# Patient Record
Sex: Female | Born: 1960 | Race: White | Hispanic: No | Marital: Married | Smoking: Current every day smoker
Health system: Southern US, Community
[De-identification: ages and names within clinical notes are randomized; demographics above are authoritative.]

## PROBLEM LIST (undated history)

## (undated) ENCOUNTER — Emergency Department (HOSPITAL_COMMUNITY)

## (undated) DIAGNOSIS — M1990 Unspecified osteoarthritis, unspecified site: Secondary | ICD-10-CM

## (undated) DIAGNOSIS — I2510 Atherosclerotic heart disease of native coronary artery without angina pectoris: Secondary | ICD-10-CM

## (undated) DIAGNOSIS — J189 Pneumonia, unspecified organism: Secondary | ICD-10-CM

## (undated) DIAGNOSIS — K269 Duodenal ulcer, unspecified as acute or chronic, without hemorrhage or perforation: Secondary | ICD-10-CM

## (undated) DIAGNOSIS — I2699 Other pulmonary embolism without acute cor pulmonale: Secondary | ICD-10-CM

## (undated) DIAGNOSIS — I639 Cerebral infarction, unspecified: Secondary | ICD-10-CM

## (undated) DIAGNOSIS — M359 Systemic involvement of connective tissue, unspecified: Secondary | ICD-10-CM

## (undated) DIAGNOSIS — J449 Chronic obstructive pulmonary disease, unspecified: Secondary | ICD-10-CM

## (undated) DIAGNOSIS — I509 Heart failure, unspecified: Secondary | ICD-10-CM

## (undated) DIAGNOSIS — Z87442 Personal history of urinary calculi: Secondary | ICD-10-CM

## (undated) DIAGNOSIS — K5190 Ulcerative colitis, unspecified, without complications: Secondary | ICD-10-CM

## (undated) DIAGNOSIS — K449 Diaphragmatic hernia without obstruction or gangrene: Secondary | ICD-10-CM

## (undated) DIAGNOSIS — K219 Gastro-esophageal reflux disease without esophagitis: Secondary | ICD-10-CM

## (undated) DIAGNOSIS — R350 Frequency of micturition: Secondary | ICD-10-CM

## (undated) DIAGNOSIS — Z9889 Other specified postprocedural states: Secondary | ICD-10-CM

## (undated) HISTORY — DX: Personal history of urinary calculi: Z87.442

## (undated) HISTORY — DX: Cerebral infarction, unspecified: I63.9

## (undated) HISTORY — PX: APPENDECTOMY: SHX54

## (undated) HISTORY — DX: Diaphragmatic hernia without obstruction or gangrene: K44.9

## (undated) HISTORY — PX: CHOLECYSTECTOMY: SHX55

## (undated) HISTORY — PX: TONSILLECTOMY: SUR1361

## (undated) HISTORY — DX: Other specified postprocedural states: Z98.890

## (undated) HISTORY — DX: Other pulmonary embolism without acute cor pulmonale: I26.99

## (undated) HISTORY — PX: SPINE SURGERY: SHX786

## (undated) HISTORY — DX: Frequency of micturition: R35.0

## (undated) HISTORY — PX: TUBAL LIGATION: SHX77

## (undated) HISTORY — PX: ABDOMINAL HYSTERECTOMY: SHX81

## (undated) HISTORY — PX: OTHER SURGICAL HISTORY: SHX169

## (undated) HISTORY — DX: Atherosclerotic heart disease of native coronary artery without angina pectoris: I25.10

## (undated) HISTORY — DX: Duodenal ulcer, unspecified as acute or chronic, without hemorrhage or perforation: K26.9

---

## 1983-07-02 HISTORY — PX: ABDOMINAL HYSTERECTOMY: SHX81

## 1984-07-01 HISTORY — PX: TONSILLECTOMY: SUR1361

## 1990-07-01 DIAGNOSIS — I639 Cerebral infarction, unspecified: Secondary | ICD-10-CM

## 1990-07-01 HISTORY — DX: Cerebral infarction, unspecified: I63.9

## 1997-07-01 DIAGNOSIS — I2699 Other pulmonary embolism without acute cor pulmonale: Secondary | ICD-10-CM

## 1997-07-01 HISTORY — DX: Other pulmonary embolism without acute cor pulmonale: I26.99

## 1999-07-02 HISTORY — PX: CORONARY ANGIOPLASTY WITH STENT PLACEMENT: SHX49

## 2000-06-19 ENCOUNTER — Inpatient Hospital Stay (HOSPITAL_COMMUNITY): Admission: RE | Admit: 2000-06-19 | Discharge: 2000-06-20 | Payer: Self-pay | Admitting: Cardiology

## 2000-06-20 ENCOUNTER — Encounter: Payer: Self-pay | Admitting: Cardiology

## 2000-06-26 ENCOUNTER — Ambulatory Visit (HOSPITAL_COMMUNITY): Admission: RE | Admit: 2000-06-26 | Discharge: 2000-06-26 | Payer: Self-pay | Admitting: *Deleted

## 2003-07-02 HISTORY — PX: COLONOSCOPY: SHX174

## 2004-05-09 ENCOUNTER — Ambulatory Visit: Payer: Self-pay | Admitting: Urgent Care

## 2004-05-10 ENCOUNTER — Ambulatory Visit (HOSPITAL_COMMUNITY): Admission: RE | Admit: 2004-05-10 | Discharge: 2004-05-10 | Payer: Self-pay | Admitting: Internal Medicine

## 2004-05-16 ENCOUNTER — Ambulatory Visit (HOSPITAL_COMMUNITY): Admission: RE | Admit: 2004-05-16 | Discharge: 2004-05-16 | Payer: Self-pay | Admitting: Internal Medicine

## 2004-05-16 ENCOUNTER — Ambulatory Visit: Payer: Self-pay | Admitting: Internal Medicine

## 2004-08-07 ENCOUNTER — Ambulatory Visit: Payer: Self-pay | Admitting: Internal Medicine

## 2004-08-08 ENCOUNTER — Ambulatory Visit (HOSPITAL_COMMUNITY): Admission: RE | Admit: 2004-08-08 | Discharge: 2004-08-08 | Payer: Self-pay | Admitting: Internal Medicine

## 2004-08-21 ENCOUNTER — Ambulatory Visit (HOSPITAL_COMMUNITY): Admission: RE | Admit: 2004-08-21 | Discharge: 2004-08-21 | Payer: Self-pay | Admitting: Internal Medicine

## 2005-05-05 ENCOUNTER — Ambulatory Visit: Payer: Self-pay | Admitting: Cardiology

## 2005-05-05 ENCOUNTER — Inpatient Hospital Stay (HOSPITAL_COMMUNITY): Admission: AD | Admit: 2005-05-05 | Discharge: 2005-05-06 | Payer: Self-pay | Admitting: Cardiology

## 2006-01-29 HISTORY — PX: COLONOSCOPY: SHX174

## 2006-02-04 ENCOUNTER — Ambulatory Visit: Payer: Self-pay | Admitting: Internal Medicine

## 2006-02-10 ENCOUNTER — Ambulatory Visit (HOSPITAL_COMMUNITY): Admission: RE | Admit: 2006-02-10 | Discharge: 2006-02-10 | Payer: Self-pay | Admitting: Internal Medicine

## 2006-02-10 ENCOUNTER — Encounter (INDEPENDENT_AMBULATORY_CARE_PROVIDER_SITE_OTHER): Payer: Self-pay | Admitting: *Deleted

## 2006-02-10 ENCOUNTER — Ambulatory Visit: Payer: Self-pay | Admitting: Internal Medicine

## 2006-03-01 HISTORY — PX: ESOPHAGOGASTRODUODENOSCOPY: SHX1529

## 2006-03-11 ENCOUNTER — Ambulatory Visit: Payer: Self-pay | Admitting: Internal Medicine

## 2006-03-24 ENCOUNTER — Ambulatory Visit (HOSPITAL_COMMUNITY): Admission: RE | Admit: 2006-03-24 | Discharge: 2006-03-24 | Payer: Self-pay | Admitting: Internal Medicine

## 2006-03-24 ENCOUNTER — Encounter (INDEPENDENT_AMBULATORY_CARE_PROVIDER_SITE_OTHER): Payer: Self-pay | Admitting: *Deleted

## 2006-03-24 ENCOUNTER — Ambulatory Visit: Payer: Self-pay | Admitting: Internal Medicine

## 2006-04-01 ENCOUNTER — Emergency Department (HOSPITAL_COMMUNITY): Admission: EM | Admit: 2006-04-01 | Discharge: 2006-04-01 | Payer: Self-pay | Admitting: Emergency Medicine

## 2006-10-01 ENCOUNTER — Ambulatory Visit (HOSPITAL_COMMUNITY): Admission: RE | Admit: 2006-10-01 | Discharge: 2006-10-01 | Payer: Self-pay | Admitting: *Deleted

## 2007-11-17 ENCOUNTER — Ambulatory Visit (HOSPITAL_COMMUNITY): Admission: RE | Admit: 2007-11-17 | Discharge: 2007-11-17 | Payer: Self-pay | Admitting: Urology

## 2007-12-17 ENCOUNTER — Ambulatory Visit: Payer: Self-pay | Admitting: Internal Medicine

## 2008-01-14 ENCOUNTER — Ambulatory Visit: Payer: Self-pay | Admitting: Internal Medicine

## 2008-01-28 ENCOUNTER — Ambulatory Visit: Payer: Self-pay | Admitting: Internal Medicine

## 2008-08-30 ENCOUNTER — Ambulatory Visit: Payer: Self-pay | Admitting: Internal Medicine

## 2008-08-30 ENCOUNTER — Inpatient Hospital Stay (HOSPITAL_COMMUNITY): Admission: AD | Admit: 2008-08-30 | Discharge: 2008-09-01 | Payer: Self-pay | Admitting: Internal Medicine

## 2008-08-31 ENCOUNTER — Encounter: Payer: Self-pay | Admitting: Internal Medicine

## 2008-09-09 ENCOUNTER — Ambulatory Visit: Payer: Self-pay | Admitting: Cardiology

## 2009-07-01 HISTORY — PX: CORONARY ANGIOPLASTY WITH STENT PLACEMENT: SHX49

## 2009-09-15 DIAGNOSIS — R1110 Vomiting, unspecified: Secondary | ICD-10-CM

## 2009-09-15 DIAGNOSIS — R350 Frequency of micturition: Secondary | ICD-10-CM

## 2009-09-15 DIAGNOSIS — F17200 Nicotine dependence, unspecified, uncomplicated: Secondary | ICD-10-CM

## 2009-09-15 DIAGNOSIS — R634 Abnormal weight loss: Secondary | ICD-10-CM

## 2009-09-15 DIAGNOSIS — I6350 Cerebral infarction due to unspecified occlusion or stenosis of unspecified cerebral artery: Secondary | ICD-10-CM | POA: Insufficient documentation

## 2009-09-15 DIAGNOSIS — Z86718 Personal history of other venous thrombosis and embolism: Secondary | ICD-10-CM | POA: Insufficient documentation

## 2009-09-15 DIAGNOSIS — Z8719 Personal history of other diseases of the digestive system: Secondary | ICD-10-CM

## 2009-09-15 DIAGNOSIS — Z87442 Personal history of urinary calculi: Secondary | ICD-10-CM | POA: Insufficient documentation

## 2009-09-15 DIAGNOSIS — R109 Unspecified abdominal pain: Secondary | ICD-10-CM

## 2009-09-15 DIAGNOSIS — R112 Nausea with vomiting, unspecified: Secondary | ICD-10-CM | POA: Insufficient documentation

## 2009-09-15 DIAGNOSIS — F17210 Nicotine dependence, cigarettes, uncomplicated: Secondary | ICD-10-CM | POA: Insufficient documentation

## 2009-09-15 DIAGNOSIS — I2510 Atherosclerotic heart disease of native coronary artery without angina pectoris: Secondary | ICD-10-CM

## 2009-09-15 DIAGNOSIS — G43909 Migraine, unspecified, not intractable, without status migrainosus: Secondary | ICD-10-CM | POA: Insufficient documentation

## 2009-09-18 ENCOUNTER — Ambulatory Visit: Payer: Self-pay | Admitting: Internal Medicine

## 2009-09-18 ENCOUNTER — Encounter: Payer: Self-pay | Admitting: Gastroenterology

## 2009-09-18 DIAGNOSIS — R1013 Epigastric pain: Secondary | ICD-10-CM

## 2009-09-18 DIAGNOSIS — R1310 Dysphagia, unspecified: Secondary | ICD-10-CM | POA: Insufficient documentation

## 2009-09-18 DIAGNOSIS — K219 Gastro-esophageal reflux disease without esophagitis: Secondary | ICD-10-CM | POA: Insufficient documentation

## 2009-09-19 ENCOUNTER — Ambulatory Visit: Payer: Self-pay | Admitting: Internal Medicine

## 2009-09-19 ENCOUNTER — Ambulatory Visit (HOSPITAL_COMMUNITY): Admission: RE | Admit: 2009-09-19 | Discharge: 2009-09-19 | Payer: Self-pay | Admitting: Internal Medicine

## 2009-09-19 DIAGNOSIS — K269 Duodenal ulcer, unspecified as acute or chronic, without hemorrhage or perforation: Secondary | ICD-10-CM

## 2009-09-19 HISTORY — PX: ESOPHAGOGASTRODUODENOSCOPY: SHX1529

## 2009-09-19 HISTORY — DX: Duodenal ulcer, unspecified as acute or chronic, without hemorrhage or perforation: K26.9

## 2009-09-19 LAB — CONVERTED CEMR LAB
ALT: 11 units/L (ref 0–35)
AST: 14 units/L (ref 0–37)
Albumin: 4 g/dL (ref 3.5–5.2)
Alkaline Phosphatase: 61 units/L (ref 39–117)
Basophils Absolute: 0.1 10*3/uL (ref 0.0–0.1)
Calcium: 9.4 mg/dL (ref 8.4–10.5)
Chloride: 99 meq/L (ref 96–112)
Lymphocytes Relative: 26 % (ref 12–46)
Lymphs Abs: 2.7 10*3/uL (ref 0.7–4.0)
Neutro Abs: 6.7 10*3/uL (ref 1.7–7.7)
Neutrophils Relative %: 65 % (ref 43–77)
Platelets: 326 10*3/uL (ref 150–400)
Potassium: 4.1 meq/L (ref 3.5–5.3)
RDW: 12.8 % (ref 11.5–15.5)
WBC: 10.3 10*3/uL (ref 4.0–10.5)

## 2010-06-27 ENCOUNTER — Emergency Department (HOSPITAL_COMMUNITY)
Admission: EM | Admit: 2010-06-27 | Discharge: 2010-06-27 | Payer: Self-pay | Source: Home / Self Care | Admitting: Emergency Medicine

## 2010-07-21 ENCOUNTER — Encounter: Payer: Self-pay | Admitting: Internal Medicine

## 2010-07-22 ENCOUNTER — Encounter: Payer: Self-pay | Admitting: Urology

## 2010-07-31 NOTE — Assessment & Plan Note (Signed)
Summary: VOMITING,CANT HOLD FOOD DOWN,LOSSING WT/SS   Visit Type:  f/u Primary Care Provider:  Selinda Flavin, MD  Chief Complaint:  vomiting, worse last 2 weeks, and wt loss.  History of Present Illness: [name] [name] is here for further evaluation of refractory vomiting and weight loss. She was last seen in 7/09 for f/u constipation.  She states that she has chronic intermittent vomiting about 3-4 times per week for several years. Over the last 2 weeks however she's been vomiting every hour to 2 hours. She states she wakes up every hour or so and vomits. She is having dry heaves at this point. She's had no solid foods over a week. She complains of epigastric pain since the vomiting began. She also has significant heartburn symptoms for the last few weeks. She complains of some dysphagia, however quite vague in her description. She believe her symptoms were brought on by her virus that she had a couple weeks ago. She had close contact with others around her who have been sick. She denies any significant psychosocial factors. Despite all the vomiting she states she continues to urinate frequently. She denies any dysuria or hematuria. She has chronic constipation but was having one to 2 bowel movements a week until she started vomiting 2 weeks ago. She is not consuming any solid foods and is having very scant amount of bowel movements, the last one was today. She denies any blood in the stool or melena. She denies any bloody emesis. Her weight is down from 125 her report, to 103 pounds today. We saw her last in July 2009 she weighed 122 pounds.     Current Medications (verified): 1)  Miralax  Powd (Polyethylene Glycol 3350) .... Once Daily As Needed 2)  Aspirin 81 Mg Tabs (Aspirin) .... One By Mouth Daily 3)  Corrective Laxative 5 Mg Tbec (Bisacodyl) .... Two By Mouth As Needed, Usually Once Per Month  Allergies (verified): 1)  ! Vicodin  Past History:  Past Medical History: 1. She has history of CAD,  status post cath with stent in 2001. 2. CVA in 1992. 3. Pulmonary embolus in 1999. 4. Migraine headaches. 5. Urinary frequency. 6. History of kidney stones. 7. EGD, September 2007, normal except for small hiatal hernia.  She underwent small bowel biopsy with history of diarrhea at that time.  This was negative.   8. Colonoscopy, August 2007.  She had a pedunculated polyp at 30 cm, which was hamartomatous polyp.  She is due for 5-year followup with family history of colon cancer in a brother at age 82.   73. Colonoscopy, 2005 revealed a linear ulcer with scar versus inflammatory process at the mid right colon, but normal terminal ileum.  A biopsy of this area revealed an ulceration, but nonspecific.  10. CT angiogram in 2007, which was negative with normal mesenteric arteries.   11. Small bowel follow through, which was normal.    Past Surgical History: CESAREAN SECTION APPENDECTOMY UNILATERAL OOPHORECTOMY WITH HYSTERECTOMY Tonsillectomy  Family History: Brother diagnosed with colon cancer at age 71, died 1 year later.  Mother is 9, has MI.  Father died due to an accidental drowning at age 53.  She has a sister with diabetes.     Social History: She is married.  She has 2 children.  She is employed with [employer].  She smokes half a pack of cigarettes a day.  No alcohol use.   Review of Systems General:  Complains of fatigue, weakness, and weight  loss; denies fever, chills, sweats, and anorexia. Eyes:  Denies vision loss. ENT:  Complains of difficulty swallowing; denies nasal congestion, sore throat, and hoarseness. CV:  Denies chest pains, angina, palpitations, dyspnea on exertion, and peripheral edema. Resp:  Complains of cough; denies dyspnea at rest, dyspnea with exercise, sputum, and wheezing. GI:  See HPI. GU:  Denies urinary burning and blood in urine. MS:  Denies joint pain / LOM. Derm:  Denies rash and itching. Neuro:  Denies weakness, paralysis,  frequent headaches, memory loss, and confusion. Psych:  Denies depression and anxiety. Endo:  Complains of unusual weight change. Heme:  Denies bruising and bleeding. Allergy:  Denies hives and rash.  Vital Signs:  Patient profile:   50 year old female Height:      64 inches Weight:      103 pounds BMI:     17.74 Temp:     98.5 degrees F oral Pulse rate:   60 / minute Pulse (ortho):   72 / minute BP sitting:   120 / 78  (left arm) BP standing:   120 / 80 Cuff size:   regular  Vitals Entered By: Hendricks Limes LPN (September 18, 2009 8:21 AM)  Serial Vital Signs/Assessments:  Time      Position  BP       Pulse  Resp  Temp     By 9:08 AM   Lying RA  128/80   60                    Leanna Battles. Torrez Renfroe PA-C 9:08 AM   Sitting   122/76   64                    Leanna Battles. Bryssa Tones PA-C 9:08 AM   Standing  120/80   72                    Leanna Battles. Dixon Boos   Physical Exam  General:  Thin, WF in NAD. Appears older than stated age. Head:  Normocephalic and atraumatic. Eyes:  Conjunctivae pink, no scleral icterus.  Mouth:  Oropharyngeal mucosa moist, pink.  No lesions, erythema or exudate.    Neck:  Supple; no masses or thyromegaly. Lungs:  Clear throughout to auscultation. Heart:  Regular rate and rhythm; no murmurs, rubs,  or bruits. Abdomen:  Flat. Positive bowel sounds. No abd bruit or hernia. No HSM or masses. Mild epigastric tenderness. No rebound or guarding.  Extremities:  No clubbing, cyanosis, edema or deformities noted. Neurologic:  Alert and  oriented x4;  grossly normal neurologically. Skin:  Intact without significant lesions or rashes. Cervical Nodes:  No significant cervical adenopathy. Psych:  Alert and cooperative. Normal mood and affect.  Impression & Recommendations:  Problem # 1:  VOMITING (ICD-787.03) Refractory vomiting, 2 weeks duration. Associated 20 pound weight loss, patient reports in last couple weeks. At least well documented weight loss since she was here in 2009.  She complains of chronic intermittent vomiting several times a week as well. Etiology not well defined. Differential diagnosis includes peptic ulcer disease, refractory GERD, gastroparesis, biliary etiology. We'll check some labs. EGD as soon as possible. If unremarkable, consider abdominal ultrasound was next step. EGD to be performed in near future.  Risks, alternatives, benefits including but not limited to risk of reaction to medications, bleeding, infection, and perforation addressed.  Patient voiced understanding and verbal consent obtained.  Please see pt instructions sheet for further  plan.  Orders: Est. Patient Level III (60454) T-CBC w/Diff (09811-91478) T-Comprehensive Metabolic Panel (29562-13086) T-TSH (57846-96295)  Problem # 2:  DYSPHAGIA UNSPECIFIED (ICD-787.20) Vague symptoms, possibly related to refractory GERD or esophagitis. EGD is planned. Orders: Est. Patient Level III (28413)  Problem # 3:  GERD (ICD-530.81) Typical heartburn symptoms in the setting of refractory vomiting.  Add Nexium 40 mg daily. Samples provided. Orders: Est. Patient Level III (24401) T-CBC w/Diff (02725-36644) T-Comprehensive Metabolic Panel (03474-25956) T-TSH (38756-43329)  Problem # 4:  CONSTIPATION, CHRONIC, HX OF (ICD-V12.79) Doing well prior to last two weeks. Taking miralax as needed and correctol rarely.   Problem # 5:  ADENOCARCINOMA, COLON, FAMILY HX (ICD-V16.0) Due next colonoscopy in 03/2011.   Patient Instructions: 1)  Please have bloodwork done today. 2)  Upper endoscopy as scheduled. 3)  Try to drink no more than one tablespoon of liquid every 15 minutes. Drinking more may cause further vomiting. Generally if you are able to keep down one tbsp of liquid every 15 mins, this is enough to stay hydrated.  4)  Take Nexium, one by mouth every morning. Samples provided. 5)  Please use Zofran and Phenergan as needed for n/v, try to alternate. 6)  The medication list was reviewed and  reconciled.  All changed / newly prescribed medications were explained.  A complete medication list was provided to the patient / caregiver. Prescriptions: PROMETHAZINE HCL 25 MG TABS (PROMETHAZINE HCL) one by mouth every 4-6 hours as needed n/v  #20 x 0   Entered and Authorized by:   Leanna Battles. Dixon Boos   Signed by:   Leanna Battles Keiston Manley PA-C on 09/18/2009   Method used:   Print then Give to Patient   RxID:   [id number] ZOFRAN ODT 4 MG TBDP (ONDANSETRON) one SL every 4-6 hours as needed n/v  #20 x 0   Entered and Authorized by:   Leanna Battles. Dixon Boos   Signed by:   Leanna Battles Hershey Knauer PA-C on 09/18/2009   Method used:   Print then Give to Patient   RxID:   [id number]

## 2010-07-31 NOTE — Letter (Signed)
Summary: EGD ORDER  EGD ORDER   Imported By: Ave Filter 09/18/2009 09:10:57  _____________________________________________________________________  External Attachment:    Type:   Image     Comment:   External Document

## 2010-09-10 LAB — URINE MICROSCOPIC-ADD ON

## 2010-09-10 LAB — CBC
MCV: 84.7 fL (ref 78.0–100.0)
Platelets: 313 10*3/uL (ref 150–400)
RBC: 5.28 MIL/uL — ABNORMAL HIGH (ref 3.87–5.11)
RDW: 13.1 % (ref 11.5–15.5)
WBC: 16.1 10*3/uL — ABNORMAL HIGH (ref 4.0–10.5)

## 2010-09-10 LAB — COMPREHENSIVE METABOLIC PANEL
ALT: 13 U/L (ref 0–35)
Alkaline Phosphatase: 74 U/L (ref 39–117)
BUN: 10 mg/dL (ref 6–23)
CO2: 25 mEq/L (ref 19–32)
GFR calc non Af Amer: >60 mL/min (ref >60)
Glucose, Bld: 106 mg/dL — ABNORMAL HIGH (ref 70–99)
Potassium: 3.3 mEq/L — ABNORMAL LOW (ref 3.5–5.1)
Sodium: 137 mEq/L (ref 135–145)
Total Protein: 7.5 g/dL (ref 6.0–8.3)

## 2010-09-10 LAB — DIFFERENTIAL
Basophils Absolute: 0 10*3/uL (ref 0.0–0.1)
Lymphocytes Relative: 13 % (ref 12–46)
Lymphs Abs: 2.1 10*3/uL (ref 0.7–4.0)
Neutro Abs: 12.1 10*3/uL — ABNORMAL HIGH (ref 1.7–7.7)
Neutrophils Relative %: 76 % (ref 43–77)

## 2010-09-10 LAB — URINALYSIS, ROUTINE W REFLEX MICROSCOPIC
Glucose, UA: NEGATIVE mg/dL
Ketones, ur: 15 mg/dL — AB
Leukocytes, UA: NEGATIVE
Nitrite: NEGATIVE
Specific Gravity, Urine: 1.01 (ref 1.005–1.030)
pH: 6.5 (ref 5.0–8.0)

## 2010-10-11 LAB — DIFFERENTIAL
Basophils Absolute: 0 10*3/uL (ref 0.0–0.1)
Basophils Absolute: 0.1 10*3/uL (ref 0.0–0.1)
Eosinophils Relative: 2 % (ref 0–5)
Eosinophils Relative: 5 % (ref 0–5)
Lymphocytes Relative: 32 % (ref 12–46)
Lymphocytes Relative: 40 % (ref 12–46)
Lymphs Abs: 3.4 10*3/uL (ref 0.7–4.0)
Neutro Abs: 6.2 10*3/uL (ref 1.7–7.7)
Neutrophils Relative %: 45 % (ref 43–77)
Neutrophils Relative %: 59 % (ref 43–77)

## 2010-10-11 LAB — CBC
HCT: 35.8 % — ABNORMAL LOW (ref 36.0–46.0)
Hemoglobin: 14.8 g/dL (ref 12.0–15.0)
MCV: 95.2 fL (ref 78.0–100.0)
Platelets: 208 10*3/uL (ref 150–400)
Platelets: 255 10*3/uL (ref 150–400)
RBC: 4.55 MIL/uL (ref 3.87–5.11)
RDW: 13.8 % (ref 11.5–15.5)
RDW: 14.1 % (ref 11.5–15.5)
WBC: 10.3 10*3/uL (ref 4.0–10.5)
WBC: 10.8 10*3/uL — ABNORMAL HIGH (ref 4.0–10.5)

## 2010-10-11 LAB — COMPREHENSIVE METABOLIC PANEL
ALT: 11 U/L (ref 0–35)
AST: 18 U/L (ref 0–37)
CO2: 29 mEq/L (ref 19–32)
Chloride: 102 mEq/L (ref 96–112)
Creatinine, Ser: 0.59 mg/dL (ref 0.4–1.2)
GFR calc Af Amer: >60 mL/min (ref >60)
GFR calc non Af Amer: >60 mL/min (ref >60)
Glucose, Bld: 107 mg/dL — ABNORMAL HIGH (ref 70–99)
Total Bilirubin: 0.2 mg/dL — ABNORMAL LOW (ref 0.3–1.2)

## 2010-10-11 LAB — BRAIN NATRIURETIC PEPTIDE: Pro B Natriuretic peptide (BNP): <30 pg/mL (ref 0.0–100.0)

## 2010-10-11 LAB — BASIC METABOLIC PANEL
BUN: 9 mg/dL (ref 6–23)
BUN: 9 mg/dL (ref 6–23)
Calcium: 8.7 mg/dL (ref 8.4–10.5)
Calcium: 9.1 mg/dL (ref 8.4–10.5)
Creatinine, Ser: 0.59 mg/dL (ref 0.4–1.2)
Creatinine, Ser: 0.64 mg/dL (ref 0.4–1.2)
GFR calc non Af Amer: >60 mL/min (ref >60)
GFR calc non Af Amer: >60 mL/min (ref >60)
Glucose, Bld: 86 mg/dL (ref 70–99)
Glucose, Bld: 96 mg/dL (ref 70–99)
Potassium: 3.6 mEq/L (ref 3.5–5.1)

## 2010-10-11 LAB — CARDIAC PANEL(CRET KIN+CKTOT+MB+TROPI)
CK, MB: 0.6 ng/mL (ref 0.3–4.0)
CK, MB: 0.7 ng/mL (ref 0.3–4.0)
Relative Index: INVALID (ref 0.0–2.5)
Relative Index: INVALID (ref 0.0–2.5)
Relative Index: INVALID (ref 0.0–2.5)
Total CK: 25 U/L (ref 7–177)
Total CK: 28 U/L (ref 7–177)
Troponin I: <0.01 ng/mL (ref 0.00–0.06)
Troponin I: <0.01 ng/mL (ref 0.00–0.06)

## 2010-10-11 LAB — HEPARIN LEVEL (UNFRACTIONATED): Heparin Unfractionated: 0.19 IU/mL — ABNORMAL LOW (ref 0.30–0.70)

## 2010-10-11 LAB — LIPID PANEL: VLDL: 21 mg/dL (ref 0–40)

## 2010-10-11 LAB — PROTIME-INR: Prothrombin Time: 14 seconds (ref 11.6–15.2)

## 2010-10-11 LAB — TSH: TSH: 0.972 u[IU]/mL (ref 0.350–4.500)

## 2010-10-11 LAB — D-DIMER, QUANTITATIVE: D-Dimer, Quant: 0.33 ug/mL-FEU (ref 0.00–0.48)

## 2010-11-13 NOTE — Discharge Summary (Signed)
NAMEPEYTON, ROSSNER               ACCOUNT NO.:  [id number]   MEDICAL RECORD NO.:  [id number]          PATIENT TYPE:  INP   LOCATION:  2012                         FACILITY:  [facility]   PHYSICIAN:  Noralyn Pick. Eden Emms, MD, FACCDATE OF BIRTH:  Nov 05, 1960   DATE OF ADMISSION:  08/30/2008  DATE OF DISCHARGE:  09/01/2008                               DISCHARGE SUMMARY   PRIMARY CARE Eulalio Reamy:  Dr. Selinda Flavin.   PRIMARY CARDIOLOGIST:  Will be in our [place] office.   DISCHARGE DIAGNOSIS:  Chest pain.   SECONDARY DIAGNOSES:  1. Coronary artery disease status post previous myocardial infarction      with stenting of the left circumflex.  2. Ongoing tobacco abuse.  3. Migraine headaches.  4. Hyperlipidemia.  5. Status post tonsillectomy.  6. Status post Cesarean section.  7. Depression.  8. Questionable history of stroke in 1992.  9. Questionable history of pulmonary embolism in the 1990s.  10.Status post hysterectomy.   ALLERGIES:  NO KNOWN DRUG ALLERGIES.   PROCEDURES:  1. Left heart cardiac catheterization revealing patency of previously      placed left circumflex stent with otherwise nonobstructive disease      and normal LV function with an EF of 55%.  2. A 2D echocardiogram revealing EF of 55% - 65% without effusion.   HISTORY OF PRESENT ILLNESS:  A 50 year old female with a prior history  of CAD status post MI in 2001 treated [facility] in [place]  with bare metal stenting of the left circumflex.  She has ongoing  tobacco abuse and hyperlipidemia as well as GERD and medical  noncompliance.  She has had no regular cardiology follow-up.  She was in  usual state of health until approximately 4 days prior to admission when  she began to experience significant fatigue and intermittent chest pain  and stabbing sensation associated with diaphoresis, nausea and emesis.  She eventually presented to [facility] where ECG showed  nonspecific changes and troponins were  negative.  Decision was made to  transfer her [facility] for further evaluation and catheterization.   HOSPITAL COURSE:  The patient underwent left heart rate catheterization  on March 3 revealing nonobstructive coronary artery disease with patent  left circumflex stent.  LV function was normal.  A follow-up  echocardiogram showed normal LV function without evidence of effusion.  The patient was initiated on PPI therapy and given her history of GERD  we have recommended outpatient GI evaluation.  Plan to discharge her  home today in good condition.  We have asked her to follow up with Dr.  Dimas Aguas with regard to additional GI care.   DISCHARGE LABS:  Hemoglobin 12.5, hematocrit 35.8, WBC 5.9, platelets  208, MCV 95.2, sodium 142, potassium 3.6, chloride 107, CO2 - 27, BUN 9,  creatinine 0.64, glucose 86, total bilirubin 0.2, alkaline phosphatase  72, AST 18, ALT 11, albumin 4.  Cardiac markers negative x3.  Total  cholesterol 204, triglycerides 104, HDL 36, LDL 147, calcium 9.1, TSH  0.927, D-dimer was normal at 0.33.   DISPOSITION:  The patient is being  discharged home today in good  condition.   FOLLOWUP PLANS AND APPOINTMENTS:  The patient will follow up with Dr.  Jens Som or Rozell Searing, PA in our [place] office on March 12 at 2:00 p.m.  We have asked him to follow up with Dr. Dimas Aguas in the next 1 - 2 weeks  for additional evaluation of noncardiac chest pain.   DISCHARGE MEDICATIONS:  1. Aspirin 81mg  daily.  2. Protonix 40 mg daily.  3. Folic acid 1 mg daily.  4. Advair 50/250 one puff b.i.d.  5. Albuterol MDI 1 - 2 puffs q. 4 hours p.r.n.  6. Simvastatin 40 mg nightly.  7. Nitroglycerin 0.4 mg sublingual p.r.n. chest pain.   The patient has been counseled on the importance of smoking cessation.  Outstanding labs and studies:  None.  Duration of discharge encounter 40  minutes including physician time.      Nicolasa Ducking, ANP      Noralyn Pick. Eden Emms, MD, [facility]   Electronically Signed    CB/MEDQ  D:  09/01/2008  T:  09/01/2008  Job:  [id number]   cc:   Selinda Flavin

## 2010-11-13 NOTE — Assessment & Plan Note (Signed)
[name] [name], [name] [name]                CHART#:  [id number]   DATE:  01/14/2008                       DOB:  1960-09-28   Followup left lower quadrant abdominal pain in the setting of  constipation previously in consultation request by Dr. Emelda Fear,  12/17/2007.  She also has a history of kidney stones previously, but  negative scan most recently at [facility].  She tells me today  she has not had a bowel movement in 3 weeks, although she has been  eating, she feels bloated.  She has lost 5 pounds since she was last  seen.  Her MCV was elevated and her CBC (101.8), 12/22/2007.  Creatinine  was normal and TSH normal.  B12 and folate came back at 513 and 3.0  respectively.  She is on folate supplement 1 mg daily.  She has been on  Percocet and more recently Darvocet.   Detrol LA 4 mg was started 1 month ago for urinary urgency.  She has  also been on dicyclomine 10 mg a.c. and at bedtime, and has also been on  hydrocodone.  She tells me she has urge to have a bowel movement, not  infrequently, but she has trouble having a movement.   CURRENT MEDICATION:  See updated list.   ALLERGIES:  Vicodin.   PHYSICAL EXAMINATION:  VITALS SIGNS:  Appears not feeling well, but not  toxic or acutely ill, accompanied by her husband.  Weight is down 5  pounds from 12/17/2007, weight 117, height 5 feet 4, temperature 98.5,  BP 108/64, pulse of 80.  SKIN:  Warm and dry.  CHEST:  Lungs clear to auscultation.  CARDIAC:  Regular rate and rhythm without murmur, gallop, or rub.  ABDOMEN:  Nondistended, positive bowel sounds, soft.  Does have some  left lower quadrant tenderness to palpation.  No appreciable mass or  organomegaly.  RECTAL:  Very hard stool in the proximal rectal vault.  No mass.  Stool  is brown.  Hemoccult negative.   ASSESSMENT:  Acute on chronic constipation, left-sided abdominal pain  likely related to constipation.  Medication effect no doubt playing a  role in this setting and  is likely overall contributing to her weight  loss and not feeling well.   RECOMMENDATIONS:  1. Stop HyoMax, stop dicyclomine.  She is to avoid hydrocodone and      Darvocet.  She is to continue Detrol LA 4 mg daily, HalfLytely      colonic purge this afternoon, subsequently begin MiraLax 17 grams      orally at bedtime beginning tomorrow.  Continue Colace stool      softener.  She is to keep a stools diary.  2. I plan to see this nice lady back in 2 weeks.  If she does not      appreciate a good colonic purge in the next 24 hours, she is to let      [place] know.       Jonathon Bellows, M.D.  Electronically Signed     RMR/MEDQ  D:  01/14/2008  T:  01/14/2008  Job:  [id number]   cc:   Dala Dock, M.D.

## 2010-11-13 NOTE — Assessment & Plan Note (Signed)
[name], [name]                CHART#:  [id number]   DATE:  12/17/2007                       DOB:  [birth year]   REASON FOR CONSULTATION:  Acute on chronic left lower quadrant abdominal  pain.   REQUESTING PHYSICIAN:  Tilda Burrow, M.D.   HISTORY OF PRESENT ILLNESS:  The patient is a 50 year old Caucasian  female who presents today for a 95-month history of worsening left lower  quadrant abdominal pain.  She states, initially, she had severe left  lower quadrant abdominal pain and was noted to have microscopic  hematuria.  She states she passed 5 kidney stones.  She had been  followed by Dr. Jerre Simon.  She has been treated for UTIs as well.  She  states now she has been cleared by Dr. Jerre Simon, but she continues to have  left lower quadrant abdominal pain.  She says she has lost about 18  pounds total.  Over the last few weeks, she has lost about 7 pounds she  says because she does not feel well.  Pain is not necessarily worsened  with meals.  She states she is in constant agonizing pain and cannot  really determine if anything makes her symptoms worse.  Nothing makes  the pain better.  She takes stool softener daily.  She just started  that.  She has chronic constipation, generally has a bowel movement 2-3  times a week.  Denies any melena or bright red blood per rectum.  She  has had some intermittent vomiting.  She states she just vomits without  any warning.  She really denies any nausea.  No hematemesis.  No  heartburn.  She recently saw Dr. Emelda Fear.  She had rectal exam with  stool being heme-negative.  She had a lot of stool in the upper rectum.  She states she was told that her bladder has fallen and she may need  surgery for that.  She says she had a urinalysis checked, which was  negative for infection currently.   CURRENT MEDICATIONS:  1. Colace 2 daily.  2. Detrol LA 4 mg daily, just started.  3. Vicodin 7.5/750 mg daily, but she states she cannot tolerate due to      nausea and vomiting.  4. Aspirin 81 mg daily.   ALLERGIES:  Vicodin and Codeine causes nausea and vomiting.   PAST MEDICAL HISTORY:  1. She has history of CAD, status post cath with stent in 2001.  2. CVA in 1992.  3. Pulmonary embolus in 1999.  4. Migraine headaches.  5. Urinary frequency.  6. History of kidney stones.  7. Cesarean section.  8. Appendectomy.  9. Unilateral oophorectomy with hysterectomy.  10.Tonsillectomy.   Her last EGD was in September 2007, it was normal except for small  hiatal hernia.  She underwent small bowel biopsy with history of  diarrhea at that time.  This was negative.  Her last colonoscopy was in  August 2007.  She had a pedunculated polyp at 30 cm, which was  hamartomatous polyp.  She is due for 5-year followup with family history  of colon cancer in a brother at age 71.  She had a CT angiogram in 2007,  which was negative with normal mesenteric arteries.  She had a prior  small bowel follow through, which was normal.  A  colonoscopy back in  2005 revealed a linear ulcer with scar versus inflammatory process at  the mid right colon, but normal terminal ileum.  A biopsy of this area  revealed an ulceration, but nonspecific.   FAMILY HISTORY:  Brother diagnosed with colon cancer at age 30, died 1  year later.  Mother is 60, has MI.  Father died due to an accidental  drowning at age 73.  She has a sister with diabetes.   SOCIAL HISTORY:  She is married.  She has 2 children.  She is employed  with [employer].  She smokes half a pack of cigarettes  a day.  No alcohol use.   REVIEW OF SYSTEMS:  See HPI for GI.  CONSTITUTIONAL:  Weight loss as  above.  CARDIOPULMONARY:  No chest pain or shortness of breath.  GENITOURINARY:  Urinary frequency.  No dysuria.  No gross hematuria with  a history of microscopic hematuria.   PHYSICAL EXAMINATION:  VITAL SIGNS:  Weight 122, height 5 feet 4,  temperature 98.4, blood pressure 104/68, and  pulse 80.  GENERAL:  A pleasant well-nourished, well-developed Caucasian female in  no acute distress.  SKIN:  Warm and dry.  No jaundice.  HEENT:  Sclerae nonicteric.  Oropharyngeal, mucosa moist and pink.  No  lesions, erythema, or exudate.  NECK:  No lymphadenopathy or thyromegaly.  CHEST:  Lungs are clear to auscultation.  CARDIAC:  Regular rate and rhythm.  Normal S1 and S2.  No murmurs, rubs,  or gallops.  ABDOMEN:  Positive bowel sounds.  Abdomen is soft, nondistended.  She  has tenderness primarily in the left lower quadrant region to deep  palpation.  No rebound or guarding.  No organomegaly or masses.  No  abdominal bruits or hernias.  LOWER EXTREMITIES:  No edema.  RECTAL:  Deferred as this was done by Dr. Emelda Fear recently.   X-RAYS:  A CT of abdomen and pelvis without contrast on Nov 17, 2007,  was unremarkable.  The patient states she has had other studies done at  [facility], which we are trying to obtain.   IMPRESSION:  The patient is a 46-year lady with basically acute on  chronic left lower quadrant abdominal pain with intermittent vomiting  and reported weight loss.  Based on our records, she is 12 pounds  lighter than when we saw her 2 years ago.  She describes having  constant, severe left lower quadrant abdominal pain.  History of passing  kidney stones in the recent past, but last CT did not show any evidence  of further stones.  We are in the process of obtaining data on this  patient who has had multiple studies done at [facility].  We will treat her constipation to see if this improves her left lower  quadrant abdominal pain.  Cannot exclude the possibility of pelvic  adhesions contributing to her pain.  She has significant urinary  frequency, which may be related to her cystocele and I leave this up to  Dr. Rayna Sexton discretion for further management.   PLAN:  1. CBC, TSH, sed rate, and creatinine.  2. Retrieve records  from [facility].  3. Trial of HyoMax sublingual up to 4 times a day as needed for      abdominal pain, #60, 0 refills.  4. She will continue Colace 2 daily, just initiated this therapy, if      she does not get adequate results, she will  let me know and we can      change her bowel regimen.  I would like to thank Dr. Emelda Fear for      allowing [place] to take part in care of this patient.       Tana Coast, P.A.  Electronically Signed    ______________________________  Corbin Ade    LL/MEDQ  D:  12/18/2007  T:  12/19/2007  Job:  [id number]   cc:   Tilda Burrow, M.D.  Selinda Flavin

## 2010-11-13 NOTE — H&P (Signed)
NAMESONNET, RIZOR NO.:  [id number]   MEDICAL RECORD NO.:  [id number]          PATIENT TYPE:  INP   LOCATION:  2902                         FACILITY:  [facility]   PHYSICIAN:  Jonelle Sidle, MD DATE OF BIRTH:  June 19, 1961   DATE OF ADMISSION:  08/30/2008  DATE OF DISCHARGE:                              HISTORY & PHYSICAL   PRIMARY CARE PHYSICIAN:  Dr. Selinda Flavin.   REASON FOR ADMISSION:  Unstable angina.   HISTORY OF PRESENT ILLNESS:  [name] [name] is a 50 year old woman with a  history of coronary artery disease status post myocardial infarction  back in 2001, treated in [place] at [facility], reportedly with  a bare metal stent in the circumflex distribution.  Additional problems  include longstanding tobacco abuse, hyperlipidemia on no specific  medical therapy, gastroesophageal reflux disease, and medical  noncompliance with no regular cardiology follow-up.  She was seen at  [facility] back in December 2001, and underwent a cardiac  catheterization by Dr. Corinda Gubler, which demonstrated stent patency within  the circumflex (only 20% in-stent stenosis) and otherwise mild  atherosclerosis within the left anterior descending and right coronary  artery.  Left ventricular ejection fraction was 59% with inferior and  posterolateral hypokinesis, as well as mild mitral regurgitation.  She  was also seen by our service in 2006, during an episode of chest pain,  and ruled out for myocardial infarction at that time.  Those particular  symptoms were associated with cough, nausea and emesis, and we treated  with anti-inflammatory medications.  Based on the discharge summary, she  was supposed to follow-up with [place] in our [place] office although never did.   [name] [name] is now admitted to the intensive care unit in transfer from  [facility] describing the onset of significant fatigue  on Saturday and Sunday.  On Monday late in the afternoon, she  began to  experience a stabbing chest pain, also associated with profuse  diaphoresis, nausea and emesis.  These symptoms waxed and waned, but did  abate at night, only to return this morning.  She ultimately presented  to [facility] emergency department and was noted to have  a nonspecific electrocardiogram with initially normal troponin-I levels.  She was treated with nitroglycerin and morphine and her symptoms have  improved, although not completely resolved.  Follow-up tracings show  continued nonspecific ST-T wave changes without any obvious injury  current.  She is much more comfortable now on examination.  In reviewing  the medical record and also speaking with [name] [name], she has not had any  follow-up ischemic evaluation since 2001.  She continues to smoke  cigarettes.  I note that her lipids were abnormal back in 2006, with an  LDL of 109.   ALLERGIES:  NO KNOWN DRUG ALLERGIES.   MEDICATIONS AT HOME:  1. Aspirin 81 mg p.o. daily.  2. Folic acid 1 mg p.o. daily.  3. Pepcid 20 mg p.o. p.r.n.  4. She has not had to use any sublingual nitroglycerin.   PAST MEDICAL HISTORY:  As outlined above.  Additional problems include  gastroesophageal reflux disease with gastroenterology evaluation by Dr.  Jena Gauss in [place] back in 2007.  She underwent an endoscopy at that  time which demonstrated no major pathology with a patulous  esophagogastric junction and small hiatal hernia.  She had a colonoscopy  done at that time which revealed a pedunculated polyp at 30 cm, which  reportedly showed no major pathology.  The colonic mucosa was described  as normal.  [name] [name] has a history of migraine headaches, previous  tonsillectomy, previous cesarean section, depression, possibly stroke  back in 1992, based on very limited information, and also a possible  pulmonary embolus back in the 1990s, also based on very limited  information.  She is status post hysterectomy and  unilateral  oophorectomy.  She reports a hospital admission at [facility]  with pneumonia approximately 6 months ago.  She also reports sinus  problems.   SOCIAL HISTORY:  The patient is married.  She has a longstanding tobacco  use history, now at 1-1/2 packs per day.  She denies any alcohol use.  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   FAMILY HISTORY:  Significant for colon cancer in the patient's brother  at age 43.  Her father died due to trauma.  She has two healthy sisters.  Her mother does have a history of cardiovascular disease.   REVIEW OF SYSTEMS:  As outlined above.  She states that prior to  Saturday, she was at baseline without any exertional chest pain or  limiting breathlessness.  She does have episodes of nausea and emesis,  but these have been long-term and evaluated by Dr. Jena Gauss in the past.  She is not bothered by reflux on any regular basis.  She has no  palpitations.  She does have occasional leg pain and cramping.  No  dizziness or syncope.  No melena or hematochezia.  Otherwise negative.   PHYSICAL EXAMINATION:  VITAL SIGNS:  The patient is afebrile, heart rate  is 66 in sinus rhythm.  Oxygen saturation is 100% on 2 liters nasal  cannula.  Respirations 15 and nonlabored.  Blood pressure 106/60.  GENERAL:  This is a thin chronically ill appearing woman, looking older  than stated age in no acute distress.  HEENT:  Conjunctivae normal.  Oropharynx clear with poor dentition.  NECK:  Supple.  No loud carotid bruits, no thyromegaly.  LUNGS:  Clear with diminished breath sounds.  No wheezing noted.  Respiratory effort is nonlabored at rest.  CARDIAC:  Reveals a regular rate and rhythm.  No pericardial rub or S3  gallop.  Soft apical systolic murmur.  No diastolic murmur.  ABDOMEN:  Soft, nontender.  No obvious bruit.  No hepatomegaly.  Bowel  sounds are present.  EXTREMITIES:  Exhibit no significant pitting edema.  Distal  pulses are diminished at 1+.  SKIN:  Warm and dry.  MUSCULOSKELETAL:  No kyphosis noted.  NEUROPSYCHIATRIC;  the patient is alert and oriented x3.  Affect is  somewhat flat.   LABORATORY DATA:  From [facility]:  BUN 11, creatinine  0.7.  Sodium 139, potassium 4.4, hemoglobin 16.1, hematocrit 46.5,  platelets 223.  Total CK 29, troponin-I less than 0.01.  WBCs 8.8.   IMPRESSION:  1. Recent onset fatigue and chest pain syndrome concerning for      unstable angina.  Initial cardiac markers at [facility] were normal and electrocardiogram is nonspecific.  Ms.      [name] has a history of coronary artery disease, status post      myocardial infarction back in 2001, managed with a bare metal stent      to the circumflex at [facility] in [place].  This stent      was noted to be patent at subsequent angiography at [facility] in December 2001, with otherwise mild coronary      atherosclerosis and normal left ventricular systolic function.  She      has had no follow-up ischemic testing and no regular cardiology      follow-up.  2. History of hyperlipidemia, not on statin therapy.  3. Ongoing tobacco abuse.  4. Gastroesophageal reflux disease, treated with p.r.n. Pepcid.  She      does have a history of intermittent nausea and emesis and has been      followed by Dr. Jena Gauss with endoscopy and colonoscopy back in 2007,      as outlined above.  No major pathologies were uncovered at that      time.   PLAN:  [name] [name] is now admitted to the intensive care unit.  We will  plan to continue aspirin, load with Plavix, initiate intravenous  heparin, begin Zocor 80 mg p.o. nightly, continue nitroglycerin  infusion, initiate nicotine replacement patch, and hold off beta-blocker  for the time being given her resting heart rate in the 60s.  Follow-up  full set of labs, including cardiac markers will be obtained, as well as  a fasting lipid profile  in the morning.  Chest x-ray will be obtained.  She will be scheduled for a cardiac catheterization tomorrow assuming  she remains stable without progressive symptoms in the meanwhile.      Jonelle Sidle, MD  Electronically Signed     SGM/MEDQ  D:  08/30/2008  T:  08/30/2008  Job:  [id number]   cc:   Selinda Flavin

## 2010-11-13 NOTE — Cardiovascular Report (Signed)
NAMELORYN, HAACKE NO.:  [id number]   MEDICAL RECORD NO.:  [id number]          PATIENT TYPE:  INP   LOCATION:  2012                         FACILITY:  [facility]   PHYSICIAN:  Verne Carrow, MDDATE OF BIRTH:  07-06-1960   DATE OF PROCEDURE:  08/31/2008  DATE OF DISCHARGE:                            CARDIAC CATHETERIZATION   PROCEDURE PERFORMED:  1. Left heart catheterization.  2. Selective coronary angiography.  3. Left ventricular angiogram.   OPERATOR:  Verne Carrow, MD   INDICATIONS:  Chest pain in a patient with known coronary artery disease  with placement of a bare-metal stent in the mid circumflex coronary  artery in 2001.   PROCEDURE IN DETAIL:  The patient was brought to the inpatient cardiac  catheterization laboratory after signing informed consent for the  procedure.  The right groin was prepped and draped in a sterile fashion.  Lidocaine 1% was used for local anesthesia.  A 6-French sheath was  inserted into the right femoral artery without difficulty.  Standard  diagnostic catheters were used to perform selective coronary  angiography.  A 6-French pigtail catheter was used to cross the aortic  valve into the left ventricle.  Following performance of the left  ventricular angiogram, the catheter was pulled back across the aortic  valve with no significant pressure gradient measured.  The patient  tolerated the procedure well.  An Angio-Seal femoral artery closure  device was placed in the right femoral artery.   ANGIOGRAPHIC FINDINGS:  1. Left main coronary artery bifurcated into the circumflex and LAD      and had no significant disease noted.  2. The left anterior descending is a large vessel that courses to the      apex and gives rise to a first small diagonal branch, a normal      second diagonal branch, and a small third diagonal branch.  There      was a 20% stenosis noted in the midportion of the LAD.  3. The left  circumflex is a relatively large codominant vessel that      gives rise to a large first marginal branch, a large second      marginal branch, and a small posterolateral branch.  There is a      stent in the mid to distal circumflex which extends into the second      marginal branch.  There is no significant in-stent stenosis noted.      There is a 30% stenosis noted prior to the stented segment in the      midportion of the circumflex.  4. The right coronary artery is a small to moderate size codominant      vessel that has serial 30% lesions in the midportion and gives rise      to a small posterior descending artery.  5. Left ventricular angiogram was performed in the RAO projection and      showed normal left ventricular systolic function with no wall      motion abnormalities.  Ejection fraction was estimated at 55%.   HEMODYNAMIC DATA:  Central aortic  pressure 84/50.  Left ventricular  pressure 87/5.  End-diastolic pressure 11.   IMPRESSION:  1. Noncardiac etiology of chest pain.  2. Stable single-vessel coronary artery disease with patent stented      segment in the mid to distal circumflex coronary artery.  3. Mild nonobstructive disease in the right coronary artery and the      left anterior descending coronary artery.  4. Normal left ventricular systolic function.   RECOMMENDATIONS:  I do not think that this patient's chest pain could be  explained by a cardiac etiology.  I would like to check an  echocardiogram to rule out any significant pericardial effusion.  The  patient has been hypotensive during the hospitalization, but this was  while on a nitroglycerin drip.  We should also consider a GI source of  her constant chest pain.  I will discontinue her Pepcid and start her on  a proton pump inhibitor now.  Her Plavix will also be stopped.  We will  give a GI cocktail after she leaves the cath lab.  I will also check a D-  dimer level to rule out the possibility of  pulmonary embolism.  Further  plans will be made by the Cardiology team who is following this patient.      Verne Carrow, MD  Electronically Signed     CM/MEDQ  D:  08/31/2008  T:  08/31/2008  Job:  [id number]

## 2010-11-13 NOTE — Assessment & Plan Note (Signed)
[name], [name]                CHART#:  [id number]   DATE:  01/28/2008                       DOB:  1960/07/08   CHIEF COMPLAINT:  Follow up of constipation.   SUBJECTIVE:  The patient is here for followup.  She was last seen on  01/14/2008.  She had acute on chronic constipation and left-sided  abdominal pain.  It was felt to be due to medication effect.  She was  asked to stop HyoMax, dicyclomine, and avoid hydrocodone and Darvocet.  She was given HalfLytely colonic purge.  She actually required 2 doses  of HalfLytely over a 2-day period of time to have good results.  She  states she feels much better now.  Her left lower quadrant abdominal  pain is much improved.  She has had bowel movement every couple of days  since then.  She is taking MiraLax 17 g daily now.  She is also taking  now stool softeners 2 daily.  She has had to use stimulant laxatives  about 2 times since her last office visit.  She denies any nausea or  vomiting.  She feels well.   CURRENT MEDICATIONS:  See updated list.   ALLERGIES:  Vicodin.   PHYSICAL EXAMINATION:  VITAL SIGNS:  Weight 122 up 5 pounds, temperature  97.8, blood pressure 100/80, and pulse 80.  GENERAL:  A pleasant, well-nourished, well-developed Caucasian female in  no acute distress.  SKIN:  Warm and dry.  No jaundice.  HEENT:  Sclerae nonicteric.  Oropharyngeal mucosa is moist and pink.  ABDOMEN:  Positive bowel sounds.  Abdomen is soft, nontender, and  nondistended.  No organomegaly or masses.  No rebound or guarding.  No  abdominal bruits or hernias.   IMPRESSION:  Acute on chronic constipation with left-sided abdominal  pain, all improved.   PLAN:  1. Continue MiraLax 17 g daily.  2. Continue Colace stool softeners.  3. If she does not have a bowel movement every 2-3 days, she may take      an additional dose of MiraLax.  4. She was advised to avoid narcotics as much as possible as this will      exacerbate her      abdominal  pain and constipation.  5. Office visit on a p.r.n. basis.       Tana Coast, P.A.  Electronically Signed     R. Roetta Sessions, M.D.  Electronically Signed    LL/MEDQ  D:  01/28/2008  T:  01/29/2008  Job:  [id number]   cc:   Dala Dock, M.D.

## 2010-11-13 NOTE — Assessment & Plan Note (Signed)
[facility]                          [facility] OFFICE NOTE   [name], [name]                      MRN:          [id number]  DATE:09/09/2008                            DOB:          Sep 12, 1960    [name] [name] is a pleasant 50 year old female who has a history of coronary  artery disease with a bare-metal stent to the mid circumflex in 2001.  She was recently admitted to [facility] with complaints of  chest pain.  She underwent cardiac catheterization.  She was found to  have nonobstructive coronary artery disease and normal LV function.  She  also had an echocardiogram that showed normal LV function with no  pericardial effusion.  She was placed on Protonix and also had a D-dimer  that was normal at 0.33.  Also of note, she had some fatigue and her TSH  was normal at 0.927.  She was discharged.  Since then, she has done  reasonably well from a cardiac standpoint.  There is no dyspnea, chest  pain, or syncope.  However, she does complain of fatigue and depression.  She is continuing to smoke, but she is working on quitting.   MEDICATIONS:  1. Aspirin 81 mg p.o. daily.  2. Protonix 40 mg p.o. daily.  3. Folate 1 mg p.o. daily.  4. Advair.  5. Zocor 40 mg p.o. nightly.  6. She also takes albuterol and nitroglycerin as needed.   PHYSICAL EXAMINATION:  Today shows a blood pressure of 121/81 and a  pulse of 69.  She weighs 123 pounds.  Her HEENT is normal.  Her neck is  supple.  Her chest is clear.  Cardiovascular reveals regular rate and  rhythm.  Abdominal exam shows no tenderness.  The right groin shows no  hematoma, but there is a bruit.  Extremities showed no edema.   DIAGNOSES:  1. Coronary artery disease - The patient did undergo cardiac      catheterization on August 31, 2008, and was found to have normal left      ventricular function and no obstructive coronary artery disease.      She will continue with medical therapy  including her aspirin and      statin.  2. Hyperlipidemia - She will continue on her Zocor, which was      initiated at the time of hospitalization.  She will need followup      lipids and liver in 6 weeks with adjustment and a goal LDL of less      than 70.  3. Right groin bruit - We will check an ultrasound today to exclude      pseudoaneurysm.  4. Tobacco abuse - We discussed the importance of discontinuing this.  5. Fatigue/depression - I have asked her to follow up with Dr. Dimas [name]      concerning this issue.  6. History of migraine headaches.   I have asked her to follow up with either Dr. Diona [name] or [name] [name]  here in [place] in approximately 3 months.     [name] Frieze Jens Som, MD, [facility]  Electronically  Signed    BSC/MedQ  DD: 09/09/2008  DT: 09/10/2008  Job #: [id number]

## 2010-11-16 NOTE — Op Note (Signed)
NAME:  [name] [name], [name] [name]              ACCOUNT NO.:  [id number]   MEDICAL RECORD NO.:  [id number]          PATIENT TYPE:  AMB   LOCATION:  DAY                           FACILITY:  [facility]   PHYSICIAN:  R. Roetta Sessions, M.D. DATE OF BIRTH:  1961-03-30   DATE OF PROCEDURE:  05/16/2004  DATE OF DISCHARGE:                                 OPERATIVE REPORT   PROCEDURES:  Diagnostic esophagogastroduodenoscopy, followed by colonoscopy  with biopsy.   INDICATION FOR PROCEDURE:  The patient is a 50 year old with recent nausea  and vomiting, nonbloody diarrhea, generalized abdominal pain.  She has a  positive family history in that her brother at age 6 succumbed to  colorectal cancer.  EGD and colonoscopy are now being done to further  evaluate her symptoms.  It is notable that her nausea and diarrhea have  improved over the last one week.  Please see the documentation in the  medical record for more information.   PROCEDURE NOTE:  O2 saturation, blood pressure, pulse, and respirations were  monitored throughout the entirety of both procedures.   CONSCIOUS SEDATION:  Versed 13 mg IV, Demerol 225 mg IV in divided doses,  Cetacaine spray for topical oropharyngeal anesthesia.   INSTRUMENT USED:  Olympus video chip system.   EGD findings:  Examination of the tubular esophagus revealed only a small  hiatal hernia.  The esophageal mucosa otherwise appeared normal.  EG  junction easily traversed.   Stomach:  The gastric cavity was empty and insufflated well with air.  A  thorough examination of the gastric mucosa including a retroflexed view of  the proximal stomach and esophagogastric junction demonstrated only a small  hiatal hernia.  Pylorus patent and easily traversed.  Examination of the  bulb and second portion revealed no abnormalities.   Therapeutic/diagnostic maneuvers performed:  None.   The patient tolerated the procedure well and was prepared for colonoscopy.   Digital rectal exam  revealed no abnormalities.   Endoscopic findings:  Prep was good.   Rectum:  Examination of the rectal mucosa including a retroflexed view of  the anal verge revealed only minimal internal hemorrhoids.   Colon:  The colonic mucosa was surveyed from the rectosigmoid junction  through the left, transverse, and right colon to the area of the appendiceal  orifice, ileocecal valve, and cecum.  These structures were well-seen and  photographed for the record.  From this level the scope was slowly withdrawn  and all previously-mentioned mucosal surfaces were again seen.  The terminal  ileum was intubated to 10 cm.  The patient was noted to have a frank 5 mm  ulcer in the midright colon overlying a long linear scar (versus  inflammatory process and evolution).  Please see photos.  It was biopsied.  The remainder of the colonic mucosa appeared normal.  The terminal ileum  (distal 10 cm) appeared normal.  The patient tolerated both procedures well,  was reacted in endoscopy.  It is notable, a stool sample was obtained during  the colonoscopy.   IMPRESSION:  Esophagogastroduodenoscopy:  Small hiatal hernia, otherwise  normal  stomach, esophagus, D1, D2.   Colonoscopy findings:  1.  Internal hemorrhoids, otherwise normal rectum.  2.  Linear ulcer with scar versus inflammatory process, midright colon,      biopsied, otherwise normal colon, normal terminal ileum.   I suspect the inflammatory process seen in the right colon has as much to do  with this lady's recent symptoms.  Would favor more an infectious process  (which clinically is improving) versus new-onset inflammatory bowel disease.   RECOMMENDATIONS:  1.  No change in her current medical regimen.  2.  Will follow up on biopsies and stool studies and make further      recommendations in the very near future.     Otelia Sergeant   RMR/MEDQ  D:  05/16/2004  T:  05/17/2004  Job:  [id number]   cc:   Selinda Flavin  [address] [place], [address]  [place]   [place] [address]  Fax: [phone]

## 2010-11-16 NOTE — Discharge Summary (Signed)
[place]  Patient:    [name] [name], [name] [name]                     MRN: [id number] Adm. Date:  [id number] Attending:  Mirian Mo Dictator:   Rozell Searing, P.A.                           Discharge Summary  DATE OF BIRTH: 1961/04/03  Add at discharge diagnosis under 1a: Left against medical advice.  Then proceed with 1b - status post acute MI/PTCA of circumflex as noted before, and 1c would then be preserved left ventricular function.  Keep the rest as outlined. DD:  06/20/00 TD:  06/21/00 Job: [id number]

## 2010-11-16 NOTE — Op Note (Signed)
NAME:  [name] [name], [name] [name]              ACCOUNT NO.:  [id number]   MEDICAL RECORD NO.:  [id number]          PATIENT TYPE:  AMB   LOCATION:  DAY                           FACILITY:  [facility]   PHYSICIAN:  R. Roetta Sessions, M.D. DATE OF BIRTH:  10-May-1961   DATE OF PROCEDURE:  02/10/2006  DATE OF DISCHARGE:                                 OPERATIVE REPORT   PROCEDURE:  Colonoscopy with snare polypectomy.   INDICATIONS FOR PROCEDURE:  A 50 year old lady with a history of  intermittent lower abdominal cramps and intermittent blood per rectum.  Colonoscopy is now being done. This approach has been discussed with the  patient at length. The potential risks, benefits, and alternatives have been  reviewed, questions answered. She is agreeable. Please see the documentation  in the medical record.   MONITORING:  O2 saturation, blood pressure, pulse and respirations were  monitored throughout the entire procedure.   CONSCIOUS SEDATION:  IV Versed and Demerol  in incremental doses.   INSTRUMENT:  Olympus videochip system.   FINDINGS:  Digital rectal exam revealed no abnormalities.   ENDOSCOPIC FINDINGS:  The prep was good.   RECTUM:  Examination of the rectal mucosa including retroflexed view of the  anal verge revealed no abnormalities.   COLON:  The colonic mucosa was surveyed from the rectosigmoid junction  through the left transverse and right colon to the area of the appendiceal  orifice, ileocecal valve and cecum. These structures were well seen and  photographed for the record. From this level, the scope was slowly  withdrawn. All previously mentioned mucosal surfaces were again seen. The  patient had an angry 8 mm pedunculated polyp at 30 cm. It was friable and  was oozing blood. The remainder of the colonic mucosa appeared normal. This  polyp was resected with snare cautery and recovered through the scope. The  patient tolerated the procedure well and was reacted in endoscopy.   IMPRESSION:  Normal rectum, pedunculated polyp at 30 cm resected with the  snare cautery. The remainder of the colonic mucosa appeared normal.   The polyp resected today could have conceivably produced some blood per  rectum even though it was small, it was friable.   RECOMMENDATIONS:  1. No aspirin or arthritis medications for the next 10 days.  2. Followup on path.  3. Further recommendations to follow.   ADDENDUM:  From February 05, 2006 through my office, CBC was entirely normal.  H&H 13.4 and 38.5, sed rate was 6.      R. Roetta Sessions, M.D.  Electronically Signed     RMR/MEDQ  D:  02/10/2006  T:  02/11/2006  Job:  [id number]   cc:   [name]  Fax: [phone]

## 2010-11-16 NOTE — Cardiovascular Report (Signed)
[place]  Patient:    [name] [name], [name] [name]                     MRN: [id number] Proc. Date: 06/26/00 Adm. Date:  [id number] Attending:  Alric Quan CC:         Jesse Sans. Daleen Squibb, M.D. [facility]  Arlys John S. Jens Som, M.D. [facility], [place], [place]  Cardiac Catheterization Lab   Cardiac Catheterization  PROCEDURE PERFORMED:  Left heart catheterization with coronary angiography and left ventriculography.  INDICATIONS:  [name] is a 50 year old woman with a history of previous myocardial infarction treated with stent placement in the left circumflex in August of this year.  She has been experiencing recurrent symptoms of chest pain and was referred for cardiac catheterization.  PROCEDURAL NOTE:  A 6-French sheath was placed in the right femoral artery. Standard Judkins 6-French catheters were utilized.  Left ventriculography was performed in multiple RAO and LAO projections.  Contrast was Omnipaque.  There were no complications.  RESULTS:  Hemodynamics:  Left ventricular pressure 126/20, aortic pressure 118/74. There is no aortic valve gradient.  Left ventriculogram:  There is moderate hypokinesis of the inferior wall and moderate hypokinesis of the posterolateral wall.  Ejection fraction calculated at 59%.  There is 2+ mild mitral regurgitation.  Coronary arteriography codominant): 1. The left main is normal. 2. The left anterior descending artery has a 30% stenosis in the mid vessel.    The LAD gives rise to a small first diagonal, normal second diagonal, and a    small third diagonal. 3. The left circumflex is a relatively large codominant vessel.  It gives    rise to a large first marginal, a large second marginal, and a small    posterolateral branch.  There is a stent in the mid to distal circumflex    which extends into the second marginal branch.  There is a diffuse 20%    stenosis within the stent. 4. The right coronary  artery is a relatively small codominant vessel.  There    is a 20% stenosis in the mid vessel.  The distal right coronary artery    gives rise to a normal-sized acute marginal which supplies a portion of    the inferior septum and a small posterior descending artery.  IMPRESSIONS: 1. Left ventricular systolic function in lower range of normal with wall    motion abnormalities as described and mild mitral regurgitation. 2. Mild nonobstructive coronary artery disease with a patent stent in the    left circumflex.  PLAN:  Medical therapy. DD:  06/26/00 TD:  06/26/00 Job: [id number]

## 2010-11-16 NOTE — H&P (Signed)
[name] [name], BODI NO.:  [id number]   MEDICAL RECORD NO.:  [id number]          PATIENT TYPE:  INP   LOCATION:  3310                         FACILITY:  [facility]   PHYSICIAN:  Weeki [place] Bing, M.D. [facility] OF BIRTH:  07-07-1960   DATE OF ADMISSION:  05/05/2005  DATE OF DISCHARGE:                                HISTORY & PHYSICAL   REFERRING:  [facility] emergency department.   PRIMARY CARE PHYSICIAN:  Selinda Flavin, M.D.   PRIMARY CARDIOLOGIST:  Formerly Olga Millers, M.D.   HISTORY OF PRESENT ILLNESS:  A 50 year old woman with known coronary disease  transferred from [facility] after presenting with chest discomfort.  [name]'s history of coronary disease dates to 2001 when she presented  with an acute myocardial infarction. Intervention was undertaken with  stenting of the circumflex artery. She returned in December of that year  with recurrent chest discomfort. Repeat coronary angiography revealed  patency at the stent site and no significant focal disease elsewhere. LV  systolic function was only minimally impaired. She has done well since that  time with little in the way of follow-up care. She has had a supply of  nitroglycerin from her primary care physician, but has not utilized it until  today. She takes aspirin on a daily basis. She has not been treated with  lipid-lowering medications. She continues to smoke cigarettes at the rate of  one half pack per day.   She has had chronic airway congestion in recent weeks and has been treated  with recurrent courses of antibiotics for chronic sinusitis. She really not  has not had recent nasal discharge, but developed a cough within the past  day or two. She had a paroxysm of coughing this morning and subsequently  noted sharp moderately severe localized left lower chest discomfort  associated with mild dyspnea. She has had some diaphoresis, but this has  been common of late and is thought to  represent menopausal symptoms. She did  have some nausea and emesis when she was coughing.   PAST MEDICAL HISTORY:  Otherwise, notable for -  1.  Hysterectomy approximately 20 years ago apparently with unilateral      oophorectomy.  2.  She has a history of CVA in 1992 for which we have no documentation.  3.  She was said to have a pulmonary embolism in the late 1990s.  4.  She also has a history of depression.  5.  Migraines.  6.  She underwent cesarean section in 1979.  7.  Tonsillectomy at age 19.  54.  She was hospitalized approximately a year ago with GI symptoms and found      to have colitis.   MEDICATIONS:  She has not been taking any pharmaceuticals of late.   ACTIVITY:  She has no known drug allergies.   FAMILY HISTORY:  Brother developed colon cancer at age 72.  Mother has  coronary disease.  Father died due to trauma.  Two sisters are healthy.   SOCIAL HISTORY:  The patient is employed part-time, but does not have [employer]. She denies excessive  use of alcohol. She has two adult children.   REVIEW OF SYSTEMS:  No headaches; some chronic weakness and fatigue; class  II dyspnea on exertion. Intermittent depression and anxiety. Other systems  reviewed and are negative.   PHYSICAL EXAMINATION:  GENERAL:  A pleasant woman describing chest  discomfort but not appearing to be in any distress.  VITAL SIGNS:  Her heart rate is 60 and regular, blood pressure 80/60,  respirations 14, afebrile.  HEENT: Anicteric sclerae; normal lids and conjunctivae.  NECK:  Minimal jugular venous distension; normal carotid upstrokes without  bruits.  ENDOCRINE:  No thyromegaly.  HEMATOPOIETIC:  No adenopathy.  SKIN:  No significant lesions.  LUNGS:  Clear; normal AP diameter.  CARDIAC:  Normal first and second heart sounds; normal PMI; grade 2/6  basilar systolic ejection murmur.  ABDOMEN:  Soft and nontender; no bruits; no masses; no organomegaly.  EXTREMITIES:  Distal pulses  intact; no edema.  NEUROMUSCULAR:  Symmetric strength and tone; normal cranial nerves.  MUSCULOSKELETAL:  No joint deformities.  PSYCHIATRIC:  Normal orientation; normal affect.   EKG:  Normal sinus rhythm; within normal limits.   LABORATORY DATA:  The initial laboratory was notable for normal renal  function, normal CBC and normal cardiac markers.   IMPRESSION:  Ms. Clementeen Graham presents with atypical chest discomfort 5 years  following an acute myocardial infarction. The importance of appropriate  medical therapy and discontinuation of tobacco abuse in the long run was  stressed to her. We will rule out myocardial infarction and plan an  outpatient functional study. She is relatively hypotensive at the present  time. This followed initial treatment with intravenous nitroglycerin. Fluids  will be administered.   Her history of sinusitis is unclear. We will continue doxycycline, which she  has been taking. Treatment for coronary artery disease will be optimized  with a smoking cessation program and likely chronic treatment with a statin  drug. We will provide continuing care in our [place] office.      Rockham Bing, M.D. [facility]  Electronically Signed     RR/MEDQ  D:  05/05/2005  T:  05/06/2005  Job:  [id number]

## 2010-11-16 NOTE — Discharge Summary (Signed)
[name] [name], WILL NO.:  [id number]   MEDICAL RECORD NO.:  [id number]          PATIENT TYPE:  INP   LOCATION:  3310                         FACILITY:  [facility]   PHYSICIAN:  Pricilla Riffle, M.D.    DATE OF BIRTH:  Mar 17, 1961   DATE OF ADMISSION:  05/05/2005  DATE OF DISCHARGE:  05/06/2005                                 DISCHARGE SUMMARY   PROCEDURES:  None.   PRIMARY CARDIOLOGIST:  Jonelle Sidle, M.D., [facility] office.   PRIMARY CARE PHYSICIAN:  Dr. Selinda Flavin.   CHIEF COMPLAINT:  Chest pain.   SECONDARY DIAGNOSES:  1.  Acute myocardial infarction in 2001 with a stent to the circumflex, left      against medical advice that admission.  2.  Reportedly recatheterized at [facility] in [place] with no      other focal disease and minimal left ventricular dysfunction.  3.  Ongoing tobacco use.  4.  Hyperlipidemia.  5.  Recent upper respiratory infection on antibiotics and cough medicine.  6.  Reported history of cerebrovascular accident in 71.  7.  Reported history of pulmonary embolism in the 1990s.  8.  History of depression.  9.  History of migraines.  10. History of colitis diagnosed in November 2005 as well as hiatal hernia.  11. Status post cesarean section and tonsillectomy.  12. Noncompliance with medication.  13. Family history of coronary artery disease in her mother.   ALLERGIES:  NO KNOWN DRUG ALLERGIES.   HOSPITAL COURSE:  [name] [name] is a 50 year old female with known coronary  artery disease.  She went to [facility] because she had been having  chest pain for several days.  She had a recent upper respiratory infection  for which she had received cough syrup and antibiotics.  Some of the  coughing spells were severe enough to cause nausea and vomiting.  She  developed chest pain.  She went to [facility] and was transferred to  [facility] for further evaluation and treatment.   Her cardiac enzymes were negative for  MI, and her symptoms improved with  Naprosyn and Tylenol No. 3.  A lipid profile was performed which showed  total cholesterol 174, triglycerides 112, HDL 43, LDL 109.  Because of this,  Dr. Tenny Craw wished to start her on Vytorin 10/20.   On May 06, 2005, [name] [name]'s symptoms were well controlled with pain  medication and nonsteroidal, anti-inflammatory medication.  She was  evaluated by Dr. Tenny Craw and considered stable for discharge with outpatient  followup arranged.   TIME SPENT AT DISCHARGE:  Twenty-four minutes.   DISCHARGE INSTRUCTIONS:  Her activity level is to be increased gradually.  She is to stick to a low fat diet.  She is to follow up with Dr. Diona Browner on  May 20, 2005.  She is to follow up with Dr. Dimas Aguas as needed.   DISCHARGE MEDICATIONS:  1.  Coated aspirin 81 mg every day.  2.  Nitroglycerin sublingual p.r.n.  3.  Tylenol No. 3 p.r.n., ten pills given, no refills.  4.  Naprosyn b.i.d. p.r.n.  5.  Mytussin and albuterol as prior to admission.  6.  Doxycycline 100 mg every day.  7.  Afrin spray is to be avoided.  8.  Vytorin 10/20 every day, samples and prescription given.   She is not to smoke.      Theodore Demark, P.A. [facility]    ______________________________  Pricilla Riffle, M.D.    RB/MEDQ  D:  05/06/2005  T:  05/06/2005  Job:  [id number]   cc:   [facility]  [facility]   Selinda Flavin, Dr.

## 2010-11-16 NOTE — Op Note (Signed)
NAME:  [name] [name], [name] [name]              ACCOUNT NO.:  [id number]   MEDICAL RECORD NO.:  [id number]          PATIENT TYPE:  AMB   LOCATION:  DAY                           FACILITY:  [facility]   PHYSICIAN:  R. Roetta Sessions, M.D. DATE OF BIRTH:  [birth year]   DATE OF PROCEDURE:  03/24/2006  DATE OF DISCHARGE:  03/24/2006                                 OPERATIVE REPORT   PROCEDURE:  Esophagogastroduodenoscopy and small bowel biopsy.   INDICATIONS FOR PROCEDURE:  The patient is a 50 year old lady with a two  month history of nausea, bloating, vomiting, non-bloody diarrhea.  She is  not on acid suppression therapy.  EGD is now being done.  This procedure has  been discussed with the patient at length, potential risks, benefits,  alternatives have been reviewed, questions answered.  She is agreeable.  Please see documentation in medical record.   PROCEDURE NOTE:  O2 saturation, blood pressure, pulse and respirations  monitored throughout the entire procedure.  Conscious sedation with Versed  and Demerol in incremental doses.   FINDINGS:  Examination of the tubular esophagus revealed patulous EG  junction, free reflux.  EG junction easily traversed.  Esophageal mucosa  appeared normal.  Next the stomach and gastric cavity was emptied,  insufflated with air.  A thorough examination of the gastric mucosa in  retroflexion of the proximal stomach, esophagus, gastric junction  demonstrated a small hiatal hernia.  The patient heaved repeatedly during  the procedure.  She had some excoriations of the fundal mucosa consistent  with trauma.  Otherwise gastric mucosa appeared normal.  Pylorus was patent,  easily traversed. Exam of the bulb, second and third portion revealed no  abnormalities.   Therapy/diagnostic maneuvers performed. Biopsies of D2 and D3  taken for  histologic stain. The patient tolerated the procedure well and was reactive  after endoscopy.   IMPRESSION:  Normal esophagus, patulous EG  junction, small hiatal hernia,  otherwise normal stomach, D1, D2, D3.  Status post biopsies of D2 and D3.   RECOMMENDATIONS:  1. The patient may have an element of reflux to account for her symptoms.      A trial of Aciphex 20 mg orally daily.  Prescription given.  Follow-up      on path.  2. Further recommendations to follow.      Jonathon Bellows, M.D.  Electronically Signed     RMR/MEDQ  D:  03/24/2006  T:  03/26/2006  Job:  [id number]   cc:   Selinda Flavin  Fax: [phone]

## 2010-11-16 NOTE — Consult Note (Signed)
NAME:  [name] [name]              ACCOUNT NO.:  [id number]   MEDICAL RECORD NO.:  [id number]           PATIENT TYPE:   LOCATION:                                 FACILITY:   PHYSICIAN:  R. Roetta Sessions, M.D. DATE OF BIRTH:  March 06, 1961   DATE OF CONSULTATION:  DATE OF DISCHARGE:                                   CONSULTATION   REFERRING PHYSICIAN:  Dr. Dimas Aguas.   REASON FOR CONSULTATION:  Significant abdominal pain, nausea, vomiting, and  high output diarrhea.   HISTORY OF PRESENT ILLNESS:  [name] [name] is a 50 year old Caucasian female who  reportedly notes October 18 she developed significant left upper quadrant  abdominal pain that later migrated to her entire abdomen. She was also  having nausea, vomiting, and diarrhea up to 15 stools per day. Two days  later, she was admitted to [facility] and treated for  presumed clostridium difficile colitis. She had been given Cipro as well as  Flagyl. CT scan of the pelvis without contrast medium did not show evidence  of colitis or diverticulitis. The patient is unsure of stool for clostridium  difficile results, and we have requested these from [facility]. She notes that while hospitalized, her pain subsided with the IV  pain medications as well as her nausea, vomiting, and diarrhea. She was  later sent home where diarrhea recurred. She also notes small amounts of  rectal bleeding and one episode of melena as well. She is complaining of  significant weakness, heartburn, indigestion, and continued nausea. She is  taking Levsin 4 times a day and Percocet as needed for pain. She just  completed a course of 500 mg of Cipro and Flagyl.   There is no known family history of inflammatory bowel disease, although she  does have one brother succumb to colon carcinoma diagnosed at age 46 and  succumbed a year later.   PAST MEDICAL HISTORY:  1.  Coronary artery disease status post cardiac catheterization with  stent      placement in 2001.  2.  CVA in 1992.  3.  Depression.  4.  Pulmonary embolus around 1999.  5.  Migraine headaches.  6.  Cesarean section in 1979.  7.  Tonsillectomy at age 21.  8.  History with unilateral oophorectomy although unsure which ovary was      removed in 1985.   CURRENT MEDICATIONS:  1.  Percocet p.r.n.  2.  Hyoscyamine 0.125 mg sublingual q.i.d.  3.  Tylenol p.r.n.  4.  Mylanta p.r.n.   ALLERGIES:  No known drug allergies.   FAMILY HISTORY:  Positive for brother diagnosed with colon cancer at age 73,  succumbed 1 year later. Mother at age 98 with coronary artery disease.  Father deceased at age 69 secondary to accident. She has 2 healthy sisters.   SOCIAL HISTORY:  [name] [name] currently lives with a significant other. She has  2 grown healthy children. She is employed part time with [employer]. She  reports a 30-pack-year tobacco use history. Denies any alcohol or drug use.   REVIEW OF SYSTEMS:  CONSTITUTIONAL:  Weight is down 5 pounds since the onset  of symptoms 3 weeks ago. She is complaining of weakness and fatigue. She did  have low grade fever while hospitalized although currently afebrile. She is  complaining of chills as well. CARDIOVASCULAR:  She is complaining of  palpitations. She did have some atypical chest pain which she was seen in  the emergency room and felt to be due to significant retching with emesis.  PULMONARY:  She complains of occasional shortness of breath on exertion.  Denies any cough or hemoptysis. PSYCHOSOCIAL:  She does feel somewhat  depressed today. She is having some anxiety as well. Denies any suicidal or  homicidal ideation. GASTROINTESTINAL:  See HPI. She denies any dysphagia or  odynophagia.   PHYSICAL EXAMINATION:  VITAL SIGNS:  Weight 131 pounds, height 64 inches,  temperature 98.4, blood pressure 110/68, pulse 84.  GENERAL:  [name] [name] is a 50 year old Caucasian female who is pale appearing,  nontoxic. She is  alert, oriented, pleasant, and cooperative in no acute  distress.  HEENT:  Sclerae are clear and nonicteric. Conjunctivae pink. Oropharynx pink  and moist without any lesions.  NECK:  Supple without any mass or thyromegaly.  CHEST:  Heart regular rate and rhythm with normal S1 and S2 without any  murmurs, clicks, rubs, or gallops.  LUNGS:  Clear to auscultation bilaterally.  ABDOMEN:  Positive bowel sounds x4. No bruits auscultated. Soft. She does  have mild tenderness to entire abdomen on deep palpation. No significant  rebound tenderness or guarding.  RECTAL:  No external lesions visualized. Good sphincter tone. Small amount  of light brown stools obtained from vault which is Hemoccult negative.  EXTREMITIES:  2+ pedal pulses bilaterally.  SKIN:  Pale, warm, and dry without any rash or jaundice.   LABORATORY DATA:  From March 16, 2004 with WBCs of 9.7, hemoglobin 13.1,  hematocrit 37.9, platelets 334. Metabolic panel normal except a high CO2 at  31. Stool culture negative from May 02, 2004.   We have requested but not yet received LFTs, recent blood work, and  clostridium difficile stool.   ASSESSMENT:  [name] [name] is a 50 year old Caucasian female with acute onset of  nausea, vomiting, diarrhea, generalized abdominal pain. She was recently  hospitalized and treated for presumed antibiotic-induced or clostridium  difficile colitis. Diarrhea persists despite antibiotic therapy. She  continues to have intermittent nausea and vomiting as well. Her symptoms are  definitely suspicious for inflammatory bowel disease, and I feel further  evaluation is necessary. We also need to obtain clostridium difficile  studies to determine whether it was confirmed that she did have clostridium  difficile which is another differential as well. Ultimately, given her  family history of colon carcinoma, she will need evaluation with  colonoscopy.   RECOMMENDATIONS: 1.  Labs today to include CBC,  LFTs, MET7, sed rate, and TSH.  2.  Recommended Flora-Q once daily. Prescription was given for #30 with 1      refill.  3.  She can use Imodium AD 2 tablets at the onset of the first stool of the      day followed by 3 additional tablets if needed.  4.  Recommend she have stool for clostridium difficile, ova and parasites,      and fecal WBCs prior to the initiation of Imodium.  5.  Will schedule CT scan with IV and oral contrast of abdomen and pelvis in      the very near future.  6.  Levsin 0.125 mg sublingual q.i.d. She has supply at home.  7.  She is requesting pain medicine. Therefore Vicodin 5/500 mg #20 to be      taken every 6 hours as needed for severe pain was given. She was warned      of sedation. No refills.  8.  If CT scan is negative, would proceed with colonoscopy, and this has      been discussed with [name] as well.  9.  She is to go to the emergency room if she develops severe pain or      uncontrollable nausea, vomiting or diarrhea, or fever. Otherwise we will      follow once we have CT scan results.   We would like to thank Dr. Dimas Aguas for allowing [place] to participate in the care  of [name] [name].     Kand   KC/MEDQ  D:  05/09/2004  T:  05/09/2004  Job:  [id number]   cc:   Selinda Flavin  [address]  [place]  [place] [address]  Fax: [phone]

## 2010-11-16 NOTE — Consult Note (Signed)
NAME:  [name] [name]              ACCOUNT NO.:  [id number]   MEDICAL RECORD NO.:  [id number]          PATIENT TYPE:  AMB   LOCATION:                                FACILITY:  [facility]   PHYSICIAN:  R. Roetta Sessions, M.D. DATE OF BIRTH:  1961-01-26   DATE OF CONSULTATION:  02/04/2006  DATE OF DISCHARGE:                                   CONSULTATION   REASON FOR CONSULTATION:  Abdominal cramps, bloody loose stool.   HISTORY OF PRESENT ILLNESS:  [name] [name] is a very pleasant 50-year-  old Caucasian female, who was scheduled to see [place] on January 24, 2006 for the  above mentioned symptoms but had to reschedule because of her work  constraints.  This very nice lady has had a 4-week history of abdominal  cramps and blood per rectum, sometimes seeing blood in the toilet, in the  stool and at other times only wiping blood.  She has not had any nausea and  vomiting or fever or chills.  She was treated with a course of Cipro and  Flagyl and hyoscyamine recently, with some improvement in her symptoms.  This lady was seen previously in our practice for similar symptoms back in  early 2006 and late 2005.   She had been having some nausea and vomiting and non-bloody diarrhea and  generalize abdominal pain at that time.  EGD and colonoscopy back on  May 15, 2004 demonstrated small hiatal hernia.  She had internal  hemorrhage on colonoscopy.  In addition she had a linear ulcer with area of  scar in the right colon.  Biopsies revealed inflamed colonic mucosa with  purulent material suspicious for ulceration.  Complete set of stool studies  came back.  CT of the abdomen and pelvis revealed no abnormalities.  Her  terminal ilium appeared normal in ileocolonoscopy.  Small bowel follow  through was subsequently obtained.  This was found to be a normal exam.  CT  angiogram also was performed which revealed a patent mesenteric vasculature  and patent renal vasculature.  Only mild atherosclerotic  calcifications at  the aorta-iliac bifurcation.  [name] has been a long term smoker.  There  is no family history of inflammatory bowel disease.   However there is a positive family history of colorectal neoplasia in a  brother who was diagnosed with colon cancer at age 3 and succumbed to the  disease 1 year later.   She denies taking non-steroidals on a regular basis, although she tells me  she might take a couple of Aleve every few weeks for various aches and  pains.  She does take an 81 mg aspirin daily.   PAST MEDICAL HISTORY:  Significant for coronary artery disease.  She is  status post coronary artery stenting previously.  Hyperlipidemia.   PAST SURGICAL HISTORY:  Appendectomy, hysterectomy, tonsillectomy.   CURRENT MEDICATIONS:  1. Vytorin once daily.  2. ASA 81 mg daily.   FAMILY HISTORY:  Mother is alive at age 70 with heart disease and pulmonary  disease.  Father died at age 31 in an accident.  SOCIAL HISTORY:  The patient is divorced, has 2 children.  She works for  [employer].  She smokes 1/2 pack of cigarettes per day.  She does not  use any alcohol.   REVIEW OF SYSTEMS:  No recent chest pain, dyspnea on exertion, no melena, no  odynophagia, dysphagia, early satiety, reflux symptoms, nausea and vomiting.  She denies any change in her weight, no fever or chills.   PHYSICAL EXAMINATION:  A pleasant 50 year old lady resting comfortably.  Weight 140-145, height 5 feet 4 inches, temperature 98.4, blood pressure  100/60, pulse 80.  SKIN:  Warm and dry.  No jaundice, no continued stigmata of chronic liver  disease.  HEENT:  No scleral icterus, conjunctiva are pink, oral cavity no lesions.  CHEST:  Lungs were clear to auscultation.  HEART:  Regular rate and rhythm without murmur, gallop or rub.  BREAST EXAM:  Deferred.  ABDOMEN:  Benign, nondistended, positive bowel sounds, soft, nontender,  without pulsatile mass or organomegaly.  EXTREMITIES:  No  edema.  RECTAL EXAM:  Deferred to time of colonoscopy.   IMPRESSION:  [name] [name] is a pleasant 50 year old lady with a 4-5  week history of lower abdominal cramps and intermittent blood per rectum.   She does have a history of nearly 2 years ago having an inflamed area of  ulceration focally in her ascending colon which was of uncertain  significance but would be suspicious of an NSAID-induced injury back at that  time.  I am impressed that she has done very well for 17 or 18 months, until  recently with her symptoms that are more or less the same.  However she  really has not had any nausea and vomiting and blood per rectum is more of a  symptoms currently.   Even though it has been a little under 2 years since she last had a  colonoscopy, given her family history, current symptoms I think we are  pretty much left with re-examining her colon at this time.   RECOMMENDATIONS:  Recommend colonoscopy for diagnostic purposes.  Potential  risks/benefits and terms have been reviewed.  Questions were answered.  We  will make further recommendations once colonoscopy has Been performed.   I would like to thank Dr. Marcelino [name] for allowing me to see this nice  lady once again.      [name] [name], M.D.  Electronically Signed     RMR/MEDQ  D:  02/04/2006  T:  02/04/2006  Job:  [id number]   cc:   [name] [name]  Fax: [phone]

## 2011-01-03 ENCOUNTER — Encounter: Payer: Self-pay | Admitting: Cardiology

## 2011-01-31 ENCOUNTER — Encounter: Payer: Self-pay | Admitting: Internal Medicine

## 2011-04-29 ENCOUNTER — Emergency Department (HOSPITAL_COMMUNITY): Payer: Self-pay

## 2011-04-29 ENCOUNTER — Encounter (HOSPITAL_COMMUNITY): Payer: Self-pay | Admitting: *Deleted

## 2011-04-29 ENCOUNTER — Emergency Department (HOSPITAL_COMMUNITY)
Admission: EM | Admit: 2011-04-29 | Discharge: 2011-04-29 | Disposition: A | Discharge status: Home / Self Care | Payer: Self-pay | Attending: Emergency Medicine | Admitting: Emergency Medicine

## 2011-04-29 DIAGNOSIS — Z87442 Personal history of urinary calculi: Secondary | ICD-10-CM | POA: Insufficient documentation

## 2011-04-29 DIAGNOSIS — R197 Diarrhea, unspecified: Secondary | ICD-10-CM

## 2011-04-29 DIAGNOSIS — I2510 Atherosclerotic heart disease of native coronary artery without angina pectoris: Secondary | ICD-10-CM | POA: Insufficient documentation

## 2011-04-29 DIAGNOSIS — R109 Unspecified abdominal pain: Secondary | ICD-10-CM | POA: Insufficient documentation

## 2011-04-29 DIAGNOSIS — F17200 Nicotine dependence, unspecified, uncomplicated: Secondary | ICD-10-CM | POA: Insufficient documentation

## 2011-04-29 DIAGNOSIS — R112 Nausea with vomiting, unspecified: Secondary | ICD-10-CM | POA: Insufficient documentation

## 2011-04-29 DIAGNOSIS — D72829 Elevated white blood cell count, unspecified: Secondary | ICD-10-CM | POA: Insufficient documentation

## 2011-04-29 DIAGNOSIS — Z7982 Long term (current) use of aspirin: Secondary | ICD-10-CM | POA: Insufficient documentation

## 2011-04-29 DIAGNOSIS — Z86718 Personal history of other venous thrombosis and embolism: Secondary | ICD-10-CM | POA: Insufficient documentation

## 2011-04-29 DIAGNOSIS — Z8673 Personal history of transient ischemic attack (TIA), and cerebral infarction without residual deficits: Secondary | ICD-10-CM | POA: Insufficient documentation

## 2011-04-29 DIAGNOSIS — K449 Diaphragmatic hernia without obstruction or gangrene: Secondary | ICD-10-CM | POA: Insufficient documentation

## 2011-04-29 DIAGNOSIS — Z9079 Acquired absence of other genital organ(s): Secondary | ICD-10-CM | POA: Insufficient documentation

## 2011-04-29 DIAGNOSIS — E860 Dehydration: Secondary | ICD-10-CM | POA: Insufficient documentation

## 2011-04-29 LAB — PREGNANCY, URINE: Preg Test, Ur: NEGATIVE

## 2011-04-29 LAB — URINE MICROSCOPIC-ADD ON

## 2011-04-29 LAB — URINALYSIS, ROUTINE W REFLEX MICROSCOPIC
Leukocytes, UA: NEGATIVE
Nitrite: NEGATIVE
Protein, ur: 100 mg/dL — AB
pH: 7 (ref 5.0–8.0)

## 2011-04-29 LAB — COMPREHENSIVE METABOLIC PANEL
ALT: 11 U/L (ref 0–35)
AST: 17 U/L (ref 0–37)
Albumin: 4.6 g/dL (ref 3.5–5.2)
Calcium: 10.5 mg/dL (ref 8.4–10.5)
Sodium: 138 mEq/L (ref 135–145)
Total Protein: 7.9 g/dL (ref 6.0–8.3)

## 2011-04-29 LAB — CBC
MCH: 30.2 pg (ref 26.0–34.0)
MCHC: 34.7 g/dL (ref 30.0–36.0)
Platelets: 269 10*3/uL (ref 150–400)
RBC: 5.43 MIL/uL — ABNORMAL HIGH (ref 3.87–5.11)

## 2011-04-29 MED ORDER — METOCLOPRAMIDE HCL 5 MG/ML IJ SOLN
10.0000 mg | Freq: Once | INTRAMUSCULAR | Status: AC
  Administered 2011-04-29: 10 mg via INTRAVENOUS
  Filled 2011-04-29: qty 2

## 2011-04-29 MED ORDER — ONDANSETRON HCL 4 MG/2ML IJ SOLN
4.0000 mg | Freq: Once | INTRAMUSCULAR | Status: AC
  Administered 2011-04-29: 4 mg via INTRAVENOUS
  Filled 2011-04-29: qty 2

## 2011-04-29 MED ORDER — SODIUM CHLORIDE 0.9 % IV BOLUS (SEPSIS)
1000.0000 mL | Freq: Once | INTRAVENOUS | Status: AC
  Administered 2011-04-29: 1000 mL via INTRAVENOUS

## 2011-04-29 MED ORDER — DIPHENHYDRAMINE HCL 50 MG/ML IJ SOLN
25.0000 mg | Freq: Once | INTRAMUSCULAR | Status: AC
  Administered 2011-04-29: 25 mg via INTRAVENOUS
  Filled 2011-04-29: qty 1

## 2011-04-29 MED ORDER — ONDANSETRON HCL 4 MG PO TABS: 4.0000 mg | ORAL_TABLET | Freq: Four times a day (QID) | ORAL | Status: AC

## 2011-04-29 MED ORDER — HYDROMORPHONE HCL 1 MG/ML IJ SOLN
1.0000 mg | Freq: Once | INTRAMUSCULAR | Status: AC
  Administered 2011-04-29: 1 mg via INTRAVENOUS
  Filled 2011-04-29: qty 1

## 2011-04-29 MED ORDER — ONDANSETRON HCL 4 MG/2ML IJ SOLN
INTRAMUSCULAR | Status: AC
  Administered 2011-04-29: 4 mg
  Filled 2011-04-29: qty 2

## 2011-04-29 MED ORDER — IOHEXOL 300 MG/ML  SOLN
100.0000 mL | Freq: Once | INTRAMUSCULAR | Status: AC | PRN
  Administered 2011-04-29: 100 mL via INTRAVENOUS

## 2011-04-29 NOTE — ED Notes (Signed)
Vomiting again--Dr. Notified and states she will place order for more med.

## 2011-04-29 NOTE — ED Notes (Signed)
Pt c/o nausea,vomiting and headache since Saturday. Unable to keep anything down. Denies diarrhea. Temperature 99.6 yesterday.

## 2011-04-29 NOTE — ED Notes (Signed)
C/o Nausea---Order for Zofran rcd and given.

## 2011-04-29 NOTE — ED Notes (Signed)
Voided approx. 60 ml of tea colored urine--taken to lab for Preg. Screen and u/a

## 2011-04-29 NOTE — ED Notes (Signed)
Assisted to bathroom---voided and back to carrier--steady gait--continues to report feeling much better.

## 2011-04-29 NOTE — ED Notes (Signed)
MD at bedside. Dr Karma Ganja at bedside talked to pt and family, discussed plan of care.

## 2011-04-29 NOTE — ED Notes (Signed)
Doing much better--drinking contrast for CT.

## 2011-04-29 NOTE — ED Notes (Signed)
B/P 136/87  Meds given as ordered.

## 2011-04-29 NOTE — ED Notes (Signed)
No further vomiting---does report feeling slightly nauseated

## 2011-04-29 NOTE — ED Provider Notes (Signed)
History   This chart was scribed for [name] Chick, MD by Clarita Crane. The patient was seen in room [facility] and the patient's care was started at 8:15AM.   CSN: [id number] Arrival date & time: 04/29/2011  8:01 AM   First MD Initiated Contact with Patient 04/29/11 0805      Chief Complaint  Patient presents with  . Emesis    (Consider location/radiation/quality/duration/timing/severity/associated sxs/prior treatment) HPI [name] [name] is a 50 y.o. female who presents to the Emergency Department complaining of constant moderate to severe nausea and vomiting onset 2 days ago and persistent since with associated undocumented fever, HA and abdominal pain described as soreness. Patient reports having approximately 20 episodes of vomiting yesterday. Patient describes HA as a stabbing pain which started last night and has been persistent since. Notes having a h/o migraines but notes current HA is worse than and not similar to previous migraines experienced. Also states she has not had a migraine in several years.  Denies diarrhea and states she had a normal BM last night.   PCP- Dimas Aguas in [place], [place]  Past Medical History  Diagnosis Date  . CAD (coronary artery disease)     S/P cath with stent in 2001  . CVA (cerebral vascular accident)     10  . Pulmonary embolus     1999  . Migraine   . Urinary frequency   . History of kidney stones   . Hiatal hernia     EGD september 2007. normal except for small hiatal hernia. She underwent small bowel biopsy with history of diarrhea at that time. This was negative  . History of colonoscopy     August 2007. She had a pedunculated polyp at 30 cm. which was hamartomatous polyp. She  is due for 5-year followup with family history of coln cancer in a brother at age 86  . History of colonoscopy     2005 revealed a linear ulcer with scar versus inflammatory process at the mid right colon, but normal terminal ileum. A biopsy of this area revealed an  ulceration, but nonspecific  . History of coronary angiogram     CT angiogram in 2007. negative with normal mesenteric arteries    Past Surgical History  Procedure Date  . Cesarean section   . Appendectomy   . Unilateral oophorectomy with hysterectomy   . Tonsillectomy   . Abdominal hysterectomy     Family History  Problem Relation Age of Onset  . Colon cancer Brother 39    died 1 year later  . Heart attack Mother 31  . Diabetes Sister     History  Substance Use Topics  . Smoking status: Current Everyday Smoker -- 0.5 packs/day    Types: Cigarettes  . Smokeless tobacco: Not on file  . Alcohol Use: No    OB History    Grav Para Term Preterm Abortions TAB SAB Ect Mult Living                  Review of Systems 10 Systems reviewed and are negative for acute change except as noted in the HPI.  Allergies  Hydrocodone-acetaminophen  Home Medications   Current Outpatient Rx  Name Route Sig Dispense Refill  . ASPIRIN 81 MG PO TABS Oral Take 81 mg by mouth daily.      Marland Kitchen OMEPRAZOLE 20 MG PO CPDR Oral Take 20 mg by mouth 2 (two) times daily.      Marland Kitchen ONDANSETRON 4 MG PO  TBDP Oral Take 4 mg by mouth every 8 (eight) hours as needed. Nausea, vomiting    . BISACODYL 5 MG PO TBEC Oral Take 10 mg by mouth every 30 (thirty) days.      Marland Kitchen POLYETHYLENE GLYCOL 3350 POWD Does not apply by Does not apply route daily as needed.      Marland Kitchen PROMETHAZINE HCL 25 MG PO TABS Oral Take 25 mg by mouth every 6 (six) hours as needed.        BP 135/88  Pulse 74  Temp(Src) 98.5 F (36.9 C) (Oral)  Resp 18  Ht 5\' 4"  (1.626 m)  Wt 110 lb (49.896 kg)  BMI 18.88 kg/m2  SpO2 99%  Physical Exam  Nursing note and vitals reviewed. Constitutional: She is oriented to person, place, and time. She appears well-developed and well-nourished. No distress.       Uncomfortable appearing.   HENT:  Head: Normocephalic and atraumatic.  Mouth/Throat: Oropharynx is clear and moist.       Mucous membranes dry.     Eyes: EOM are normal. Pupils are equal, round, and reactive to light.  Neck: Neck supple. No tracheal deviation present.       No meningeal signs.  Cardiovascular: Normal rate and regular rhythm.   No murmur heard. Pulmonary/Chest: Effort normal and breath sounds normal. No respiratory distress. She has no wheezes.  Abdominal: Soft. Bowel sounds are normal. She exhibits no distension. There is generalized tenderness.  Musculoskeletal: Normal range of motion. She exhibits no edema.  Neurological: She is alert and oriented to person, place, and time. No cranial nerve deficit or sensory deficit.       Grip strength normal and equal bilaterally. Upper extremity strength normal and equal bilaterally.   Skin: Skin is warm and dry.  Psychiatric: She has a normal mood and affect. Her behavior is normal.    ED Course  Procedures (including critical care time)  DIAGNOSTIC STUDIES: Oxygen Saturation is 98% on room air, normal by my interpretation.    COORDINATION OF CARE: 9:30AM- Nurse informs physician that patient still c/o severe HA.    Labs Reviewed  URINALYSIS, ROUTINE W REFLEX MICROSCOPIC - Abnormal; Notable for the following:    Hgb urine dipstick LARGE (*)    Ketones, ur TRACE (*)    Protein, ur 100 (*)    All other components within normal limits  CBC - Abnormal; Notable for the following:    WBC 22.6 (*)    RBC 5.43 (*)    Hemoglobin 16.4 (*)    HCT 47.2 (*)    All other components within normal limits  COMPREHENSIVE METABOLIC PANEL - Abnormal; Notable for the following:    Glucose, Bld 124 (*)    All other components within normal limits  URINE MICROSCOPIC-ADD ON - Abnormal; Notable for the following:    Squamous Epithelial / LPF FEW (*)    All other components within normal limits  PREGNANCY, URINE  LIPASE, BLOOD   Ct Abdomen Pelvis W Contrast  04/29/2011  *RADIOLOGY REPORT*  Clinical Data: Abdominal pain.  Leukocytosis  CT ABDOMEN AND PELVIS WITH CONTRAST   Technique:  Multidetector CT imaging of the abdomen and pelvis was performed following the standard protocol during bolus administration of intravenous contrast.  Contrast: OMNIPAQUE IOHEXOL 300 MG/ML IV SOLN  Comparison: 11/17/2007 unenhancedCT.  04/01/2006 contrast enhanced CT.  Findings: The previously noted hyperintense lesion within the posterior medial left lobe liver is not appreciated on present exam which may  be related to differences in phases of enhancement. Focal fatty infiltration adjacent to the falciform ligament unchanged.  Prominence of the common bile duct (measures 7.7 mm) and pancreatic duct without significant change.  Cause of this is indeterminate as a discrete calcified common bile duct stone or pancreatic head mass is not identified on present exam.  Question noncalcified gallstones?.  By CT the gallbladder does not appear inflamed.  No focal splenic, adrenal or renal lesion.  Advanced atherosclerotic type changes of the aorta and branch vessels.  No abdominal aortic aneurysm.  Narrowing of the common iliac arteries bilaterally.  Post hysterectomy.  Nondistended views of the urinary bladder without gross abnormality.  Under distended stomach without gross abnormality.  No extraluminal bowel inflammatory process.  Appendix unremarkable.  No free air or free fluid.  Scoliosis and degenerative changes lumbar spine.  No adenopathy.  IMPRESSION: Prominence of the common bile duct (measures 7.7 mm) and pancreatic duct without significant change.  Cause of this is indeterminate as a discrete calcified common bile duct stone or pancreatic head mass is not identified on present exam.  Question noncalcified gallstones?.  By CT the gallbladder does not appear inflamed.  If this is of concern, ultrasound may be considered.  Advanced atherosclerotic type changes.  Under distended stomach and portions of bowel without extraluminal inflammatory process noted.  Scoliosis and degenerative changes.   Please see above.  Original Report Authenticated By: Fuller Canada, M.D.   Dg Abd Acute W/chest  04/29/2011  *RADIOLOGY REPORT*  Clinical Data: Vomiting.  Abdominal pain.  Ulcerative colitis. Hiatal hernia.  ACUTE ABDOMEN SERIES (ABDOMEN 2 VIEW & CHEST 1 VIEW)  Comparison: 11/17/2007; 06/27/2010  Findings: Attenuated peripheral pulmonary vasculature is compatible with emphysema/COPD.  Cardiac and mediastinal contours appear unremarkable.  No pleural effusion is evident.  No free intraperitoneal gas is evident below the hemidiaphragms.  No abnormal air-fluid levels.  No dilated bowel noted.  Lumbar spondylosis is present.  Synovial herniation pit is visible in the right femoral neck.  IMPRESSION:  1.  Emphysema. 2.  Unremarkable bowel gas pattern.  Original Report Authenticated By: Dellia Cloud, M.D.     No diagnosis found.    MDM  2:19 PM  Nausea has improved somewhat.  Pt with leukocytosis, vomiting.  Abdominal CT obtained and results show dilated CBD- ultrasound recommended- due to vomiting, diffuse pain, elevated WBC, will obtain ultrasound now.   Ultrasound results obtained and normal.  Pt with no further vomiting after zofran in ED.  Has tolerated oral fluids.  Pt appears to be improved in terms of hydration, nontoxic in appearance.  Pt is in agreeable with plan for discharge and will arrange for follow up with her primary doctor.     I personally performed the services described in this documentation, which was scribed in my presence. The recorded information has been reviewed and considered.     [name] Chick, MD 04/30/11 [phone]

## 2011-04-29 NOTE — ED Notes (Signed)
Continues to do better--no further vomiting.

## 2011-11-14 ENCOUNTER — Encounter: Payer: Self-pay | Admitting: Internal Medicine

## 2011-11-18 ENCOUNTER — Ambulatory Visit (INDEPENDENT_AMBULATORY_CARE_PROVIDER_SITE_OTHER): Payer: Self-pay | Admitting: Urgent Care

## 2011-11-18 ENCOUNTER — Encounter: Payer: Self-pay | Admitting: Urgent Care

## 2011-11-18 VITALS — BP 94/61 | HR 64 | Temp 98.3°F | Ht 64.0 in | Wt 112.6 lb

## 2011-11-18 DIAGNOSIS — Z8719 Personal history of other diseases of the digestive system: Secondary | ICD-10-CM

## 2011-11-18 DIAGNOSIS — R1013 Epigastric pain: Secondary | ICD-10-CM

## 2011-11-18 DIAGNOSIS — K219 Gastro-esophageal reflux disease without esophagitis: Secondary | ICD-10-CM

## 2011-11-18 DIAGNOSIS — Z800 Family history of malignant neoplasm of digestive organs: Secondary | ICD-10-CM

## 2011-11-18 DIAGNOSIS — R1110 Vomiting, unspecified: Secondary | ICD-10-CM

## 2011-11-18 DIAGNOSIS — R300 Dysuria: Secondary | ICD-10-CM

## 2011-11-18 MED ORDER — ONDANSETRON 4 MG PO TBDP: 4.0000 mg | ORAL_TABLET | Freq: Three times a day (TID) | ORAL | Status: DC | PRN

## 2011-11-18 MED ORDER — DEXLANSOPRAZOLE 60 MG PO CPDR: 60.0000 mg | DELAYED_RELEASE_CAPSULE | Freq: Every day | ORAL | Status: DC

## 2011-11-18 NOTE — Patient Instructions (Addendum)
Stop omeprazole. Start dexilant 60 mg daily 1-800-quit-now for help quitting smoking Be sure to get your labs today Continue Zofran as needed for nausea MiraLax 17 g daily as needed for constipation To ER if severe pain or if you're unable to keep down fluids You are due for colonoscopy given your family history of colon cancer. We will arrange this in the near future as you may need EGD (upper endoscopy) as well.

## 2011-11-18 NOTE — Progress Notes (Signed)
Referring Provider: Selinda Flavin, MD Primary Care Physician:  Selinda Flavin, MD, MD Primary Gastroenterologist:  Dr. Jena Gauss  Chief Complaint  Patient presents with  . Nausea  . Emesis   HPI:  [name] [name] is a 51 y.o. female here as a referral from Dr. Dimas Aguas for recurrent chronic vomiting. She was last evaluated back in 2011 for refractory vomiting and weight loss and was found to have nonerosive esophagitis and 2 duodenal bulbar ulcers. She states she is awakening between 1-3 am and vomiting in bed.  She has been to the emergency department or to see Dr. Dimas Aguas on several occasions for this.  C/o burning & pulling in stomach.  C/o weight loss.  States over 10# in 1 year.  We have a documented 9.6# wt gain since 2011, however.  She is taking prilosec 40mg  daily, but was previously on 20mg  daily until recently.  Zofran helps her nausea.  Chronic symptoms every day.  She eats her last meal of day between 5-6pm.  C/o indigestion couple times/wk.  C/o dysphagia there she feels like food gets stuck in her mid-esophagus & wont go down.  She complains of being hungry all the time, but when she goes to eat she has early satiety.  BM twice per week.  Denies rectal bleeding or melena,.  No NSAIDs aspirin products.    Past Medical History  Diagnosis Date  . CAD (coronary artery disease)     S/P cath with stent in 2001  . CVA (cerebral vascular accident) 69  . Pulmonary embolus 1999  . Migraine   . Urinary frequency   . History of kidney stones   . Hiatal hernia     EGD september 2007. normal except for small hiatal hernia. She underwent small bowel biopsy with history of diarrhea at that time. This was negative  . History of colonoscopy     August 2007. She had a pedunculated polyp at 30 cm. which was hamartomatous polyp. She  is due for 5-year followup with family history of coln cancer in a brother at age 68  . History of colonoscopy     2005 revealed a linear ulcer with scar versus  inflammatory process at the mid right colon, but normal terminal ileum. A biopsy of this area revealed an ulceration, but nonspecific  . History of coronary angiogram     CT angiogram in 2007. negative with normal mesenteric arteries   Past Surgical History  Procedure Date  . Cesarean section   . Appendectomy   . Unilateral oophorectomy with hysterectomy   . Tonsillectomy   . Abdominal hysterectomy   . Esophagogastroduodenoscopy 09/19/09    small HH otherwise normal/tiny distal esophageal erosions with mild erosive reflux  . Coronary angioplasty with stent placement 2011    Dr. Corinda Gub[place], [place]  . Esophagogastroduodenoscopy 03/2006    Rourk-Hiatal hernia, negative small bowel biopsy  . Colonoscopy 01/2006    Rourk-pedunculated polyp 30 cm hamartomatous polyp,  . Colonoscopy 2005    Lineal ulcer or mid right colon, normal TI nonspecific biopsy   Current Outpatient Prescriptions  Medication Sig Dispense Refill  . aspirin 81 MG tablet Take 81 mg by mouth daily.        . bisacodyl (CORRECTIVE LAXATIVE) 5 MG EC tablet Take 10 mg by mouth every 30 (thirty) days.        Marland Kitchen omeprazole (PRILOSEC) 20 MG capsule Take 40 mg by mouth daily.       . ondansetron (ZOFRAN ODT) 4 MG  disintegrating tablet Take 1 tablet (4 mg total) by mouth every 8 (eight) hours as needed. Nausea, vomiting  30 tablet  0  . Probiotic Product (PROBIOTIC FORMULA PO) Take by mouth daily.      Marland Kitchen dexlansoprazole (DEXILANT) 60 MG capsule Take 1 capsule (60 mg total) by mouth daily.  31 capsule  2   Allergies as of 11/18/2011 - Review Complete 11/18/2011  Allergen Reaction Noted  . Hydrocodone-acetaminophen Hives and Nausea And Vomiting    Family History  Problem Relation Age of Onset  . Colon cancer Brother 39    died 1 year later  . Heart attack Mother 44  . Diabetes Sister     History   Social History  . Marital Status: Married    Spouse Name: N/A    Number of Children: 2  . Years of Education: N/A   Occupational  History  . [employer]    Social History Main Topics  . Smoking status: Current Everyday Smoker -- 0.5 packs/day for 35 years    Types: Cigarettes  . Smokeless tobacco: Not on file  . Alcohol Use: No  . Drug Use: No  . Sexually Active: Not on file  Review of Systems: Gen: Denies any fever, chills, sweats, anorexia, fatigue, weakness.  + malaise. CV: Denies chest pain, angina, palpitations, syncope, orthopnea, PND, peripheral edema, and claudication. Resp: Denies dyspnea at rest, dyspnea with exercise, cough, sputum, wheezing, coughing up blood, and pleurisy. GI: Denies vomiting blood, jaundice, and fecal incontinence. GU : c/o dysuria and increased urinary frequency. Denies blood in urine, urinary hesitancy, nocturnal urination, and urinary incontinence. MS: Denies joint pain, limitation of movement, and swelling, stiffness, low back pain, extremity pain. Denies muscle weakness, cramps, atrophy.  Derm: Denies rash, itching, dry skin, hives, moles, warts, or unhealing ulcers.  Psych: Denies depression, anxiety, memory loss, suicidal ideation, hallucinations, paranoia, and confusion. Heme: Denies bruising, bleeding, and enlarged lymph nodes. Neuro:  Denies any headaches, dizziness, paresthesias. Endo:  Denies any problems with DM, thyroid, adrenal function.  Physical Exam: BP 94/61  Pulse 64  Temp(Src) 98.3 F (36.8 C) (Temporal)  Ht 5\' 4"  (1.626 m)  Wt 112 lb 9.6 oz (51.075 kg)  BMI 19.33 kg/m2 General:   Alert,  thin, pleasant and cooperative in NAD.  Accompanied by her husband. Head:  Normocephalic and atraumatic. Eyes:  Sclera clear, no icterus.   Conjunctiva pink. Ears:  Normal auditory acuity. Nose:  No deformity, discharge, or lesions. Mouth:  No deformity or lesions,oropharynx pink & moist. Neck:  Supple; no masses or thyromegaly. Lungs:  Clear throughout to auscultation.   No wheezes, crackles, or rhonchi. No acute distress. Heart:  Regular rate and  rhythm; no murmurs, clicks, rubs,  or gallops. Abdomen:  Normal bowel sounds.  No bruits.  Soft and non-distended.  + Mild epigastric tenderness to palpation. No masses, hepatosplenomegaly or hernias noted.  No guarding or rebound tenderness.   Rectal:  Deferred. Msk:  Symmetrical without gross deformities. Pulses:  Normal pulses noted. Extremities:  No  edema. Neurologic:  Alert and oriented x4;  grossly normal neurologically. Skin:  Intact without significant lesions or rashes. Lymph Nodes:  No significant cervical adenopathy. Psych:  Alert and cooperative. Normal mood and affect.

## 2011-11-19 ENCOUNTER — Encounter: Payer: Self-pay | Admitting: Urgent Care

## 2011-11-19 NOTE — Assessment & Plan Note (Signed)
Poorly controlled.  See epigastric pain. Stop omeprazole. Start dexilant 60 mg daily 1-800-quit-now for help quitting smoking

## 2011-11-19 NOTE — Assessment & Plan Note (Addendum)
[name] [name] is a pleasant 51 y.o. female a history of duodenal ulcers 2011 who presents with recurrent epigastric pain, nausea and vomiting.  I suspect recurrent DU or PUD. Cannot rule out pancreatitis or biliary etiology. Denies any NSAIDs or aspirin products. Suspect she will need EGD in the near future.  I have discussed risks & benefits which include, but are not limited to, bleeding, infection, perforation & drug reaction.  The patient agrees with this plan & written consent will be obtained.    CBC, lipase, CMP, urinalysis with reflux culture Stop omeprazole. Start dexilant 60 mg daily Continue Zofran as needed for nausea To ER if severe pain or if you're unable to keep down fluids

## 2011-11-19 NOTE — Assessment & Plan Note (Signed)
MiraLax 17 g daily as needed for constipation

## 2011-11-19 NOTE — Assessment & Plan Note (Signed)
She is overdue for high-risk surveillance colonoscopy with family history of colon cancer in her brother.  Once acute nausea and vomiting has resolved, will proceed with colonoscopy.  I have discussed risks & benefits which include, but are not limited to, bleeding, infection, perforation & drug reaction.  The patient agrees with this plan & written consent will be obtained.

## 2011-11-20 NOTE — Progress Notes (Signed)
Faxed to PCP

## 2011-12-03 ENCOUNTER — Telehealth: Payer: Self-pay | Admitting: Urgent Care

## 2011-12-03 NOTE — Telephone Encounter (Signed)
Mailed reminder letter to pt. 

## 2011-12-03 NOTE — Telephone Encounter (Signed)
Please see if pt had labs done from last OV?

## 2012-02-05 ENCOUNTER — Encounter (HOSPITAL_COMMUNITY): Payer: Self-pay | Admitting: *Deleted

## 2012-02-05 ENCOUNTER — Emergency Department (HOSPITAL_COMMUNITY): Payer: Self-pay

## 2012-02-05 ENCOUNTER — Emergency Department (HOSPITAL_COMMUNITY)
Admission: EM | Admit: 2012-02-05 | Discharge: 2012-02-05 | Disposition: A | Discharge status: Home / Self Care | Payer: Self-pay | Attending: Emergency Medicine | Admitting: Emergency Medicine

## 2012-02-05 DIAGNOSIS — K2970 Gastritis, unspecified, without bleeding: Secondary | ICD-10-CM | POA: Insufficient documentation

## 2012-02-05 DIAGNOSIS — Z8673 Personal history of transient ischemic attack (TIA), and cerebral infarction without residual deficits: Secondary | ICD-10-CM | POA: Insufficient documentation

## 2012-02-05 DIAGNOSIS — I2510 Atherosclerotic heart disease of native coronary artery without angina pectoris: Secondary | ICD-10-CM | POA: Insufficient documentation

## 2012-02-05 DIAGNOSIS — F17200 Nicotine dependence, unspecified, uncomplicated: Secondary | ICD-10-CM | POA: Insufficient documentation

## 2012-02-05 DIAGNOSIS — Z86711 Personal history of pulmonary embolism: Secondary | ICD-10-CM | POA: Insufficient documentation

## 2012-02-05 LAB — URINALYSIS, ROUTINE W REFLEX MICROSCOPIC
Bilirubin Urine: NEGATIVE
Ketones, ur: NEGATIVE mg/dL
Leukocytes, UA: NEGATIVE
Nitrite: NEGATIVE
Urobilinogen, UA: 0.2 mg/dL (ref 0.0–1.0)

## 2012-02-05 LAB — CBC WITH DIFFERENTIAL/PLATELET
Basophils Relative: 0 % (ref 0–1)
HCT: 47.8 % — ABNORMAL HIGH (ref 36.0–46.0)
Hemoglobin: 17.1 g/dL — ABNORMAL HIGH (ref 12.0–15.0)
Lymphs Abs: 3.1 10*3/uL (ref 0.7–4.0)
MCHC: 35.8 g/dL (ref 30.0–36.0)
Monocytes Absolute: 1.7 10*3/uL — ABNORMAL HIGH (ref 0.1–1.0)
Monocytes Relative: 9 % (ref 3–12)
Neutro Abs: 13.5 10*3/uL — ABNORMAL HIGH (ref 1.7–7.7)
RBC: 5.53 MIL/uL — ABNORMAL HIGH (ref 3.87–5.11)

## 2012-02-05 LAB — COMPREHENSIVE METABOLIC PANEL
ALT: 16 U/L (ref 0–35)
Alkaline Phosphatase: 93 U/L (ref 39–117)
BUN: 13 mg/dL (ref 6–23)
CO2: 26 mEq/L (ref 19–32)
Chloride: 95 mEq/L — ABNORMAL LOW (ref 96–112)
GFR calc Af Amer: >90 mL/min (ref >90)
GFR calc non Af Amer: >90 mL/min (ref >90)
Glucose, Bld: 126 mg/dL — ABNORMAL HIGH (ref 70–99)
Potassium: 3.2 mEq/L — ABNORMAL LOW (ref 3.5–5.1)
Sodium: 135 mEq/L (ref 135–145)
Total Bilirubin: 0.5 mg/dL (ref 0.3–1.2)
Total Protein: 7.6 g/dL (ref 6.0–8.3)

## 2012-02-05 LAB — URINE MICROSCOPIC-ADD ON

## 2012-02-05 MED ORDER — LORAZEPAM 2 MG/ML IJ SOLN
1.0000 mg | Freq: Once | INTRAMUSCULAR | Status: AC
  Administered 2012-02-05: 1 mg via INTRAVENOUS
  Filled 2012-02-05: qty 1

## 2012-02-05 MED ORDER — SODIUM CHLORIDE 0.9 % IV SOLN
1000.0000 mL | Freq: Once | INTRAVENOUS | Status: AC
  Administered 2012-02-05: 1000 mL via INTRAVENOUS

## 2012-02-05 MED ORDER — ONDANSETRON HCL 4 MG/2ML IJ SOLN
4.0000 mg | Freq: Once | INTRAMUSCULAR | Status: AC
  Administered 2012-02-05: 4 mg via INTRAVENOUS
  Filled 2012-02-05: qty 2

## 2012-02-05 MED ORDER — PROMETHAZINE HCL 25 MG/ML IJ SOLN
25.0000 mg | Freq: Once | INTRAMUSCULAR | Status: AC
  Administered 2012-02-05: 25 mg via INTRAMUSCULAR
  Filled 2012-02-05: qty 1

## 2012-02-05 MED ORDER — PROMETHAZINE HCL 25 MG/ML IJ SOLN
25.0000 mg | Freq: Once | INTRAMUSCULAR | Status: AC
  Administered 2012-02-05: 25 mg via INTRAVENOUS
  Filled 2012-02-05: qty 1

## 2012-02-05 MED ORDER — HYDROMORPHONE HCL PF 1 MG/ML IJ SOLN
1.0000 mg | Freq: Once | INTRAMUSCULAR | Status: AC
  Administered 2012-02-05: 1 mg via INTRAVENOUS
  Filled 2012-02-05: qty 1

## 2012-02-05 MED ORDER — FAMOTIDINE IN NACL 20-0.9 MG/50ML-% IV SOLN
20.0000 mg | Freq: Once | INTRAVENOUS | Status: AC
  Administered 2012-02-05: 20 mg via INTRAVENOUS
  Filled 2012-02-05: qty 50

## 2012-02-05 MED ORDER — SODIUM CHLORIDE 0.9 % IV SOLN: 1000.0000 mL | INTRAVENOUS | Status: DC

## 2012-02-05 MED ORDER — PROMETHAZINE HCL 25 MG RE SUPP: 25.0000 mg | Freq: Four times a day (QID) | RECTAL | Status: AC | PRN

## 2012-02-05 MED ORDER — POTASSIUM CHLORIDE CRYS ER 20 MEQ PO TBCR
40.0000 meq | EXTENDED_RELEASE_TABLET | Freq: Once | ORAL | Status: AC
  Administered 2012-02-05: 40 meq via ORAL
  Filled 2012-02-05: qty 2

## 2012-02-05 NOTE — ED Notes (Signed)
Offered pt fluids.  Pt took a few sips.  Pt reports still nauseas.

## 2012-02-05 NOTE — ED Notes (Signed)
Pt states vomiting began at 0400 Monday morning. "This happens a lot because of all the acid" Pt denies diarrhea. States "soreness" to abdomen from vomiting. NAD.

## 2012-02-05 NOTE — ED Provider Notes (Signed)
History     CSN: [id number]  Arrival date & time 02/05/12  0912   First MD Initiated Contact with Patient 02/05/12 [phone]      Chief Complaint  Patient presents with  . Emesis    (Consider location/radiation/quality/duration/timing/severity/associated sxs/prior treatment) HPI Comments: Pt states she began vomiting 0400 2 days ago and estimates ~ 40 episodes.  No diarrhea.  Feels light headed with standing.  Decreased urine output.  No fever or chills.  Denies abdominal pain.  States she is sore from retching.  Taking zofran with no improvement.  Saw dr. Kendell Bane ~ 3 weeks ago for same sxs.  Dx with GERD.  He plans to do endoscopic procedure soon.  Dr. Dimas Aguas is PCP  Patient is a 51 y.o. female presenting with vomiting. The history is provided by the patient. No language interpreter was used.  Emesis  This is a recurrent problem. The problem has not changed since onset.The emesis has an appearance of bilious material. Pertinent negatives include no abdominal pain, no chills, no diarrhea and no fever.    Past Medical History  Diagnosis Date  . CAD (coronary artery disease)     S/P cath with stent in 2001  . CVA (cerebral vascular accident) 23  . Pulmonary embolus 1999  . Migraine   . Urinary frequency   . History of kidney stones   . Hiatal hernia     EGD september 2007. normal except for small hiatal hernia. She underwent small bowel biopsy with history of diarrhea at that time. This was negative  . History of colonoscopy     August 2007. She had a pedunculated polyp at 30 cm. which was hamartomatous polyp. She  is due for 5-year followup with family history of coln cancer in a brother at age 40  . History of colonoscopy     2005 revealed a linear ulcer with scar versus inflammatory process at the mid right colon, but normal terminal ileum. A biopsy of this area revealed an ulceration, but nonspecific  . History of coronary angiogram     CT angiogram in 2007. negative with normal  mesenteric arteries  . DU (duodenal ulcer) 09/19/09    Past Surgical History  Procedure Date  . Cesarean section   . Appendectomy   . Unilateral oophorectomy with hysterectomy   . Tonsillectomy   . Abdominal hysterectomy   . Esophagogastroduodenoscopy 09/19/09    Rourk -2 duodenal bulbar ulcers, small HH otherwise normal/tiny distal esophageal erosions with mild erosive reflux  . Coronary angioplasty with stent placement 2011    Dr. Corinda Gubler, [place]  . Esophagogastroduodenoscopy 03/2006    Rourk-Hiatal hernia, negative small bowel biopsy  . Colonoscopy 01/2006    Rourk-pedunculated polyp 30 cm hamartomatous polyp,  . Colonoscopy 2005    Lineal ulcer or mid right colon, normal TI nonspecific biopsy    Family History  Problem Relation Age of Onset  . Colon cancer Brother 39    died 1 year later  . Heart attack Mother 37  . Diabetes Sister     History  Substance Use Topics  . Smoking status: Current Everyday Smoker -- 0.5 packs/day for 35 years    Types: Cigarettes  . Smokeless tobacco: Not on file  . Alcohol Use: No    OB History    Grav Para Term Preterm Abortions TAB SAB Ect Mult Living                  Review of Systems  Constitutional:  Negative for fever and chills.  Gastrointestinal: Positive for nausea and vomiting. Negative for abdominal pain, diarrhea and abdominal distention.  Neurological: Positive for light-headedness.  Psychiatric/Behavioral: Negative for confusion.  All other systems reviewed and are negative.    Allergies  Hydrocodone-acetaminophen  Home Medications   Current Outpatient Rx  Name Route Sig Dispense Refill  . ALBUTEROL SULFATE HFA 108 (90 BASE) MCG/ACT IN AERS Inhalation Inhale 2 puffs into the lungs every 6 (six) hours as needed.    . ASPIRIN 81 MG PO CHEW Oral Chew 81 mg by mouth daily as needed. FOR CHEST PAIN    . METOCLOPRAMIDE HCL 5 MG PO TABS Oral Take 5 mg by mouth 3 (three) times daily before meals.    . OMEPRAZOLE 40 MG PO  CPDR Oral Take 40 mg by mouth 2 (two) times daily.    Marland Kitchen ONDANSETRON 4 MG PO TBDP Oral Take 8 mg by mouth every 8 (eight) hours as needed. Nausea, vomiting    . PROBIOTIC FORMULA PO Oral Take 1 capsule by mouth daily.     . DEXLANSOPRAZOLE 60 MG PO CPDR Oral Take 1 capsule (60 mg total) by mouth daily. 31 capsule 2  . PROMETHAZINE HCL 25 MG RE SUPP Rectal Place 1 suppository (25 mg total) rectally every 6 (six) hours as needed for nausea. 12 each 0    BP 121/81  Pulse 92  Resp 16  Ht 5\' 4"  (1.626 m)  Wt 100 lb (45.36 kg)  BMI 17.17 kg/m2  SpO2 96%  Physical Exam  Nursing note and vitals reviewed. Constitutional: She is oriented to person, place, and time. She appears well-developed and well-nourished. No distress.  HENT:  Head: Normocephalic and atraumatic.  Eyes: EOM are normal.  Neck: Normal range of motion.  Cardiovascular: Normal rate, regular rhythm and normal heart sounds.   Pulmonary/Chest: Effort normal and breath sounds normal.  Abdominal: Soft. Bowel sounds are normal. She exhibits no distension and no mass. There is no hepatosplenomegaly. There is no tenderness. There is no rigidity, no rebound, no guarding, no tenderness at McBurney's point and negative Murphy's sign.  Musculoskeletal: Normal range of motion.  Neurological: She is alert and oriented to person, place, and time.  Skin: Skin is warm and dry.  Psychiatric: She has a normal mood and affect. Judgment normal.    ED Course  Procedures (including critical care time)  Labs Reviewed  CBC WITH DIFFERENTIAL - Abnormal; Notable for the following:    WBC 18.3 (*)     RBC 5.53 (*)     Hemoglobin 17.1 (*)     HCT 47.8 (*)     Neutro Abs 13.5 (*)     Monocytes Absolute 1.7 (*)     All other components within normal limits  COMPREHENSIVE METABOLIC PANEL - Abnormal; Notable for the following:    Potassium 3.2 (*)     Chloride 95 (*)     Glucose, Bld 126 (*)     All other components within normal limits    URINALYSIS, ROUTINE W REFLEX MICROSCOPIC - Abnormal; Notable for the following:    Hgb urine dipstick MODERATE (*)     Protein, ur TRACE (*)     All other components within normal limits  URINE MICROSCOPIC-ADD ON - Abnormal; Notable for the following:    Squamous Epithelial / LPF FEW (*)     Bacteria, UA FEW (*)     All other components within normal limits  LAB REPORT - SCANNED  No results found. 1430- pt states she is feeling much better.  Has received 1000 ml bolus.  She asked for something to drink which the RN provided along with potassium  40 mEq.  She has retched several times but has not vomited.  She says she would like to go home if we can control the nausea.  She also says she can up with her PCP tomorrow.   1700-d/w dr. Clarene Duke.  The RN has given IM phenergan and i will complete her d/c instructions and rx for phenergan suppositories.  i presume at this time that she will be going home .  Dr. Clarene Duke has assumed her care.  1. Gastritis       MDM  F/u with your MD        Evalina Field, PA 02/10/12 1550

## 2012-02-06 NOTE — ED Provider Notes (Signed)
Medical screening examination/treatment/procedure(s) were conducted as a shared visit with non-physician practitioner(s) and myself.  I personally evaluated the patient during the encounter   51yo F, c/o N/V for the past several days.  Describes her symptoms as "my acid."  Has been eval recently by her GI MD for same, dx GERD, to be scheduled for EGD.  Denies CP/SOB, no back pain, no black or blood in emesis, no diarrhea.  VSS, resps easy, RRR, abd soft/NT.  Udip contaminated, potassium repleted PO.  Not orthostatic.  Feels better after meds and has tol PO well without N/V.  Has gotten herself dressed and wants to go home now.  Dx testing d/w pt.  Questions answered.  Verb understanding, agreeable to d/c home with outpt f/u.     Laray Anger, DO 02/06/12 2042

## 2012-02-11 NOTE — ED Provider Notes (Signed)
Medical screening examination/treatment/procedure(s) were performed by non-physician practitioner and as supervising physician I was immediately available for consultation/collaboration.   Robel Wuertz M Mayan Dolney, DO 02/11/12 1536 

## 2012-06-29 ENCOUNTER — Encounter (HOSPITAL_COMMUNITY): Payer: Self-pay

## 2012-06-29 ENCOUNTER — Emergency Department (HOSPITAL_COMMUNITY)
Admission: EM | Admit: 2012-06-29 | Discharge: 2012-06-30 | Disposition: A | Discharge status: Home / Self Care | Payer: Self-pay | Attending: Emergency Medicine | Admitting: Emergency Medicine

## 2012-06-29 DIAGNOSIS — IMO0002 Reserved for concepts with insufficient information to code with codable children: Secondary | ICD-10-CM | POA: Insufficient documentation

## 2012-06-29 DIAGNOSIS — I2510 Atherosclerotic heart disease of native coronary artery without angina pectoris: Secondary | ICD-10-CM | POA: Insufficient documentation

## 2012-06-29 DIAGNOSIS — Z79899 Other long term (current) drug therapy: Secondary | ICD-10-CM | POA: Insufficient documentation

## 2012-06-29 DIAGNOSIS — Z87448 Personal history of other diseases of urinary system: Secondary | ICD-10-CM | POA: Insufficient documentation

## 2012-06-29 DIAGNOSIS — R51 Headache: Secondary | ICD-10-CM | POA: Insufficient documentation

## 2012-06-29 DIAGNOSIS — Z95818 Presence of other cardiac implants and grafts: Secondary | ICD-10-CM | POA: Insufficient documentation

## 2012-06-29 DIAGNOSIS — R112 Nausea with vomiting, unspecified: Secondary | ICD-10-CM | POA: Insufficient documentation

## 2012-06-29 DIAGNOSIS — Z8673 Personal history of transient ischemic attack (TIA), and cerebral infarction without residual deficits: Secondary | ICD-10-CM | POA: Insufficient documentation

## 2012-06-29 DIAGNOSIS — Z87442 Personal history of urinary calculi: Secondary | ICD-10-CM | POA: Insufficient documentation

## 2012-06-29 DIAGNOSIS — Z86711 Personal history of pulmonary embolism: Secondary | ICD-10-CM | POA: Insufficient documentation

## 2012-06-29 DIAGNOSIS — F17200 Nicotine dependence, unspecified, uncomplicated: Secondary | ICD-10-CM | POA: Insufficient documentation

## 2012-06-29 DIAGNOSIS — Z8719 Personal history of other diseases of the digestive system: Secondary | ICD-10-CM | POA: Insufficient documentation

## 2012-06-29 DIAGNOSIS — Z9889 Other specified postprocedural states: Secondary | ICD-10-CM | POA: Insufficient documentation

## 2012-06-29 LAB — CBC
HCT: 49.1 % — ABNORMAL HIGH (ref 36.0–46.0)
MCH: 30.4 pg (ref 26.0–34.0)
MCV: 85.4 fL (ref 78.0–100.0)
RBC: 5.75 MIL/uL — ABNORMAL HIGH (ref 3.87–5.11)
WBC: 16.2 10*3/uL — ABNORMAL HIGH (ref 4.0–10.5)

## 2012-06-29 LAB — COMPREHENSIVE METABOLIC PANEL
BUN: 15 mg/dL (ref 6–23)
CO2: 30 mEq/L (ref 19–32)
Chloride: 96 mEq/L (ref 96–112)
Creatinine, Ser: 0.66 mg/dL (ref 0.50–1.10)
GFR calc Af Amer: >90 mL/min (ref >90)
GFR calc non Af Amer: >90 mL/min (ref >90)
Total Bilirubin: 0.3 mg/dL (ref 0.3–1.2)

## 2012-06-29 LAB — LIPASE, BLOOD: Lipase: 34 U/L (ref 11–59)

## 2012-06-29 MED ORDER — SODIUM CHLORIDE 0.9 % IV SOLN
1000.0000 mL | Freq: Once | INTRAVENOUS | Status: AC
  Administered 2012-06-29: 1000 mL via INTRAVENOUS

## 2012-06-29 MED ORDER — PANTOPRAZOLE SODIUM 40 MG IV SOLR
40.0000 mg | Freq: Once | INTRAVENOUS | Status: AC
  Administered 2012-06-29: 40 mg via INTRAVENOUS
  Filled 2012-06-29: qty 40

## 2012-06-29 MED ORDER — ONDANSETRON HCL 4 MG/2ML IJ SOLN
4.0000 mg | Freq: Once | INTRAMUSCULAR | Status: AC
  Administered 2012-06-29: 4 mg via INTRAVENOUS
  Filled 2012-06-29: qty 2

## 2012-06-29 MED ORDER — PANTOPRAZOLE SODIUM 40 MG PO TBEC: 40.0000 mg | DELAYED_RELEASE_TABLET | Freq: Once | ORAL | Status: DC

## 2012-06-29 MED ORDER — SODIUM CHLORIDE 0.9 % IV SOLN
1000.0000 mL | INTRAVENOUS | Status: DC
  Administered 2012-06-29: 1000 mL via INTRAVENOUS

## 2012-06-29 MED ORDER — HYDROMORPHONE HCL PF 1 MG/ML IJ SOLN
1.0000 mg | Freq: Once | INTRAMUSCULAR | Status: AC
  Administered 2012-06-29: 1 mg via INTRAVENOUS
  Filled 2012-06-29: qty 1

## 2012-06-29 NOTE — ED Notes (Signed)
Pt woke up this morning with nausea and vomiting. Now c/o headache last vomited upon arrival

## 2012-06-29 NOTE — ED Notes (Signed)
Asked patient to collect urine sample for [place]. Patient states she is unable to void at this time.

## 2012-06-30 LAB — URINALYSIS, ROUTINE W REFLEX MICROSCOPIC
Ketones, ur: 40 mg/dL — AB
Leukocytes, UA: NEGATIVE
Nitrite: NEGATIVE
Protein, ur: 100 mg/dL — AB
Urobilinogen, UA: 0.2 mg/dL (ref 0.0–1.0)

## 2012-06-30 LAB — URINE MICROSCOPIC-ADD ON

## 2012-06-30 MED ORDER — ONDANSETRON HCL 4 MG/2ML IJ SOLN
4.0000 mg | Freq: Once | INTRAMUSCULAR | Status: AC
  Administered 2012-06-30: 4 mg via INTRAVENOUS
  Filled 2012-06-30: qty 2

## 2012-06-30 MED ORDER — HYDROMORPHONE HCL PF 1 MG/ML IJ SOLN
1.0000 mg | Freq: Once | INTRAMUSCULAR | Status: AC
  Administered 2012-06-30: 1 mg via INTRAVENOUS
  Filled 2012-06-30: qty 1

## 2012-06-30 MED ORDER — SODIUM CHLORIDE 0.9 % IV BOLUS (SEPSIS)
1000.0000 mL | Freq: Once | INTRAVENOUS | Status: AC
  Administered 2012-06-30: 1000 mL via INTRAVENOUS

## 2012-06-30 MED ORDER — ONDANSETRON 4 MG PO TBDP: 4.0000 mg | ORAL_TABLET | Freq: Three times a day (TID) | ORAL | Status: DC | PRN

## 2012-06-30 NOTE — ED Provider Notes (Addendum)
History     CSN: [id number]  Arrival date & time 06/29/12  2130   First MD Initiated Contact with Patient 06/29/12 2311      Chief Complaint  Patient presents with  . Emesis    (Consider location/radiation/quality/duration/timing/severity/associated sxs/prior treatment) HPI [name] [name] is a 51 y.o. female who presents to the Emergency Department complaining of vomiting that began at 3 AM this morning and has been persistent all day. It is associated with a headache. Denies vision changes, difficulty talking or swallowing, weakness, diarrhea, urinary symptoms.   PCP Dr. Dimas Aguas Past Medical History  Diagnosis Date  . CAD (coronary artery disease)     S/P cath with stent in 2001  . CVA (cerebral vascular accident) 63  . Pulmonary embolus 1999  . Migraine   . Urinary frequency   . History of kidney stones   . Hiatal hernia     EGD september 2007. normal except for small hiatal hernia. She underwent small bowel biopsy with history of diarrhea at that time. This was negative  . History of colonoscopy     August 2007. She had a pedunculated polyp at 30 cm. which was hamartomatous polyp. She  is due for 5-year followup with family history of coln cancer in a brother at age 22  . History of colonoscopy     2005 revealed a linear ulcer with scar versus inflammatory process at the mid right colon, but normal terminal ileum. A biopsy of this area revealed an ulceration, but nonspecific  . History of coronary angiogram     CT angiogram in 2007. negative with normal mesenteric arteries  . DU (duodenal ulcer) 09/19/09    Past Surgical History  Procedure Date  . Cesarean section   . Appendectomy   . Unilateral oophorectomy with hysterectomy   . Tonsillectomy   . Abdominal hysterectomy   . Esophagogastroduodenoscopy 09/19/09    Rourk -2 duodenal bulbar ulcers, small HH otherwise normal/tiny distal esophageal erosions with mild erosive reflux  . Coronary angioplasty with stent  placement 2011    Dr. Corinda Gubler, [place]  . Esophagogastroduodenoscopy 03/2006    Rourk-Hiatal hernia, negative small bowel biopsy  . Colonoscopy 01/2006    Rourk-pedunculated polyp 30 cm hamartomatous polyp,  . Colonoscopy 2005    Lineal ulcer or mid right colon, normal TI nonspecific biopsy    Family History  Problem Relation Age of Onset  . Colon cancer Brother 39    died 1 year later  . Heart attack Mother 42  . Diabetes Sister     History  Substance Use Topics  . Smoking status: Current Every Day Smoker -- 0.5 packs/day for 35 years    Types: Cigarettes  . Smokeless tobacco: Not on file  . Alcohol Use: No    OB History    Grav Para Term Preterm Abortions TAB SAB Ect Mult Living                  Review of Systems  Constitutional: Negative for fever.       10 Systems reviewed and are negative for acute change except as noted in the HPI.  HENT: Negative for congestion.   Eyes: Negative for discharge and redness.  Respiratory: Negative for cough and shortness of breath.   Cardiovascular: Negative for chest pain.  Gastrointestinal: Positive for nausea and vomiting. Negative for abdominal pain.  Musculoskeletal: Negative for back pain.  Skin: Negative for rash.  Neurological: Positive for headaches. Negative for syncope and numbness.  Psychiatric/Behavioral:       No behavior change.    Allergies  Hydrocodone-acetaminophen  Home Medications   Current Outpatient Rx  Name  Route  Sig  Dispense  Refill  . ALBUTEROL SULFATE HFA 108 (90 BASE) MCG/ACT IN AERS   Inhalation   Inhale 2 puffs into the lungs every 6 (six) hours as needed.         . DEXLANSOPRAZOLE 60 MG PO CPDR   Oral   Take 1 capsule (60 mg total) by mouth daily.   31 capsule   2   . OMEPRAZOLE 40 MG PO CPDR   Oral   Take 40 mg by mouth 2 (two) times daily.         Marland Kitchen ONDANSETRON 8 MG PO TBDP   Oral   Take 8 mg by mouth every 8 (eight) hours as needed. For nausea         . PROBIOTIC FORMULA  PO   Oral   Take 1 capsule by mouth daily.            BP 152/99  Pulse 104  Temp 98.8 F (37.1 C) (Oral)  Resp 20  Ht 5\' 4"  (1.626 m)  Wt 110 lb (49.896 kg)  BMI 18.88 kg/m2  SpO2 99%  Physical Exam  Nursing note and vitals reviewed. Constitutional: She is oriented to person, place, and time.       Awake, alert, nontoxic appearance.  HENT:  Head: Atraumatic.  Eyes: Right eye exhibits no discharge. Left eye exhibits no discharge.  Neck: Neck supple.  Cardiovascular: Normal heart sounds.   Pulmonary/Chest: Effort normal and breath sounds normal. She exhibits no tenderness.  Abdominal: Soft. There is no tenderness. There is no rebound.  Musculoskeletal: She exhibits no tenderness.       Baseline ROM, no obvious new focal weakness.  Neurological: She is alert and oriented to person, place, and time. She has normal reflexes. No cranial nerve deficit.       Mental status and motor strength appears baseline for patient and situation.  Skin: No rash noted.  Psychiatric: She has a normal mood and affect.    ED Course  Procedures (including critical care time) Results for orders placed during the hospital encounter of 06/29/12  CBC      Component Value Range   WBC 16.2 (*) 4.0 - 10.5 K/uL   RBC 5.75 (*) 3.87 - 5.11 MIL/uL   Hemoglobin 17.5 (*) 12.0 - 15.0 g/dL   HCT 16.1 (*) 09.6 - 04.5 %   MCV 85.4  78.0 - 100.0 fL   MCH 30.4  26.0 - 34.0 pg   MCHC 35.6  30.0 - 36.0 g/dL   RDW 40.9  81.1 - 91.4 %   Platelets 282  150 - 400 K/uL  COMPREHENSIVE METABOLIC PANEL      Component Value Range   Sodium 140  135 - 145 mEq/L   Potassium 3.8  3.5 - 5.1 mEq/L   Chloride 96  96 - 112 mEq/L   CO2 30  19 - 32 mEq/L   Glucose, Bld 148 (*) 70 - 99 mg/dL   BUN 15  6 - 23 mg/dL   Creatinine, Ser 7.82  0.50 - 1.10 mg/dL   Calcium 95.6  8.4 - 21.3 mg/dL   Total Protein 8.3  6.0 - 8.3 g/dL   Albumin 4.8  3.5 - 5.2 g/dL   AST 16  0 - 37 U/L   ALT 15  0 -  35 U/L   Alkaline Phosphatase  85  39 - 117 U/L   Total Bilirubin 0.3  0.3 - 1.2 mg/dL   GFR calc non Af Amer >90  >90 mL/min   GFR calc Af Amer >90  >90 mL/min  LIPASE, BLOOD      Component Value Range   Lipase 34  11 - 59 U/L    0110 Patient feeling a bit better. Headache still present and mild nausea. Ordered additional analgesic and antiemetic. 0145 Drink PO fluids. Feels she can go home now.   MDM  Patient presents with vomiting illness that began yesterday morning. Given 2 liters IVF, analgesic x 2, antiemetic x 2 with improvement.Labs with elevated WBC c/w gastroenteritis.  Pt stable in ED with no significant deterioration in condition.The patient appears reasonably screened and/or stabilized for discharge and I doubt any other medical condition or other [facility] requiring further screening, evaluation, or treatment in the ED at this time prior to discharge.  MDM Reviewed: nursing note and vitals Interpretation: labs           Nicoletta Dress. Colon Branch, MD 06/30/12 4098  Nicoletta Dress. Colon Branch, MD 07/15/12 [phone]

## 2012-06-30 NOTE — ED Notes (Signed)
Discharge instructions reviewed with pt, questions answered. Pt verbalized understanding.  

## 2012-12-20 ENCOUNTER — Encounter (HOSPITAL_COMMUNITY): Payer: Self-pay | Admitting: *Deleted

## 2012-12-20 ENCOUNTER — Emergency Department (HOSPITAL_COMMUNITY)
Admission: EM | Admit: 2012-12-20 | Discharge: 2012-12-21 | Disposition: A | Discharge status: Home / Self Care | Payer: Self-pay | Attending: Emergency Medicine | Admitting: Emergency Medicine

## 2012-12-20 DIAGNOSIS — I2510 Atherosclerotic heart disease of native coronary artery without angina pectoris: Secondary | ICD-10-CM | POA: Insufficient documentation

## 2012-12-20 DIAGNOSIS — Z8679 Personal history of other diseases of the circulatory system: Secondary | ICD-10-CM | POA: Insufficient documentation

## 2012-12-20 DIAGNOSIS — K5289 Other specified noninfective gastroenteritis and colitis: Secondary | ICD-10-CM | POA: Insufficient documentation

## 2012-12-20 DIAGNOSIS — Z9861 Coronary angioplasty status: Secondary | ICD-10-CM | POA: Insufficient documentation

## 2012-12-20 DIAGNOSIS — R319 Hematuria, unspecified: Secondary | ICD-10-CM | POA: Insufficient documentation

## 2012-12-20 DIAGNOSIS — R51 Headache: Secondary | ICD-10-CM | POA: Insufficient documentation

## 2012-12-20 DIAGNOSIS — F17200 Nicotine dependence, unspecified, uncomplicated: Secondary | ICD-10-CM | POA: Insufficient documentation

## 2012-12-20 DIAGNOSIS — Z8673 Personal history of transient ischemic attack (TIA), and cerebral infarction without residual deficits: Secondary | ICD-10-CM | POA: Insufficient documentation

## 2012-12-20 DIAGNOSIS — Z87442 Personal history of urinary calculi: Secondary | ICD-10-CM | POA: Insufficient documentation

## 2012-12-20 DIAGNOSIS — Z79899 Other long term (current) drug therapy: Secondary | ICD-10-CM | POA: Insufficient documentation

## 2012-12-20 DIAGNOSIS — K529 Noninfective gastroenteritis and colitis, unspecified: Secondary | ICD-10-CM

## 2012-12-20 DIAGNOSIS — Z8719 Personal history of other diseases of the digestive system: Secondary | ICD-10-CM | POA: Insufficient documentation

## 2012-12-20 DIAGNOSIS — Z8711 Personal history of peptic ulcer disease: Secondary | ICD-10-CM | POA: Insufficient documentation

## 2012-12-20 DIAGNOSIS — R5381 Other malaise: Secondary | ICD-10-CM | POA: Insufficient documentation

## 2012-12-20 DIAGNOSIS — Z86711 Personal history of pulmonary embolism: Secondary | ICD-10-CM | POA: Insufficient documentation

## 2012-12-20 DIAGNOSIS — Z9889 Other specified postprocedural states: Secondary | ICD-10-CM | POA: Insufficient documentation

## 2012-12-20 LAB — URINALYSIS, ROUTINE W REFLEX MICROSCOPIC
Bilirubin Urine: NEGATIVE
Ketones, ur: 40 mg/dL — AB
Protein, ur: NEGATIVE mg/dL
Urobilinogen, UA: 0.2 mg/dL (ref 0.0–1.0)

## 2012-12-20 LAB — COMPREHENSIVE METABOLIC PANEL
ALT: 11 U/L (ref 0–35)
Alkaline Phosphatase: 86 U/L (ref 39–117)
CO2: 26 mEq/L (ref 19–32)
GFR calc Af Amer: >90 mL/min (ref >90)
Glucose, Bld: 138 mg/dL — ABNORMAL HIGH (ref 70–99)
Potassium: 3.2 mEq/L — ABNORMAL LOW (ref 3.5–5.1)
Sodium: 139 mEq/L (ref 135–145)
Total Protein: 7.7 g/dL (ref 6.0–8.3)

## 2012-12-20 LAB — CBC WITH DIFFERENTIAL/PLATELET
Basophils Absolute: 0 10*3/uL (ref 0.0–0.1)
Basophils Relative: 1 % (ref 0–1)
Eosinophils Absolute: 0 10*3/uL (ref 0.0–0.7)
Eosinophils Relative: 0 % (ref 0–5)
HCT: 44.1 % (ref 36.0–46.0)
MCHC: 35.1 g/dL (ref 30.0–36.0)
MCV: 87.3 fL (ref 78.0–100.0)
Monocytes Absolute: 0.9 10*3/uL (ref 0.1–1.0)
Neutro Abs: 6.4 10*3/uL (ref 1.7–7.7)
RDW: 13.5 % (ref 11.5–15.5)

## 2012-12-20 MED ORDER — PANTOPRAZOLE SODIUM 40 MG IV SOLR
40.0000 mg | Freq: Once | INTRAVENOUS | Status: AC
  Administered 2012-12-20: 40 mg via INTRAVENOUS
  Filled 2012-12-20: qty 40

## 2012-12-20 MED ORDER — PROMETHAZINE HCL 25 MG/ML IJ SOLN
12.5000 mg | Freq: Once | INTRAMUSCULAR | Status: AC
  Administered 2012-12-20: 12.5 mg via INTRAVENOUS
  Filled 2012-12-20: qty 1

## 2012-12-20 MED ORDER — HYDROMORPHONE HCL PF 1 MG/ML IJ SOLN
1.0000 mg | Freq: Once | INTRAMUSCULAR | Status: AC
  Administered 2012-12-20: 1 mg via INTRAVENOUS
  Filled 2012-12-20: qty 1

## 2012-12-20 MED ORDER — PROMETHAZINE HCL 25 MG PO TABS: 25.0000 mg | ORAL_TABLET | Freq: Four times a day (QID) | ORAL | Status: DC | PRN

## 2012-12-20 MED ORDER — SODIUM CHLORIDE 0.9 % IV BOLUS (SEPSIS)
1000.0000 mL | Freq: Once | INTRAVENOUS | Status: AC
  Administered 2012-12-20: 1000 mL via INTRAVENOUS

## 2012-12-20 MED ORDER — ONDANSETRON HCL 4 MG/2ML IJ SOLN
4.0000 mg | Freq: Once | INTRAMUSCULAR | Status: AC
  Administered 2012-12-20: 4 mg via INTRAVENOUS
  Filled 2012-12-20: qty 2

## 2012-12-20 NOTE — ED Notes (Signed)
Pt has had n/v all day but has gotten worse in the last 3 hours. Pt also c/o headache.

## 2012-12-20 NOTE — ED Provider Notes (Signed)
History  This chart was scribed for [name] Hutching, MD by Manuela Schwartz, ED scribe. This patient was seen in room APA11/APA11 and the patient's care was started at 1906.    CSN: [id number]  Arrival date & time 12/20/12  1906   First MD Initiated Contact with Patient 12/20/12 1916      Chief Complaint  Patient presents with  . Nausea  . Emesis  . Weakness  . Headache   Patient is a 52 y.o. female presenting with vomiting. The history is provided by the patient. No language interpreter was used.  Emesis Severity:  Moderate Duration:  6 hours Timing:  Constant Quality:  Stomach contents Progression:  Worsening Chronicity:  Recurrent Relieved by:  Nothing Worsened by:  Nothing tried Ineffective treatments:  None tried Associated symptoms: no abdominal pain and no chills    HPI Comments: [name] [name] is a 52 y.o. female who presents to the Emergency Department complaining of constant gradually worsening nausea/emesis onset today and worsened over past several hours. Actively vomiting in ED room and in mild distress. She denies abdominal pain and states a hx of stomach ulcers.      Past Medical History  Diagnosis Date  . CAD (coronary artery disease)     S/P cath with stent in 2001  . CVA (cerebral vascular accident) 33  . Pulmonary embolus 1999  . Migraine   . Urinary frequency   . History of kidney stones   . Hiatal hernia     EGD september 2007. normal except for small hiatal hernia. She underwent small bowel biopsy with history of diarrhea at that time. This was negative  . History of colonoscopy     August 2007. She had a pedunculated polyp at 30 cm. which was hamartomatous polyp. She  is due for 5-year followup with family history of coln cancer in a brother at age 31  . History of colonoscopy     2005 revealed a linear ulcer with scar versus inflammatory process at the mid right colon, but normal terminal ileum. A biopsy of this area revealed an ulceration, but  nonspecific  . History of coronary angiogram     CT angiogram in 2007. negative with normal mesenteric arteries  . DU (duodenal ulcer) 09/19/09    Past Surgical History  Procedure Laterality Date  . Cesarean section    . Appendectomy    . Unilateral oophorectomy with hysterectomy    . Tonsillectomy    . Abdominal hysterectomy    . Esophagogastroduodenoscopy  09/19/09    Rourk -2 duodenal bulbar ulcers, small HH otherwise normal/tiny distal esophageal erosions with mild erosive reflux  . Coronary angioplasty with stent placement  2011    Dr. Corinda Gubler, [place]  . Esophagogastroduodenoscopy  03/2006    Rourk-Hiatal hernia, negative small bowel biopsy  . Colonoscopy  01/2006    Rourk-pedunculated polyp 30 cm hamartomatous polyp,  . Colonoscopy  2005    Lineal ulcer or mid right colon, normal TI nonspecific biopsy    Family History  Problem Relation Age of Onset  . Colon cancer Brother 39    died 1 year later  . Heart attack Mother 60  . Diabetes Sister     History  Substance Use Topics  . Smoking status: Current Every Day Smoker -- 0.50 packs/day for 35 years    Types: Cigarettes  . Smokeless tobacco: Not on file  . Alcohol Use: No    OB History   Grav Para Term Preterm Abortions  TAB SAB Ect Mult Living                  Review of Systems  Constitutional: Negative for fever and chills.  Respiratory: Negative for shortness of breath.   Gastrointestinal: Positive for nausea and vomiting. Negative for abdominal pain.  Neurological: Negative for weakness.  All other systems reviewed and are negative.   A complete 10 system review of systems was obtained and all systems are negative except as noted in the HPI and PMH.   Allergies  Hydrocodone-acetaminophen  Home Medications   Current Outpatient Rx  Name  Route  Sig  Dispense  Refill  . albuterol (PROVENTIL HFA;VENTOLIN HFA) 108 (90 BASE) MCG/ACT inhaler   Inhalation   Inhale 2 puffs into the lungs every 6 (six) hours  as needed.         Marland Kitchen EXPIRED: dexlansoprazole (DEXILANT) 60 MG capsule   Oral   Take 1 capsule (60 mg total) by mouth daily.   31 capsule   2   . omeprazole (PRILOSEC) 40 MG capsule   Oral   Take 40 mg by mouth 2 (two) times daily.         . ondansetron (ZOFRAN ODT) 4 MG disintegrating tablet   Oral   Take 1 tablet (4 mg total) by mouth every 8 (eight) hours as needed for nausea.   20 tablet   0   . ondansetron (ZOFRAN-ODT) 8 MG disintegrating tablet   Oral   Take 8 mg by mouth every 8 (eight) hours as needed. For nausea         . Probiotic Product (PROBIOTIC FORMULA PO)   Oral   Take 1 capsule by mouth daily.            Triage Vitals: BP 147/99  Pulse 81  Temp(Src) 98.6 F (37 C)  Resp 20  Ht 5\' 4"  (1.626 m)  Wt 110 lb (49.896 kg)  BMI 18.87 kg/m2  SpO2 100%  Physical Exam  Nursing note and vitals reviewed. Constitutional: She is oriented to person, place, and time. She appears well-developed and well-nourished.  Actively vomiting stomach contents in room  HENT:  Head: Normocephalic and atraumatic.  Eyes: Conjunctivae and EOM are normal. Pupils are equal, round, and reactive to light.  Neck: Normal range of motion. Neck supple.  Cardiovascular: Normal rate, regular rhythm and normal heart sounds.   Pulmonary/Chest: Effort normal and breath sounds normal.  Abdominal: Soft. Bowel sounds are normal.  Musculoskeletal: Normal range of motion.  Neurological: She is alert and oriented to person, place, and time.  Skin: Skin is warm and dry.  Psychiatric: She has a normal mood and affect.    ED Course  Procedures (including critical care time) DIAGNOSTIC STUDIES: Oxygen Saturation is 100% on room air, normal by my interpretation.    COORDINATION OF CARE: At 720 PM Discussed treatment plan with patient which includes IV fluids, zofran, protonix, blood work, UA. Patient agrees.   Labs Reviewed  COMPREHENSIVE METABOLIC PANEL  CBC WITH DIFFERENTIAL   LIPASE, BLOOD  URINALYSIS, ROUTINE W REFLEX MICROSCOPIC   No results found. Results for orders placed during the hospital encounter of 06/29/12  CBC      Result Value Range   WBC 16.2 (*) 4.0 - 10.5 K/uL   RBC 5.75 (*) 3.87 - 5.11 MIL/uL   Hemoglobin 17.5 (*) 12.0 - 15.0 g/dL   HCT 16.1 (*) 09.6 - 04.5 %   MCV 85.4  78.0 - 100.0  fL   MCH 30.4  26.0 - 34.0 pg   MCHC 35.6  30.0 - 36.0 g/dL   RDW 16.1  09.6 - 04.5 %   Platelets 282  150 - 400 K/uL  COMPREHENSIVE METABOLIC PANEL      Result Value Range   Sodium 140  135 - 145 mEq/L   Potassium 3.8  3.5 - 5.1 mEq/L   Chloride 96  96 - 112 mEq/L   CO2 30  19 - 32 mEq/L   Glucose, Bld 148 (*) 70 - 99 mg/dL   BUN 15  6 - 23 mg/dL   Creatinine, Ser 4.09  0.50 - 1.10 mg/dL   Calcium 81.1  8.4 - 91.4 mg/dL   Total Protein 8.3  6.0 - 8.3 g/dL   Albumin 4.8  3.5 - 5.2 g/dL   AST 16  0 - 37 U/L   ALT 15  0 - 35 U/L   Alkaline Phosphatase 85  39 - 117 U/L   Total Bilirubin 0.3  0.3 - 1.2 mg/dL   GFR calc non Af Amer >90  >90 mL/min   GFR calc Af Amer >90  >90 mL/min  LIPASE, BLOOD      Result Value Range   Lipase 34  11 - 59 U/L  URINALYSIS, ROUTINE W REFLEX MICROSCOPIC      Result Value Range   Color, Urine YELLOW  YELLOW   APPearance CLEAR  CLEAR   Specific Gravity, Urine 1.025  1.005 - 1.030   pH 6.5  5.0 - 8.0   Glucose, UA NEGATIVE  NEGATIVE mg/dL   Hgb urine dipstick MODERATE (*) NEGATIVE   Bilirubin Urine NEGATIVE  NEGATIVE   Ketones, ur 40 (*) NEGATIVE mg/dL   Protein, ur 782 (*) NEGATIVE mg/dL   Urobilinogen, UA 0.2  0.0 - 1.0 mg/dL   Nitrite NEGATIVE  NEGATIVE   Leukocytes, UA NEGATIVE  NEGATIVE  URINE MICROSCOPIC-ADD ON      Result Value Range   Squamous Epithelial / LPF FEW (*) RARE   WBC, UA 0-2  <3 WBC/hpf   RBC / HPF 7-10  <3 RBC/hpf   Bacteria, UA FEW (*) RARE   Casts GRANULAR CAST (*) NEGATIVE   Urine-Other MUCOUS PRESENT     No results found.    No diagnosis found.    MDM   Patient feels much  better after IV fluids, IV Zofran. No acute abdomen. Discharge meds Phenergan 25 mg #20     I personally performed the services described in this documentation, which was scribed in my presence. The recorded information has been reviewed and is accurate.          [name] Hutching, MD 12/20/12 2314

## 2012-12-20 NOTE — ED Notes (Signed)
Pt states she has taken several of her PO Zofran 8 mg at home with no relief.

## 2013-01-11 DIAGNOSIS — J438 Other emphysema: Secondary | ICD-10-CM | POA: Insufficient documentation

## 2014-02-14 DIAGNOSIS — F17200 Nicotine dependence, unspecified, uncomplicated: Secondary | ICD-10-CM | POA: Insufficient documentation

## 2014-04-09 ENCOUNTER — Encounter (HOSPITAL_COMMUNITY): Payer: Self-pay | Admitting: Emergency Medicine

## 2014-04-09 ENCOUNTER — Inpatient Hospital Stay (HOSPITAL_COMMUNITY)
Admission: EM | Admit: 2014-04-09 | Discharge: 2014-04-11 | DRG: 194 | Disposition: A | Discharge status: Home / Self Care | Payer: Self-pay | Attending: Internal Medicine | Admitting: Internal Medicine

## 2014-04-09 ENCOUNTER — Emergency Department (HOSPITAL_COMMUNITY): Payer: Self-pay

## 2014-04-09 DIAGNOSIS — J45909 Unspecified asthma, uncomplicated: Secondary | ICD-10-CM | POA: Diagnosis present

## 2014-04-09 DIAGNOSIS — F17210 Nicotine dependence, cigarettes, uncomplicated: Secondary | ICD-10-CM | POA: Diagnosis present

## 2014-04-09 DIAGNOSIS — Z86711 Personal history of pulmonary embolism: Secondary | ICD-10-CM

## 2014-04-09 DIAGNOSIS — Z8249 Family history of ischemic heart disease and other diseases of the circulatory system: Secondary | ICD-10-CM

## 2014-04-09 DIAGNOSIS — Z79899 Other long term (current) drug therapy: Secondary | ICD-10-CM

## 2014-04-09 DIAGNOSIS — F17200 Nicotine dependence, unspecified, uncomplicated: Secondary | ICD-10-CM

## 2014-04-09 DIAGNOSIS — J209 Acute bronchitis, unspecified: Secondary | ICD-10-CM | POA: Diagnosis present

## 2014-04-09 DIAGNOSIS — J441 Chronic obstructive pulmonary disease with (acute) exacerbation: Secondary | ICD-10-CM | POA: Diagnosis present

## 2014-04-09 DIAGNOSIS — R739 Hyperglycemia, unspecified: Secondary | ICD-10-CM | POA: Diagnosis present

## 2014-04-09 DIAGNOSIS — J189 Pneumonia, unspecified organism: Principal | ICD-10-CM | POA: Diagnosis present

## 2014-04-09 DIAGNOSIS — Z800 Family history of malignant neoplasm of digestive organs: Secondary | ICD-10-CM

## 2014-04-09 DIAGNOSIS — Z792 Long term (current) use of antibiotics: Secondary | ICD-10-CM

## 2014-04-09 DIAGNOSIS — Z23 Encounter for immunization: Secondary | ICD-10-CM

## 2014-04-09 DIAGNOSIS — Z7952 Long term (current) use of systemic steroids: Secondary | ICD-10-CM

## 2014-04-09 DIAGNOSIS — J440 Chronic obstructive pulmonary disease with acute lower respiratory infection: Secondary | ICD-10-CM | POA: Diagnosis present

## 2014-04-09 DIAGNOSIS — Z8673 Personal history of transient ischemic attack (TIA), and cerebral infarction without residual deficits: Secondary | ICD-10-CM

## 2014-04-09 DIAGNOSIS — Z833 Family history of diabetes mellitus: Secondary | ICD-10-CM

## 2014-04-09 DIAGNOSIS — I2510 Atherosclerotic heart disease of native coronary artery without angina pectoris: Secondary | ICD-10-CM | POA: Diagnosis present

## 2014-04-09 LAB — CBC WITH DIFFERENTIAL/PLATELET
Basophils Absolute: 0 10*3/uL (ref 0.0–0.1)
Basophils Relative: 0 % (ref 0–1)
Eosinophils Absolute: 0.6 10*3/uL (ref 0.0–0.7)
Eosinophils Relative: 5 % (ref 0–5)
HCT: 38.1 % (ref 36.0–46.0)
Hemoglobin: 12.3 g/dL (ref 12.0–15.0)
LYMPHS ABS: 0.5 10*3/uL — AB (ref 0.7–4.0)
LYMPHS PCT: 4 % — AB (ref 12–46)
MCH: 29.6 pg (ref 26.0–34.0)
MCHC: 32.3 g/dL (ref 30.0–36.0)
MCV: 91.6 fL (ref 78.0–100.0)
Monocytes Absolute: 0.4 10*3/uL (ref 0.1–1.0)
Monocytes Relative: 3 % (ref 3–12)
NEUTROS PCT: 88 % — AB (ref 43–77)
Neutro Abs: 11.1 10*3/uL — ABNORMAL HIGH (ref 1.7–7.7)
PLATELETS: 230 10*3/uL (ref 150–400)
RBC: 4.16 MIL/uL (ref 3.87–5.11)
RDW: 13.6 % (ref 11.5–15.5)
WBC: 12.6 10*3/uL — AB (ref 4.0–10.5)

## 2014-04-09 LAB — COMPREHENSIVE METABOLIC PANEL
ALT: 14 U/L (ref 0–35)
AST: 17 U/L (ref 0–37)
Albumin: 3.3 g/dL — ABNORMAL LOW (ref 3.5–5.2)
Alkaline Phosphatase: 110 U/L (ref 39–117)
Anion gap: 13 (ref 5–15)
BUN: 8 mg/dL (ref 6–23)
CO2: 26 meq/L (ref 19–32)
Calcium: 8.6 mg/dL (ref 8.4–10.5)
Chloride: 101 mEq/L (ref 96–112)
Creatinine, Ser: 0.67 mg/dL (ref 0.50–1.10)
GLUCOSE: 160 mg/dL — AB (ref 70–99)
POTASSIUM: 4 meq/L (ref 3.7–5.3)
SODIUM: 140 meq/L (ref 137–147)
Total Bilirubin: <0.2 mg/dL — ABNORMAL LOW (ref 0.3–1.2)
Total Protein: 6.5 g/dL (ref 6.0–8.3)

## 2014-04-09 LAB — TROPONIN I
Troponin I: <0.3 ng/mL (ref <0.30)
Troponin I: <0.3 ng/mL (ref <0.30)

## 2014-04-09 LAB — D-DIMER, QUANTITATIVE: D-Dimer, Quant: 0.38 ug/mL-FEU (ref 0.00–0.48)

## 2014-04-09 LAB — PRO B NATRIURETIC PEPTIDE: Pro B Natriuretic peptide (BNP): 691.4 pg/mL — ABNORMAL HIGH (ref 0–125)

## 2014-04-09 MED ORDER — MORPHINE SULFATE 4 MG/ML IJ SOLN
4.0000 mg | Freq: Once | INTRAMUSCULAR | Status: AC
  Administered 2014-04-09: 4 mg via INTRAVENOUS
  Filled 2014-04-09: qty 1

## 2014-04-09 MED ORDER — ACETAMINOPHEN 325 MG PO TABS: 650.0000 mg | ORAL_TABLET | Freq: Four times a day (QID) | ORAL | Status: DC | PRN

## 2014-04-09 MED ORDER — LEVOFLOXACIN IN D5W 500 MG/100ML IV SOLN
500.0000 mg | INTRAVENOUS | Status: DC
  Administered 2014-04-09: 500 mg via INTRAVENOUS
  Filled 2014-04-09 (×2): qty 100

## 2014-04-09 MED ORDER — SODIUM CHLORIDE 0.9 % IV SOLN: 250.0000 mL | INTRAVENOUS | Status: DC | PRN

## 2014-04-09 MED ORDER — KETOROLAC TROMETHAMINE 30 MG/ML IJ SOLN
30.0000 mg | Freq: Once | INTRAMUSCULAR | Status: AC
  Administered 2014-04-09: 30 mg via INTRAVENOUS
  Filled 2014-04-09: qty 1

## 2014-04-09 MED ORDER — IPRATROPIUM BROMIDE 0.02 % IN SOLN: 0.5000 mg | Freq: Once | RESPIRATORY_TRACT | Status: DC

## 2014-04-09 MED ORDER — IPRATROPIUM-ALBUTEROL 0.5-2.5 (3) MG/3ML IN SOLN
3.0000 mL | Freq: Once | RESPIRATORY_TRACT | Status: AC
  Administered 2014-04-09: 3 mL via RESPIRATORY_TRACT
  Filled 2014-04-09: qty 3

## 2014-04-09 MED ORDER — SODIUM CHLORIDE 0.9 % IJ SOLN: 3.0000 mL | INTRAMUSCULAR | Status: DC | PRN

## 2014-04-09 MED ORDER — OXYCODONE-ACETAMINOPHEN 5-325 MG PO TABS
1.0000 | ORAL_TABLET | ORAL | Status: DC | PRN
Start: 2014-04-10 — End: 2014-04-11
  Administered 2014-04-10 – 2014-04-11 (×3): 2 via ORAL
  Filled 2014-04-09 (×3): qty 2

## 2014-04-09 MED ORDER — ACETAMINOPHEN 650 MG RE SUPP: 650.0000 mg | Freq: Four times a day (QID) | RECTAL | Status: DC | PRN

## 2014-04-09 MED ORDER — SODIUM CHLORIDE 0.9 % IV BOLUS (SEPSIS)
500.0000 mL | Freq: Once | INTRAVENOUS | Status: AC
  Administered 2014-04-09: 500 mL via INTRAVENOUS

## 2014-04-09 MED ORDER — OXYCODONE-ACETAMINOPHEN 5-325 MG PO TABS
2.0000 | ORAL_TABLET | Freq: Once | ORAL | Status: AC
  Administered 2014-04-09: 2 via ORAL
  Filled 2014-04-09: qty 2

## 2014-04-09 MED ORDER — INFLUENZA VAC SPLIT QUAD 0.5 ML IM SUSY
0.5000 mL | PREFILLED_SYRINGE | INTRAMUSCULAR | Status: AC
  Administered 2014-04-10: 0.5 mL via INTRAMUSCULAR
  Filled 2014-04-09: qty 0.5

## 2014-04-09 MED ORDER — SODIUM CHLORIDE 0.9 % IJ SOLN
3.0000 mL | Freq: Two times a day (BID) | INTRAMUSCULAR | Status: DC
  Administered 2014-04-10 (×2): 3 mL via INTRAVENOUS

## 2014-04-09 MED ORDER — ALBUTEROL SULFATE (2.5 MG/3ML) 0.083% IN NEBU
2.5000 mg | INHALATION_SOLUTION | Freq: Once | RESPIRATORY_TRACT | Status: AC
  Administered 2014-04-09: 2.5 mg via RESPIRATORY_TRACT
  Filled 2014-04-09: qty 3

## 2014-04-09 MED ORDER — ALBUTEROL SULFATE (2.5 MG/3ML) 0.083% IN NEBU: 5.0000 mg | INHALATION_SOLUTION | Freq: Once | RESPIRATORY_TRACT | Status: DC

## 2014-04-09 MED ORDER — METHYLPREDNISOLONE SODIUM SUCC 125 MG IJ SOLR
125.0000 mg | Freq: Once | INTRAMUSCULAR | Status: AC
  Administered 2014-04-09: 125 mg via INTRAVENOUS
  Filled 2014-04-09: qty 2

## 2014-04-09 MED ORDER — PNEUMOCOCCAL VAC POLYVALENT 25 MCG/0.5ML IJ INJ
0.5000 mL | INJECTION | INTRAMUSCULAR | Status: AC
  Administered 2014-04-10: 0.5 mL via INTRAMUSCULAR
  Filled 2014-04-09: qty 0.5

## 2014-04-09 MED ORDER — SODIUM CHLORIDE 0.9 % IJ SOLN
3.0000 mL | Freq: Two times a day (BID) | INTRAMUSCULAR | Status: DC
  Administered 2014-04-10 – 2014-04-11 (×2): 3 mL via INTRAVENOUS

## 2014-04-09 MED ORDER — HYDROMORPHONE HCL 1 MG/ML IJ SOLN
1.0000 mg | INTRAMUSCULAR | Status: AC | PRN
  Administered 2014-04-09 – 2014-04-10 (×2): 1 mg via INTRAVENOUS
  Filled 2014-04-09 (×2): qty 1

## 2014-04-09 MED ORDER — ONDANSETRON HCL 4 MG/2ML IJ SOLN: 4.0000 mg | Freq: Four times a day (QID) | INTRAMUSCULAR | Status: DC | PRN

## 2014-04-09 MED ORDER — VANCOMYCIN HCL IN DEXTROSE 750-5 MG/150ML-% IV SOLN
750.0000 mg | Freq: Two times a day (BID) | INTRAVENOUS | Status: DC
  Administered 2014-04-09 – 2014-04-10 (×2): 750 mg via INTRAVENOUS
  Filled 2014-04-09 (×4): qty 150

## 2014-04-09 MED ORDER — VANCOMYCIN HCL IN DEXTROSE 750-5 MG/150ML-% IV SOLN
INTRAVENOUS | Status: AC
  Filled 2014-04-09: qty 150

## 2014-04-09 MED ORDER — ENOXAPARIN SODIUM 40 MG/0.4ML ~~LOC~~ SOLN
40.0000 mg | SUBCUTANEOUS | Status: DC
  Administered 2014-04-10: 40 mg via SUBCUTANEOUS
  Filled 2014-04-09: qty 0.4

## 2014-04-09 NOTE — ED Notes (Signed)
MD at bedside. 

## 2014-04-09 NOTE — ED Notes (Signed)
Cough, congestion x 5 days.  Currently being treated for PNA with 2nd abx.

## 2014-04-09 NOTE — Progress Notes (Signed)
ANTIBIOTIC CONSULT NOTE  Pharmacy Consult for Vancomycin Indication: pneumonia  Allergies  Allergen Reactions  . Hydrocodone-Acetaminophen Hives, Nausea And Vomiting and Anxiety    Patient Measurements: Height: 5\' 4"  (162.6 cm) Weight: 134 lb (60.782 kg) IBW/kg (Calculated) : 54.7  Vital Signs: Temp: 98.6 F (37 C) (10/10 1835) Temp Source: Oral (10/10 1554) BP: 106/63 mmHg (10/10 1835) Pulse Rate: 91 (10/10 1835) Intake/Output from previous day:   Intake/Output from this shift:    Labs:  Recent Labs  04/09/14 1615  WBC 12.6*  HGB 12.3  PLT 230  CREATININE 0.67   Estimated Creatinine Clearance: 69.4 ml/min (by C-G formula based on Cr of 0.67). No results found for this basename: VANCOTROUGH, VANCOPEAK, VANCORANDOM, GENTTROUGH, GENTPEAK, GENTRANDOM, TOBRATROUGH, TOBRAPEAK, TOBRARND, AMIKACINPEAK, AMIKACINTROU, AMIKACIN,  in the last 72 hours   Microbiology: No results found for this or any previous visit (from the past 720 hour(s)).  Anti-infectives   Start     Dose/Rate Route Frequency Ordered Stop   04/09/14 2000  vancomycin (VANCOCIN) IVPB 750 mg/150 ml premix     750 mg 150 mL/hr over 60 Minutes Intravenous Every 12 hours 04/09/14 1846     04/09/14 1745  levofloxacin (LEVAQUIN) IVPB 500 mg     500 mg 100 mL/hr over 60 Minutes Intravenous Every 24 hours 04/09/14 1738        Assessment: 52 yoF with hx COPD who presented with burning in her chest, productive cough, and shortness of breath that has worsened despite outpatient courses of amoxicillin & Levaquin. She is currently afebrile with mildly elevated WBC.  CXR + LLL PNA & bronchitis.   Renal function is at patient's baseline.   Levaquin 10/7>> Vancomycin 10/10>>  Goal of Therapy:  Vancomycin trough level 15-20 mcg/ml  Plan:  Vancomycin 750mg  IV q12h Continue Levaquin Check Vancomycin trough at steady state Monitor renal function and cx data   Elson ClanLilliston, Shauntia Levengood Michelle 04/09/2014,6:46  PM

## 2014-04-09 NOTE — H&P (Signed)
[name] [name] is an 53 y.o. female.    Pcp: [facility]  [place]  Chief Complaint: cough HPI: 53 yo female with CAD, Copd, apparently c/o cough with brown sputum over the past week,  + fever, subjective, and slight wheezing,cp (sharp, w palpation) sob, n/v.  Pt denies any bloody emesis or abd abdominal pain, diarrhea, constipation, brbpr, black stool.  Pt apparently failed outpatient levaquin, and presented to the Ed for evaluation.  CXR showed LLL bronchopneumonia.  Pt will be admitted for pneumonia, chest pain.   Past Medical History  Diagnosis Date  . CAD (coronary artery disease)     S/P cath with stent in 2001  . CVA (cerebral vascular accident) 56  . Pulmonary embolus 1999  . Migraine   . Urinary frequency   . History of kidney stones   . Hiatal hernia     EGD september 2007. normal except for small hiatal hernia. She underwent small bowel biopsy with history of diarrhea at that time. This was negative  . History of colonoscopy     August 2007. She had a pedunculated polyp at 30 cm. which was hamartomatous polyp. She  is due for 5-year followup with family history of coln cancer in a brother at age 23  . History of colonoscopy     2005 revealed a linear ulcer with scar versus inflammatory process at the mid right colon, but normal terminal ileum. A biopsy of this area revealed an ulceration, but nonspecific  . History of coronary angiogram     CT angiogram in 2007. negative with normal mesenteric arteries  . DU (duodenal ulcer) 09/19/09    Past Surgical History  Procedure Laterality Date  . Cesarean section    . Appendectomy    . Unilateral oophorectomy with hysterectomy    . Tonsillectomy    . Abdominal hysterectomy    . Esophagogastroduodenoscopy  09/19/09    Rourk -2 duodenal bulbar ulcers, small HH otherwise normal/tiny distal esophageal erosions with mild erosive reflux  . Coronary angioplasty with stent placement  2011    Dr. Velora Heckler, [place]  . Esophagogastroduodenoscopy   03/2006    Rourk-Hiatal hernia, negative small bowel biopsy  . Colonoscopy  01/2006    Rourk-pedunculated polyp 30 cm hamartomatous polyp,  . Colonoscopy  2005    Lineal ulcer or mid right colon, normal TI nonspecific biopsy    Family History  Problem Relation Age of Onset  . Colon cancer Brother 68    died 1 year later  . Heart attack Mother 1  . Diabetes Sister    Social History:  reports that she has been smoking Cigarettes.  She has a 17.5 pack-year smoking history. She does not have any smokeless tobacco history on file. She reports that she does not drink alcohol or use illicit drugs.  Allergies:  Allergies  Allergen Reactions  . Hydrocodone-Acetaminophen Hives, Nausea And Vomiting and Anxiety    Medications Prior to Admission  Medication Sig Dispense Refill  . albuterol (PROVENTIL HFA;VENTOLIN HFA) 108 (90 BASE) MCG/ACT inhaler Inhale 2 puffs into the lungs every 6 (six) hours as needed for shortness of breath.       Marland Kitchen albuterol (PROVENTIL) (2.5 MG/3ML) 0.083% nebulizer solution Take 2.5 mg by nebulization every 6 (six) hours as needed for wheezing or shortness of breath.      . guaiFENesin-codeine (ROBITUSSIN AC) 100-10 MG/5ML syrup Take 5 mLs by mouth every 6 (six) hours as needed for cough.      Marland Kitchen levofloxacin (  LEVAQUIN) 750 MG tablet Take 750 mg by mouth daily. 5 day course starting on 04/06/2014      . ondansetron (ZOFRAN-ODT) 4 MG disintegrating tablet Take 4 mg by mouth every 8 (eight) hours as needed for nausea or vomiting.      Marland Kitchen oxyCODONE-acetaminophen (PERCOCET) 10-325 MG per tablet Take 1 tablet by mouth every 4 (four) hours as needed for pain.      . predniSONE (DELTASONE) 20 MG tablet Take 40 mg by mouth daily with breakfast. 5 day course starting on 04/06/2014        Results for orders placed during the hospital encounter of 04/09/14 (from the past 48 hour(s))  CBC WITH DIFFERENTIAL     Status: Abnormal   Collection Time    04/09/14  4:15 PM      Result Value  Ref Range   WBC 12.6 (*) 4.0 - 10.5 K/uL   RBC 4.16  3.87 - 5.11 MIL/uL   Hemoglobin 12.3  12.0 - 15.0 g/dL   HCT 38.1  36.0 - 46.0 %   MCV 91.6  78.0 - 100.0 fL   MCH 29.6  26.0 - 34.0 pg   MCHC 32.3  30.0 - 36.0 g/dL   RDW 13.6  11.5 - 15.5 %   Platelets 230  150 - 400 K/uL   Neutrophils Relative % 88 (*) 43 - 77 %   Neutro Abs 11.1 (*) 1.7 - 7.7 K/uL   Lymphocytes Relative 4 (*) 12 - 46 %   Lymphs Abs 0.5 (*) 0.7 - 4.0 K/uL   Monocytes Relative 3  3 - 12 %   Monocytes Absolute 0.4  0.1 - 1.0 K/uL   Eosinophils Relative 5  0 - 5 %   Eosinophils Absolute 0.6  0.0 - 0.7 K/uL   Basophils Relative 0  0 - 1 %   Basophils Absolute 0.0  0.0 - 0.1 K/uL  COMPREHENSIVE METABOLIC PANEL     Status: Abnormal   Collection Time    04/09/14  4:15 PM      Result Value Ref Range   Sodium 140  137 - 147 mEq/L   Potassium 4.0  3.7 - 5.3 mEq/L   Chloride 101  96 - 112 mEq/L   CO2 26  19 - 32 mEq/L   Glucose, Bld 160 (*) 70 - 99 mg/dL   BUN 8  6 - 23 mg/dL   Creatinine, Ser 0.67  0.50 - 1.10 mg/dL   Calcium 8.6  8.4 - 10.5 mg/dL   Total Protein 6.5  6.0 - 8.3 g/dL   Albumin 3.3 (*) 3.5 - 5.2 g/dL   AST 17  0 - 37 U/L   ALT 14  0 - 35 U/L   Alkaline Phosphatase 110  39 - 117 U/L   Total Bilirubin <0.2 (*) 0.3 - 1.2 mg/dL   GFR calc non Af Amer >90  >90 mL/min   GFR calc Af Amer >90  >90 mL/min   Comment: (NOTE)     The eGFR has been calculated using the CKD EPI equation.     This calculation has not been validated in all clinical situations.     eGFR's persistently <90 mL/min signify possible Chronic Kidney     Disease.   Anion gap 13  5 - 15  TROPONIN I     Status: None   Collection Time    04/09/14  4:15 PM      Result Value Ref Range   Troponin  I <0.30  <0.30 ng/mL   Comment:            Due to the release kinetics of cTnI,     a negative result within the first hours     of the onset of symptoms does not rule out     myocardial infarction with certainty.     If myocardial  infarction is still suspected,     repeat the test at appropriate intervals.  PRO B NATRIURETIC PEPTIDE     Status: Abnormal   Collection Time    04/09/14  4:15 PM      Result Value Ref Range   Pro B Natriuretic peptide (BNP) 691.4 (*) 0 - 125 pg/mL  TROPONIN I     Status: None   Collection Time    04/09/14  5:41 PM      Result Value Ref Range   Troponin I <0.30  <0.30 ng/mL   Comment:            Due to the release kinetics of cTnI,     a negative result within the first hours     of the onset of symptoms does not rule out     myocardial infarction with certainty.     If myocardial infarction is still suspected,     repeat the test at appropriate intervals.  D-DIMER, QUANTITATIVE     Status: None   Collection Time    04/09/14  5:41 PM      Result Value Ref Range   D-Dimer, Quant 0.38  0.00 - 0.48 ug/mL-FEU   Comment:            AT THE INHOUSE ESTABLISHED CUTOFF     VALUE OF 0.48 ug/mL FEU,     THIS ASSAY HAS BEEN DOCUMENTED     IN THE LITERATURE TO HAVE     A SENSITIVITY AND NEGATIVE     PREDICTIVE VALUE OF AT LEAST     98 TO 99%.  THE TEST RESULT     SHOULD BE CORRELATED WITH     AN ASSESSMENT OF THE CLINICAL     PROBABILITY OF DVT / VTE.   Dg Chest 2 View  04/09/2014   CLINICAL DATA:  Productive cough. Mid sternal chest pain and shortness of breath. Symptoms over the past 5-6 days.  EXAM: CHEST  2 VIEW  COMPARISON:  One view chest x-ray 02/05/2012. Two-view chest x-ray 06/27/2010, 01/24/2010.  FINDINGS: Cardiac silhouette upper normal in size, perhaps slightly increased in size since 2013. Thoracic aorta atherosclerotic, unchanged. Hilar and mediastinal contours otherwise unremarkable. Prominent bronchovascular markings diffusely and moderate to marked central peribronchial thickening. Streaky airspace opacities in the left lower lobe. Lungs otherwise clear. No pneumothorax. No pleural effusions. Mild degenerative changes involving the mid thoracic spine.  IMPRESSION: Left  lower lobe bronchopneumonia superimposed upon moderate to severe changes of acute bronchitis and/or asthma.   Electronically Signed   By: Evangeline Dakin M.D.   On: 04/09/2014 17:01    Review of Systems  Constitutional: Negative.   HENT: Negative.   Eyes: Negative.   Respiratory: Positive for cough, sputum production, shortness of breath and wheezing. Negative for hemoptysis.   Cardiovascular: Positive for chest pain.  Gastrointestinal: Negative.   Genitourinary: Negative.   Musculoskeletal: Negative.   Skin: Negative.   Neurological: Negative.   Endo/Heme/Allergies: Negative.   Psychiatric/Behavioral: Negative.     Blood pressure 106/63, pulse 91, temperature 98.6 F (37 C), temperature source Oral, resp. rate  16, height 5' 4" (1.626 m), weight 60.782 kg (134 lb), SpO2 95.00%. Physical Exam  Constitutional: She is oriented to person, place, and time. She appears well-developed and well-nourished.  HENT:  Head: Normocephalic and atraumatic.  Eyes: Conjunctivae and EOM are normal. Pupils are equal, round, and reactive to light.  Neck: Normal range of motion. Neck supple.  Cardiovascular: Normal rate, regular rhythm and normal heart sounds.  Exam reveals no gallop and no friction rub.   No murmur heard. Respiratory: Effort normal. No respiratory distress. She has wheezes. She has rales. She exhibits tenderness.  GI: Soft. Bowel sounds are normal. She exhibits no distension and no mass. There is no tenderness. There is no rebound and no guarding.  Musculoskeletal: Normal range of motion.  Neurological: She is alert and oriented to person, place, and time. She has normal reflexes.  Skin: Skin is warm and dry.  Psychiatric: She has a normal mood and affect. Her behavior is normal. Judgment and thought content normal.     Assessment/Plan Pneumonia:  vanco iv pharmacy to dose, levaquin iv,  Mild copd exacerbation: solumedrol 59m iv q8h, spiriva, 1puff qday, albuterol 0.083% 1 neb po  qid and q6h prn Chest pain: cycle cardiac markers, check d dimer,  If positive check CTA chest if creatinine wnl Hyperglycemia check hga1c,  Tobacco use:  counselled on smoking cessation x 564mutes CAD:  Cont current tx DVT prophylaxis lovenox   Shelby Anderle 04/09/2014, 7:31 PM

## 2014-04-09 NOTE — ED Provider Notes (Signed)
CSN: [id number]     Arrival date & time 04/09/14  1551 History   First MD Initiated Contact with Patient 04/09/14 1557     Chief Complaint  Patient presents with  . Cough     (Consider location/radiation/quality/duration/timing/severity/associated sxs/prior Treatment) HPI Comments: Patient is a 53 year old female with history of COPD. She presents with complaints of chest congestion and cough for the past week. She had been treated with amoxicillin, however was not improving. She went back to her doctor and her prescription was changed to Levaquin. She presents here with continued burning in her chest, productive cough, and shortness of breath.  Patient is a 53 y.o. female presenting with cough. The history is provided by the patient.  Cough Cough characteristics:  Productive Sputum characteristics:  Yellow Severity:  Moderate Onset quality:  Sudden Duration:  1 week Timing:  Constant Progression:  Worsening Chronicity:  Recurrent Smoker: yes   Relieved by:  Nothing Worsened by:  Nothing tried Ineffective treatments:  Home nebulizer Associated symptoms: no chest pain, no chills and no fever     Past Medical History  Diagnosis Date  . CAD (coronary artery disease)     S/P cath with stent in 2001  . CVA (cerebral vascular accident) 181992  . Pulmonary embolus 1999  . Migraine   . Urinary frequency   . History of kidney stones   . Hiatal hernia     EGD september 2007. normal except for small hiatal hernia. She underwent small bowel biopsy with history of diarrhea at that time. This was negative  . History of colonoscopy     August 2007. She had a pedunculated polyp at 30 cm. which was hamartomatous polyp. She  is due for 5-year followup with family history of coln cancer in a brother at age 55  . History of colonoscopy     2005 revealed a linear ulcer with scar versus inflammatory process at the mid right colon, but normal terminal ileum. A biopsy of this area revealed an  ulceration, but nonspecific  . History of coronary angiogram     CT angiogram in 2007. negative with normal mesenteric arteries  . DU (duodenal ulcer) 09/19/09   Past Surgical History  Procedure Laterality Date  . Cesarean section    . Appendectomy    . Unilateral oophorectomy with hysterectomy    . Tonsillectomy    . Abdominal hysterectomy    . Esophagogastroduodenoscopy  09/19/09    Rourk -2 duodenal bulbar ulcers, small HH otherwise normal/tiny distal esophageal erosions with mild erosive reflux  . Coronary angioplasty with stent placement  2011    Dr. Corinda GublerLeBauer, [place]  . Esophagogastroduodenoscopy  03/2006    Rourk-Hiatal hernia, negative small bowel biopsy  . Colonoscopy  01/2006    Rourk-pedunculated polyp 30 cm hamartomatous polyp,  . Colonoscopy  2005    Lineal ulcer or mid right colon, normal TI nonspecific biopsy   Family History  Problem Relation Age of Onset  . Colon cancer Brother 39    died 1 year later  . Heart attack Mother 368  . Diabetes Sister    History  Substance Use Topics  . Smoking status: Current Every Day Smoker -- 0.50 packs/day for 35 years    Types: Cigarettes  . Smokeless tobacco: Not on file  . Alcohol Use: No   OB History   Grav Para Term Preterm Abortions TAB SAB Ect Mult Living  Review of Systems  Constitutional: Negative for fever and chills.  Respiratory: Positive for cough.   Cardiovascular: Negative for chest pain.  All other systems reviewed and are negative.     Allergies  Hydrocodone-acetaminophen  Home Medications   Prior to Admission medications   Medication Sig Start Date End Date Taking? Authorizing Provider  albuterol (PROVENTIL HFA;VENTOLIN HFA) 108 (90 BASE) MCG/ACT inhaler Inhale 2 puffs into the lungs every 6 (six) hours as needed for shortness of breath.     Historical Provider, MD  omeprazole (PRILOSEC) 40 MG capsule Take 40 mg by mouth 2 (two) times daily.    Historical Provider, MD  Probiotic  Product (PROBIOTIC FORMULA PO) Take 1 capsule by mouth daily.     Historical Provider, MD  promethazine (PHENERGAN) 25 MG tablet Take 1 tablet (25 mg total) by mouth every 6 (six) hours as needed for nausea. 12/20/12   Donnetta HutchingBrian Cook, MD   BP 113/66  Pulse 93  Temp(Src) 99.1 F (37.3 C) (Oral)  Resp 16  SpO2 100% Physical Exam  Nursing note and vitals reviewed. Constitutional: She is oriented to person, place, and time. No distress.  Patient is a 53 year old female who appears older than stated age. She appears uncomfortable  HENT:  Head: Normocephalic and atraumatic.  Mouth/Throat: Oropharynx is clear and moist.  Neck: Normal range of motion. Neck supple.  Cardiovascular: Normal rate, regular rhythm and normal heart sounds.   No murmur heard. Pulmonary/Chest: Effort normal. No respiratory distress. She has wheezes. She has no rales.  There are bilateral expiratory rhonchi present.  Abdominal: Soft. Bowel sounds are normal. She exhibits no distension. There is no tenderness.  Musculoskeletal: Normal range of motion. She exhibits no edema.  Neurological: She is alert and oriented to person, place, and time.  Skin: Skin is warm and dry. She is not diaphoretic.    ED Course  Procedures (including critical care time) Labs Review Labs Reviewed  CBC WITH DIFFERENTIAL  COMPREHENSIVE METABOLIC PANEL  TROPONIN I  PRO B NATRIURETIC PEPTIDE    Imaging Review No results found.   EKG Interpretation   Date/Time:  Saturday April 09 2014 16:25:12 EDT Ventricular Rate:  87 PR Interval:  133 QRS Duration: 95 QT Interval:  371 QTC Calculation: 446 R Axis:   89 Text Interpretation:  Sinus rhythm Normal ECG Confirmed by DELOS  MD,  Eland Lamantia ([id number]) on 04/09/2014 5:32:32 PM      MDM   Final diagnoses:  None    Patient is a 53 year old female who presents with pneumonia and COPD exacerbation. She is complaining of significant chest discomfort which not controlled with morphine. Her  troponin is negative and EKG does not show any acute changes. I suspect this is pulmonary in nature. As she has failed outpatient therapy with 2 separate antibiotics, I feel as though admission for IV antibiotics and further treatment is indicated. I've spoken with Dr. Selena BattenKim who agrees to admit.    Geoffery Lyonsouglas Shauntea Lok, MD 04/09/14 [phone]

## 2014-04-09 NOTE — Progress Notes (Signed)
Text paged [name] [name] due to the patients complaint of chest pain rates an 8 constant.  Will pass on in report.

## 2014-04-10 DIAGNOSIS — Z720 Tobacco use: Secondary | ICD-10-CM

## 2014-04-10 LAB — TROPONIN I

## 2014-04-10 LAB — HEMOGLOBIN A1C
Hgb A1c MFr Bld: 6 % — ABNORMAL HIGH (ref <5.7)
MEAN PLASMA GLUCOSE: 126 mg/dL — AB (ref <117)

## 2014-04-10 MED ORDER — GUAIFENESIN ER 600 MG PO TB12
600.0000 mg | ORAL_TABLET | Freq: Two times a day (BID) | ORAL | Status: DC
  Administered 2014-04-10 – 2014-04-11 (×3): 600 mg via ORAL
  Filled 2014-04-10 (×3): qty 1

## 2014-04-10 MED ORDER — OXYCODONE-ACETAMINOPHEN 10-325 MG PO TABS: 1.0000 | ORAL_TABLET | ORAL | Status: DC | PRN

## 2014-04-10 MED ORDER — METHYLPREDNISOLONE SODIUM SUCC 125 MG IJ SOLR
80.0000 mg | Freq: Every day | INTRAMUSCULAR | Status: DC
  Administered 2014-04-11: 80 mg via INTRAVENOUS
  Filled 2014-04-10: qty 2

## 2014-04-10 MED ORDER — IPRATROPIUM-ALBUTEROL 0.5-2.5 (3) MG/3ML IN SOLN
3.0000 mL | RESPIRATORY_TRACT | Status: DC
  Administered 2014-04-10 (×2): 3 mL via RESPIRATORY_TRACT
  Filled 2014-04-10 (×2): qty 3

## 2014-04-10 MED ORDER — ALBUTEROL SULFATE (2.5 MG/3ML) 0.083% IN NEBU: 2.5000 mg | INHALATION_SOLUTION | Freq: Four times a day (QID) | RESPIRATORY_TRACT | Status: DC | PRN

## 2014-04-10 MED ORDER — DEXTROSE 5 % IV SOLN
1.0000 g | INTRAVENOUS | Status: DC
  Administered 2014-04-10: 1 g via INTRAVENOUS
  Filled 2014-04-10 (×3): qty 10

## 2014-04-10 MED ORDER — METHYLPREDNISOLONE SODIUM SUCC 125 MG IJ SOLR
80.0000 mg | Freq: Three times a day (TID) | INTRAMUSCULAR | Status: DC
  Administered 2014-04-10: 80 mg via INTRAVENOUS
  Filled 2014-04-10: qty 2

## 2014-04-10 MED ORDER — IPRATROPIUM-ALBUTEROL 0.5-2.5 (3) MG/3ML IN SOLN
3.0000 mL | RESPIRATORY_TRACT | Status: DC
  Administered 2014-04-10 – 2014-04-11 (×5): 3 mL via RESPIRATORY_TRACT
  Filled 2014-04-10 (×5): qty 3

## 2014-04-10 MED ORDER — TIOTROPIUM BROMIDE MONOHYDRATE 18 MCG IN CAPS
18.0000 ug | ORAL_CAPSULE | Freq: Every day | RESPIRATORY_TRACT | Status: DC
  Administered 2014-04-10: 18 ug via RESPIRATORY_TRACT
  Filled 2014-04-10: qty 5

## 2014-04-10 MED ORDER — ONDANSETRON 4 MG PO TBDP
4.0000 mg | ORAL_TABLET | Freq: Three times a day (TID) | ORAL | Status: DC | PRN
  Administered 2014-04-10: 4 mg via ORAL
  Filled 2014-04-10: qty 1

## 2014-04-10 MED ORDER — KETOROLAC TROMETHAMINE 30 MG/ML IJ SOLN
30.0000 mg | Freq: Four times a day (QID) | INTRAMUSCULAR | Status: DC | PRN
  Administered 2014-04-10 – 2014-04-11 (×2): 30 mg via INTRAVENOUS
  Filled 2014-04-10 (×2): qty 1

## 2014-04-10 MED ORDER — DEXTROSE 5 % IV SOLN
500.0000 mg | INTRAVENOUS | Status: DC
  Administered 2014-04-10: 500 mg via INTRAVENOUS
  Filled 2014-04-10 (×2): qty 500

## 2014-04-10 MED ORDER — ALBUTEROL SULFATE HFA 108 (90 BASE) MCG/ACT IN AERS: 2.0000 | INHALATION_SPRAY | Freq: Four times a day (QID) | RESPIRATORY_TRACT | Status: DC | PRN

## 2014-04-10 MED ORDER — GUAIFENESIN-CODEINE 100-10 MG/5ML PO SOLN
5.0000 mL | Freq: Four times a day (QID) | ORAL | Status: DC | PRN
  Administered 2014-04-10 – 2014-04-11 (×4): 5 mL via ORAL
  Filled 2014-04-10 (×4): qty 5

## 2014-04-10 MED ORDER — OXYCODONE-ACETAMINOPHEN 5-325 MG PO TABS
1.0000 | ORAL_TABLET | ORAL | Status: DC | PRN
  Administered 2014-04-10 – 2014-04-11 (×4): 1 via ORAL
  Filled 2014-04-10 (×4): qty 1

## 2014-04-10 MED ORDER — OXYCODONE HCL 5 MG PO TABS
5.0000 mg | ORAL_TABLET | ORAL | Status: DC | PRN
  Administered 2014-04-10 – 2014-04-11 (×4): 5 mg via ORAL
  Filled 2014-04-10 (×4): qty 1

## 2014-04-10 MED ORDER — ZOLPIDEM TARTRATE 5 MG PO TABS
5.0000 mg | ORAL_TABLET | Freq: Every evening | ORAL | Status: DC | PRN
  Administered 2014-04-10: 5 mg via ORAL
  Filled 2014-04-10: qty 1

## 2014-04-10 NOTE — Progress Notes (Signed)
[facility] PROGRESS NOTE  Nash DimmerCarletta F Kessinger ZOX:[id number] DOB: 1960-08-02 DOA: 04/09/2014 PCP: Selinda FlavinHOWARD, KEVIN, MD  Assessment/Plan: 1. Community acquired pneumonia. Patient was prescribed a course of levaquin as an outpatient, but failed to improve. Will give the patient iv rocephin and azithromycin. Continue pulmonary hygiene 2. Copd exacerbation. Continue steorids, abx and bronchodilators 3. Tobacco abuse. Counseled on the importance of tobacco cessation 4. Pleuritic chest pain related to #1, continue supportive treatment, cardiac enzymes negative, d dimer negative, can discontinue telemetry    Code Status: full code Family Communication: discussed with patient and family at the bedside Disposition Plan: discharge home once improved   Consultants:    Procedures:  Antibiotics:  Rocephin 10/11  azithro 10/11  HPI/Subjective: Patient continues to complain of cough and pleuritic chest pain with cough  Objective: Filed Vitals:   04/10/14 0611  BP: 123/82  Pulse: 85  Temp: 98.1 F (36.7 C)  Resp: 20    Intake/Output Summary (Last 24 hours) at 04/10/14 1236 Last data filed at 04/10/14 0800  Gross per 24 hour  Intake    490 ml  Output      0 ml  Net    490 ml   Filed Weights   04/09/14 1835 04/10/14 0500  Weight: 60.782 kg (134 lb) 61.236 kg (135 lb)    Exam:   General:  NAD  Cardiovascular: s1, s2, rrr  Respiratory: mild bilateral expiratory wheezing and crackles  Abdomen: soft, nt, nd, bs+  Musculoskeletal: no edema b/l   Data Reviewed: Basic Metabolic Panel:  Recent Labs Lab 04/09/14 1615  NA 140  K 4.0  CL 101  CO2 26  GLUCOSE 160*  BUN 8  CREATININE 0.67  CALCIUM 8.6   Liver Function Tests:  Recent Labs Lab 04/09/14 1615  AST 17  ALT 14  ALKPHOS 110  BILITOT <0.2*  PROT 6.5  ALBUMIN 3.3*   No results found for this basename: LIPASE, AMYLASE,  in the last 168 hours No results found for this basename: AMMONIA,  in the  last 168 hours CBC:  Recent Labs Lab 04/09/14 1615  WBC 12.6*  NEUTROABS 11.1*  HGB 12.3  HCT 38.1  MCV 91.6  PLT 230   Cardiac Enzymes:  Recent Labs Lab 04/09/14 1615 04/09/14 1741 04/09/14 2336 04/10/14 0601  TROPONINI <0.30 <0.30 <0.30 <0.30   BNP (last 3 results)  Recent Labs  04/09/14 1615  PROBNP 691.4*   CBG: No results found for this basename: GLUCAP,  in the last 168 hours  No results found for this or any previous visit (from the past 240 hour(s)).   Studies: Dg Chest 2 View  04/09/2014   CLINICAL DATA:  Productive cough. Mid sternal chest pain and shortness of breath. Symptoms over the past 5-6 days.  EXAM: CHEST  2 VIEW  COMPARISON:  One view chest x-ray 02/05/2012. Two-view chest x-ray 06/27/2010, 01/24/2010.  FINDINGS: Cardiac silhouette upper normal in size, perhaps slightly increased in size since 2013. Thoracic aorta atherosclerotic, unchanged. Hilar and mediastinal contours otherwise unremarkable. Prominent bronchovascular markings diffusely and moderate to marked central peribronchial thickening. Streaky airspace opacities in the left lower lobe. Lungs otherwise clear. No pneumothorax. No pleural effusions. Mild degenerative changes involving the mid thoracic spine.  IMPRESSION: Left lower lobe bronchopneumonia superimposed upon moderate to severe changes of acute bronchitis and/or asthma.   Electronically Signed   By: Hulan Saashomas  Lawrence M.D.   On: 04/09/2014 17:01    Scheduled Meds: . azithromycin  500 mg Intravenous  Q24H  . cefTRIAXone (ROCEPHIN)  IV  1 g Intravenous Q24H  . enoxaparin (LOVENOX) injection  40 mg Subcutaneous Q24H  . ipratropium-albuterol  3 mL Nebulization Q4H  . [START ON 04/11/2014] methylPREDNISolone (SOLU-MEDROL) injection  80 mg Intravenous Daily  . sodium chloride  3 mL Intravenous Q12H  . sodium chloride  3 mL Intravenous Q12H   Continuous Infusions:   Principal Problem:   Community acquired pneumonia Active Problems:    Pneumonia    Time spent: 25mins    [facility] Francis HospitalMEMON,Mariavictoria Nottingham  [facility] Pager [phone]. If 7PM-7AM, please contact night-coverage at [email or web address], password [email or web address] 04/10/2014, 12:36 PM  LOS: 1 day

## 2014-04-10 NOTE — Progress Notes (Signed)
Utilization review Completed Journei Thomassen RN BSN   

## 2014-04-11 LAB — BASIC METABOLIC PANEL
Anion gap: 8 (ref 5–15)
BUN: 16 mg/dL (ref 6–23)
CHLORIDE: 102 meq/L (ref 96–112)
CO2: 30 mEq/L (ref 19–32)
Calcium: 8.5 mg/dL (ref 8.4–10.5)
Creatinine, Ser: 0.69 mg/dL (ref 0.50–1.10)
GFR calc non Af Amer: >90 mL/min (ref >90)
Glucose, Bld: 115 mg/dL — ABNORMAL HIGH (ref 70–99)
Potassium: 3.9 mEq/L (ref 3.7–5.3)
Sodium: 140 mEq/L (ref 137–147)

## 2014-04-11 LAB — CBC
HCT: 35.3 % — ABNORMAL LOW (ref 36.0–46.0)
HEMOGLOBIN: 11.7 g/dL — AB (ref 12.0–15.0)
MCH: 29.9 pg (ref 26.0–34.0)
MCHC: 33.1 g/dL (ref 30.0–36.0)
MCV: 90.3 fL (ref 78.0–100.0)
PLATELETS: 241 10*3/uL (ref 150–400)
RBC: 3.91 MIL/uL (ref 3.87–5.11)
RDW: 14.1 % (ref 11.5–15.5)
WBC: 12.8 10*3/uL — AB (ref 4.0–10.5)

## 2014-04-11 MED ORDER — GUAIFENESIN ER 600 MG PO TB12: 600.0000 mg | ORAL_TABLET | Freq: Two times a day (BID) | ORAL | Status: DC

## 2014-04-11 MED ORDER — AMOXICILLIN-POT CLAVULANATE 875-125 MG PO TABS: 1.0000 | ORAL_TABLET | Freq: Two times a day (BID) | ORAL | Status: DC

## 2014-04-11 MED ORDER — PREDNISONE 10 MG PO TABS
ORAL_TABLET | ORAL | Status: DC
Start: 2014-04-11 — End: 2017-10-30

## 2014-04-11 MED ORDER — AZITHROMYCIN 250 MG PO TABS
500.0000 mg | ORAL_TABLET | Freq: Every day | ORAL | Status: DC
  Administered 2014-04-11: 500 mg via ORAL
  Filled 2014-04-11: qty 2

## 2014-04-11 NOTE — Discharge Instructions (Signed)

## 2014-04-11 NOTE — Discharge Summary (Signed)
Physician Discharge Summary  Nash DimmerCarletta F Satterly EAV:[id number] DOB: 27-Nov-1960 DOA: 04/09/2014  PCP: Selinda FlavinHOWARD, KEVIN, MD  Admit date: 04/09/2014 Discharge date: 04/11/2014  Time spent: 40 minutes  Recommendations for Outpatient Follow-up:  1. Followup with primary care physician in one to 2 weeks  Discharge Diagnoses:  Principal Problem:   Community acquired pneumonia Active Problems:   Pneumonia COPD exacerbation Tobacco abuse Pleuritic chest pain  Discharge Condition: improved  Diet recommendation: low salt  Filed Weights   04/09/14 1835 04/10/14 0500 04/11/14 0533  Weight: 60.782 kg (134 lb) 61.236 kg (135 lb) 64.864 kg (143 lb)    History of present illness and hospital course:  This patient was admitted to the hospital with progressive cough. She was recently treated as an outpatient for pneumonia with Levaquin and steroids. Unfortunately her symptoms continued to persist and she came to the emergency room. She was admitted and started on intravenous antibiotics, intravenous steroids and bronchodilators. She was also found to have COPD exacerbation with a pneumonia. The patient continues to smoke. With antibiotic therapy, steroids and bronchodilators her shortness of breath has significantly improved. She is no longer wheezing. She is still coughing. It was noted by staff that she was found many times in the bathroom trying to smoke during her hospital stay. She is ambulating without difficulty. She is ready for discharge home. She'll be placed on a course of oral antibiotics as well as prednisone taper.   Procedures:    Consultations:    Discharge Exam: Filed Vitals:   04/11/14 1317  BP: 106/58  Pulse: 90  Temp: 98.2 F (36.8 C)  Resp: 20    General: NAD Cardiovascular: S1, S2 RRR Respiratory: CTA B  Discharge Instructions You were cared for by a hospitalist during your hospital stay. If you have any questions about your discharge medications or the care you  received while you were in the hospital after you are discharged, you can call the unit and asked to speak with the hospitalist on call if the hospitalist that took care of you is not available. Once you are discharged, your primary care physician will handle any further medical issues. Please note that NO REFILLS for any discharge medications will be authorized once you are discharged, as it is imperative that you return to your primary care physician (or establish a relationship with a primary care physician if you do not have one) for your aftercare needs so that they can reassess your need for medications and monitor your lab values.  Discharge Instructions   Call MD for:  difficulty breathing, headache or visual disturbances    Complete by:  As directed      Call MD for:  temperature >100.4    Complete by:  As directed      Diet - low sodium heart healthy    Complete by:  As directed      Increase activity slowly    Complete by:  As directed           Discharge Medication List as of 04/11/2014  4:21 PM    START taking these medications   Details  amoxicillin-clavulanate (AUGMENTIN) 875-125 MG per tablet Take 1 tablet by mouth 2 (two) times daily., Starting 04/11/2014, Until Discontinued, Print    guaiFENesin (MUCINEX) 600 MG 12 hr tablet Take 1 tablet (600 mg total) by mouth 2 (two) times daily., Starting 04/11/2014, Until Discontinued, Print      CONTINUE these medications which have CHANGED   Details  predniSONE (  DELTASONE) 10 MG tablet Take 40mg  po daily for 2 days then 30mg  po daily for 2 days then 20mg  po daily for 2 days then 10mg  po daily for 2 days then stop, Print      CONTINUE these medications which have NOT CHANGED   Details  albuterol (PROVENTIL HFA;VENTOLIN HFA) 108 (90 BASE) MCG/ACT inhaler Inhale 2 puffs into the lungs every 6 (six) hours as needed for shortness of breath. , Until Discontinued, Historical Med    albuterol (PROVENTIL) (2.5 MG/3ML) 0.083% nebulizer  solution Take 2.5 mg by nebulization every 6 (six) hours as needed for wheezing or shortness of breath., Until Discontinued, Historical Med    guaiFENesin-codeine (ROBITUSSIN AC) 100-10 MG/5ML syrup Take 5 mLs by mouth every 6 (six) hours as needed for cough., Until Discontinued, Historical Med    ondansetron (ZOFRAN-ODT) 4 MG disintegrating tablet Take 4 mg by mouth every 8 (eight) hours as needed for nausea or vomiting., Until Discontinued, Historical Med    oxyCODONE-acetaminophen (PERCOCET) 10-325 MG per tablet Take 1 tablet by mouth every 4 (four) hours as needed for pain., Until Discontinued, Historical Med      STOP taking these medications     levofloxacin (LEVAQUIN) 750 MG tablet        Allergies  Allergen Reactions  . Hydrocodone-Acetaminophen Hives, Nausea And Vomiting and Anxiety   Follow-up Information   Follow up with Selinda Flavin, MD. Schedule an appointment as soon as possible for a visit in 2 weeks.   Specialty:  Family Medicine   Contact information:   [address] [place] [address] [phone]        The results of significant diagnostics from this hospitalization (including imaging, microbiology, ancillary and laboratory) are listed below for reference.    Significant Diagnostic Studies: Dg Chest 2 View  04/09/2014   CLINICAL DATA:  Productive cough. Mid sternal chest pain and shortness of breath. Symptoms over the past 5-6 days.  EXAM: CHEST  2 VIEW  COMPARISON:  One view chest x-ray 02/05/2012. Two-view chest x-ray 06/27/2010, 01/24/2010.  FINDINGS: Cardiac silhouette upper normal in size, perhaps slightly increased in size since 2013. Thoracic aorta atherosclerotic, unchanged. Hilar and mediastinal contours otherwise unremarkable. Prominent bronchovascular markings diffusely and moderate to marked central peribronchial thickening. Streaky airspace opacities in the left lower lobe. Lungs otherwise clear. No pneumothorax. No pleural effusions. Mild  degenerative changes involving the mid thoracic spine.  IMPRESSION: Left lower lobe bronchopneumonia superimposed upon moderate to severe changes of acute bronchitis and/or asthma.   Electronically Signed   By: Hulan Saas M.D.   On: 04/09/2014 17:01    Microbiology: No results found for this or any previous visit (from the past 240 hour(s)).   Labs: Basic Metabolic Panel:  Recent Labs Lab 04/09/14 1615 04/11/14 0632  NA 140 140  K 4.0 3.9  CL 101 102  CO2 26 30  GLUCOSE 160* 115*  BUN 8 16  CREATININE 0.67 0.69  CALCIUM 8.6 8.5   Liver Function Tests:  Recent Labs Lab 04/09/14 1615  AST 17  ALT 14  ALKPHOS 110  BILITOT <0.2*  PROT 6.5  ALBUMIN 3.3*   No results found for this basename: LIPASE, AMYLASE,  in the last 168 hours No results found for this basename: AMMONIA,  in the last 168 hours CBC:  Recent Labs Lab 04/09/14 1615 04/11/14 0632  WBC 12.6* 12.8*  NEUTROABS 11.1*  --   HGB 12.3 11.7*  HCT 38.1 35.3*  MCV 91.6  90.3  PLT 230 241   Cardiac Enzymes:  Recent Labs Lab 04/09/14 1615 04/09/14 1741 04/09/14 2336 04/10/14 0601  TROPONINI <0.30 <0.30 <0.30 <0.30   BNP: BNP (last 3 results)  Recent Labs  04/09/14 1615  PROBNP 691.4*   CBG: No results found for this basename: GLUCAP,  in the last 168 hours     Signed:  Gregary Blackard  [facility] 04/11/2014, 8:29 PM

## 2014-04-11 NOTE — Progress Notes (Signed)
PHARMACIST - PHYSICIAN COMMUNICATION DR:   Kerry HoughMemon CONCERNING: Antibiotic IV to Oral Route Change Policy  RECOMMENDATION: This patient is receiving Zithromax by the intravenous route.  Based on criteria approved by the Pharmacy and Therapeutics Committee, the antibiotic(s) is/are being converted to the equivalent oral dose form(s).   DESCRIPTION: These criteria include:  Patient being treated for a respiratory tract infection, urinary tract infection, cellulitis or clostridium difficile associated diarrhea if on metronidazole  The patient is not neutropenic and does not exhibit a GI malabsorption state  The patient is eating (either orally or via tube) and/or has been taking other orally administered medications for a least 24 hours  The patient is improving clinically and has a Tmax < 100.5  If you have questions about this conversion, please contact the Pharmacy Department  [x]   [phone]( [phone] )  [facility] []   [phone]( [phone] )  [facility]  []   [phone]( [phone] )  [facility] []   [phone]( [phone] )  [facility]   Junita PushMichelle Ajane Novella, PharmD, BCPS 04/11/2014@9 :45 AM

## 2014-04-11 NOTE — Care Management Note (Unsigned)
    Page 1 of 1   04/11/2014     3:07:12 PM CARE MANAGEMENT NOTE 04/11/2014  Patient:  Nash DimmerBOYD,Laycie F   Account Number:  [id number]  Date Initiated:  04/11/2014  Documentation initiated by:  Anibal HendersonBOLDEN,Shenise Wolgamott  Subjective/Objective Assessment:   Admitted with PNA. Pt is from home, lives with family,  is independent and will be returning home at D/C.     Action/Plan:   May need assistance with meds at D/C- will follow   Anticipated DC Date:  04/11/2014   Anticipated DC Plan:  HOME/SELF CARE      DC Planning Services  CM consult      Choice offered to / List presented to:             Status of service:  In process, will continue to follow Medicare Important Message given?   (If response is "NO", the following Medicare IM given date fields will be blank) Date Medicare IM given:   Medicare IM given by:   Date Additional Medicare IM given:   Additional Medicare IM given by:    Discharge Disposition:    Per UR Regulation:  Reviewed for med. necessity/level of care/duration of stay  If discussed at Long Length of Stay Meetings, dates discussed:    Comments:  04/11/14 1500 Anibal HendersonGeneva Michella Detjen RN/CM

## 2014-04-11 NOTE — Plan of Care (Signed)
Problem: Phase II Progression Outcomes Goal: Encourage coughing & deep breathing Outcome: Completed/Met Date Met:  04/11/14 Patient using flutter valve

## 2016-06-04 DIAGNOSIS — J440 Chronic obstructive pulmonary disease with acute lower respiratory infection: Secondary | ICD-10-CM | POA: Insufficient documentation

## 2016-08-25 DIAGNOSIS — M5430 Sciatica, unspecified side: Secondary | ICD-10-CM | POA: Insufficient documentation

## 2017-10-29 ENCOUNTER — Encounter: Payer: Self-pay | Admitting: *Deleted

## 2017-10-30 ENCOUNTER — Other Ambulatory Visit: Payer: Self-pay

## 2017-10-30 ENCOUNTER — Encounter: Payer: Self-pay | Admitting: Cardiology

## 2017-10-30 ENCOUNTER — Ambulatory Visit (INDEPENDENT_AMBULATORY_CARE_PROVIDER_SITE_OTHER): Payer: Self-pay | Admitting: Cardiology

## 2017-10-30 VITALS — BP 116/78 | HR 86 | Ht 64.0 in | Wt 122.0 lb

## 2017-10-30 DIAGNOSIS — Z8673 Personal history of transient ischemic attack (TIA), and cerebral infarction without residual deficits: Secondary | ICD-10-CM

## 2017-10-30 DIAGNOSIS — I25118 Atherosclerotic heart disease of native coronary artery with other forms of angina pectoris: Secondary | ICD-10-CM

## 2017-10-30 MED ORDER — ATORVASTATIN CALCIUM 40 MG PO TABS: 40.0000 mg | ORAL_TABLET | Freq: Every day | ORAL | 1 refills | Status: DC

## 2017-10-30 NOTE — Patient Instructions (Signed)
Your physician recommends that you schedule a follow-up appointment in: 6 WEEKS WITH DR [facility]  Your physician has recommended you make the following change in your medication:   START ASPIRIN 81 MG DAILY  START ATORVASTATIN 40 MG DAILY  Thank you for choosing [facility]!!

## 2017-10-30 NOTE — Progress Notes (Signed)
Clinical Summary [name] [name] is a 57 y.o.female seen as new consutt, referred by Dr Dimas Aguas for CAD.   1. CAD - from notes history of prior stent in 2001. Last cah 2010 with patient coronaries and stent.  - recent chest pain. Pulling like feeling/burning mid to right chest, 7-8/10. Can occur at rest or with exertion. Can get very diaphoretic. Not positional. Pain lasts a few minutes, 4 episodes over the last month - highest level of activity is housework, notes low energy keeps her from her regular activities - not on aspirin. STopped several meds due to lack of insurance.   2. History of CVA - no recent symptoms.   3. COPD - followed by pcp      Past Medical History:  Diagnosis Date  . CAD (coronary artery disease)    S/P cath with stent in 2001  . CVA (cerebral vascular accident) 50  . DU (duodenal ulcer) 09/19/09  . Hiatal hernia    EGD september 2007. normal except for small hiatal hernia. She underwent small bowel biopsy with history of diarrhea at that time. This was negative  . History of colonoscopy    August 2007. She had a pedunculated polyp at 30 cm. which was hamartomatous polyp. She  is due for 5-year followup with family history of coln cancer in a brother at age 82  . History of colonoscopy    2005 revealed a linear ulcer with scar versus inflammatory process at the mid right colon, but normal terminal ileum. A biopsy of this area revealed an ulceration, but nonspecific  . History of coronary angiogram    CT angiogram in 2007. negative with normal mesenteric arteries  . History of kidney stones   . Migraine   . Pulmonary embolus 1999  . Urinary frequency      Allergies  Allergen Reactions  . Hydrocodone-Acetaminophen Hives, Nausea And Vomiting and Anxiety     Current Outpatient Medications  Medication Sig Dispense Refill  . albuterol (PROVENTIL HFA;VENTOLIN HFA) 108 (90 BASE) MCG/ACT inhaler Inhale 2 puffs into the lungs every 6 (six) hours as  needed for shortness of breath.     Marland Kitchen albuterol (PROVENTIL) (2.5 MG/3ML) 0.083% nebulizer solution Take 2.5 mg by nebulization every 6 (six) hours as needed for wheezing or shortness of breath.    Marland Kitchen amoxicillin-clavulanate (AUGMENTIN) 875-125 MG per tablet Take 1 tablet by mouth 2 (two) times daily. 8 tablet 0  . guaiFENesin (MUCINEX) 600 MG 12 hr tablet Take 1 tablet (600 mg total) by mouth 2 (two) times daily. 12 tablet 0  . guaiFENesin-codeine (ROBITUSSIN AC) 100-10 MG/5ML syrup Take 5 mLs by mouth every 6 (six) hours as needed for cough.    . ondansetron (ZOFRAN-ODT) 4 MG disintegrating tablet Take 4 mg by mouth every 8 (eight) hours as needed for nausea or vomiting.    Marland Kitchen oxyCODONE-acetaminophen (PERCOCET) 10-325 MG per tablet Take 1 tablet by mouth every 4 (four) hours as needed for pain.    . predniSONE (DELTASONE) 10 MG tablet Take  po daily for 2 days then  po daily for 2 days then  po daily for 2 days then  po daily for 2 days then stop 20 tablet 0   No current facility-administered medications for this visit.      Past Surgical History:  Procedure Laterality Date  . ABDOMINAL HYSTERECTOMY    . APPENDECTOMY    . CESAREAN SECTION    . COLONOSCOPY  01/2006  Rourk-pedunculated polyp 30 cm hamartomatous polyp,  . COLONOSCOPY  2005   Lineal ulcer or mid right colon, normal TI nonspecific biopsy  . CORONARY ANGIOPLASTY WITH STENT PLACEMENT  2011   Dr. Corinda Gubler, [place]  . ESOPHAGOGASTRODUODENOSCOPY  09/19/09   Rourk -2 duodenal bulbar ulcers, small HH otherwise normal/tiny distal esophageal erosions with mild erosive reflux  . ESOPHAGOGASTRODUODENOSCOPY  03/2006   Rourk-Hiatal hernia, negative small bowel biopsy  . TONSILLECTOMY    . Unilateral oophorectomy with hysterectomy       Allergies  Allergen Reactions  . Hydrocodone-Acetaminophen Hives, Nausea And Vomiting and Anxiety      Family History  Problem Relation Age of Onset  . Colon cancer Brother 39        died 1 year later  . Heart attack Mother 67  . Diabetes Sister      Social History [name] reports that she has been smoking cigarettes.  She has a 17.50 pack-year smoking history. She does not have any smokeless tobacco history on file. [name] reports that she does not drink alcohol.   Review of Systems CONSTITUTIONAL: No weight loss, fever, chills, weakness or fatigue.  HEENT: Eyes: No visual loss, blurred vision, double vision or yellow sclerae.No hearing loss, sneezing, congestion, runny nose or sore throat.  SKIN: No rash or itching.  CARDIOVASCULAR: per hpi RESPIRATORY: per hpi GASTROINTESTINAL: No anorexia, nausea, vomiting or diarrhea. No abdominal pain or blood.  GENITOURINARY: No burning on urination, no polyuria NEUROLOGICAL: No headache, dizziness, syncope, paralysis, ataxia, numbness or tingling in the extremities. No change in bowel or bladder control.  MUSCULOSKELETAL: No muscle, back pain, joint pain or stiffness.  LYMPHATICS: No enlarged nodes. No history of splenectomy.  PSYCHIATRIC: No history of depression or anxiety.  ENDOCRINOLOGIC: No reports of sweating, cold or heat intolerance. No polyuria or polydipsia.  Marland Kitchen   Physical Examination Vitals:   10/30/17 0844 10/30/17 0847  BP: 125/84 116/78  Pulse: 89 86  SpO2: 99% 99%   Vitals:   10/30/17 0844  Weight: 122 lb (55.3 kg)  Height:  (1.626 m)    Gen: resting comfortably, no acute distress HEENT: no scleral icterus, pupils equal round and reactive, no palptable cervical adenopathy,  CV: RRR, no m/r/g, no jvd Resp: Clear to auscultation bilaterally GI: abdomen is soft, non-tender, non-distended, normal bowel sounds, no hepatosplenomegaly MSK: extremities are warm, no edema.  Skin: warm, no rash Neuro:  no focal deficits Psych: appropriate affect   Diagnostic Studies  05/2000 cath RESULTS:  Hemodynamics:  Left ventricular pressure 126/20, aortic pressure 118/74. There is no aortic valve  gradient.  Left ventriculogram:  There is moderate hypokinesis of the inferior wall and moderate hypokinesis of the posterolateral wall.  Ejection fraction calculated at 59%.  There is 2+ mild mitral regurgitation.  Coronary arteriography codominant): 1. The left main is normal. 2. The left anterior descending artery has a 30% stenosis in the mid vessel.    The LAD gives rise to a small first diagonal, normal second diagonal, and a    small third diagonal. 3. The left circumflex is a relatively large codominant vessel.  It gives    rise to a large first marginal, a large second marginal, and a small    posterolateral [name] [name].  There is a stent in the mid to distal circumflex    which extends into the second marginal [name] [name].  There is a diffuse 20%    stenosis within the stent. 4. The right coronary  artery is a relatively small codominant vessel.  There    is a 20% stenosis in the mid vessel.  The distal right coronary artery    gives rise to a normal-sized acute marginal which supplies a portion of    the inferior septum and a small posterior descending artery.  IMPRESSIONS: 1. Left ventricular systolic function in lower range of normal with wall    motion abnormalities as described and mild mitral regurgitation. 2. Mild nonobstructive coronary artery disease with a patent stent in the    left circumflex.  08/2008 cath ANGIOGRAPHIC FINDINGS:  1. Left main coronary artery bifurcated into the circumflex and LAD      and had no significant disease noted.  2. The left anterior descending is a large vessel that courses to the      apex and gives rise to a first small diagonal [name] [name], a normal      second diagonal [name] [name], and a small third diagonal [name] [name].  There      was a 20% stenosis noted in the midportion of the LAD.  3. The left circumflex is a relatively large codominant vessel that      gives rise to a large first marginal [name] [name], a large second      marginal [name] [name], and a  small posterolateral [name] [name].  There is a      stent in the mid to distal circumflex which extends into the second      marginal [name] [name].  There is no significant in-stent stenosis noted.      There is a 30% stenosis noted prior to the stented segment in the      midportion of the circumflex.  4. The right coronary artery is a small to moderate size codominant      vessel that has serial 30% lesions in the midportion and gives rise      to a small posterior descending artery.  5. Left ventricular angiogram was performed in the RAO projection and      showed normal left ventricular systolic function with no wall      motion abnormalities.  Ejection fraction was estimated at 55%.   HEMODYNAMIC DATA:  Central aortic pressure 84/50.  Left ventricular  pressure 87/5.  End-diastolic pressure 11.   IMPRESSION:  1. Noncardiac etiology of chest pain.  2. Stable single-vessel coronary artery disease with patent stented      segment in the mid to distal circumflex coronary artery.  3. Mild nonobstructive disease in the right coronary artery and the      left anterior descending coronary artery.  4. Normal left ventricular systolic function.  Assessment and Plan  1. CAD - recent chest pain symptoms - we will obtain lexiscan, I have asked patient to first contact [facility] assistance to see if can qualify for assistance and let us know.  - start asa, atorvastatin  daily in setting of known CAD.  - given ifnor on [facility] assistance, currently self pay.  - EKG in clniic today SR, no ischemic changes.    2. History of CVA - start aspirin and statin for secondary prevention, consider ACE-I low dose at f/u    Antoine Poche, M.D

## 2017-11-06 ENCOUNTER — Encounter: Payer: Self-pay | Admitting: Cardiology

## 2017-11-18 ENCOUNTER — Encounter: Payer: Self-pay | Admitting: *Deleted

## 2017-11-18 ENCOUNTER — Telehealth: Payer: Self-pay | Admitting: *Deleted

## 2017-11-18 NOTE — Telephone Encounter (Signed)
-----   Message from Antoine Poche, MD sent at 11/06/2017  9:58 AM EDT ----- Can we check if patient was able to contact [facility] and what the status is. I wanted to order a lexiscan for her pending this   Dominga Ferry MD

## 2017-11-18 NOTE — Telephone Encounter (Signed)
Have left multiple messages for pt to return call will mail letter.

## 2017-12-12 ENCOUNTER — Ambulatory Visit: Payer: Self-pay | Admitting: Cardiology

## 2017-12-17 ENCOUNTER — Encounter: Payer: Self-pay | Admitting: Cardiology

## 2018-10-29 ENCOUNTER — Other Ambulatory Visit: Payer: Self-pay

## 2018-10-29 ENCOUNTER — Emergency Department (HOSPITAL_COMMUNITY)
Admission: EM | Admit: 2018-10-29 | Discharge: 2018-10-29 | Disposition: A | Discharge status: Home / Self Care | Payer: Self-pay | Attending: Emergency Medicine | Admitting: Emergency Medicine

## 2018-10-29 ENCOUNTER — Emergency Department (HOSPITAL_COMMUNITY): Payer: Self-pay

## 2018-10-29 ENCOUNTER — Encounter (HOSPITAL_COMMUNITY): Payer: Self-pay | Admitting: *Deleted

## 2018-10-29 DIAGNOSIS — J449 Chronic obstructive pulmonary disease, unspecified: Secondary | ICD-10-CM | POA: Insufficient documentation

## 2018-10-29 DIAGNOSIS — Z79899 Other long term (current) drug therapy: Secondary | ICD-10-CM | POA: Insufficient documentation

## 2018-10-29 DIAGNOSIS — H9201 Otalgia, right ear: Secondary | ICD-10-CM | POA: Insufficient documentation

## 2018-10-29 DIAGNOSIS — Z7982 Long term (current) use of aspirin: Secondary | ICD-10-CM | POA: Insufficient documentation

## 2018-10-29 DIAGNOSIS — I2510 Atherosclerotic heart disease of native coronary artery without angina pectoris: Secondary | ICD-10-CM | POA: Insufficient documentation

## 2018-10-29 DIAGNOSIS — F17210 Nicotine dependence, cigarettes, uncomplicated: Secondary | ICD-10-CM | POA: Insufficient documentation

## 2018-10-29 HISTORY — DX: Ulcerative colitis, unspecified, without complications: K51.90

## 2018-10-29 HISTORY — DX: Gastro-esophageal reflux disease without esophagitis: K21.9

## 2018-10-29 HISTORY — DX: Chronic obstructive pulmonary disease, unspecified: J44.9

## 2018-10-29 LAB — CBC WITH DIFFERENTIAL/PLATELET
Abs Immature Granulocytes: 0.01 10*3/uL (ref 0.00–0.07)
Basophils Absolute: 0.1 10*3/uL (ref 0.0–0.1)
Basophils Relative: 1 %
Eosinophils Absolute: 1.4 10*3/uL — ABNORMAL HIGH (ref 0.0–0.5)
Eosinophils Relative: 17 %
HCT: 39.1 % (ref 36.0–46.0)
Hemoglobin: 13.1 g/dL (ref 12.0–15.0)
Immature Granulocytes: 0 %
Lymphocytes Relative: 24 %
Lymphs Abs: 1.9 10*3/uL (ref 0.7–4.0)
MCH: 30.2 pg (ref 26.0–34.0)
MCHC: 33.5 g/dL (ref 30.0–36.0)
MCV: 90.1 fL (ref 80.0–100.0)
Monocytes Absolute: 0.8 10*3/uL (ref 0.1–1.0)
Monocytes Relative: 9 %
Neutro Abs: 3.8 10*3/uL (ref 1.7–7.7)
Neutrophils Relative %: 49 %
Platelets: 300 10*3/uL (ref 150–400)
RBC: 4.34 MIL/uL (ref 3.87–5.11)
RDW: 13.1 % (ref 11.5–15.5)
WBC: 8 10*3/uL (ref 4.0–10.5)
nRBC: 0 % (ref 0.0–0.2)

## 2018-10-29 LAB — BASIC METABOLIC PANEL
Anion gap: 9 (ref 5–15)
BUN: 12 mg/dL (ref 6–20)
CO2: 27 mmol/L (ref 22–32)
Calcium: 9 mg/dL (ref 8.9–10.3)
Chloride: 99 mmol/L (ref 98–111)
Creatinine, Ser: 0.7 mg/dL (ref 0.44–1.00)
GFR calc Af Amer: >60 mL/min (ref >60)
GFR calc non Af Amer: >60 mL/min (ref >60)
Glucose, Bld: 105 mg/dL — ABNORMAL HIGH (ref 70–99)
Potassium: 3.5 mmol/L (ref 3.5–5.1)
Sodium: 135 mmol/L (ref 135–145)

## 2018-10-29 MED ORDER — OXYCODONE-ACETAMINOPHEN 5-325 MG PO TABS: 1.0000 | ORAL_TABLET | Freq: Three times a day (TID) | ORAL | 0 refills | Status: DC | PRN

## 2018-10-29 MED ORDER — IOHEXOL 300 MG/ML  SOLN
75.0000 mL | Freq: Once | INTRAMUSCULAR | Status: AC | PRN
  Administered 2018-10-29: 75 mL via INTRAVENOUS

## 2018-10-29 MED ORDER — FENTANYL CITRATE (PF) 100 MCG/2ML IJ SOLN
50.0000 ug | Freq: Once | INTRAMUSCULAR | Status: AC
  Administered 2018-10-29: 50 ug via INTRAVENOUS
  Filled 2018-10-29: qty 2

## 2018-10-29 NOTE — ED Triage Notes (Signed)
Pt c/o pain and swelling to right ear that worsened over the last 2 days. Pt reports her PCP saw her 1 week ago today and she was diagnosed with an abscess in her right ear and placed on 2 antibiotics. Pt has decreased hearing in right ear.

## 2018-10-29 NOTE — Discharge Instructions (Signed)
Continue with the antibiotics as directed. Please follow-up with ENT as discussed.

## 2018-10-29 NOTE — ED Provider Notes (Signed)
[facility] EMERGENCY DEPARTMENT Provider Note   CSN: [id number] Arrival date & time: 10/29/18  1730    History   Chief Complaint Chief Complaint  Patient presents with  . Otalgia    HPI [name] [name] is a 58 y.o. female.     Patient with complaint of right ear pain onset over a week ago. She was seen by her PCP last week, diagnosed with an "abscess" and started on two antibiotics. She reports worsening pain, along with hearing loss. Chills, no documented fevers. Occasional cough.  The history is provided by the patient. No language interpreter was used.  Otalgia  Location:  Right Quality:  Throbbing Onset quality:  Gradual Duration:  1 week Timing:  Constant Progression:  Worsening Chronicity:  New Associated symptoms: hearing loss     Past Medical History:  Diagnosis Date  . Acid reflux   . CAD (coronary artery disease)    S/P cath with stent in 2001  . COPD (chronic obstructive pulmonary disease) (HCC)   . CVA (cerebral vascular accident) (HCC) 1992  . DU (duodenal ulcer) 09/19/09  . Hiatal hernia    EGD september 2007. normal except for small hiatal hernia. She underwent small bowel biopsy with history of diarrhea at that time. This was negative  . History of colonoscopy    August 2007. She had a pedunculated polyp at 30 cm. which was hamartomatous polyp. She  is due for 5-year followup with family history of coln cancer in a brother at age 60  . History of colonoscopy    2005 revealed a linear ulcer with scar versus inflammatory process at the mid right colon, but normal terminal ileum. A biopsy of this area revealed an ulceration, but nonspecific  . History of coronary angiogram    CT angiogram in 2007. negative with normal mesenteric arteries  . History of kidney stones   . Migraine   . Pulmonary embolus (HCC) 1999  . Ulcerative colitis (HCC)   . Urinary frequency     Patient Active Problem List   Diagnosis Date Noted  . Community acquired pneumonia  04/09/2014  . Pneumonia 04/09/2014  . Dysuria 11/18/2011  . Family history of colon cancer 11/18/2011  . GERD 09/18/2009  . DYSPHAGIA UNSPECIFIED 09/18/2009  . EPIGASTRIC PAIN 09/18/2009  . SMOKER 09/15/2009  . MIGRAINE HEADACHE 09/15/2009  . Coronary atherosclerosis 09/15/2009  . CVA 09/15/2009  . WEIGHT LOSS 09/15/2009  . VOMITING 09/15/2009  . URINARY FREQUENCY 09/15/2009  . ABDOMINAL PAIN OTHER SPECIFIED SITE 09/15/2009  . PULMONARY EMBOLISM, HX OF 09/15/2009  . CONSTIPATION, CHRONIC, HX OF 09/15/2009  . RENAL CALCULUS, HX OF 09/15/2009    Past Surgical History:  Procedure Laterality Date  . ABDOMINAL HYSTERECTOMY    . APPENDECTOMY    . CESAREAN SECTION    . CHOLECYSTECTOMY    . COLONOSCOPY  01/2006   Rourk-pedunculated polyp 30 cm hamartomatous polyp,  . COLONOSCOPY  2005   Lineal ulcer or mid right colon, normal TI nonspecific biopsy  . CORONARY ANGIOPLASTY WITH STENT PLACEMENT  2011   Dr. Corinda [facility], [place]  . ESOPHAGOGASTRODUODENOSCOPY  09/19/09   Rourk -2 duodenal bulbar ulcers, small HH otherwise normal/tiny distal esophageal erosions with mild erosive reflux  . ESOPHAGOGASTRODUODENOSCOPY  03/2006   Rourk-Hiatal hernia, negative small bowel biopsy  . TONSILLECTOMY    . Unilateral oophorectomy with hysterectomy       OB History   No obstetric history on file.      Home Medications  Prior to Admission medications   Medication Sig Start Date End Date Taking? Authorizing Provider  albuterol (PROVENTIL HFA;VENTOLIN HFA) 108 (90 BASE) MCG/ACT inhaler Inhale 2 puffs into the lungs every 6 (six) hours as needed for shortness of breath.     [provider]  albuterol (PROVENTIL) (2.5 MG/3ML) 0.083% nebulizer solution Take 2.5 mg by nebulization every 6 (six) hours as needed for wheezing or shortness of breath.    [provider]  aspirin EC 81 MG tablet Take 81 mg by mouth daily.    [provider]  atorvastatin (LIPITOR) 40 MG tablet Take 1  tablet (40 mg total) by mouth daily. 10/30/17 01/28/18  Antoine Poche, MD  guaiFENesin (MUCINEX) 600 MG 12 hr tablet Take 1 tablet (600 mg total) by mouth 2 (two) times daily. 04/11/14   Erick Blinks, MD  ondansetron (ZOFRAN-ODT) 4 MG disintegrating tablet Take 4 mg by mouth every 8 (eight) hours as needed for nausea or vomiting.    [provider]    Family History Family History  Problem Relation Age of Onset  . Colon cancer Brother 39       died 1 year later  . Heart attack Mother 74  . Diabetes Sister     Social History Social History   Tobacco Use  . Smoking status: Current Every Day Smoker    Packs/day: 0.50    Years: 35.00    Pack years: 17.50    Types: Cigarettes  . Smokeless tobacco: Never Used  Substance Use Topics  . Alcohol use: No  . Drug use: No     Allergies   Hydrocodone-acetaminophen   Review of Systems Review of Systems  HENT: Positive for ear pain and hearing loss.   All other systems reviewed and are negative.    Physical Exam Updated Vital Signs BP (!) 155/93 (BP Location: Right Arm)   Pulse 99   Temp 98 F (36.7 C) (Oral)   Resp 17   SpO2 92%   Physical Exam Vitals signs reviewed.  Constitutional:      Appearance: Normal appearance.  HENT:     Right Ear: Decreased hearing noted. Swelling and tenderness present. There is mastoid tenderness. Tympanic membrane is not bulging.     Ears:     Comments: Tenderness to the mastoid area and superior to the tragus. Erythema of the tympanic membrane.    Mouth/Throat:     Mouth: Mucous membranes are moist.  Eyes:     Conjunctiva/sclera: Conjunctivae normal.  Cardiovascular:     Rate and Rhythm: Normal rate and regular rhythm.  Pulmonary:     Effort: Pulmonary effort is normal.     Breath sounds: Normal breath sounds.  Lymphadenopathy:     Cervical: No cervical adenopathy.  Skin:    General: Skin is warm and dry.  Neurological:     Mental Status: She is alert and oriented  to person, place, and time.  Psychiatric:        Mood and Affect: Mood normal.      ED Treatments / Results  Labs (all labs ordered are listed, but only abnormal results are displayed) Labs Reviewed  CBC WITH DIFFERENTIAL/PLATELET - Abnormal; Notable for the following components:      Result Value   Eosinophils Absolute 1.4 (*)    All other components within normal limits  BASIC METABOLIC PANEL - Abnormal; Notable for the following components:   Glucose, Bld 105 (*)    All other components within  normal limits    EKG None  Radiology Ct Temporal Bones W Contrast  Result Date: 10/29/2018 CLINICAL DATA:  Pain and swelling of the right ear, worsening over the last 2 days. Antibiotic treatment over the last week. Right mastoid tenderness and hearing loss. EXAM: CT TEMPORAL BONES WITH CONTRAST TECHNIQUE: Axial and coronal plane CT imaging of the petrous temporal bones was performed with thin-collimation image reconstruction after intravenous contrast administration. Multiplanar CT image reconstructions were also generated. CONTRAST:  75mL OMNIPAQUE IOHEXOL 300 MG/ML  SOLN COMPARISON:  CT 10/05/2017 FINDINGS: Both temporal bones appear normal. Mastoid air cells are normally aerated. No mastoid fluid. No cholesteatoma. External auditory canals are normal. Tip ending membranes are normal. Ossicular chains are normal. No fluid in either middle ear or attic region. Inner ear structures appear normal. Temporomandibular joints appear normal. Intracranial contents appear normal in the region studied. No evidence of inflammatory sinus disease. IMPRESSION: Negative CT scan. No evidence of mastoiditis on the right. No abnormality seen to explain the clinical presentation. Electronically Signed   By: Paulina Fusi M.D.   On: 10/29/2018 20:02    Procedures Procedures (including critical care time)  Medications Ordered in ED Medications - No data to display   Initial Impression / Assessment and Plan /  ED Course  I have reviewed the triage vital signs and the nursing notes.  Pertinent labs & imaging results that were available during my care of the patient were reviewed by me and considered in my medical decision making (see chart for details).        Pt presenting with right otalgia. Pt afebrile in NAD. No mastoiditis or other concerning inner ear findings.  Possible celluliits--patient is current on antibiotics .  Advised follow up with ENT in 2-3 days if no improvement.  Return precautions discussed. Pt appears safe for discharge.  Final Clinical Impressions(s) / ED Diagnoses   Final diagnoses:  Right ear pain    ED Discharge Orders         Ordered    oxyCODONE-acetaminophen (PERCOCET/ROXICET) 5-325 MG tablet  Every 8 hours PRN     10/29/18 2127           Felicie Morn, NP 10/30/18 1914    Mancel Bale, MD 10/30/18 1046

## 2018-11-16 ENCOUNTER — Other Ambulatory Visit: Payer: Self-pay

## 2018-11-16 ENCOUNTER — Ambulatory Visit (INDEPENDENT_AMBULATORY_CARE_PROVIDER_SITE_OTHER): Payer: Self-pay | Admitting: Otolaryngology

## 2018-11-16 DIAGNOSIS — H9209 Otalgia, unspecified ear: Secondary | ICD-10-CM

## 2019-03-25 ENCOUNTER — Other Ambulatory Visit: Payer: Self-pay

## 2019-03-25 ENCOUNTER — Telehealth (INDEPENDENT_AMBULATORY_CARE_PROVIDER_SITE_OTHER): Payer: Self-pay | Admitting: Cardiology

## 2019-03-25 ENCOUNTER — Ambulatory Visit (INDEPENDENT_AMBULATORY_CARE_PROVIDER_SITE_OTHER): Payer: Self-pay | Admitting: *Deleted

## 2019-03-25 ENCOUNTER — Encounter: Payer: Self-pay | Admitting: Cardiology

## 2019-03-25 VITALS — BP 97/66 | HR 93 | Ht 64.0 in | Wt 105.8 lb

## 2019-03-25 DIAGNOSIS — Z8673 Personal history of transient ischemic attack (TIA), and cerebral infarction without residual deficits: Secondary | ICD-10-CM

## 2019-03-25 DIAGNOSIS — R296 Repeated falls: Secondary | ICD-10-CM

## 2019-03-25 DIAGNOSIS — W19XXXA Unspecified fall, initial encounter: Secondary | ICD-10-CM

## 2019-03-25 NOTE — Progress Notes (Signed)
Patient in office this evening for EKG & orthostatics.  Stated that she was just not feeling well - having a lot of fatigue, just c/o feeling washed out.  Patient visibly looked very tired.

## 2019-03-25 NOTE — Progress Notes (Signed)
Virtual Visit via Telephone Note   This visit type was conducted due to national recommendations for restrictions regarding the COVID-19 Pandemic (e.g. social distancing) in an effort to limit this patient's exposure and mitigate transmission in our community.  Due to her co-morbid illnesses, this patient is at least at moderate risk for complications without adequate follow up.  This format is felt to be most appropriate for this patient at this time.  The patient did not have access to video technology/had technical difficulties with video requiring transitioning to audio format only (telephone).  All issues noted in this document were discussed and addressed.  No physical exam could be performed with this format.  Please refer to the patient's chart for her  consent to telehealth for [facility].   Date:  03/25/2019   ID:  [name] [name], DOB 01/25/1961, MRN [id number]  Patient Location: Home Provider Location: Office  PCP:  Selinda FlavinHoward, Kevin, MD  Cardiologist:  Dina RichBranch, Whitman Meinhardt, MD  Electrophysiologist:  None   Evaluation Performed:  Follow-Up Visit  Chief Complaint:  Follow up  History of Present Illness:    [name] [name] is a 58 y.o. female seen for a focused visit on recent issues with falls.     1. Falls - 3 falls recently over the last few days - first episode while standing in kitchen. Fell backwards. No loss of consciounsness.  - the next episode occurred while walking across the street. No loss of conscionuess - next episode while outside walking outside fell again. No LOC. No prodrome.  - can have orthostatic symptoms. Mild vomiting/diarrhea x 1 week, +nausea.  - water bottle x 2, 2.5 cups of coffee daily, drinks iced tea or Dr pepper  - has had some palpitations x 1 month - weight today 105 lbs, she was 122 lbs a few months ago.Unintentional weight loss. Overall decreased appetite.   The patient does not have symptoms concerning for COVID-19 infection (fever,  chills, cough, or new shortness of breath).    Past Medical History:  Diagnosis Date  . Acid reflux   . CAD (coronary artery disease)    S/P cath with stent in 2001  . COPD (chronic obstructive pulmonary disease) (HCC)   . CVA (cerebral vascular accident) (HCC) 1992  . DU (duodenal ulcer) 09/19/09  . Hiatal hernia    EGD september 2007. normal except for small hiatal hernia. She underwent small bowel biopsy with history of diarrhea at that time. This was negative  . History of colonoscopy    August 2007. She had a pedunculated polyp at 30 cm. which was hamartomatous polyp. She  is due for 5-year followup with family history of coln cancer in a brother at age 60  . History of colonoscopy    2005 revealed a linear ulcer with scar versus inflammatory process at the mid right colon, but normal terminal ileum. A biopsy of this area revealed an ulceration, but nonspecific  . History of coronary angiogram    CT angiogram in 2007. negative with normal mesenteric arteries  . History of kidney stones   . Migraine   . Pulmonary embolus (HCC) 1999  . Ulcerative colitis (HCC)   . Urinary frequency    Past Surgical History:  Procedure Laterality Date  . ABDOMINAL HYSTERECTOMY    . APPENDECTOMY    . CESAREAN SECTION    . CHOLECYSTECTOMY    . COLONOSCOPY  01/2006   Rourk-pedunculated polyp 30 cm hamartomatous polyp,  . COLONOSCOPY  2005  Lineal ulcer or mid right colon, normal TI nonspecific biopsy  . CORONARY ANGIOPLASTY WITH STENT PLACEMENT  2011   Dr. Corinda Gubler, [place]  . ESOPHAGOGASTRODUODENOSCOPY  09/19/09   Rourk -2 duodenal bulbar ulcers, small HH otherwise normal/tiny distal esophageal erosions with mild erosive reflux  . ESOPHAGOGASTRODUODENOSCOPY  03/2006   Rourk-Hiatal hernia, negative small bowel biopsy  . TONSILLECTOMY    . Unilateral oophorectomy with hysterectomy       Current Meds  Medication Sig  . albuterol (PROVENTIL HFA;VENTOLIN HFA) 108 (90 BASE) MCG/ACT inhaler Inhale 2  puffs into the lungs every 6 (six) hours as needed for shortness of breath.   Marland Kitchen albuterol (PROVENTIL) (2.5 MG/3ML) 0.083% nebulizer solution Take 2.5 mg by nebulization every 6 (six) hours as needed for wheezing or shortness of breath.  Marland Kitchen aspirin EC 81 MG tablet Take 81 mg by mouth daily.  . buprenorphine (SUBUTEX) 8 MG SUBL SL tablet Place 8 mg under the tongue 2 (two) times daily.  Marland Kitchen guaiFENesin (MUCINEX) 600 MG 12 hr tablet Take 1 tablet (600 mg total) by mouth 2 (two) times daily.  Marland Kitchen omeprazole (PRILOSEC) 40 MG capsule Take 40 mg by mouth daily.  . ondansetron (ZOFRAN-ODT) 4 MG disintegrating tablet Take 4 mg by mouth every 8 (eight) hours as needed for nausea or vomiting.     Allergies:   Hydrocodone-acetaminophen   Social History   Tobacco Use  . Smoking status: Current Every Day Smoker    Packs/day: 0.50    Years: 35.00    Pack years: 17.50    Types: Cigarettes  . Smokeless tobacco: Never Used  Substance Use Topics  . Alcohol use: No  . Drug use: No     Family Hx: The patient's family history includes Colon cancer (age of onset: 64) in her brother; Diabetes in her sister; Heart attack (age of onset: 55) in her mother.  ROS:   Please see the history of present illness.     All other systems reviewed and are negative.   Prior CV studies:   The following studies were reviewed today:  Labs/Other Tests and Data Reviewed:    EKG:  No ECG reviewed.  Recent Labs: 10/29/2018: BUN 12; Creatinine, Ser 0.70; Hemoglobin 13.1; Platelets 300; Potassium 3.5; Sodium 135   Recent Lipid Panel Lab Results  Component Value Date/Time   CHOL (H) 08/31/2008 02:15 AM    204        ATP III CLASSIFICATION:  <200     mg/dL   Desirable  409-811  mg/dL   Borderline High  >=914    mg/dL   High          TRIG 782 08/31/2008 02:15 AM   HDL 36 (L) 08/31/2008 02:15 AM   CHOLHDL 5.7 08/31/2008 02:15 AM   LDLCALC (H) 08/31/2008 02:15 AM    147        Total Cholesterol/HDL:CHD Risk  Coronary Heart Disease Risk Table                     Men   Women  1/2 Average Risk   3.4   3.3  Average Risk       5.0   4.4  2 X Average Risk   9.6   7.1  3 X Average Risk  23.4   11.0        Use the calculated Patient Ratio above and the CHD Risk Table to determine the patient's CHD Risk.  ATP III CLASSIFICATION (LDL):  <100     mg/dL   Optimal  100-129  mg/dL   Near or Above                    Optimal  130-159  mg/dL   Borderline  160-189  mg/dL   High  >190     mg/dL   Very High    Wt Readings from Last 3 Encounters:  03/25/19 105 lb 12.8 oz (48 kg)  10/30/17 122 lb (55.3 kg)  04/11/14 143 lb (64.9 kg)     Objective:    Vital Signs:  BP 97/66   Pulse 93   Ht 5\' 4"  (1.626 m)   Wt 105 lb 12.8 oz (48 kg)   BMI 18.16 kg/m    Normal affect. Normal speech pattern and tone. Comfortable no apparent distress, no audible signs of SOB or wheezing.   ASSESSMENT & PLAN:    1. Falls - no syncope, no prodrome - poor oral hydration, recent 17 lbs weight loss over the last few months, some issues with N/VD. She reports some chronic orthostatic symptoms, chronic leg weakness and gait instability - she will come into clinic for an EKG and orthostatics - really needs an internal medicine evaluation. History not really reflective of a specific cardiac cause and needs a more broad assessment particularly with weight loss, N/VD, low bp, chronic gait instability   She will come to clinic for EKG and orthostatics  COVID-19 Education: The signs and symptoms of COVID-19 were discussed with the patient and how to seek care for testing (follow up with PCP or arrange E-visit).  The importance of social distancing was discussed today.  Time:   Today, I have spent  20  minutes with the patient with telehealth technology discussing the above problems.     Medication Adjustments/Labs and Tests Ordered: Current medicines are reviewed at length with the patient today.  Concerns  regarding medicines are outlined above.   Tests Ordered: No orders of the defined types were placed in this encounter.   Medication Changes: No orders of the defined types were placed in this encounter.   Follow Up:  Pending EKG and orthostatic findings  Signed, Carlyle Dolly, MD  03/25/2019 8:43 AM

## 2019-03-25 NOTE — Patient Instructions (Signed)
Medication Instructions:  Your physician recommends that you continue on your current medications as directed. Please refer to the Current Medication list given to you today.   Labwork: NONE  Testing/Procedures: NONE  Follow-Up: Your physician recommends that you schedule a follow-up appointment in: TODAY @ 3:30 AT THE [facility]ICE    Any Other Special Instructions Will Be Listed Below (If Applicable).  PLEASE GO TO THE [facility]ICE TODAY FOR ORTHOSTATICS & AN EKG    If you need a refill on your cardiac medications before your next appointment, please call your pharmacy.

## 2019-03-29 NOTE — Progress Notes (Signed)
Orthostatics are located under the extended vitals section -   Lying - 99/65  80  Sitting - 118/67  85  Standing - 110/73  86  Standing after 3 min - 109/73  86

## 2019-03-29 NOTE — Progress Notes (Signed)
Orthostatics look ok other than low normal blood pressure. Continue to recommend she be evaluated by her pcp, her general symptoms are not cardiac specific   J Valeree Leidy MD

## 2019-03-29 NOTE — Progress Notes (Signed)
Patient notified and verbalized understanding. 

## 2019-04-06 ENCOUNTER — Other Ambulatory Visit: Payer: Self-pay

## 2019-04-06 DIAGNOSIS — Z20822 Contact with and (suspected) exposure to covid-19: Secondary | ICD-10-CM

## 2019-04-08 LAB — NOVEL CORONAVIRUS, NAA: SARS-CoV-2, NAA: NOT DETECTED

## 2019-07-02 HISTORY — PX: CHOLECYSTECTOMY: SHX55

## 2019-07-28 ENCOUNTER — Ambulatory Visit (INDEPENDENT_AMBULATORY_CARE_PROVIDER_SITE_OTHER): Payer: Medicaid Other | Admitting: Pulmonary Disease

## 2019-07-28 ENCOUNTER — Encounter: Payer: Self-pay | Admitting: Pulmonary Disease

## 2019-07-28 ENCOUNTER — Ambulatory Visit (INDEPENDENT_AMBULATORY_CARE_PROVIDER_SITE_OTHER): Payer: Medicaid Other

## 2019-07-28 ENCOUNTER — Other Ambulatory Visit: Payer: Self-pay

## 2019-07-28 VITALS — BP 90/58 | HR 80 | Temp 97.9°F | Ht 64.0 in | Wt 118.6 lb

## 2019-07-28 DIAGNOSIS — J449 Chronic obstructive pulmonary disease, unspecified: Secondary | ICD-10-CM

## 2019-07-28 LAB — COMPREHENSIVE METABOLIC PANEL
ALT: 59 U/L — ABNORMAL HIGH (ref 0–35)
AST: 21 U/L (ref 0–37)
Albumin: 4.2 g/dL (ref 3.5–5.2)
Alkaline Phosphatase: 92 U/L (ref 39–117)
BUN: 16 mg/dL (ref 6–23)
CO2: 30 mEq/L (ref 19–32)
Calcium: 9.3 mg/dL (ref 8.4–10.5)
Chloride: 105 mEq/L (ref 96–112)
Creatinine, Ser: 0.66 mg/dL (ref 0.40–1.20)
GFR: 91.27 mL/min (ref >60.00)
Glucose, Bld: 92 mg/dL (ref 70–99)
Potassium: 4 mEq/L (ref 3.5–5.1)
Sodium: 140 mEq/L (ref 135–145)
Total Bilirubin: 0.3 mg/dL (ref 0.2–1.2)
Total Protein: 6.4 g/dL (ref 6.0–8.3)

## 2019-07-28 LAB — CBC WITH DIFFERENTIAL/PLATELET
Basophils Absolute: 0.1 10*3/uL (ref 0.0–0.1)
Basophils Relative: 1.1 % (ref 0.0–3.0)
Eosinophils Absolute: 2.4 10*3/uL — ABNORMAL HIGH (ref 0.0–0.7)
Eosinophils Relative: 27.7 % — ABNORMAL HIGH (ref 0.0–5.0)
HCT: 37.4 % (ref 36.0–46.0)
Hemoglobin: 12.4 g/dL (ref 12.0–15.0)
Lymphocytes Relative: 24.6 % (ref 12.0–46.0)
Lymphs Abs: 2.2 10*3/uL (ref 0.7–4.0)
MCHC: 33.3 g/dL (ref 30.0–36.0)
MCV: 89 fl (ref 78.0–100.0)
Monocytes Absolute: 0.8 10*3/uL (ref 0.1–1.0)
Monocytes Relative: 8.6 % (ref 3.0–12.0)
Neutro Abs: 3.3 10*3/uL (ref 1.4–7.7)
Neutrophils Relative %: 38 % — ABNORMAL LOW (ref 43.0–77.0)
Platelets: 282 10*3/uL (ref 150.0–400.0)
RBC: 4.2 Mil/uL (ref 3.87–5.11)
RDW: 13.4 % (ref 11.5–15.5)
WBC: 8.8 10*3/uL (ref 4.0–10.5)

## 2019-07-28 MED ORDER — PREDNISONE 20 MG PO TABS: 40.0000 mg | ORAL_TABLET | Freq: Every day | ORAL | 0 refills | Status: DC

## 2019-07-28 MED ORDER — IPRATROPIUM-ALBUTEROL 0.5-2.5 (3) MG/3ML IN SOLN: 3.0000 mL | RESPIRATORY_TRACT | 11 refills | Status: DC | PRN

## 2019-07-28 NOTE — Patient Instructions (Addendum)
We will get a chest x-ray today Check CBC differential, CMP, alpha-1 antitrypsin levels and phenotype, IgE We will give you prednisone 40 mg a day for 5 days We will give you samples of breztri inhaler Refer to pharmacy to determine medication coverage and inhaler training Schedule pulmonary function test  Follow-up in 1 to 2 months with

## 2019-07-28 NOTE — Progress Notes (Signed)
[name] [name]    [id number]    09-Jun-1961  Primary Care Physician:Howard, Caryn Bee, MD  Referring Physician: Selinda Flavin, MD [address] [place],  [place] [address]  Chief complaint: Consult for COPD  HPI: 59 year old with history of coronary artery disease, COPD, allergies, active smoker, CVA Referred for evaluation of dyspnea, cough, wheezing for the past 3 months She was diagnosed with COVID-19 in November 2019.  Treated at home with prednisone, antibiotics.  She states that her breathing has been poor ever since then Just using albuterol inhaler.  She has been prescribed Trelegy but is not covered by her insurance.  History notable for DVT, PE in 1990s for which she was treated with Coumadin for several months.  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  No feather pillows or comforter Smoking history: 40-pack-year smoker.  Continues to smoke half pack per day Travel history: No significant travel Relevant family history: No significant family history of lung disease   Outpatient Encounter Medications as of 07/28/2019  Medication Sig  . albuterol (PROVENTIL HFA;VENTOLIN HFA) 108 (90 BASE) MCG/ACT inhaler Inhale 2 puffs into the lungs every 6 (six) hours as needed for shortness of breath.   Marland Kitchen albuterol (PROVENTIL) (2.5 MG/3ML) 0.083% nebulizer solution Take 2.5 mg by nebulization every 6 (six) hours as needed for wheezing or shortness of breath.  Marland Kitchen aspirin EC 81 MG tablet Take 81 mg by mouth daily.  . buprenorphine (SUBUTEX) 8 MG SUBL SL tablet Place 8 mg under the tongue 2 (two) times daily.  Marland Kitchen guaiFENesin (MUCINEX) 600 MG 12 hr tablet Take 1 tablet (600 mg total) by mouth 2 (two) times daily.  Marland Kitchen omeprazole (PRILOSEC) 40 MG capsule Take 40 mg by mouth daily.  . ondansetron (ZOFRAN-ODT) 4 MG disintegrating tablet Take 4 mg by mouth every 8 (eight) hours as needed for nausea or  vomiting.  . [DISCONTINUED] atorvastatin (LIPITOR) 40 MG tablet Take 1 tablet (40 mg total) by mouth daily. (Patient not taking: Reported on 03/25/2019)   No facility-administered encounter medications on file as of 07/28/2019.    Allergies as of 07/28/2019 - Review Complete 07/28/2019  Allergen Reaction Noted  . Codeine  11/08/2010  . Hydrocodone  07/31/2012  . Sulfa antibiotics  07/08/2013  . Hydrocodone-acetaminophen Hives, Nausea And Vomiting, and Anxiety     Past Medical History:  Diagnosis Date  . Acid reflux   . CAD (coronary artery disease)    S/P cath with stent in 2001  . COPD (chronic obstructive pulmonary disease) (HCC)   . CVA (cerebral vascular accident) (HCC) 1992  . DU (duodenal ulcer) 09/19/09  . Hiatal hernia    EGD september 2007. normal except for small hiatal hernia. She underwent small bowel biopsy with history of diarrhea at that time. This was negative  . History of colonoscopy    August 2007. She had a pedunculated polyp at 30 cm. which was hamartomatous polyp. She  is due for 5-year followup with family history of coln cancer in a brother at age 56  . History of colonoscopy    2005 revealed a linear ulcer with scar versus inflammatory process at the mid right colon, but normal terminal ileum. A biopsy of this area revealed an ulceration, but nonspecific  . History of coronary angiogram    CT angiogram in 2007. negative with normal mesenteric arteries  . History  of kidney stones   . Migraine   . Pulmonary embolus ([place]) 1999  . Ulcerative colitis ([place])   . Urinary frequency     Past Surgical History:  Procedure Laterality Date  . ABDOMINAL HYSTERECTOMY    . APPENDECTOMY    . CESAREAN SECTION    . CHOLECYSTECTOMY    . COLONOSCOPY  01/2006   Rourk-pedunculated polyp 30 cm hamartomatous polyp,  . COLONOSCOPY  2005   Lineal ulcer or mid right colon, normal TI nonspecific biopsy  . CORONARY ANGIOPLASTY WITH STENT PLACEMENT  2011   Dr. Velora Heckler, [place]  .  ESOPHAGOGASTRODUODENOSCOPY  09/19/09   Rourk -2 duodenal bulbar ulcers, small HH otherwise normal/tiny distal esophageal erosions with mild erosive reflux  . ESOPHAGOGASTRODUODENOSCOPY  03/2006   Rourk-Hiatal hernia, negative small bowel biopsy  . TONSILLECTOMY    . Unilateral oophorectomy with hysterectomy      Family History  Problem Relation Age of Onset  . Colon cancer Brother 21       died 1 year later  . Heart attack Mother 42  . Diabetes Sister     Social History   Socioeconomic History  . Marital status: Married    Spouse name: Not on file  . Number of children: 2  . Years of education: Not on file  . Highest education level: Not on file  Occupational History  . Occupation: [employer]  Tobacco Use  . Smoking status: Current Every Day Smoker    Packs/day: 0.25    Years: 35.00    Pack years: 8.75    Types: Cigarettes  . Smokeless tobacco: Never Used  . Tobacco comment: 1/3 of a pack 07/28/19  Substance and Sexual Activity  . Alcohol use: No  . Drug use: No  . Sexual activity: Not on file  Other Topics Concern  . Not on file  Social History Narrative  . Not on file   Social Determinants of Health   Financial Resource Strain:   . Difficulty of Paying Living Expenses: Not on file  Food Insecurity:   . Worried About Charity fundraiser in the Last Year: Not on file  . Ran Out of Food in the Last Year: Not on file  Transportation Needs:   . Lack of Transportation (Medical): Not on file  . Lack of Transportation (Non-Medical): Not on file  Physical Activity:   . Days of Exercise per Week: Not on file  . Minutes of Exercise per Session: Not on file  Stress:   . Feeling of Stress : Not on file  Social Connections:   . Frequency of Communication with Friends and Family: Not on file  . Frequency of Social Gatherings with Friends and Family: Not on file  . Attends Religious Services: Not on file  . Active Member of Clubs or Organizations: Not on  file  . Attends Archivist Meetings: Not on file  . Marital Status: Not on file  Intimate Partner Violence:   . Fear of Current or Ex-Partner: Not on file  . Emotionally Abused: Not on file  . Physically Abused: Not on file  . Sexually Abused: Not on file    Review of systems: Review of Systems  Constitutional: Negative for fever and chills.  HENT: Negative.   Eyes: Negative for blurred vision.  Respiratory: as per HPI  Cardiovascular: Negative for chest pain and palpitations.  Gastrointestinal: Negative for vomiting, diarrhea, blood per rectum. Genitourinary: Negative for dysuria, urgency, frequency and hematuria.  Musculoskeletal: Negative for myalgias, back pain and joint pain.  Skin: Negative for itching and rash.  Neurological: Negative for dizziness, tremors, focal weakness, seizures and loss of consciousness.  Endo/Heme/Allergies: Negative for environmental allergies.  Psychiatric/Behavioral: Negative for depression, suicidal ideas and hallucinations.  All other systems reviewed and are negative.  Physical Exam: Blood pressure (!) 90/58, pulse 80, temperature 97.9 F (36.6 C), temperature source Temporal, height 5\' 4"  (1.626 m), weight 118 lb 9.6 oz (53.8 kg), SpO2 94 %. Gen:      No acute distress HEENT:  EOMI, sclera anicteric Neck:     No masses; no thyromegaly Lungs:    Bilateral wheeze, crackle CV:         Regular rate and rhythm; no murmurs Abd:      + bowel sounds; soft, non-tender; no palpable masses, no distension Ext:    No edema; adequate peripheral perfusion Skin:      Warm and dry; no rash Neuro: alert and oriented x 3 Psych: normal mood and affect  Data Reviewed:  Assessment:  COPD with exacerbation She has marked wheeze with crackles on examination WE will give depot injectionb. Prednisone for 5 days Breztri samples and prescription Prescribed duo nebs for home Referral to pharmacy for inhaler training and management  Chest x-ray, labs  including CBC, IgE today Schedule pulmonary function tests when stable  Active smoker We will need referral to low-dose screening CT on return visit Smoking cessation discussed in detail with patient.  She wants to quit on her own.  Time spent counseling-5 mins Reassess at return visit  Plan/Recommendations: - Chest x-ray, CBC, CMP, IgE, alpha-1 antitrypsin - Depo injection and Prednisone x 5 days - Breztri, duo nebs - PFTs  This appointment required 60 minutes of patient care (this includes precharting, chart review, review of results, face-to-face care, etc.).  MD [facility] 07/28/2019, 10:18 AM  CC: 07/30/2019, MD

## 2019-07-28 NOTE — Addendum Note (Signed)
Addended by: Edwina Barth I on: 07/28/2019 12:43 PM   Modules accepted: Orders

## 2019-07-28 NOTE — Addendum Note (Signed)
Addended by: Demetrio Lapping E on: 07/28/2019 10:52 AM   Modules accepted: Orders

## 2019-08-01 LAB — ALPHA-1 ANTITRYPSIN PHENOTYPE: A-1 Antitrypsin, Ser: 156 mg/dL (ref 83–199)

## 2019-09-25 ENCOUNTER — Other Ambulatory Visit (HOSPITAL_COMMUNITY): Admission: RE | Admit: 2019-09-25 | Payer: MEDICAID | Source: Ambulatory Visit

## 2019-09-28 ENCOUNTER — Ambulatory Visit: Payer: Medicaid Other | Admitting: Primary Care

## 2019-10-02 ENCOUNTER — Other Ambulatory Visit (HOSPITAL_COMMUNITY)
Admission: RE | Admit: 2019-10-02 | Discharge: 2019-10-02 | Disposition: A | Discharge status: Home / Self Care | Payer: MEDICAID | Source: Ambulatory Visit | Attending: Pulmonary Disease | Admitting: Pulmonary Disease

## 2019-10-02 DIAGNOSIS — Z20822 Contact with and (suspected) exposure to covid-19: Secondary | ICD-10-CM | POA: Insufficient documentation

## 2019-10-02 DIAGNOSIS — Z01812 Encounter for preprocedural laboratory examination: Secondary | ICD-10-CM | POA: Insufficient documentation

## 2019-10-02 LAB — SARS CORONAVIRUS 2 (TAT 6-24 HRS): SARS Coronavirus 2: NEGATIVE

## 2019-10-05 ENCOUNTER — Encounter: Payer: Self-pay | Admitting: Primary Care

## 2019-10-05 ENCOUNTER — Other Ambulatory Visit: Payer: Self-pay

## 2019-10-05 ENCOUNTER — Ambulatory Visit (INDEPENDENT_AMBULATORY_CARE_PROVIDER_SITE_OTHER): Payer: Medicaid Other | Admitting: Primary Care

## 2019-10-05 ENCOUNTER — Ambulatory Visit (INDEPENDENT_AMBULATORY_CARE_PROVIDER_SITE_OTHER): Payer: Medicaid Other | Admitting: Pulmonary Disease

## 2019-10-05 DIAGNOSIS — J449 Chronic obstructive pulmonary disease, unspecified: Secondary | ICD-10-CM | POA: Diagnosis not present

## 2019-10-05 LAB — PULMONARY FUNCTION TEST
DL/VA % pred: 80 %
DL/VA: 3.42 ml/min/mmHg/L
DLCO cor % pred: 76 %
DLCO cor: 14.86 ml/min/mmHg
DLCO unc % pred: 76 %
DLCO unc: 14.86 ml/min/mmHg
FEF 25-75 Post: 1.42 L/sec
FEF 25-75 Pre: 1.27 L/sec
FEF2575-%Change-Post: 11 %
FEF2575-%Pred-Post: 61 %
FEF2575-%Pred-Pre: 55 %
FEV1-%Change-Post: 2 %
FEV1-%Pred-Post: 85 %
FEV1-%Pred-Pre: 82 %
FEV1-Post: 2.1 L
FEV1-Pre: 2.04 L
FEV1FVC-%Change-Post: 8 %
FEV1FVC-%Pred-Pre: 91 %
FEV6-%Change-Post: -4 %
FEV6-%Pred-Post: 88 %
FEV6-%Pred-Pre: 92 %
FEV6-Post: 2.71 L
FEV6-Pre: 2.84 L
FEV6FVC-%Change-Post: 0 %
FEV6FVC-%Pred-Post: 103 %
FEV6FVC-%Pred-Pre: 102 %
FVC-%Change-Post: -4 %
FVC-%Pred-Post: 85 %
FVC-%Pred-Pre: 90 %
FVC-Post: 2.73 L
FVC-Pre: 2.87 L
Post FEV1/FVC ratio: 77 %
Post FEV6/FVC ratio: 99 %
Pre FEV1/FVC ratio: 71 %
Pre FEV6/FVC Ratio: 99 %
RV % pred: 117 %
RV: 2.28 L
TLC % pred: 105 %
TLC: 5.17 L

## 2019-10-05 MED ORDER — MONTELUKAST SODIUM 10 MG PO TABS: 10.0000 mg | ORAL_TABLET | Freq: Every day | ORAL | 6 refills | Status: DC

## 2019-10-05 MED ORDER — BREZTRI AEROSPHERE 160-9-4.8 MCG/ACT IN AERO: 2.0000 | INHALATION_SPRAY | Freq: Two times a day (BID) | RESPIRATORY_TRACT | 3 refills | Status: DC

## 2019-10-05 NOTE — Progress Notes (Signed)
PFT done today. 

## 2019-10-05 NOTE — Progress Notes (Signed)
Virtual Visit via Telephone Note  I connected with [name] [name] on 10/05/19 at  4:00 PM EDT by telephone and verified that I am speaking with the correct person using two identifiers.  Location Patient: Home Provider: Office   I discussed the limitations, risks, security and privacy concerns of performing an evaluation and management service by telephone and the availability of in person appointments. I also discussed with the patient that there may be a patient responsible charge related to this service. The patient expressed understanding and agreed to proceed.   History of Present Illness: 59 year old female, current every day smoker. PMH significant for COPD, CAD, DVT/PE. Patient of Dr. Silvestre Gunner, seen for initial consult on 07/28/19 for dyspnea, cough and wheezing. Diagnosed with Covid-19 in November 2019. Treated with prednisone and antibiotics. Prescribed Trelegy by not covered by insurance. Started on [place] and given depo injection/prednisone x5 days. Ordered for CXR, CBC, CMP, alpha-1 and PFTs.  10/05/2019  Patient presents today for 3 months follow up with PFTs. She has been on Breztri since last visit which she feels has been helpful. Reports some associated wheezing, chest tightness and PND. Albuterol rescue inhaler Denies cough.   Labs: Eos absolute- 2400 Alpha 1- collected  Observations/Objective:  - Able to speak in full sentence; no significant shortness of breath or shortness of breath  Assessment and Plan:  COPD - Current smoker  - PFTs showed mild obstructive lung disease. No restrictive lung disease or significant BD response. - Reports improvement with Breztri inhaler; continue two puffs twice daily - Continue Singulair 10mg  at bedtime - Eosinophils elevated, Alpha-1 not collected  Follow Up Instructions:  - 3 months with Dr. or sooner if needed   I discussed the assessment and treatment plan with the patient. The patient was provided an opportunity to  ask questions and all were answered. The patient agreed with the plan and demonstrated an understanding of the instructions.   The patient was advised to call back or seek an in-person evaluation if the symptoms worsen or if the condition fails to improve as anticipated.  I provided 18 minutes of non-face-to-face time during this encounter.   Silvestre Gunner, NP

## 2019-10-07 ENCOUNTER — Encounter: Payer: Self-pay | Admitting: Primary Care

## 2019-10-07 ENCOUNTER — Telehealth: Payer: Self-pay | Admitting: Primary Care

## 2019-10-07 NOTE — Patient Instructions (Signed)
Continue Breztri two puffs twice daily Fu 3 months follow up or sooner if needed

## 2019-10-08 NOTE — Telephone Encounter (Signed)
Patient has Medicaid and [place] required a PA through [employer]. I called and did a PA and it was approved for 10/08/19-10/02/20. PA approval number is [id number].

## 2019-10-15 NOTE — Telephone Encounter (Signed)
Received multiple PA requests from pharmacy since the pharmacy was not made aware that PA had been approved.  Called pharmacy to inform them of PA approval.  RX ran through with no issues.   Nothing further needed at this time- will close encounter.

## 2020-05-03 DIAGNOSIS — G894 Chronic pain syndrome: Secondary | ICD-10-CM | POA: Diagnosis not present

## 2020-05-03 DIAGNOSIS — M47812 Spondylosis without myelopathy or radiculopathy, cervical region: Secondary | ICD-10-CM | POA: Diagnosis not present

## 2020-05-03 DIAGNOSIS — M5030 Other cervical disc degeneration, unspecified cervical region: Secondary | ICD-10-CM | POA: Diagnosis not present

## 2020-05-03 DIAGNOSIS — Z79891 Long term (current) use of opiate analgesic: Secondary | ICD-10-CM | POA: Diagnosis not present

## 2020-05-03 DIAGNOSIS — Z79899 Other long term (current) drug therapy: Secondary | ICD-10-CM | POA: Diagnosis not present

## 2020-05-03 DIAGNOSIS — M4802 Spinal stenosis, cervical region: Secondary | ICD-10-CM | POA: Diagnosis not present

## 2020-05-08 DIAGNOSIS — M25531 Pain in right wrist: Secondary | ICD-10-CM | POA: Diagnosis not present

## 2020-05-08 DIAGNOSIS — M4722 Other spondylosis with radiculopathy, cervical region: Secondary | ICD-10-CM | POA: Diagnosis not present

## 2020-05-08 DIAGNOSIS — R29898 Other symptoms and signs involving the musculoskeletal system: Secondary | ICD-10-CM | POA: Diagnosis not present

## 2020-05-08 DIAGNOSIS — M25532 Pain in left wrist: Secondary | ICD-10-CM | POA: Diagnosis not present

## 2020-05-17 ENCOUNTER — Ambulatory Visit: Payer: Medicaid Other | Admitting: Gastroenterology

## 2020-05-17 ENCOUNTER — Encounter: Payer: Self-pay | Admitting: Internal Medicine

## 2020-05-23 DIAGNOSIS — M4712 Other spondylosis with myelopathy, cervical region: Secondary | ICD-10-CM | POA: Diagnosis not present

## 2020-05-23 DIAGNOSIS — M542 Cervicalgia: Secondary | ICD-10-CM | POA: Diagnosis not present

## 2020-05-23 DIAGNOSIS — M4312 Spondylolisthesis, cervical region: Secondary | ICD-10-CM | POA: Diagnosis not present

## 2020-05-30 DIAGNOSIS — M542 Cervicalgia: Secondary | ICD-10-CM | POA: Diagnosis not present

## 2020-05-31 DIAGNOSIS — Z79891 Long term (current) use of opiate analgesic: Secondary | ICD-10-CM | POA: Diagnosis not present

## 2020-05-31 DIAGNOSIS — M792 Neuralgia and neuritis, unspecified: Secondary | ICD-10-CM | POA: Diagnosis not present

## 2020-05-31 DIAGNOSIS — G894 Chronic pain syndrome: Secondary | ICD-10-CM | POA: Diagnosis not present

## 2020-05-31 DIAGNOSIS — Z79899 Other long term (current) drug therapy: Secondary | ICD-10-CM | POA: Diagnosis not present

## 2020-06-01 DIAGNOSIS — G894 Chronic pain syndrome: Secondary | ICD-10-CM | POA: Diagnosis not present

## 2020-06-01 DIAGNOSIS — Z79899 Other long term (current) drug therapy: Secondary | ICD-10-CM | POA: Diagnosis not present

## 2020-06-01 DIAGNOSIS — Z79891 Long term (current) use of opiate analgesic: Secondary | ICD-10-CM | POA: Diagnosis not present

## 2020-06-08 DIAGNOSIS — Z79899 Other long term (current) drug therapy: Secondary | ICD-10-CM | POA: Diagnosis not present

## 2020-06-08 DIAGNOSIS — G894 Chronic pain syndrome: Secondary | ICD-10-CM | POA: Diagnosis not present

## 2020-06-08 DIAGNOSIS — Z79891 Long term (current) use of opiate analgesic: Secondary | ICD-10-CM | POA: Diagnosis not present

## 2020-07-14 DIAGNOSIS — J441 Chronic obstructive pulmonary disease with (acute) exacerbation: Secondary | ICD-10-CM | POA: Diagnosis not present

## 2020-07-24 DIAGNOSIS — R059 Cough, unspecified: Secondary | ICD-10-CM | POA: Diagnosis not present

## 2020-11-01 ENCOUNTER — Other Ambulatory Visit: Payer: Self-pay | Admitting: Neurosurgery

## 2020-11-10 NOTE — Progress Notes (Signed)
Surgical Instructions    Your procedure is scheduled on Wednesday, May 18 th, 2022  Report to [facility] Main Entrance "A" at 09:30 A.M., then check in with the Admitting office.  Call this number if you have problems the morning of surgery:  [phone]   If you have any questions prior to your surgery date call [phone]: Open Monday-Friday 8am-4pm    Remember:  Do not eat or drink after midnight the night before your surgery   Take these medicines the morning of surgery with A SIP OF WATER   gabapentin (NEURONTIN) guaiFENesin (MUCINEX) hydrOXYzine (ATARAX/VISTARIL) sertraline (ZOLOFT) Fluticasone-Salmeterol (ADVAIR)   If needed:  albuterol (PROVENTIL HFA;VENTOLIN HFA) - please, bring the inhaler with you albuterol (PROVENTIL) (2.5 MG/3ML) Budeson-Glycopyrrol-Formoterol (BREZTRI AEROSPHERE) - please, bring the inhaler with you ipratropium-albuterol (DUONEB) Omeprazole  tiZANidine (ZANAFLEX)  As of today, STOP taking any Aspirin (unless otherwise instructed by your surgeon) Aleve, Naproxen, Ibuprofen, Motrin, Advil, Goody's, BC's, all herbal medications, fish oil, and all vitamins.                     Do not wear jewelry, make up, or nail polish            Do not wear lotions, powders, perfumes, or deodorant.            Do not shave 48 hours prior to surgery.              Do not bring valuables to the hospital.            [facility] is not responsible for any belongings or valuables.  Do NOT Smoke (Tobacco/Vaping) or drink Alcohol 24 hours prior to your procedure If you use a CPAP at night, you may bring all equipment for your overnight stay.   Contacts, glasses, dentures or bridgework may not be worn into surgery, please bring cases for these belongings   For patients admitted to the hospital, discharge time will be determined by your treatment team.   Patients discharged the day of surgery will not be allowed to drive home, and someone needs to stay with them for  24 hours.    Special instructions:   [facility]- Preparing For Surgery  Before surgery, you can play an important role. Because skin is not sterile, your skin needs to be as free of germs as possible. You can reduce the number of germs on your skin by washing with CHG (chlorahexidine gluconate) Soap before surgery.  CHG is an antiseptic cleaner which kills germs and bonds with the skin to continue killing germs even after washing.    Oral Hygiene is also important to reduce your risk of infection.  Remember - BRUSH YOUR TEETH THE MORNING OF SURGERY WITH YOUR REGULAR TOOTHPASTE  Please do not use if you have an allergy to CHG or antibacterial soaps. If your skin becomes reddened/irritated stop using the CHG.  Do not shave (including legs and underarms) for at least 48 hours prior to first CHG shower. It is OK to shave your face.  Please follow these instructions carefully.   1. Shower the NIGHT BEFORE SURGERY and the MORNING OF SURGERY  2. If you chose to wash your hair, wash your hair first as usual with your normal shampoo.  3. After you shampoo, rinse your hair and body thoroughly to remove the shampoo.  4. Use CHG Soap as you would any other liquid soap. You can apply CHG directly to the skin and  wash gently with a scrungie or a clean washcloth.   5. Apply the CHG Soap to your body ONLY FROM THE NECK DOWN.  Do not use on open wounds or open sores. Avoid contact with your eyes, ears, mouth and genitals (private parts). Wash Face and genitals (private parts)  with your normal soap.   6. Wash thoroughly, paying special attention to the area where your surgery will be performed.  7. Thoroughly rinse your body with warm water from the neck down.  8. DO NOT shower/wash with your normal soap after using and rinsing off the CHG Soap.  9. Pat yourself dry with a CLEAN TOWEL.  10. Wear CLEAN PAJAMAS to bed the night before surgery  11. Place CLEAN SHEETS on your bed the night before  your surgery  12. DO NOT SLEEP WITH PETS.   Day of Surgery: Take a shower with CHG soap. Wear Clean/Comfortable clothing the morning of surgery Do not apply any deodorants/lotions.   Remember to brush your teeth WITH YOUR REGULAR TOOTHPASTE.   Please read over the following fact sheets that you were given.

## 2020-11-13 ENCOUNTER — Inpatient Hospital Stay (HOSPITAL_COMMUNITY)
Admission: RE | Admit: 2020-11-13 | Discharge: 2020-11-13 | Disposition: A | Discharge status: Home / Self Care | Payer: Medicare Other | Source: Ambulatory Visit

## 2020-11-14 ENCOUNTER — Encounter (HOSPITAL_COMMUNITY): Payer: Self-pay | Admitting: Vascular Surgery

## 2020-11-14 NOTE — Progress Notes (Signed)
Anesthesia Chart Review: SAME DAY WORK-UP (no show for 11/13/20 PAT visit)   Case: [id number] Date/Time: 11/15/20 1112   Procedure: Cervical 3-4 Cervical 4-5 Cervical 5-6 Anterior cervical decompression/discectomy/fusion/interbody prosthesis/plate/screws (N/A )   Anesthesia type: General   Pre-op diagnosis: Cervical spondylosis with myelopathy and radiculopathy   Location: [facility] ROOM 19 / [facility] OR   Surgeons: Tressie Stalker, MD      DISCUSSION: Patient is a 60 year old female is scheduled for the above procedure.  History includes smoking, COPD, CAD (MI, s/p LCX stent 01/2000), CVA (1992), PE (1999), hiatal hernia, GERD, ulcerative colitis, COVID-19 (05/2018).  - Admission [facility] 08/22/20-08/24/20 for hypoxia with COPD exacerbation. Presented with 4 day history of cough and SOB that had progressively worsened. Found to be "hypoxic with elevated D-dimer 0.94, PO2 54, PCO2 40. Respiratory viral panel negative". + COVID vaccine by notes. CXR showed left basilar atelectasis/infiltrate. Follow-up CXR recommended for resolution to exclude underlying mass. CTA negative for PE. She was treated with O2, oral prednisone, doxycycline, and Duonebs. She was discharged on 2L/[place].  Notes indicate patient had quit smoking two months prior to admission.   Patient no showed for 11/13/20 PAT visit and staff unable to get answer when called 11/14/20 AM. By review of available records, she was last evaluated by cardiology on 03/25/19 and pulmonology on 10/05/19. It is possible she now has new providers, but if not, she is overdue for follow-up. In addition, in review of imaging in [facility]/PACS. It looks like she had a CXR on 11/14/20 that showed left pleural effusion with LLL airspace opacity, likely atelectasis and patchy pneumonia. I have notified Nikki at Dr. Lovell Sheehan office. She followed up with patient and learned patient just diagnosed with pneumonia, so surgery will be cancelled for 11/14/20. Dicussed with Lowella Bandy that that  prior to rescheduling an elective surgery, then would advise preoperative pulmonology and cardiology input.   VS: For day of surgery.   PROVIDERS: Selinda Flavin, MD is PCP  Chilton Greathouse, MD is pulmonologist. Last virtual visit 10/05/19 with Ames Dura, NP. PFTs showed mild obstructive lung disease. No significant BD response. Improvement on Breztri inhaler. Contiue Breztri and Singulair. 3 month follow-up planned. Dina Rich, MD is cardiologist. Last visit 03/25/19.   LABS: As of 08/23/20 Cr 0.72, glucose 145, AST 8, ALT 15, WBC 10.9, H/H 11.0/34.2, 313.   IMAGES: CXR 11/14/20 (report in [facility]/PACS): FINDINGS: There is a left pleural effusion with ill-defined airspace opacity in the left base. Lungs elsewhere clear. Heart size and pulmonary vascularity are normal. No adenopathy. There is aortic atherosclerosis. No bone lesions. IMPRESSION: Left pleural effusion with left lower lobe airspace opacity, likely representing a combination of atelectasis and patchy pneumonia. Lungs elsewhere clear. Heart size normal. No adenopathy. Aortic Atherosclerosis (ICD10-I70.0).  CTA Chest 08/22/20 [facility]): Impression: 1. No definite evidence of pulmonary embolus.  2. Coronary artery calcifications are noted suggesting coronary  artery disease.  3. Minimal bibasilar subsegmental atelectasis is noted.  4. Emphysema and aortic atherosclerosis.    CXR 08/22/20 [facility]; in setting of COPD/PNA admission): Impression: Left base atelectasis/infiltrate. Close follow-up chest x-rays  suggested to demonstrate resolution and to exclude underlying mass  lesion.   MRI C-spine 09/23/19 (report in [facility]/PACS): IMPRESSION: - Cervical spondylosis as outlined. - Moderate/advanced disc degeneration throughout much of the cervical spine. Multilevel degenerative endplate edema, greatest at C5-C6 and C7-T1. - At C3-C4, a prominent posterior disc osteophyte complex contributes to multifactorial  moderate to moderately severe spinal canal stenosis.  Contact upon the dorsal and ventral spinal cord. Severe bilateral neural foraminal narrowing. - No more than mild spinal canal stenosis at the remaining levels. - Multifactorial severe bilateral neural foraminal narrowing present at C4-C5 and C5-C6. Additional sites of foraminal stenosis as detailed.   EKG: EKG 08/22/20 [facility]): By result narrative: Normal sinus rhythm  Prolonged QT [QT 416 mg, QTc 467 ms] Abnormal ECG  No previous ECGs available  Confirmed by Romie Minus ([id number]) on 08/22/2020 5:26:57 PM    CV: Echo 08/31/08: SUMMARY  - Overall left ventricular systolic function was vigorous. Left     ventricular ejection fraction was estimated to be 65 %. There     was no diagnostic evidence of left ventricular regional wall     motion abnormalities.     Cardiac cath 08/31/08:  ANGIOGRAPHIC FINDINGS:  1. Left main coronary artery bifurcated into the circumflex and LAD      and had no significant disease noted.  2. The left anterior descending is a large vessel that courses to the      apex and gives rise to a first small diagonal branch, a normal      second diagonal branch, and a small third diagonal branch.  There      was a 20% stenosis noted in the midportion of the LAD.  3. The left circumflex is a relatively large codominant vessel that      gives rise to a large first marginal branch, a large second      marginal branch, and a small posterolateral branch.  There is a      stent in the mid to distal circumflex which extends into the second      marginal branch.  There is no significant in-stent stenosis noted.      There is a 30% stenosis noted prior to the stented segment in the      midportion of the circumflex.  4. The right coronary artery is a small to moderate size codominant      vessel that has serial 30% lesions in the midportion and gives rise      to a small posterior descending  artery.  5. Left ventricular angiogram was performed in the RAO projection and      showed normal left ventricular systolic function with no wall      motion abnormalities.  Ejection fraction was estimated at 55%.   HEMODYNAMIC DATA:  Central aortic pressure 84/50.  Left ventricular  pressure 87/5.  End-diastolic pressure 11.   IMPRESSION:  1. Noncardiac etiology of chest pain.  2. Stable single-vessel coronary artery disease with patent stented      segment in the mid to distal circumflex coronary artery.  3. Mild nonobstructive disease in the right coronary artery and the      left anterior descending coronary artery.  4. Normal left ventricular systolic function.   RECOMMENDATIONS:  I do not think that this patient's chest pain could be  explained by a cardiac etiology.  I would like to check an  echocardiogram to rule out any significant pericardial effusion...    Past Medical History:  Diagnosis Date  . Acid reflux   . CAD (coronary artery disease)    S/P cath with stent in 2001  . COPD (chronic obstructive pulmonary disease) (HCC)   . CVA (cerebral vascular accident) (HCC) 1992  . DU (duodenal ulcer) 09/19/09  . Hiatal hernia    EGD september 2007. normal except for small hiatal  hernia. She underwent small bowel biopsy with history of diarrhea at that time. This was negative  . History of colonoscopy    August 2007. She had a pedunculated polyp at 30 cm. which was hamartomatous polyp. She  is due for 5-year followup with family history of coln cancer in a brother at age 75  . History of colonoscopy    2005 revealed a linear ulcer with scar versus inflammatory process at the mid right colon, but normal terminal ileum. A biopsy of this area revealed an ulceration, but nonspecific  . History of coronary angiogram    CT angiogram in 2007. negative with normal mesenteric arteries  . History of kidney stones   . Migraine   . Pulmonary embolus (HCC) 1999  . Ulcerative colitis  (HCC)   . Urinary frequency     Past Surgical History:  Procedure Laterality Date  . ABDOMINAL HYSTERECTOMY    . APPENDECTOMY    . CESAREAN SECTION    . CHOLECYSTECTOMY    . COLONOSCOPY  01/2006   Rourk-pedunculated polyp 30 cm hamartomatous polyp,  . COLONOSCOPY  2005   Lineal ulcer or mid right colon, normal TI nonspecific biopsy  . CORONARY ANGIOPLASTY WITH STENT PLACEMENT  2011   Dr. Corinda Gubler, [place]  . ESOPHAGOGASTRODUODENOSCOPY  09/19/09   Rourk -2 duodenal bulbar ulcers, small HH otherwise normal/tiny distal esophageal erosions with mild erosive reflux  . ESOPHAGOGASTRODUODENOSCOPY  03/2006   Rourk-Hiatal hernia, negative small bowel biopsy  . TONSILLECTOMY    . Unilateral oophorectomy with hysterectomy      MEDICATIONS: No current facility-administered medications for this encounter.   Marland Kitchen albuterol (PROVENTIL HFA;VENTOLIN HFA) 108 (90 BASE) MCG/ACT inhaler  . albuterol (PROVENTIL) (2.5 MG/3ML) 0.083% nebulizer solution  . buprenorphine (SUBUTEX) 8 MG SUBL SL tablet  . Fluticasone-Salmeterol (ADVAIR) 250-50 MCG/DOSE AEPB  . gabapentin (NEURONTIN) 300 MG capsule  . hydrOXYzine (ATARAX/VISTARIL) 50 MG tablet  . mirtazapine (REMERON) 30 MG tablet  . montelukast (SINGULAIR) 10 MG tablet  . Omeprazole 20 MG TBEC  . QUEtiapine (SEROQUEL) 100 MG tablet  . sertraline (ZOLOFT) 50 MG tablet  . tiZANidine (ZANAFLEX) 4 MG tablet  . traZODone (DESYREL) 100 MG tablet  . Budeson-Glycopyrrol-Formoterol (BREZTRI AEROSPHERE) 160-9-4.8 MCG/ACT AERO  . guaiFENesin (MUCINEX) 600 MG 12 hr tablet  . ipratropium-albuterol (DUONEB) 0.5-2.5 (3) MG/3ML SOLN    Shonna Chock, PA-C Surgical Short Stay/Anesthesiology [facility] Phone [phone] [facility] Phone [phone] 11/14/2020 2:34 PM

## 2020-11-15 ENCOUNTER — Encounter (HOSPITAL_COMMUNITY): Admission: RE | Payer: Self-pay | Source: Ambulatory Visit

## 2020-11-15 ENCOUNTER — Ambulatory Visit (HOSPITAL_COMMUNITY): Admission: RE | Admit: 2020-11-15 | Payer: Medicare Other | Source: Ambulatory Visit | Admitting: Neurosurgery

## 2020-11-15 SURGERY — ANTERIOR CERVICAL DECOMPRESSION/DISCECTOMY FUSION 3 LEVELS
Anesthesia: General

## 2021-04-10 ENCOUNTER — Encounter: Payer: Self-pay | Admitting: Family Medicine

## 2021-04-10 ENCOUNTER — Ambulatory Visit (INDEPENDENT_AMBULATORY_CARE_PROVIDER_SITE_OTHER): Payer: Medicare Other | Admitting: Family Medicine

## 2021-04-10 ENCOUNTER — Telehealth: Payer: Self-pay | Admitting: Pulmonary Disease

## 2021-04-10 ENCOUNTER — Encounter: Payer: Self-pay | Admitting: *Deleted

## 2021-04-10 VITALS — BP 98/60 | HR 83 | Ht 64.0 in | Wt 130.0 lb

## 2021-04-10 DIAGNOSIS — R739 Hyperglycemia, unspecified: Secondary | ICD-10-CM | POA: Diagnosis not present

## 2021-04-10 DIAGNOSIS — I2510 Atherosclerotic heart disease of native coronary artery without angina pectoris: Secondary | ICD-10-CM

## 2021-04-10 DIAGNOSIS — Z720 Tobacco use: Secondary | ICD-10-CM

## 2021-04-10 DIAGNOSIS — R079 Chest pain, unspecified: Secondary | ICD-10-CM

## 2021-04-10 DIAGNOSIS — J449 Chronic obstructive pulmonary disease, unspecified: Secondary | ICD-10-CM | POA: Diagnosis not present

## 2021-04-10 DIAGNOSIS — R0602 Shortness of breath: Secondary | ICD-10-CM

## 2021-04-10 MED ORDER — BREZTRI AEROSPHERE 160-9-4.8 MCG/ACT IN AERO
2.0000 | INHALATION_SPRAY | Freq: Two times a day (BID) | RESPIRATORY_TRACT | 3 refills | Status: DC
Start: 2021-04-10 — End: 2021-04-19

## 2021-04-10 MED ORDER — IPRATROPIUM-ALBUTEROL 0.5-2.5 (3) MG/3ML IN SOLN: 3.0000 mL | RESPIRATORY_TRACT | 11 refills | Status: DC | PRN

## 2021-04-10 NOTE — Progress Notes (Signed)
Cardiology Office Note  Date: 04/10/2021   ID: [name] [name], DOB 22-Sep-1960, MRN [id number]  PCP:  Lianne Moris, PA-C  Cardiologist:  Dina Rich, MD Electrophysiologist:  None   Chief Complaint: ED follow-up [facility]  History of Present Illness: [name] [name] is a 60 y.o. female with a history of CAD, COPD, CVA, hiatal hernia, PE.  On 04/01/2021 she presented to [facility] ED with altered mental status of uncertain etiology.  She was reported to have an upper respiratory infection over the prior few days was getting worse.  History of COPD.  She did not have any pain other than chronic back pain.  She took 2 Percocet given to her by her friend the prior day as her back pain had been worse than usual.  Her altered mental status resolved likely secondary to hypoxia from COPD exacerbation with respiratory failure.  Chest x-ray was negative, chest CT was negative.  She had significant leukocytosis with left shift.  She had lactic acidosis and slightly elevated anion.  She was given naloxone in the ER with no appreciable effect.  Given time and oxygen, patient's mental status did improve.  Case was discussed with hospitalist who requested ABG and LP for admission.  Upon discussion with patient she declined all further evaluation and decided to check out AMA.  Her respiratory panel was negative, WBC count was 18.2, lactate was 4.9, anion gap 12, glucose 305, urine drug screen was positive for benzodiazepines and oxycodone, point-of-care glucose was 310, CT of the chest showed aortic atherosclerosis and emphysema with coronary artery calcifications.  She is here for follow-up today.  She recently saw her PCP and had lab work done.  She states her cholesterol was elevated and she was started on atorvastatin 40 mg.  She states she was also told she was prediabetic.  She has an upcoming follow-up with her PCP.  She states that she has been having chest pain with exertion and  relieved at rest.  She has a long history of smoking and continues to smoke.  She is on home O2.  She has nebulizer treatment and metered-dose inhalers at home.  States she has significant issues with shortness of breath.  Worse with exertion.  States she has degenerative disc disease and her body aches all over.  She states she has seen pulmonary in the past but is been quite sometime.   Past Medical History:  Diagnosis Date   Acid reflux    CAD (coronary artery disease)    S/P cath with stent in 2001   COPD (chronic obstructive pulmonary disease) (HCC)    CVA (cerebral vascular accident) (HCC) 1992   DU (duodenal ulcer) 09/19/09   Hiatal hernia    EGD september 2007. normal except for small hiatal hernia. She underwent small bowel biopsy with history of diarrhea at that time. This was negative   History of colonoscopy    August 2007. She had a pedunculated polyp at 30 cm. which was hamartomatous polyp. She  is due for 5-year followup with family history of coln cancer in a brother at age 46   History of colonoscopy    2005 revealed a linear ulcer with scar versus inflammatory process at the mid right colon, but normal terminal ileum. A biopsy of this area revealed an ulceration, but nonspecific   History of coronary angiogram    CT angiogram in 2007. negative with normal mesenteric arteries   History of kidney stones  Migraine    Pulmonary embolus (HCC) 1999   Ulcerative colitis (HCC)    Urinary frequency     Past Surgical History:  Procedure Laterality Date   ABDOMINAL HYSTERECTOMY     APPENDECTOMY     CESAREAN SECTION     CHOLECYSTECTOMY     COLONOSCOPY  01/2006   Rourk-pedunculated polyp 30 cm hamartomatous polyp,   COLONOSCOPY  2005   Lineal ulcer or mid right colon, normal TI nonspecific biopsy   CORONARY ANGIOPLASTY WITH STENT PLACEMENT  2011   Dr. Corinda Gubler, [place]   ESOPHAGOGASTRODUODENOSCOPY  09/19/09   Rourk -2 duodenal bulbar ulcers, small HH otherwise normal/tiny  distal esophageal erosions with mild erosive reflux   ESOPHAGOGASTRODUODENOSCOPY  03/2006   Rourk-Hiatal hernia, negative small bowel biopsy   TONSILLECTOMY     Unilateral oophorectomy with hysterectomy      Current Outpatient Medications  Medication Sig Dispense Refill   albuterol (PROVENTIL HFA;VENTOLIN HFA) 108 (90 BASE) MCG/ACT inhaler Inhale 2 puffs into the lungs every 6 (six) hours as needed for shortness of breath.      albuterol (PROVENTIL) (2.5 MG/3ML) 0.083% nebulizer solution Take 2.5 mg by nebulization every 6 (six) hours as needed for wheezing or shortness of breath.     buprenorphine (SUBUTEX) 8 MG SUBL SL tablet Place 8 mg under the tongue in the morning, at noon, and at bedtime.     fluticasone (FLONASE) 50 MCG/ACT nasal spray Place 1 spray into both nostrils as needed for allergies or rhinitis.     Fluticasone-Salmeterol (ADVAIR) 250-50 MCG/DOSE AEPB Inhale 1 puff into the lungs 2 (two) times daily.     gabapentin (NEURONTIN) 300 MG capsule Take 600-1,200 mg by mouth See admin instructions. Take 600 mg by mouth four times daily and take 1200 mg at bedtime     guaiFENesin (MUCINEX) 600 MG 12 hr tablet Take 1 tablet (600 mg total) by mouth 2 (two) times daily. 12 tablet 0   hydrOXYzine (ATARAX/VISTARIL) 50 MG tablet Take 50 mg by mouth in the morning, at noon, and at bedtime.     mirtazapine (REMERON) 30 MG tablet Take 30 mg by mouth at bedtime.     montelukast (SINGULAIR) 10 MG tablet Take 1 tablet (10 mg total) by mouth at bedtime. 30 tablet 6   Omeprazole 20 MG TBEC Take 20 mg by mouth daily as needed (acid reflux).     QUEtiapine (SEROQUEL) 100 MG tablet Take 100 mg by mouth at bedtime.     sertraline (ZOLOFT) 50 MG tablet Take 50 mg by mouth daily.     tiZANidine (ZANAFLEX) 4 MG tablet Take 4 mg by mouth every 8 (eight) hours as needed for muscle spasms.     traZODone (DESYREL) 100 MG tablet Take 300 mg by mouth at bedtime.     Budeson-Glycopyrrol-Formoterol (BREZTRI  AEROSPHERE) 160-9-4.8 MCG/ACT AERO Inhale 2 puffs into the lungs 2 (two) times daily. (Patient not taking: No sig reported) 10.7 g 3   ipratropium-albuterol (DUONEB) 0.5-2.5 (3) MG/3ML SOLN Take 3 mLs by nebulization every 4 (four) hours as needed. (Patient not taking: No sig reported) 360 mL 11   No current facility-administered medications for this visit.   Allergies:  Codeine, Hydrocodone, Sulfa antibiotics, and Hydrocodone-acetaminophen   Social History: The patient  reports that she has been smoking cigarettes. She has a 35.00 pack-year smoking history. She has never used smokeless tobacco. She reports that she does not drink alcohol and does not use drugs.   Family History:  The patient's family history includes Colon cancer (age of onset: 81) in her brother; Diabetes in her sister; Heart attack (age of onset: 20) in her mother.   ROS:  Please see the history of present illness. Otherwise, complete review of systems is positive for none.  All other systems are reviewed and negative.   Physical Exam: VS:  BP 98/60   Pulse 83   Ht 5\' 4"  (1.626 m)   Wt 130 lb (59 kg)   SpO2 97%   BMI 22.31 kg/m , BMI Body mass index is 22.31 kg/m.  Wt Readings from Last 3 Encounters:  04/10/21 130 lb (59 kg)  07/28/19 118 lb 9.6 oz (53.8 kg)  03/25/19 105 lb 12.8 oz (48 kg)    General: Patient appears comfortable at rest. Neck: Supple, no elevated JVP or carotid bruits, no thyromegaly. Lungs: Clear to auscultation, nonlabored breathing at rest. Cardiac: Regular rate and rhythm, no S3 or significant systolic murmur, no pericardial rub. Extremities: No pitting edema, distal pulses 2+. Skin: Warm and dry. Musculoskeletal: No kyphosis. Neuropsychiatric: Alert and oriented x3, affect grossly appropriate.  ECG:    Recent Labwork: No results found for requested labs within last 8760 hours.     Component Value Date/Time   CHOL (H) 08/31/2008 0215    204        ATP III CLASSIFICATION:  <200      mg/dL   Desirable  10/31/2008  mg/dL   Borderline High  174-081    mg/dL   High          TRIG >=448 08/31/2008 0215   HDL 36 (L) 08/31/2008 0215   CHOLHDL 5.7 08/31/2008 0215   VLDL 21 08/31/2008 0215   LDLCALC (H) 08/31/2008 0215    147        Total Cholesterol/HDL:CHD Risk Coronary Heart Disease Risk Table                     Men   Women  1/2 Average Risk   3.4   3.3  Average Risk       5.0   4.4  2 X Average Risk   9.6   7.1  3 X Average Risk  23.4   11.0        Use the calculated Patient Ratio above and the CHD Risk Table to determine the patient's CHD Risk.        ATP III CLASSIFICATION (LDL):  <100     mg/dL   Optimal  10/31/2008  mg/dL   Near or Above                    Optimal  130-159  mg/dL   Borderline  631-497  mg/dL   High  026-378     mg/dL   Very High    Other Studies Reviewed Today:  CT CTA chest with contrast 04/01/2021 CT Chest W Contrast  Anatomical Region Laterality Modality  Chest -- Computed Tomography   Impression  1. No evidence for acute abnormality of the chest.  2. Aortic Atherosclerosis (ICD10-I70.0) and Emphysema (ICD10-J43.9).  3. Coronary artery calcifications.    Assessment and Plan:  1. CAD in native artery   2. Tobacco abuse   3. Chronic obstructive pulmonary disease, unspecified COPD type (HCC)   4. Elevated blood sugar    1. CAD in native artery History of CAD with previous stents.  Complaining of chest pain worse with exertional activity and relieved with  rest.  Please get a Lexiscan stress test.  Suggested starting aspirin.  Patient states she cannot take aspirin due to stomach upset.  She was recently started on atorvastatin 40 mg by PCP due to elevated lipids.  Lipids will be followed by PCP.  2. Tobacco abuse Long history of tobacco abuse and continues to smoke.  Increasing shortness of breath.  Recent CTA shows emphysema.  History of COPD on home oxygen, nebulizer treatments and metered-dose inhalers  3. Chronic obstructive  pulmonary disease, unspecified COPD type (HCC)  History of COPD on home oxygen, nebulizer treatments and metered-dose inhalers.  She states she has seen pulmonary in the past but it has been quite sometime.  Please refer to Dr. Vassie Loll in [place] for reevaluation of COPD/emphysema/history of smoking.  4. Elevated blood sugar Recently had elevated blood sugar at [facility] ED visit of 305.  She states she recently had blood work at PCP office and was told she was prediabetic.  She has an upcoming follow-up with PCP  5. SOB (shortness of breath) Complaining of increasing shortness of breath on minimal activity.  Please get an echocardiogram to assess LV function, diastolic function and valvular function.    6. Chest pain, unspecified type Complaining of chest pain with activity and relieved at rest.  Please get a Lexiscan stress test to rule out ischemic etiology.  Recent evidence of coronary artery calcifications on CT angiogram at [facility].  Medication Adjustments/Labs and Tests Ordered: Current medicines are reviewed at length with the patient today.  Concerns regarding medicines are outlined above.   Disposition: Follow-up with Dr. Wyline Mood or APP 2 to 3 months  Signed, Rennis Harding, NP 04/10/2021 8:45 AM    [facility] [address] [place], [place], [place] [address] Phone: [phone]; Fax: [phone]

## 2021-04-10 NOTE — Patient Instructions (Signed)
Medication Instructions:  Continue all current medications.  Labwork: none  Testing/Procedures: Your physician has requested that you have an echocardiogram. Echocardiography is a painless test that uses sound waves to create images of your heart. It provides your doctor with information about the size and shape of your heart and how well your heart's chambers and valves are working. This procedure takes approximately one hour. There are no restrictions for this procedure. Your physician has requested that you have a lexiscan myoview. For further information please visit [email or web address] Please follow instruction sheet, as given. Office will contact with results via phone or letter.     Follow-Up: 2-3 months   Any Other Special Instructions Will Be Listed Below (If Applicable). You have been referred to:  Pulmonology   If you need a refill on your cardiac medications before your next appointment, please call your pharmacy.

## 2021-04-10 NOTE — Telephone Encounter (Signed)
This is fine to schedule with any [place] provider  When changing due to location, no need to get permission from provider Thanks

## 2021-04-16 ENCOUNTER — Other Ambulatory Visit: Payer: Self-pay

## 2021-04-16 ENCOUNTER — Ambulatory Visit (HOSPITAL_COMMUNITY)
Admission: RE | Admit: 2021-04-16 | Discharge: 2021-04-16 | Disposition: A | Discharge status: Home / Self Care | Payer: Medicare Other | Source: Ambulatory Visit | Attending: Family Medicine | Admitting: Family Medicine

## 2021-04-16 ENCOUNTER — Encounter (HOSPITAL_COMMUNITY): Payer: Self-pay

## 2021-04-16 ENCOUNTER — Observation Stay (HOSPITAL_COMMUNITY)
Admission: EM | Admit: 2021-04-16 | Discharge: 2021-04-19 | Disposition: A | Discharge status: Home / Self Care | Payer: Medicare Other | Attending: Internal Medicine | Admitting: Internal Medicine

## 2021-04-16 ENCOUNTER — Emergency Department (HOSPITAL_COMMUNITY): Payer: Medicare Other

## 2021-04-16 ENCOUNTER — Encounter (HOSPITAL_COMMUNITY)
Admission: RE | Admit: 2021-04-16 | Discharge: 2021-04-16 | Disposition: A | Discharge status: Home / Self Care | Payer: Medicare Other | Source: Ambulatory Visit | Attending: Family Medicine | Admitting: Family Medicine

## 2021-04-16 ENCOUNTER — Encounter (HOSPITAL_COMMUNITY): Payer: Self-pay | Admitting: Emergency Medicine

## 2021-04-16 DIAGNOSIS — Z955 Presence of coronary angioplasty implant and graft: Secondary | ICD-10-CM | POA: Diagnosis not present

## 2021-04-16 DIAGNOSIS — I2510 Atherosclerotic heart disease of native coronary artery without angina pectoris: Secondary | ICD-10-CM

## 2021-04-16 DIAGNOSIS — I2583 Coronary atherosclerosis due to lipid rich plaque: Secondary | ICD-10-CM | POA: Diagnosis not present

## 2021-04-16 DIAGNOSIS — I25110 Atherosclerotic heart disease of native coronary artery with unstable angina pectoris: Secondary | ICD-10-CM | POA: Diagnosis not present

## 2021-04-16 DIAGNOSIS — R079 Chest pain, unspecified: Secondary | ICD-10-CM | POA: Diagnosis not present

## 2021-04-16 DIAGNOSIS — J441 Chronic obstructive pulmonary disease with (acute) exacerbation: Secondary | ICD-10-CM | POA: Diagnosis not present

## 2021-04-16 DIAGNOSIS — I10 Essential (primary) hypertension: Secondary | ICD-10-CM | POA: Diagnosis not present

## 2021-04-16 DIAGNOSIS — R072 Precordial pain: Secondary | ICD-10-CM | POA: Diagnosis not present

## 2021-04-16 DIAGNOSIS — J449 Chronic obstructive pulmonary disease, unspecified: Secondary | ICD-10-CM

## 2021-04-16 DIAGNOSIS — Z20822 Contact with and (suspected) exposure to covid-19: Secondary | ICD-10-CM | POA: Insufficient documentation

## 2021-04-16 DIAGNOSIS — F17210 Nicotine dependence, cigarettes, uncomplicated: Secondary | ICD-10-CM | POA: Diagnosis not present

## 2021-04-16 DIAGNOSIS — Z79899 Other long term (current) drug therapy: Secondary | ICD-10-CM | POA: Insufficient documentation

## 2021-04-16 DIAGNOSIS — I200 Unstable angina: Secondary | ICD-10-CM

## 2021-04-16 HISTORY — DX: Systemic involvement of connective tissue, unspecified: M35.9

## 2021-04-16 HISTORY — DX: Heart failure, unspecified: I50.9

## 2021-04-16 LAB — CBC WITH DIFFERENTIAL/PLATELET
Abs Immature Granulocytes: 0.11 10*3/uL — ABNORMAL HIGH (ref 0.00–0.07)
Basophils Absolute: 0.1 10*3/uL (ref 0.0–0.1)
Basophils Relative: 0 %
Eosinophils Absolute: 0.3 10*3/uL (ref 0.0–0.5)
Eosinophils Relative: 2 %
HCT: 41.4 % (ref 36.0–46.0)
Hemoglobin: 13.8 g/dL (ref 12.0–15.0)
Immature Granulocytes: 1 %
Lymphocytes Relative: 16 %
Lymphs Abs: 2.7 10*3/uL (ref 0.7–4.0)
MCH: 29.1 pg (ref 26.0–34.0)
MCHC: 33.3 g/dL (ref 30.0–36.0)
MCV: 87.2 fL (ref 80.0–100.0)
Monocytes Absolute: 0.9 10*3/uL (ref 0.1–1.0)
Monocytes Relative: 5 %
Neutro Abs: 12.3 10*3/uL — ABNORMAL HIGH (ref 1.7–7.7)
Neutrophils Relative %: 76 %
Platelets: 345 10*3/uL (ref 150–400)
RBC: 4.75 MIL/uL (ref 3.87–5.11)
RDW: 13.8 % (ref 11.5–15.5)
WBC: 16.4 10*3/uL — ABNORMAL HIGH (ref 4.0–10.5)
nRBC: 0 % (ref 0.0–0.2)

## 2021-04-16 LAB — BASIC METABOLIC PANEL
Anion gap: 7 (ref 5–15)
BUN: 10 mg/dL (ref 8–23)
CO2: 27 mmol/L (ref 22–32)
Calcium: 9 mg/dL (ref 8.9–10.3)
Chloride: 103 mmol/L (ref 98–111)
Creatinine, Ser: 0.64 mg/dL (ref 0.44–1.00)
GFR, Estimated: >60 mL/min (ref >60)
Glucose, Bld: 100 mg/dL — ABNORMAL HIGH (ref 70–99)
Potassium: 3.6 mmol/L (ref 3.5–5.1)
Sodium: 137 mmol/L (ref 135–145)

## 2021-04-16 LAB — URINALYSIS, ROUTINE W REFLEX MICROSCOPIC
Bacteria, UA: NONE SEEN
Bilirubin Urine: NEGATIVE
Glucose, UA: NEGATIVE mg/dL
Ketones, ur: NEGATIVE mg/dL
Leukocytes,Ua: NEGATIVE
Nitrite: NEGATIVE
Protein, ur: NEGATIVE mg/dL
Specific Gravity, Urine: 1.009 (ref 1.005–1.030)
pH: 8 (ref 5.0–8.0)

## 2021-04-16 LAB — TROPONIN I (HIGH SENSITIVITY)
Troponin I (High Sensitivity): 3 ng/L (ref <18)
Troponin I (High Sensitivity): 2 ng/L (ref <18)

## 2021-04-16 LAB — RESP PANEL BY RT-PCR (FLU A&B, COVID) ARPGX2
Influenza A by PCR: NEGATIVE
Influenza B by PCR: NEGATIVE
SARS Coronavirus 2 by RT PCR: NEGATIVE

## 2021-04-16 LAB — D-DIMER, QUANTITATIVE: D-Dimer, Quant: 0.92 ug/mL-FEU — ABNORMAL HIGH (ref 0.00–0.50)

## 2021-04-16 MED ORDER — BUPRENORPHINE HCL 2 MG SL SUBL
8.0000 mg | SUBLINGUAL_TABLET | Freq: Three times a day (TID) | SUBLINGUAL | Status: DC
  Administered 2021-04-16 – 2021-04-19 (×9): 8 mg via SUBLINGUAL
  Filled 2021-04-16 (×9): qty 4

## 2021-04-16 MED ORDER — TIZANIDINE HCL 4 MG PO TABS
4.0000 mg | ORAL_TABLET | Freq: Three times a day (TID) | ORAL | Status: DC | PRN
  Administered 2021-04-16 – 2021-04-17 (×2): 4 mg via ORAL
  Filled 2021-04-16 (×3): qty 1

## 2021-04-16 MED ORDER — NITROGLYCERIN 0.4 MG SL SUBL
0.4000 mg | SUBLINGUAL_TABLET | Freq: Once | SUBLINGUAL | Status: AC
  Administered 2021-04-16: 0.4 mg via SUBLINGUAL
  Filled 2021-04-16: qty 1

## 2021-04-16 MED ORDER — REGADENOSON 0.4 MG/5ML IV SOLN
0.4000 mg | Freq: Once | INTRAVENOUS | Status: DC
  Filled 2021-04-16 (×2): qty 5

## 2021-04-16 MED ORDER — FENTANYL CITRATE PF 50 MCG/ML IJ SOSY
50.0000 ug | PREFILLED_SYRINGE | Freq: Once | INTRAMUSCULAR | Status: AC
  Administered 2021-04-16: 50 ug via INTRAVENOUS
  Filled 2021-04-16: qty 1

## 2021-04-16 MED ORDER — AZITHROMYCIN 250 MG PO TABS
500.0000 mg | ORAL_TABLET | Freq: Every day | ORAL | Status: DC
  Administered 2021-04-17 – 2021-04-19 (×3): 500 mg via ORAL
  Filled 2021-04-16 (×3): qty 2

## 2021-04-16 MED ORDER — GABAPENTIN 400 MG PO CAPS
1200.0000 mg | ORAL_CAPSULE | Freq: Every day | ORAL | Status: DC
  Administered 2021-04-16 – 2021-04-18 (×3): 1200 mg via ORAL
  Filled 2021-04-16 (×4): qty 3

## 2021-04-16 MED ORDER — GABAPENTIN 300 MG PO CAPS: 600.0000 mg | ORAL_CAPSULE | ORAL | Status: DC

## 2021-04-16 MED ORDER — NICOTINE 21 MG/24HR TD PT24
21.0000 mg | MEDICATED_PATCH | Freq: Once | TRANSDERMAL | Status: AC
  Administered 2021-04-16: 21 mg via TRANSDERMAL
  Filled 2021-04-16: qty 1

## 2021-04-16 MED ORDER — LORAZEPAM 2 MG/ML IJ SOLN
0.5000 mg | Freq: Once | INTRAMUSCULAR | Status: AC
  Administered 2021-04-16: 0.5 mg via INTRAVENOUS
  Filled 2021-04-16: qty 1

## 2021-04-16 MED ORDER — ONDANSETRON HCL 4 MG/2ML IJ SOLN: 4.0000 mg | Freq: Four times a day (QID) | INTRAMUSCULAR | Status: DC | PRN

## 2021-04-16 MED ORDER — IPRATROPIUM-ALBUTEROL 0.5-2.5 (3) MG/3ML IN SOLN
3.0000 mL | Freq: Two times a day (BID) | RESPIRATORY_TRACT | Status: DC
  Administered 2021-04-17 – 2021-04-19 (×5): 3 mL via RESPIRATORY_TRACT
  Filled 2021-04-16 (×5): qty 3

## 2021-04-16 MED ORDER — QUETIAPINE FUMARATE 50 MG PO TABS
100.0000 mg | ORAL_TABLET | Freq: Every day | ORAL | Status: DC
  Administered 2021-04-16 – 2021-04-18 (×3): 100 mg via ORAL
  Filled 2021-04-16: qty 1
  Filled 2021-04-16: qty 2
  Filled 2021-04-16: qty 1

## 2021-04-16 MED ORDER — ALBUTEROL SULFATE (2.5 MG/3ML) 0.083% IN NEBU: 2.5000 mg | INHALATION_SOLUTION | Freq: Four times a day (QID) | RESPIRATORY_TRACT | Status: DC | PRN

## 2021-04-16 MED ORDER — ALBUTEROL SULFATE HFA 108 (90 BASE) MCG/ACT IN AERS: 2.0000 | INHALATION_SPRAY | Freq: Four times a day (QID) | RESPIRATORY_TRACT | Status: DC | PRN

## 2021-04-16 MED ORDER — ENOXAPARIN SODIUM 40 MG/0.4ML IJ SOSY
40.0000 mg | PREFILLED_SYRINGE | INTRAMUSCULAR | Status: DC
  Administered 2021-04-16 – 2021-04-18 (×3): 40 mg via SUBCUTANEOUS
  Filled 2021-04-16 (×3): qty 0.4

## 2021-04-16 MED ORDER — IPRATROPIUM-ALBUTEROL 0.5-2.5 (3) MG/3ML IN SOLN
3.0000 mL | Freq: Four times a day (QID) | RESPIRATORY_TRACT | Status: DC
  Administered 2021-04-16 (×2): 3 mL via RESPIRATORY_TRACT
  Filled 2021-04-16 (×2): qty 3

## 2021-04-16 MED ORDER — MOMETASONE FURO-FORMOTEROL FUM 200-5 MCG/ACT IN AERO
2.0000 | INHALATION_SPRAY | Freq: Two times a day (BID) | RESPIRATORY_TRACT | Status: DC
  Administered 2021-04-16 – 2021-04-18 (×4): 2 via RESPIRATORY_TRACT
  Filled 2021-04-16 (×2): qty 8.8

## 2021-04-16 MED ORDER — ATORVASTATIN CALCIUM 40 MG PO TABS
40.0000 mg | ORAL_TABLET | Freq: Every day | ORAL | Status: DC
  Administered 2021-04-17 – 2021-04-19 (×3): 40 mg via ORAL
  Filled 2021-04-16 (×3): qty 1

## 2021-04-16 MED ORDER — TRAZODONE HCL 100 MG PO TABS
300.0000 mg | ORAL_TABLET | Freq: Every day | ORAL | Status: DC
  Administered 2021-04-16 – 2021-04-18 (×3): 300 mg via ORAL
  Filled 2021-04-16: qty 6
  Filled 2021-04-16: qty 3
  Filled 2021-04-16: qty 6

## 2021-04-16 MED ORDER — PANTOPRAZOLE SODIUM 40 MG PO TBEC
40.0000 mg | DELAYED_RELEASE_TABLET | Freq: Every day | ORAL | Status: DC
  Administered 2021-04-16 – 2021-04-19 (×4): 40 mg via ORAL
  Filled 2021-04-16 (×4): qty 1

## 2021-04-16 MED ORDER — MORPHINE SULFATE (PF) 4 MG/ML IV SOLN
4.0000 mg | Freq: Once | INTRAVENOUS | Status: AC
  Administered 2021-04-16: 4 mg via INTRAVENOUS
  Filled 2021-04-16: qty 1

## 2021-04-16 MED ORDER — METHYLPREDNISOLONE SODIUM SUCC 40 MG IJ SOLR
40.0000 mg | Freq: Two times a day (BID) | INTRAMUSCULAR | Status: DC
  Administered 2021-04-17 – 2021-04-18 (×3): 40 mg via INTRAVENOUS
  Filled 2021-04-16 (×3): qty 1

## 2021-04-16 MED ORDER — AZITHROMYCIN 250 MG PO TABS: 500.0000 mg | ORAL_TABLET | Freq: Every day | ORAL | Status: DC

## 2021-04-16 MED ORDER — GABAPENTIN 300 MG PO CAPS
600.0000 mg | ORAL_CAPSULE | Freq: Three times a day (TID) | ORAL | Status: DC
  Administered 2021-04-16 – 2021-04-19 (×7): 600 mg via ORAL
  Filled 2021-04-16 (×4): qty 2
  Filled 2021-04-16: qty 6
  Filled 2021-04-16 (×2): qty 2

## 2021-04-16 MED ORDER — NICOTINE 21 MG/24HR TD PT24
21.0000 mg | MEDICATED_PATCH | Freq: Every day | TRANSDERMAL | Status: DC
  Administered 2021-04-17 – 2021-04-19 (×3): 21 mg via TRANSDERMAL
  Filled 2021-04-16 (×3): qty 1

## 2021-04-16 MED ORDER — DICLOFENAC SODIUM 1 % EX GEL
2.0000 g | Freq: Four times a day (QID) | CUTANEOUS | Status: DC
  Administered 2021-04-16 – 2021-04-17 (×5): 2 g via TOPICAL
  Filled 2021-04-16 (×2): qty 100

## 2021-04-16 MED ORDER — HYDROXYZINE HCL 25 MG PO TABS
50.0000 mg | ORAL_TABLET | Freq: Three times a day (TID) | ORAL | Status: DC | PRN
  Administered 2021-04-17 – 2021-04-19 (×3): 50 mg via ORAL
  Filled 2021-04-16 (×3): qty 2

## 2021-04-16 NOTE — Consult Note (Signed)
Cardiology Consultation:   Patient ID: [name] [name] MRN: [id number]; DOB: 10/09/1960  Admit date: 04/16/2021 Date of Consult: 04/16/2021  PCP:  Lianne Moris, PA-C   [facility] Providers Cardiologist:  Dina Rich, MD        Patient Profile:   [name] [name] is a 60 y.o. female with a hx of CAD who is being seen 04/16/2021 for the evaluation of chest pain at the request of Dr. Sharolyn Douglas.  History of Present Illness:   Ms. Hanaway is a 60 yo female patient with history of CAD stent Cfx 2001, patent on cath 2010-see below for details, COPD on home O2 still smoking.  Patient who was sent to ED from [facility] with chest pain, diaphoresis. She was scheduled for a lexiscan myoview today. She complains of constant chest pain and shortness of breath. Started using her O2 all day instead of just at night. Called EMS yest but refused to come in. Troponins negative, EKG unchanged.   Past Medical History:  Diagnosis Date   Acid reflux    CAD (coronary artery disease)    S/P cath with stent in 2001   COPD (chronic obstructive pulmonary disease) (HCC)    CVA (cerebral vascular accident) (HCC) 1992   DU (duodenal ulcer) 09/19/09   Hiatal hernia    EGD september 2007. normal except for small hiatal hernia. She underwent small bowel biopsy with history of diarrhea at that time. This was negative   History of colonoscopy    August 2007. She had a pedunculated polyp at 30 cm. which was hamartomatous polyp. She  is due for 5-year followup with family history of coln cancer in a brother at age 66   History of colonoscopy    2005 revealed a linear ulcer with scar versus inflammatory process at the mid right colon, but normal terminal ileum. A biopsy of this area revealed an ulceration, but nonspecific   History of coronary angiogram    CT angiogram in 2007. negative with normal mesenteric arteries   History of kidney stones    Migraine    Pulmonary embolus (HCC) 1999   Ulcerative  colitis (HCC)    Urinary frequency     Past Surgical History:  Procedure Laterality Date   ABDOMINAL HYSTERECTOMY     APPENDECTOMY     CESAREAN SECTION     CHOLECYSTECTOMY     COLONOSCOPY  01/2006   Rourk-pedunculated polyp 30 cm hamartomatous polyp,   COLONOSCOPY  2005   Lineal ulcer or mid right colon, normal TI nonspecific biopsy   CORONARY ANGIOPLASTY WITH STENT PLACEMENT  2011   Dr. Corinda Gubler, [place]   ESOPHAGOGASTRODUODENOSCOPY  09/19/09   Rourk -2 duodenal bulbar ulcers, small HH otherwise normal/tiny distal esophageal erosions with mild erosive reflux   ESOPHAGOGASTRODUODENOSCOPY  03/2006   Rourk-Hiatal hernia, negative small bowel biopsy   TONSILLECTOMY     Unilateral oophorectomy with hysterectomy       Home Medications:  Prior to Admission medications   Medication Sig Start Date End Date Taking? Authorizing Provider  albuterol (PROVENTIL HFA;VENTOLIN HFA) 108 (90 BASE) MCG/ACT inhaler Inhale 2 puffs into the lungs every 6 (six) hours as needed for shortness of breath.    Yes [provider]  atorvastatin (LIPITOR) 40 MG tablet Take 40 mg by mouth daily. 04/03/21  Yes [provider]  buprenorphine (SUBUTEX) 8 MG SUBL SL tablet Place 8 mg under the tongue in the morning, at noon, and at bedtime.   Yes [provider]  Fluticasone-Salmeterol (ADVAIR) 250-50 MCG/DOSE AEPB Inhale 1 puff into the lungs 2 (two) times daily. 09/28/20  Yes [provider]  gabapentin (NEURONTIN) 300 MG capsule Take 600-1,200 mg by mouth See admin instructions. Take 600 mg by mouth four times daily and take 1200 mg at bedtime 10/12/20  Yes [provider]  guaiFENesin (MUCINEX) 600 MG 12 hr tablet Take 1 tablet (600 mg total) by mouth 2 (two) times daily. 04/11/14  Yes Erick Blinks, MD  hydrOXYzine (ATARAX/VISTARIL) 50 MG tablet Take 50 mg by mouth in the morning, at noon, and at bedtime. 11/06/20  Yes [provider]  Omeprazole 20 MG TBEC Take 20 mg  by mouth daily as needed (acid reflux).   Yes [provider]  QUEtiapine (SEROQUEL) 100 MG tablet Take 100 mg by mouth at bedtime. 11/06/20  Yes [provider]  tiZANidine (ZANAFLEX) 4 MG tablet Take 4 mg by mouth every 8 (eight) hours as needed for muscle spasms. 10/03/20  Yes [provider]  traZODone (DESYREL) 100 MG tablet Take 300 mg by mouth at bedtime. 10/12/20  Yes [provider]  albuterol (PROVENTIL) (2.5 MG/3ML) 0.083% nebulizer solution Take 2.5 mg by nebulization every 6 (six) hours as needed for wheezing or shortness of breath. Patient not taking: Reported on 04/16/2021    [provider]  azithromycin (ZITHROMAX) 250 MG tablet Take by mouth. Patient not taking: Reported on 04/16/2021 04/10/21   [provider]  Budeson-Glycopyrrol-Formoterol (BREZTRI AEROSPHERE) 160-9-4.8 MCG/ACT AERO Inhale 2 puffs into the lungs 2 (two) times daily. (04/10/2021 - NOT TAKING) Patient not taking: No sig reported 04/10/21   Netta Neat., NP  ipratropium-albuterol (DUONEB) 0.5-2.5 (3) MG/3ML SOLN Take 3 mLs by nebulization every 4 (four) hours as needed. (04/10/2021 - NOT TAKING) Patient not taking: No sig reported 04/10/21   Netta Neat., NP  montelukast (SINGULAIR) 10 MG tablet Take 1 tablet (10 mg total) by mouth at bedtime. Patient not taking: Reported on 04/16/2021 10/05/19   Glenford Bayley, NP  predniSONE (DELTASONE) 20 MG tablet Take 40 mg by mouth daily. Patient not taking: Reported on 04/16/2021 04/10/21   [provider]  sertraline (ZOLOFT) 50 MG tablet Take 50 mg by mouth daily. Patient not taking: Reported on 04/16/2021 10/17/20   [provider]    Inpatient Medications: Scheduled Meds:  [START ON 04/17/2021] atorvastatin  40 mg Oral Daily   buprenorphine  8 mg Sublingual TID   gabapentin  1,200 mg Oral QHS   gabapentin  600 mg Oral TID with meals   mometasone-formoterol  2 puff Inhalation BID    nicotine  21 mg Transdermal Once   pantoprazole  40 mg Oral Daily   QUEtiapine  100 mg Oral QHS   regadenoson  0.4 mg Intravenous Once   traZODone  300 mg Oral QHS   Continuous Infusions:  PRN Meds: albuterol, hydrOXYzine, tiZANidine  Allergies:    Allergies  Allergen Reactions   Codeine    Hydrocodone    Sulfa Antibiotics    Hydrocodone-Acetaminophen Hives, Nausea And Vomiting and Anxiety    Social History:   Social History   Socioeconomic History   Marital status: Married    Spouse name: Not on file   Number of children: 2   Years of education: Not on file   Highest education level: Not on file  Occupational History   Occupation: [employer]  Tobacco Use   Smoking status: Every  Day    Packs/day: 1.00    Years: 35.00    Pack years: 35.00    Types: Cigarettes   Smokeless tobacco: Never   Tobacco comments:    1/3 of a pack 07/28/19  Vaping Use   Vaping Use: Never used  Substance and Sexual Activity   Alcohol use: No   Drug use: No   Sexual activity: Not on file  Other Topics Concern   Not on file  Social History Narrative   Not on file   Social Determinants of Health   Financial Resource Strain: Not on file  Food Insecurity: Not on file  Transportation Needs: Not on file  Physical Activity: Not on file  Stress: Not on file  Social Connections: Not on file  Intimate Partner Violence: Not on file    Family History:     Family History  Problem Relation Age of Onset   Colon cancer Brother 39       died 1 year later   Heart attack Mother 31   Diabetes Sister      ROS:  Please see the history of present illness.  Review of Systems  Constitutional: Negative.  HENT: Negative.    Eyes: Negative.   Cardiovascular:  Positive for chest pain and dyspnea on exertion.  Respiratory:  Positive for cough and shortness of breath.   Hematologic/Lymphatic: Negative.   Musculoskeletal: Negative.  Negative for joint pain.  Gastrointestinal:  Negative.   Genitourinary: Negative.   Neurological: Negative.    All other ROS reviewed and negative.     Physical Exam/Data:   Vitals:   04/16/21 1200 04/16/21 1220 04/16/21 1230 04/16/21 1245  BP: 111/82  129/84   Pulse: 70  75 64  Resp: 13  (!) 24 19  Temp:  98.5 F (36.9 C)    TempSrc:  Oral    SpO2: 93%  100% 100%   No intake or output data in the 24 hours ending 04/16/21 1542 Last 3 Weights 04/10/2021 07/28/2019 03/25/2019  Weight (lbs) 130 lb 118 lb 9.6 oz 105 lb 12.8 oz  Weight (kg) 58.968 kg 53.797 kg 47.991 kg     There is no height or weight on file to calculate BMI.  General:  Well nourished, well developed, in no acute distress  HEENT: normal Neck: no JVD Vascular: No carotid bruits; Distal pulses 2+ bilaterally Cardiac:  normal S1, S2; RRR; no murmur   Lungs:  decreased breath sounds throughout  Abd: soft, nontender, no hepatomegaly  Ext: no edema Musculoskeletal:  No deformities, BUE and BLE strength normal and equal Skin: warm and dry  Neuro:  CNs 2-12 intact, no focal abnormalities noted Psych:  tearful  EKG:  The EKG was personally reviewed and demonstrates:  NSR small inf Q's no acute change Telemetry:  Telemetry was personally reviewed and demonstrates:  NSR  Relevant CV Studies: Cath 2010 1. Left main coronary artery bifurcated into the circumflex and LAD      and had no significant disease noted.  2. The left anterior descending is a large vessel that courses to the      apex and gives rise to a first small diagonal branch, a normal      second diagonal branch, and a small third diagonal branch.  There      was a 20% stenosis noted in the midportion of the LAD.  3. The left circumflex is a relatively large codominant vessel that      gives rise to  a large first marginal branch, a large second      marginal branch, and a small posterolateral branch.  There is a      stent in the mid to distal circumflex which extends into the second       marginal branch.  There is no significant in-stent stenosis noted.      There is a 30% stenosis noted prior to the stented segment in the      midportion of the circumflex.  4. The right coronary artery is a small to moderate size codominant      vessel that has serial 30% lesions in the midportion and gives rise      to a small posterior descending artery.  5. Left ventricular angiogram was performed in the RAO projection and      showed normal left ventricular systolic function with no wall      motion abnormalities.  Ejection fraction was estimated at 55%.    HEMODYNAMIC DATA:  Central aortic pressure 84/50.  Left ventricular  pressure 87/5.  End-diastolic pressure 11.    IMPRESSION:  1. Noncardiac etiology of chest pain.  2. Stable single-vessel coronary artery disease with patent stented      segment in the mid to distal circumflex coronary artery.  3. Mild nonobstructive disease in the right coronary artery and the      left anterior descending coronary artery.  4. Normal left ventricular systolic function.    RECOMMENDATIONS:  I do not think that this patient's chest pain could be  explained by a cardiac etiology.  I would like to check an  echocardiogram to rule out any significant pericardial effusion.  The  patient has been hypotensive during the hospitalization, but this was  while on a nitroglycerin drip.  We should also consider a GI source of  her constant chest pain.  I will discontinue her Pepcid and start her on  a proton pump inhibitor now.  Her Plavix will also be stopped.  We will  give a GI cocktail after she leaves the cath lab.  I will also check a D-  dimer level to rule out the possibility of pulmonary embolism.  Further  plans will be made by the Cardiology team who is following this patient.      Laboratory Data:  High Sensitivity Troponin:   Recent Labs  Lab 04/16/21 0913 04/16/21 1056  TROPONINIHS 3 2     Chemistry Recent Labs  Lab  04/16/21 0913  NA 137  K 3.6  CL 103  CO2 27  GLUCOSE 100*  BUN 10  CREATININE 0.64  CALCIUM 9.0  GFRNONAA >60  ANIONGAP 7    No results for input(s): PROT, ALBUMIN, AST, ALT, ALKPHOS, BILITOT in the last 168 hours. Lipids No results for input(s): CHOL, TRIG, HDL, LABVLDL, LDLCALC, CHOLHDL in the last 168 hours.  Hematology Recent Labs  Lab 04/16/21 0913  WBC 16.4*  RBC 4.75  HGB 13.8  HCT 41.4  MCV 87.2  MCH 29.1  MCHC 33.3  RDW 13.8  PLT 345   Thyroid No results for input(s): TSH, FREET4 in the last 168 hours.  BNPNo results for input(s): BNP, PROBNP in the last 168 hours.  DDimer No results for input(s): DDIMER in the last 168 hours.   Radiology/Studies:  DG Chest 2 View  Result Date: 04/16/2021 CLINICAL DATA:  Mild chest pain. EXAM: CHEST - 2 VIEW COMPARISON:  None. FINDINGS: Normal mediastinum and cardiac silhouette. Chronic central bronchitic  markings. Normal pulmonary vasculature. No effusion, infiltrate, or pneumothorax. IMPRESSION: Chronic bronchitic markings.  No acute findings. Electronically Signed   By: Genevive Bi M.D.   On: 04/16/2021 10:08     Assessment and Plan:   Chest pain-lexiscan canceled today when having chest pain and diaphoresis, troponins negative will have lexiscan tomorrow  CAD with prior stent Cfx 2001 last cath 2010 patent stent and coronaries  COPD on home O2 mostly at night but using regularly now  Tobacco abuse-ongoing  History of CVA  HH/GERD   Risk Assessment/Risk Scores:     HEAR Score (for undifferentiated chest pain):  HEAR Score: 5          For questions or updates, please contact [facility] Please consult [email or web address] for contact info under    Signed, Jacolyn Reedy, PA-C  04/16/2021 3:42 PM

## 2021-04-16 NOTE — ED Triage Notes (Signed)
Pt coming from a stress test. Per patient she is having some mid sternal chest pain with exertion. Per Lurena Joiner, RN patient was diaphoretic and pale PTA in the ER.

## 2021-04-16 NOTE — H&P (Signed)
History and Physical  [name] [name] BBC:[id number] DOB: 01-Sep-1960 DOA: 04/16/2021   PCP: Lianne Moris, PA-C  Patient coming from: Home & is able to ambulate  Chief Complaint: Right sided chest pain  HPI: [name] [name] is a 60 y.o. female with medical history significant for CAD s/p stents in 2001, CVA, hyperlipidemia, COPD, ongoing tobacco abuse, GERD, history of PE, presented to the ED complaining of right-sided reproducible intermittent chest pain that has been going on for weeks, with associated shortness of breath, nonproductive cough and diaphoresis that has been ongoing for days as well.  Patient initially presented for outpatient stress test here at [facility], noted to be diaphoretic, and feeling poorly and was sent to the ED emergency room.  In the ED, vital signs stable, lab showed negative troponin, EKG with no acute ST changes.  Noted leukocytosis without any fever, other labs otherwise unremarkable.  Chest x-ray showed chronic bronchitic markings, no other acute findings.  Of note, patient still an active smoker, does not drink any alcohol or use any illicit drugs at the present.  Cardiology consulted.  Patient admitted for further management    ED Course: As mentioned above   Review of Systems: Review of systems are otherwise negative   Past Medical History:  Diagnosis Date   Acid reflux    CAD (coronary artery disease)    S/P cath with stent in 2001   COPD (chronic obstructive pulmonary disease) (HCC)    CVA (cerebral vascular accident) (HCC) 1992   DU (duodenal ulcer) 09/19/09   Hiatal hernia    EGD september 2007. normal except for small hiatal hernia. She underwent small bowel biopsy with history of diarrhea at that time. This was negative   History of colonoscopy    August 2007. She had a pedunculated polyp at 30 cm. which was hamartomatous polyp. She  is due for 5-year followup with family history of coln cancer in a brother at age 59   History of  colonoscopy    2005 revealed a linear ulcer with scar versus inflammatory process at the mid right colon, but normal terminal ileum. A biopsy of this area revealed an ulceration, but nonspecific   History of coronary angiogram    CT angiogram in 2007. negative with normal mesenteric arteries   History of kidney stones    Migraine    Pulmonary embolus (HCC) 1999   Ulcerative colitis (HCC)    Urinary frequency    Past Surgical History:  Procedure Laterality Date   ABDOMINAL HYSTERECTOMY     APPENDECTOMY     CESAREAN SECTION     CHOLECYSTECTOMY     COLONOSCOPY  01/2006   Rourk-pedunculated polyp 30 cm hamartomatous polyp,   COLONOSCOPY  2005   Lineal ulcer or mid right colon, normal TI nonspecific biopsy   CORONARY ANGIOPLASTY WITH STENT PLACEMENT  2011   Dr. Corinda Gubler, [place]   ESOPHAGOGASTRODUODENOSCOPY  09/19/09   Rourk -2 duodenal bulbar ulcers, small HH otherwise normal/tiny distal esophageal erosions with mild erosive reflux   ESOPHAGOGASTRODUODENOSCOPY  03/2006   Rourk-Hiatal hernia, negative small bowel biopsy   TONSILLECTOMY     Unilateral oophorectomy with hysterectomy      Social History:  reports that she has been smoking cigarettes. She has a 35.00 pack-year smoking history. She has never used smokeless tobacco. She reports that she does not drink alcohol and does not use drugs.   Allergies  Allergen Reactions   Codeine    Hydrocodone  Sulfa Antibiotics    Hydrocodone-Acetaminophen Hives, Nausea And Vomiting and Anxiety    Family History  Problem Relation Age of Onset   Colon cancer Brother 53       died 1 year later   Heart attack Mother 58   Diabetes Sister       Prior to Admission medications   Medication Sig Start Date End Date Taking? Authorizing Provider  albuterol (PROVENTIL HFA;VENTOLIN HFA) 108 (90 BASE) MCG/ACT inhaler Inhale 2 puffs into the lungs every 6 (six) hours as needed for shortness of breath.    Yes [provider]  atorvastatin  (LIPITOR) 40 MG tablet Take 40 mg by mouth daily. 04/03/21  Yes [provider]  buprenorphine (SUBUTEX) 8 MG SUBL SL tablet Place 8 mg under the tongue in the morning, at noon, and at bedtime.   Yes [provider]  Fluticasone-Salmeterol (ADVAIR) 250-50 MCG/DOSE AEPB Inhale 1 puff into the lungs 2 (two) times daily. 09/28/20  Yes [provider]  gabapentin (NEURONTIN) 300 MG capsule Take 600-1,200 mg by mouth See admin instructions. Take 600 mg by mouth four times daily and take 1200 mg at bedtime 10/12/20  Yes [provider]  guaiFENesin (MUCINEX) 600 MG 12 hr tablet Take 1 tablet (600 mg total) by mouth 2 (two) times daily. 04/11/14  Yes Erick Blinks, MD  hydrOXYzine (ATARAX/VISTARIL) 50 MG tablet Take 50 mg by mouth in the morning, at noon, and at bedtime. 11/06/20  Yes [provider]  Omeprazole 20 MG TBEC Take 20 mg by mouth daily as needed (acid reflux).   Yes [provider]  QUEtiapine (SEROQUEL) 100 MG tablet Take 100 mg by mouth at bedtime. 11/06/20  Yes [provider]  tiZANidine (ZANAFLEX) 4 MG tablet Take 4 mg by mouth every 8 (eight) hours as needed for muscle spasms. 10/03/20  Yes [provider]  traZODone (DESYREL) 100 MG tablet Take 300 mg by mouth at bedtime. 10/12/20  Yes [provider]  albuterol (PROVENTIL) (2.5 MG/3ML) 0.083% nebulizer solution Take 2.5 mg by nebulization every 6 (six) hours as needed for wheezing or shortness of breath. Patient not taking: Reported on 04/16/2021    [provider]  azithromycin (ZITHROMAX) 250 MG tablet Take by mouth. Patient not taking: Reported on 04/16/2021 04/10/21   [provider]  Budeson-Glycopyrrol-Formoterol (BREZTRI AEROSPHERE) 160-9-4.8 MCG/ACT AERO Inhale 2 puffs into the lungs 2 (two) times daily. (04/10/2021 - NOT TAKING) Patient not taking: No sig reported 04/10/21   Netta Neat., NP  ipratropium-albuterol (DUONEB) 0.5-2.5  (3) MG/3ML SOLN Take 3 mLs by nebulization every 4 (four) hours as needed. (04/10/2021 - NOT TAKING) Patient not taking: No sig reported 04/10/21   Netta Neat., NP  montelukast (SINGULAIR) 10 MG tablet Take 1 tablet (10 mg total) by mouth at bedtime. Patient not taking: Reported on 04/16/2021 10/05/19   Glenford Bayley, NP  predniSONE (DELTASONE) 20 MG tablet Take 40 mg by mouth daily. Patient not taking: Reported on 04/16/2021 04/10/21   [provider]  sertraline (ZOLOFT) 50 MG tablet Take 50 mg by mouth daily. Patient not taking: Reported on 04/16/2021 10/17/20   [provider]    Physical Exam: BP 129/84   Pulse 64   Temp 98.5 F (36.9 C) (Oral)   Resp 19   SpO2 100%   General: NAD Eyes: Normal ENT: Normal Neck: Supple Cardiovascular: S1, S2 present Respiratory: Diminished breath sounds bilaterally, noted wheezing Abdomen: Soft, nontender,  nondistended, bowel sounds present Skin: Normal Musculoskeletal: No pedal edema noted bilaterally Psychiatric: Normal mood Neurologic: Strength equal in all extremities, no obvious focal neurologic deficits noted          Labs on Admission:  Basic Metabolic Panel: Recent Labs  Lab 04/16/21 0913  NA 137  K 3.6  CL 103  CO2 27  GLUCOSE 100*  BUN 10  CREATININE 0.64  CALCIUM 9.0   Liver Function Tests: No results for input(s): AST, ALT, ALKPHOS, BILITOT, PROT, ALBUMIN in the last 168 hours. No results for input(s): LIPASE, AMYLASE in the last 168 hours. No results for input(s): AMMONIA in the last 168 hours. CBC: Recent Labs  Lab 04/16/21 0913  WBC 16.4*  NEUTROABS 12.3*  HGB 13.8  HCT 41.4  MCV 87.2  PLT 345   Cardiac Enzymes: No results for input(s): CKTOTAL, CKMB, CKMBINDEX, TROPONINI in the last 168 hours.  BNP (last 3 results) No results for input(s): BNP in the last 8760 hours.  ProBNP (last 3 results) No results for input(s): PROBNP in the last 8760 hours.  CBG: No results  for input(s): GLUCAP in the last 168 hours.  Radiological Exams on Admission: DG Chest 2 View  Result Date: 04/16/2021 CLINICAL DATA:  Mild chest pain. EXAM: CHEST - 2 VIEW COMPARISON:  None. FINDINGS: Normal mediastinum and cardiac silhouette. Chronic central bronchitic markings. Normal pulmonary vasculature. No effusion, infiltrate, or pneumothorax. IMPRESSION: Chronic bronchitic markings.  No acute findings. Electronically Signed   By: Genevive Bi M.D.   On: 04/16/2021 10:08    EKG: Independently reviewed.  No acute ST changes noted  Assessment/Plan Present on Admission:  Chest pain  Principal Problem:   Chest pain Active Problems:   COPD (chronic obstructive pulmonary disease) (HCC)   Chest pain, atypical History of CAD s/p stenting 2001 Right-sided reproducible intermittent chest pain, ?? Musculoskeletal Troponin negative, EKG with no acute ST changes Cardiology consulted, plan for nuclear stress test on 04/17/2021 Continue Lipitor Telemetry  Possible acute COPD exacerbation Patient with subjective shortness of breath, cough, wheezing noted bilaterally Active smoker Chest x-ray showed chronic bronchitis, no acute findings Start IV Solu-Medrol, duo nebs, continue home inhaler Start azithromycin for COPD exacerbation Supplemental O2  Leukocytosis Unknown etiology, possibly reactive/hemoconcentration Afebrile Daily CBC  GERD PPI  Hyperlipidemia Lipid panel pending Continue Lipitor  Chronic pain syndrome/anxiety Continue Subutex, trazodone, Zanaflex as needed, Seroquel, Atarax as needed, gabapentin  Tobacco abuse Advised to quit Nicotine patch    DVT prophylaxis: Lovenox  Code Status: Full  Family Communication: None at bedside  Disposition Plan: Plan to DC home  Consults called: Cardiology  Admission status: Observation    Briant Cedar MD [facility]   If 7PM-7AM, please contact  night-coverage [email or web address]  04/16/2021, 4:22 PM

## 2021-04-16 NOTE — ED Provider Notes (Signed)
[facility] EMERGENCY DEPARTMENT Provider Note   CSN: [id number] Arrival date & time: 04/16/21  6378     History Chief Complaint  Patient presents with   Chest Pain    [name] [name] is a 60 y.o. female.  HPI  Patient with history of COPD, CVA, CAD status post cath with stent in 2001 presents with chest pain and shortness of breath.  States that she has been experiencing symptoms for a while, but it worsened in the last 2 days.  Patient was supposed to have a cardiac stress test done today outpatient, she became diaphoretic and was sent to the ED.  Patient reports her chest pain is primarily on the right side, does radiate to the right arm.  There is no associated nausea or vomiting, the pain is worsened by exertion.  Patient with history of COPD is on 2 L of oxygen at baseline.  She is satting 100% on room air currently.  Past Medical History:  Diagnosis Date   Acid reflux    CAD (coronary artery disease)    S/P cath with stent in 2001   COPD (chronic obstructive pulmonary disease) (HCC)    CVA (cerebral vascular accident) (HCC) 1992   DU (duodenal ulcer) 09/19/09   Hiatal hernia    EGD september 2007. normal except for small hiatal hernia. She underwent small bowel biopsy with history of diarrhea at that time. This was negative   History of colonoscopy    August 2007. She had a pedunculated polyp at 30 cm. which was hamartomatous polyp. She  is due for 5-year followup with family history of coln cancer in a brother at age 75   History of colonoscopy    2005 revealed a linear ulcer with scar versus inflammatory process at the mid right colon, but normal terminal ileum. A biopsy of this area revealed an ulceration, but nonspecific   History of coronary angiogram    CT angiogram in 2007. negative with normal mesenteric arteries   History of kidney stones    Migraine    Pulmonary embolus (HCC) 1999   Ulcerative colitis (HCC)    Urinary frequency     Patient Active Problem  List   Diagnosis Date Noted   Community acquired pneumonia 04/09/2014   Pneumonia 04/09/2014   Dysuria 11/18/2011   Family history of colon cancer 11/18/2011   GERD 09/18/2009   DYSPHAGIA UNSPECIFIED 09/18/2009   EPIGASTRIC PAIN 09/18/2009   SMOKER 09/15/2009   MIGRAINE HEADACHE 09/15/2009   Coronary atherosclerosis 09/15/2009   CVA 09/15/2009   WEIGHT LOSS 09/15/2009   VOMITING 09/15/2009   URINARY FREQUENCY 09/15/2009   ABDOMINAL PAIN OTHER SPECIFIED SITE 09/15/2009   PULMONARY EMBOLISM, HX OF 09/15/2009   CONSTIPATION, CHRONIC, HX OF 09/15/2009   RENAL CALCULUS, HX OF 09/15/2009    Past Surgical History:  Procedure Laterality Date   ABDOMINAL HYSTERECTOMY     APPENDECTOMY     CESAREAN SECTION     CHOLECYSTECTOMY     COLONOSCOPY  01/2006   Rourk-pedunculated polyp 30 cm hamartomatous polyp,   COLONOSCOPY  2005   Lineal ulcer or mid right colon, normal TI nonspecific biopsy   CORONARY ANGIOPLASTY WITH STENT PLACEMENT  2011   Dr. Corinda Gubler, [place]   ESOPHAGOGASTRODUODENOSCOPY  09/19/09   Rourk -2 duodenal bulbar ulcers, small HH otherwise normal/tiny distal esophageal erosions with mild erosive reflux   ESOPHAGOGASTRODUODENOSCOPY  03/2006   Rourk-Hiatal hernia, negative small bowel biopsy   TONSILLECTOMY     Unilateral oophorectomy  with hysterectomy       OB History   No obstetric history on file.     Family History  Problem Relation Age of Onset   Colon cancer Brother 35       died 1 year later   Heart attack Mother 29   Diabetes Sister     Social History   Tobacco Use   Smoking status: Every Day    Packs/day: 1.00    Years: 35.00    Pack years: 35.00    Types: Cigarettes   Smokeless tobacco: Never   Tobacco comments:    1/3 of a pack 07/28/19  Vaping Use   Vaping Use: Never used  Substance Use Topics   Alcohol use: No   Drug use: No    Home Medications Prior to Admission medications   Medication Sig Start Date End Date Taking? Authorizing Provider   albuterol (PROVENTIL HFA;VENTOLIN HFA) 108 (90 BASE) MCG/ACT inhaler Inhale 2 puffs into the lungs every 6 (six) hours as needed for shortness of breath.     [provider]  albuterol (PROVENTIL) (2.5 MG/3ML) 0.083% nebulizer solution Take 2.5 mg by nebulization every 6 (six) hours as needed for wheezing or shortness of breath.    [provider]  atorvastatin (LIPITOR) 40 MG tablet Take 40 mg by mouth daily. 04/03/21   [provider]  Budeson-Glycopyrrol-Formoterol (BREZTRI AEROSPHERE) 160-9-4.8 MCG/ACT AERO Inhale 2 puffs into the lungs 2 (two) times daily. (04/10/2021 - NOT TAKING) 04/10/21   Netta Neat., NP  buprenorphine (SUBUTEX) 8 MG SUBL SL tablet Place 8 mg under the tongue in the morning, at noon, and at bedtime.    [provider]  fluticasone (FLONASE) 50 MCG/ACT nasal spray Place 1 spray into both nostrils as needed for allergies or rhinitis.    [provider]  Fluticasone-Salmeterol (ADVAIR) 250-50 MCG/DOSE AEPB Inhale 1 puff into the lungs 2 (two) times daily. 09/28/20   [provider]  gabapentin (NEURONTIN) 300 MG capsule Take 600-1,200 mg by mouth See admin instructions. Take 600 mg by mouth four times daily and take 1200 mg at bedtime 10/12/20   [provider]  guaiFENesin (MUCINEX) 600 MG 12 hr tablet Take 1 tablet (600 mg total) by mouth 2 (two) times daily. 04/11/14   Erick Blinks, MD  hydrOXYzine (ATARAX/VISTARIL) 50 MG tablet Take 50 mg by mouth in the morning, at noon, and at bedtime. 11/06/20   [provider]  ipratropium-albuterol (DUONEB) 0.5-2.5 (3) MG/3ML SOLN Take 3 mLs by nebulization every 4 (four) hours as needed. (04/10/2021 - NOT TAKING) 04/10/21   Netta Neat., NP  mirtazapine (REMERON) 30 MG tablet Take 30 mg by mouth at bedtime. 10/12/20   [provider]  montelukast (SINGULAIR) 10 MG tablet Take 1 tablet (10 mg total) by mouth at bedtime. 10/05/19   Glenford Bayley, NP  Omeprazole 20 MG TBEC Take 20 mg by mouth daily as needed (acid reflux).    [provider]  QUEtiapine (SEROQUEL) 100 MG tablet Take 100 mg by mouth at bedtime. 11/06/20   [provider]  sertraline (ZOLOFT) 50 MG tablet Take 50 mg by mouth daily. 10/17/20   [provider]  tiZANidine (ZANAFLEX) 4 MG tablet Take 4 mg by mouth every 8 (eight) hours as needed for muscle spasms. 10/03/20   [provider]  traZODone (DESYREL) 100 MG tablet Take 300 mg by mouth at bedtime. 10/12/20   [provider]  Allergies    Codeine, Hydrocodone, Sulfa antibiotics, and Hydrocodone-acetaminophen  Review of Systems   Review of Systems  Constitutional:  Positive for chills and diaphoresis. Negative for fever.  HENT:  Negative for ear pain and sore throat.   Eyes:  Negative for pain and visual disturbance.  Respiratory:  Positive for shortness of breath. Negative for cough.   Cardiovascular:  Positive for chest pain. Negative for palpitations and leg swelling.  Gastrointestinal:  Negative for abdominal pain, nausea and vomiting.  Genitourinary:  Negative for dysuria and hematuria.  Musculoskeletal:  Negative for arthralgias, back pain and gait problem.  Skin:  Negative for color change and rash.  Neurological:  Negative for seizures, weakness and numbness.  All other systems reviewed and are negative.  Physical Exam Updated Vital Signs BP 122/84 (BP Location: Left Arm)   Pulse 73   Resp 14   SpO2 99%   Physical Exam Vitals and nursing note reviewed.  Constitutional:      General: She is not in acute distress.    Appearance: She is well-developed. She is diaphoretic.  HENT:     Head: Normocephalic and atraumatic.  Eyes:     Conjunctiva/sclera: Conjunctivae normal.  Cardiovascular:     Rate and Rhythm: Normal rate and regular rhythm.     Heart sounds: No murmur heard.    Comments: DP, PT, radial pulses 2+ bilaterally. Pulmonary:     Effort:  Pulmonary effort is normal. No respiratory distress.     Breath sounds: Normal breath sounds.  Abdominal:     Palpations: Abdomen is soft.     Tenderness: There is no abdominal tenderness.  Musculoskeletal:     Cervical back: Neck supple.     Comments: No edema, legs are roughly symmetric bilaterally.  Skin:    General: Skin is warm.  Neurological:     Mental Status: She is alert.    ED Results / Procedures / Treatments   Labs (all labs ordered are listed, but only abnormal results are displayed) Labs Reviewed  BASIC METABOLIC PANEL  CBC WITH DIFFERENTIAL/PLATELET  TROPONIN I (HIGH SENSITIVITY)    EKG None  Radiology No results found.  Procedures Procedures   Medications Ordered in ED Medications - No data to display  ED Course  I have reviewed the triage vital signs and the nursing notes.  Pertinent labs & imaging results that were available during my care of the patient were reviewed by me and considered in my medical decision making (see chart for details).    MDM Rules/Calculators/A&P                           Patient vitals are stable, she is pale appearing.  Significant cardiac history, heart score is 5.  Concern for unstable angina/worsening cardiac disease.  Will get troponin and labs.  Patient has a leukocytosis, not anemic and no gross electrolyte derangement.  Chest x-ray is without any acute disease findings, no signs of pneumonia.  EKG without ST elevations or depressions or LVH, does not appear grossly changed from prior.  However, patient still feels terrible.  She reports she still having chest pain.  We will give sublingual nitroglycerin.  UA and second troponin still pending.  No tachycardia or hypoxia, patient is not having any pleuritic chest pain.  Doubt this is a PE.  Negative delta troponin, chest pain improved with nitroglycerin.  Concerning for unstable angina, will consult cardiology and plan to admit  the patient.  UA negative for signs  of urinary tract infection, delta Trope negative so do not think patient will need emergent cardiac cath.  However, will consult with cardiology.  Call and cardiology agreeable to see the patient.  We will do stress test tomorrow morning.  Patient admitted to the hospitalist service.    Final Clinical Impression(s) / ED Diagnoses Final diagnoses:  None    Rx / DC Orders ED Discharge Orders     None        Theron Arista, PA-C 04/16/21 1252    Tanda Rockers A, DO 04/17/21 [phone]

## 2021-04-17 ENCOUNTER — Encounter (HOSPITAL_COMMUNITY): Payer: Self-pay | Admitting: Internal Medicine

## 2021-04-17 ENCOUNTER — Observation Stay (HOSPITAL_COMMUNITY): Payer: Medicare Other

## 2021-04-17 ENCOUNTER — Other Ambulatory Visit: Payer: Self-pay

## 2021-04-17 ENCOUNTER — Observation Stay (HOSPITAL_BASED_OUTPATIENT_CLINIC_OR_DEPARTMENT_OTHER): Payer: Medicare Other

## 2021-04-17 DIAGNOSIS — I25110 Atherosclerotic heart disease of native coronary artery with unstable angina pectoris: Secondary | ICD-10-CM | POA: Diagnosis not present

## 2021-04-17 DIAGNOSIS — G894 Chronic pain syndrome: Secondary | ICD-10-CM | POA: Diagnosis not present

## 2021-04-17 DIAGNOSIS — I200 Unstable angina: Secondary | ICD-10-CM

## 2021-04-17 DIAGNOSIS — R079 Chest pain, unspecified: Secondary | ICD-10-CM | POA: Diagnosis not present

## 2021-04-17 DIAGNOSIS — J441 Chronic obstructive pulmonary disease with (acute) exacerbation: Secondary | ICD-10-CM | POA: Diagnosis not present

## 2021-04-17 LAB — ECHOCARDIOGRAM COMPLETE
Area-P 1/2: 5.66 cm2
Height: 64 in
S' Lateral: 2.8 cm
Single Plane A4C EF: 81.6 %
Weight: 2079.73 oz

## 2021-04-17 LAB — LIPID PANEL
Cholesterol: 105 mg/dL (ref 0–200)
Cholesterol: 106 mg/dL (ref 0–200)
HDL: 45 mg/dL (ref >40)
HDL: 46 mg/dL (ref >40)
LDL Cholesterol: 40 mg/dL (ref 0–99)
LDL Cholesterol: 41 mg/dL (ref 0–99)
Total CHOL/HDL Ratio: 2.3 RATIO
Total CHOL/HDL Ratio: 2.3 RATIO
Triglycerides: 97 mg/dL (ref <150)
Triglycerides: 99 mg/dL (ref <150)
VLDL: 19 mg/dL (ref 0–40)
VLDL: 20 mg/dL (ref 0–40)

## 2021-04-17 LAB — CBC WITH DIFFERENTIAL/PLATELET
Abs Immature Granulocytes: 0.1 10*3/uL — ABNORMAL HIGH (ref 0.00–0.07)
Basophils Absolute: 0.1 10*3/uL (ref 0.0–0.1)
Basophils Relative: 0 %
Eosinophils Absolute: 0.6 10*3/uL — ABNORMAL HIGH (ref 0.0–0.5)
Eosinophils Relative: 5 %
HCT: 35.2 % — ABNORMAL LOW (ref 36.0–46.0)
Hemoglobin: 11.5 g/dL — ABNORMAL LOW (ref 12.0–15.0)
Immature Granulocytes: 1 %
Lymphocytes Relative: 7 %
Lymphs Abs: 0.8 10*3/uL (ref 0.7–4.0)
MCH: 29 pg (ref 26.0–34.0)
MCHC: 32.7 g/dL (ref 30.0–36.0)
MCV: 88.7 fL (ref 80.0–100.0)
Monocytes Absolute: 0.3 10*3/uL (ref 0.1–1.0)
Monocytes Relative: 2 %
Neutro Abs: 10 10*3/uL — ABNORMAL HIGH (ref 1.7–7.7)
Neutrophils Relative %: 85 %
Platelets: 267 10*3/uL (ref 150–400)
RBC: 3.97 MIL/uL (ref 3.87–5.11)
RDW: 14.3 % (ref 11.5–15.5)
WBC: 11.8 10*3/uL — ABNORMAL HIGH (ref 4.0–10.5)
nRBC: 0 % (ref 0.0–0.2)

## 2021-04-17 LAB — BASIC METABOLIC PANEL
Anion gap: 6 (ref 5–15)
BUN: 11 mg/dL (ref 8–23)
CO2: 26 mmol/L (ref 22–32)
Calcium: 8.2 mg/dL — ABNORMAL LOW (ref 8.9–10.3)
Chloride: 106 mmol/L (ref 98–111)
Creatinine, Ser: 0.63 mg/dL (ref 0.44–1.00)
GFR, Estimated: >60 mL/min (ref >60)
Glucose, Bld: 105 mg/dL — ABNORMAL HIGH (ref 70–99)
Potassium: 3.7 mmol/L (ref 3.5–5.1)
Sodium: 138 mmol/L (ref 135–145)

## 2021-04-17 LAB — TSH: TSH: 0.715 u[IU]/mL (ref 0.350–4.500)

## 2021-04-17 LAB — T4, FREE: Free T4: 1.32 ng/dL — ABNORMAL HIGH (ref 0.61–1.12)

## 2021-04-17 LAB — HEMOGLOBIN A1C
Hgb A1c MFr Bld: 5.9 % — ABNORMAL HIGH (ref 4.8–5.6)
Mean Plasma Glucose: 122.63 mg/dL

## 2021-04-17 LAB — HIV ANTIBODY (ROUTINE TESTING W REFLEX): HIV Screen 4th Generation wRfx: NONREACTIVE

## 2021-04-17 MED ORDER — MORPHINE SULFATE (PF) 2 MG/ML IV SOLN
2.0000 mg | Freq: Once | INTRAVENOUS | Status: AC
  Administered 2021-04-17: 2 mg via INTRAVENOUS
  Filled 2021-04-17: qty 1

## 2021-04-17 MED ORDER — TECHNETIUM TC 99M TETROFOSMIN IV KIT: 30.0000 | PACK | Freq: Once | INTRAVENOUS | Status: DC | PRN

## 2021-04-17 MED ORDER — REGADENOSON 0.4 MG/5ML IV SOLN
INTRAVENOUS | Status: AC
  Filled 2021-04-17: qty 5

## 2021-04-17 MED ORDER — SODIUM CHLORIDE FLUSH 0.9 % IV SOLN
INTRAVENOUS | Status: AC
  Administered 2021-04-17: 10 mL via INTRAVENOUS
  Filled 2021-04-17: qty 10

## 2021-04-17 MED ORDER — TECHNETIUM TC 99M TETROFOSMIN IV KIT
10.0000 | PACK | Freq: Once | INTRAVENOUS | Status: AC | PRN
  Administered 2021-04-17: 10.9 via INTRAVENOUS

## 2021-04-17 MED ORDER — SODIUM CHLORIDE 0.9 % IV BOLUS
500.0000 mL | Freq: Once | INTRAVENOUS | Status: AC
  Administered 2021-04-17: 500 mL via INTRAVENOUS

## 2021-04-17 MED ORDER — ASPIRIN EC 81 MG PO TBEC
81.0000 mg | DELAYED_RELEASE_TABLET | Freq: Every day | ORAL | Status: DC
  Administered 2021-04-17 – 2021-04-19 (×3): 81 mg via ORAL
  Filled 2021-04-17 (×3): qty 1

## 2021-04-17 MED ORDER — SODIUM CHLORIDE 0.9 % IV SOLN: INTRAVENOUS | Status: DC

## 2021-04-17 MED ORDER — IOHEXOL 350 MG/ML SOLN
100.0000 mL | Freq: Once | INTRAVENOUS | Status: AC | PRN
  Administered 2021-04-17: 100 mL via INTRAVENOUS

## 2021-04-17 NOTE — Progress Notes (Signed)
NT alerted this nurse of low bp of 81/50, this nurse check bp manually and received a reading of 78/52. Dinamap rechecked by this nurse confirmed low bp reading, MD on call alerted of findings. Orders reviewed and initiated.

## 2021-04-17 NOTE — Plan of Care (Signed)
  Problem: Education: Goal: Knowledge of General Education information will improve Description Including pain rating scale, medication(s)/side effects and non-pharmacologic comfort measures Outcome: Progressing   Problem: Health Behavior/Discharge Planning: Goal: Ability to manage health-related needs will improve Outcome: Progressing   

## 2021-04-17 NOTE — Care Management Obs Status (Signed)
MEDICARE OBSERVATION STATUS NOTIFICATION   Patient Details  Name: [name] [name] MRN: [id number] Date of Birth: 06/20/1961   Medicare Observation Status Notification Given:  Yes    Corey Harold 04/17/2021, 3:57 PM

## 2021-04-17 NOTE — Progress Notes (Signed)
PROGRESS NOTE  [name] [name]:[id number] DOB: [birth year] DOA: 04/16/2021 PCP: Lianne Moris, PA-C  HPI/Recap of past 24 hours: [name] [name] is a 60 y.o. female with medical history significant for CAD s/p stents in 2001, CVA, hyperlipidemia, COPD, ongoing tobacco abuse, GERD, history of PE, presented to the ED complaining of right-sided reproducible intermittent chest pain that has been going on for weeks, with associated shortness of breath, nonproductive cough and diaphoresis that has been ongoing for days as well.  Patient initially presented for outpatient stress test here at [facility], noted to be diaphoretic, and feeling poorly and was sent to the ED emergency room.  In the ED, vital signs stable, lab showed negative troponin, EKG with no acute ST changes.  Noted leukocytosis without any fever, other labs otherwise unremarkable.  Chest x-ray showed chronic bronchitic markings, no other acute findings.  Of note, patient still an active smoker, does not drink any alcohol or use any illicit drugs at the present.  Cardiology consulted.  Patient admitted for further management    Overnight and early this, patient continued to c/o R sided chest pain and received about IV morphine 6 mg. This morning, patient hypotensive with SBP in the 70-80s, unable to perform stress test due to that. Chest pain improving, denies any SOB, abdominal pain, N/V, fever/chills, reports non-productive cough    Assessment/Plan: Principal Problem:   Chest pain Active Problems:   COPD (chronic obstructive pulmonary disease) (HCC)    Chest pain, atypical History of CAD s/p stenting 2001 Right-sided reproducible intermittent chest pain, ?? Musculoskeletal Vs r/o ACS Troponin negative, EKG with no acute ST changes LDL 41 ECHO with EF of 65-70%, no RWMA D-dimer elevated, CTA chest negative for PE Cardiology consulted, unable to do nuclear stress test on 04/17/2021, due to hypotension, planning for  possible cardiac cath on 04/18/21 Continue Lipitor Telemetry   Possible acute COPD exacerbation Chronic hypoxic respiratory failure- on 3L home O2 Patient with subjective shortness of breath, cough, wheezing noted bilaterally Active smoker Chest x-ray showed chronic bronchitis, no acute findings Continue IV Solu-Medrol, plan to taper down to PO on 04/18/21, duo nebs, continue home inhaler Continue azithromycin for COPD exacerbation Supplemental O2  Hypotension Improving Likely 2/2 narcotic use Received about 6 mg of IV morphine within 8hrs due to chest pain S/P IVF Monitor closely   Leukocytosis Unknown etiology, possibly reactive/hemoconcentration Afebrile Daily CBC  Pre-diabetes A1c--> 5.9 Diet control, outpatient follow up   GERD PPI   Hyperlipidemia LDL 41 Continue Lipitor   Chronic pain syndrome/anxiety Continue Subutex, trazodone, Zanaflex as needed, Seroquel, Atarax as needed, gabapentin   Tobacco abuse Advised to quit Nicotine patch      Estimated body mass index is 22.31 kg/m as calculated from the following:   Height as of this encounter: 5\' 4"  (1.626 m).   Weight as of this encounter: 59 kg.     Code Status: Full  Family Communication: None at bedside  Disposition Plan: Status is: Observation  The patient will require care spanning > 2 midnights and should be moved to inpatient because: Level of care     Consultants: Cardiology  Procedures: None  Antimicrobials: None  DVT prophylaxis: Lovenox   Objective: Vitals:   04/17/21 0940 04/17/21 1052 04/17/21 1200 04/17/21 1441  BP: (!) 86/55 92/60 92/60  100/61  Pulse: 75 90 90 88  Resp: 18  18 18   Temp:   98 F (36.7 C) 98.6 F (37 C)  TempSrc:   Oral  Oral  SpO2:  98%  98%  Weight:   59 kg   Height:   5\' 4"  (1.626 m)     Intake/Output Summary (Last 24 hours) at 04/17/2021 1455 Last data filed at 04/17/2021 1300 Gross per 24 hour  Intake 240 ml  Output --  Net 240 ml    Filed Weights   04/17/21 1200  Weight: 59 kg    Exam: General: NAD  Cardiovascular: S1, S2 present Respiratory: Diminished BS b/l Abdomen: Soft, nontender, nondistended, bowel sounds present Musculoskeletal: No bilateral pedal edema noted Skin: Normal Psychiatry: Normal mood     Data Reviewed: CBC: Recent Labs  Lab 04/16/21 0913 04/17/21 0702  WBC 16.4* 11.8*  NEUTROABS 12.3* 10.0*  HGB 13.8 11.5*  HCT 41.4 35.2*  MCV 87.2 88.7  PLT 345 267   Basic Metabolic Panel: Recent Labs  Lab 04/16/21 0913 04/17/21 0702  NA 137 138  K 3.6 3.7  CL 103 106  CO2 27 26  GLUCOSE 100* 105*  BUN 10 11  CREATININE 0.64 0.63  CALCIUM 9.0 8.2*   GFR: Estimated Creatinine Clearance: 63.8 mL/min (by C-G formula based on SCr of 0.63 mg/dL). Liver Function Tests: No results for input(s): AST, ALT, ALKPHOS, BILITOT, PROT, ALBUMIN in the last 168 hours. No results for input(s): LIPASE, AMYLASE in the last 168 hours. No results for input(s): AMMONIA in the last 168 hours. Coagulation Profile: No results for input(s): INR, PROTIME in the last 168 hours. Cardiac Enzymes: No results for input(s): CKTOTAL, CKMB, CKMBINDEX, TROPONINI in the last 168 hours. BNP (last 3 results) No results for input(s): PROBNP in the last 8760 hours. HbA1C: Recent Labs    04/17/21 0702  HGBA1C 5.9*   CBG: No results for input(s): GLUCAP in the last 168 hours. Lipid Profile: Recent Labs    04/17/21 0702  CHOL 105  106  HDL 45  46  LDLCALC 40  41  TRIG 99  97  CHOLHDL 2.3  2.3   Thyroid Function Tests: Recent Labs    04/17/21 0702  TSH 0.715  FREET4 1.32*   Anemia Panel: No results for input(s): VITAMINB12, FOLATE, FERRITIN, TIBC, IRON, RETICCTPCT in the last 72 hours. Urine analysis:    Component Value Date/Time   COLORURINE YELLOW 04/16/2021 0913   APPEARANCEUR CLEAR 04/16/2021 0913   LABSPEC 1.009 04/16/2021 0913   PHURINE 8.0 04/16/2021 0913   GLUCOSEU NEGATIVE  04/16/2021 0913   HGBUR SMALL (A) 04/16/2021 0913   BILIRUBINUR NEGATIVE 04/16/2021 0913   KETONESUR NEGATIVE 04/16/2021 0913   PROTEINUR NEGATIVE 04/16/2021 0913   UROBILINOGEN 0.2 12/20/2012 2121   NITRITE NEGATIVE 04/16/2021 0913   LEUKOCYTESUR NEGATIVE 04/16/2021 0913   Sepsis Labs: @LABRCNTIP (procalcitonin:4,lacticidven:4)  ) Recent Results (from the past 240 hour(s))  Resp Panel by RT-PCR (Flu A&B, Covid) Nasopharyngeal Swab     Status: None   Collection Time: 04/16/21  9:32 AM   Specimen: Nasopharyngeal Swab; Nasopharyngeal(NP) swabs in vial transport medium  Result Value Ref Range Status   SARS Coronavirus 2 by RT PCR NEGATIVE NEGATIVE Final    Comment: (NOTE) SARS-CoV-2 target nucleic acids are NOT DETECTED.  The SARS-CoV-2 RNA is generally detectable in upper respiratory specimens during the acute phase of infection. The lowest concentration of SARS-CoV-2 viral copies this assay can detect is 138 copies/mL. A negative result does not preclude SARS-Cov-2 infection and should not be used as the sole basis for treatment or other patient management decisions. A negative result may occur with  improper specimen collection/handling, submission of specimen other than nasopharyngeal swab, presence of viral mutation(s) within the areas targeted by this assay, and inadequate number of viral copies(<138 copies/mL). A negative result must be combined with clinical observations, patient history, and epidemiological information. The expected result is Negative.  Fact Sheet for Patients:  BloggerCourse.com  Fact Sheet for Healthcare Providers:  SeriousBroker.it  This test is no t yet approved or cleared by the [place] FDA and  has been authorized for detection and/or diagnosis of SARS-CoV-2 by FDA under an Emergency Use Authorization (EUA). This EUA will remain  in effect (meaning this test can be used) for the duration of  the COVID-19 declaration under Section 564(b)(1) of the Act, 21 U.S.C.section 360bbb-3(b)(1), unless the authorization is terminated  or revoked sooner.       Influenza A by PCR NEGATIVE NEGATIVE Final   Influenza B by PCR NEGATIVE NEGATIVE Final    Comment: (NOTE) The Xpert Xpress SARS-CoV-2/FLU/RSV plus assay is intended as an aid in the diagnosis of influenza from Nasopharyngeal swab specimens and should not be used as a sole basis for treatment. Nasal washings and aspirates are unacceptable for Xpert Xpress SARS-CoV-2/FLU/RSV testing.  Fact Sheet for Patients: BloggerCourse.com  Fact Sheet for Healthcare Providers: SeriousBroker.it  This test is not yet approved or cleared by the [place] FDA and has been authorized for detection and/or diagnosis of SARS-CoV-2 by FDA under an Emergency Use Authorization (EUA). This EUA will remain in effect (meaning this test can be used) for the duration of the COVID-19 declaration under Section 564(b)(1) of the Act, 21 U.S.C. section 360bbb-3(b)(1), unless the authorization is terminated or revoked.  Performed at [facility], [address] [place], [place] [address]       Studies: CT Angio Chest Pulmonary Embolism (PE) W or WO Contrast  Result Date: 04/17/2021 CLINICAL DATA:  COPD, chest pain, shortness of breath, elevated D-dimer, low/intermediate prob PE suspected EXAM: CT ANGIOGRAPHY CHEST WITH CONTRAST TECHNIQUE: Multidetector CT imaging of the chest was performed using the standard protocol during bolus administration of intravenous contrast. Multiplanar CT image reconstructions and MIPs were obtained to evaluate the vascular anatomy. CONTRAST:  OMNIPAQUE IOHEXOL 350 MG/ML SOLN COMPARISON:  04/01/2021 FINDINGS: Cardiovascular: Heart size normal. Trace pericardial fluid. The RV is nondilated. Satisfactory opacification of pulmonary arteries noted, and there is no evidence  of pulmonary emboli. Scattered coronary calcifications. Adequate contrast opacification of the thoracic aorta with no evidence of dissection, aneurysm, or stenosis. There is patent brachiocephalic arch anatomy without proximal stenosis. Calcified atheromatous plaque in the arch and distal descending thoracic segment. Mediastinum/Nodes: No mass or adenopathy. Lungs/Pleura: No pleural effusion. Pulmonary emphysema. Dependent atelectasis posteriorly in both lower lobes. Lungs otherwise clear. Upper Abdomen: No acute findings. Musculoskeletal: Spondylitic changes in the visualized lower cervical spine. No fracture or worrisome bone lesion. Review of the MIP images confirms the above findings. IMPRESSION: 1. Negative for acute PE or thoracic aortic dissection. 2. Coronary and Aortic Atherosclerosis (ICD10-170.0). 3.   Emphysema (ICD10-J43.9). Electronically Signed   By: Corlis Leak M.D.   On: 04/17/2021 12:05   ECHOCARDIOGRAM COMPLETE  Result Date: 04/17/2021    ECHOCARDIOGRAM REPORT   Patient Name:   [name] [name] Date of Exam: 04/17/2021 Medical Rec #:  [id number]       Height:       64.0 in Accession #:    [id number]      Weight:       130.0 lb Date of  Birth:  1961/02/03      BSA:          1.629 m Patient Age:    59 years        BP:           92/60 mmHg Patient Gender: F               HR:           90 bpm. Exam Location:  [facility] Procedure: 2D Echo, Cardiac Doppler and Color Doppler Indications:    Chest pain  History:        Patient has no prior history of Echocardiogram examinations.                 CAD, COPD and Stroke, Signs/Symptoms:Chest Pain; Risk                 Factors:Current Smoker and Dyslipidemia.  Sonographer:    Lavenia Atlas RDCS Referring Phys: [id number] Ellsworth Lennox IMPRESSIONS  1. Left ventricular ejection fraction, by estimation, is 65 to 70%. The left ventricle has normal function. The left ventricle has no regional wall motion abnormalities. Left ventricular diastolic parameters  are indeterminate.  2. Right ventricular systolic function is normal. The right ventricular size is normal. There is normal pulmonary artery systolic pressure. The estimated right ventricular systolic pressure is 34.0 mmHg.  3. The mitral valve is grossly normal. Mild mitral valve regurgitation.  4. The aortic valve is tricuspid. Aortic valve regurgitation is not visualized.  5. The inferior vena cava is normal in size with <50% respiratory variability, suggesting right atrial pressure of 8 mmHg. Comparison(s): No prior Echocardiogram. FINDINGS  Left Ventricle: Left ventricular ejection fraction, by estimation, is 65 to 70%. The left ventricle has normal function. The left ventricle has no regional wall motion abnormalities. The left ventricular internal cavity size was normal in size. There is  borderline left ventricular hypertrophy. Left ventricular diastolic parameters are indeterminate. Right Ventricle: The right ventricular size is normal. No increase in right ventricular wall thickness. Right ventricular systolic function is normal. There is normal pulmonary artery systolic pressure. The tricuspid regurgitant velocity is 2.55 m/s, and  with an assumed right atrial pressure of 8 mmHg, the estimated right ventricular systolic pressure is 34.0 mmHg. Left Atrium: Left atrial size was normal in size. Right Atrium: Right atrial size was normal in size. Pericardium: There is no evidence of pericardial effusion. Mitral Valve: The mitral valve is grossly normal. Mild mitral valve regurgitation. Tricuspid Valve: The tricuspid valve is grossly normal. Tricuspid valve regurgitation is trivial. Aortic Valve: The aortic valve is tricuspid. Aortic valve regurgitation is not visualized. Pulmonic Valve: The pulmonic valve was grossly normal. Pulmonic valve regurgitation is trivial. Aorta: The aortic root is normal in size and structure. Venous: The inferior vena cava is normal in size with less than 50% respiratory  variability, suggesting right atrial pressure of 8 mmHg. IAS/Shunts: The interatrial septum appears to be lipomatous. No atrial level shunt detected by color flow Doppler.  LEFT VENTRICLE PLAX 2D LVIDd:         4.60 cm     Diastology LVIDs:         2.80 cm     LV e' medial:    7.07 cm/s LV PW:         1.00 cm     LV E/e' medial:  14.7 LV IVS:        1.00 cm     LV  e' lateral:   8.70 cm/s LVOT diam:     2.00 cm     LV E/e' lateral: 12.0 LV SV:         82 LV SV Index:   51 LVOT Area:     3.14 cm  LV Volumes (MOD) LV vol d, MOD A4C: 73.4 ml LV vol s, MOD A4C: 13.5 ml LV SV MOD A4C:     73.4 ml RIGHT VENTRICLE RV Basal diam:  2.40 cm RV S prime:     11.60 cm/s TAPSE (M-mode): 2.4 cm LEFT ATRIUM             Index        RIGHT ATRIUM          Index LA diam:        3.50 cm 2.15 cm/m   RA Area:     8.89 cm LA Vol (A2C):   31.5 ml 19.34 ml/m  RA Volume:   17.00 ml 10.44 ml/m LA Vol (A4C):   45.6 ml 27.99 ml/m LA Biplane Vol: 39.9 ml 24.49 ml/m  AORTIC VALVE LVOT Vmax:   113.00 cm/s LVOT Vmean:  77.000 cm/s LVOT VTI:    0.262 m  AORTA Ao Root diam: 2.60 cm MITRAL VALVE                TRICUSPID VALVE MV Area (PHT): 5.66 cm     TR Peak grad:   26.0 mmHg MV Decel Time: 134 msec     TR Vmax:        255.00 cm/s MV E velocity: 104.00 cm/s MV A velocity: 119.00 cm/s  SHUNTS MV E/A ratio:  0.87         Systemic VTI:  0.26 m                             Systemic Diam: 2.00 cm Nona Dell MD Electronically signed by Nona Dell MD Signature Date/Time: 04/17/2021/2:27:09 PM    Final     Scheduled Meds:  aspirin EC  81 mg Oral Daily   atorvastatin  40 mg Oral Daily   azithromycin  500 mg Oral Daily   buprenorphine  8 mg Sublingual TID   diclofenac Sodium  2 g Topical QID   enoxaparin (LOVENOX) injection  40 mg Subcutaneous Q24H   gabapentin  1,200 mg Oral QHS   gabapentin  600 mg Oral TID with meals   ipratropium-albuterol  3 mL Nebulization BID   methylPREDNISolone (SOLU-MEDROL) injection  40 mg Intravenous  Q12H   mometasone-formoterol  2 puff Inhalation BID   nicotine  21 mg Transdermal Daily   pantoprazole  40 mg Oral Daily   QUEtiapine  100 mg Oral QHS   traZODone  300 mg Oral QHS    Continuous Infusions:   LOS: 0 days     Briant Cedar, MD [facility]  If 7PM-7AM, please contact night-coverage [email or web address] 04/17/2021, 2:55 PM

## 2021-04-17 NOTE — Progress Notes (Addendum)
Progress Note  Patient Name: [name] [name] Date of Encounter: 04/17/2021  [facility] Cardiologist: Dina Rich, MD   Subjective   Some chest discomfort overnight but no recurrence this AM. She has been NPO since midnight for stress testing.   Inpatient Medications    Scheduled Meds:  atorvastatin  40 mg Oral Daily   azithromycin  500 mg Oral Daily   buprenorphine  8 mg Sublingual TID   diclofenac Sodium  2 g Topical QID   enoxaparin (LOVENOX) injection  40 mg Subcutaneous Q24H   gabapentin  1,200 mg Oral QHS   gabapentin  600 mg Oral TID with meals   ipratropium-albuterol  3 mL Nebulization BID   methylPREDNISolone (SOLU-MEDROL) injection  40 mg Intravenous Q12H   mometasone-formoterol  2 puff Inhalation BID   nicotine  21 mg Transdermal Once   nicotine  21 mg Transdermal Daily   pantoprazole  40 mg Oral Daily   QUEtiapine  100 mg Oral QHS   regadenoson       sodium chloride flush       traZODone  300 mg Oral QHS    PRN Meds: albuterol, hydrOXYzine, ondansetron (ZOFRAN) IV, technetium tetrofosmin, tiZANidine   Vital Signs    Vitals:   04/17/21 0648 04/17/21 0648 04/17/21 0700 04/17/21 0739  BP: (!) 78/52 (!) 78/55 (!) 87/59   Pulse:  74    Resp:      Temp:      TempSrc:      SpO2:    97%   No intake or output data in the 24 hours ending 04/17/21 0838 Last 3 Weights 04/10/2021 07/28/2019 03/25/2019  Weight (lbs) 130 lb 118 lb 9.6 oz 105 lb 12.8 oz  Weight (kg) 58.968 kg 53.797 kg 47.991 kg      Telemetry    Not reviewed. Seen in Nuc Med.   ECG    NSR, HR 62 with no acute ST changes.  - Personally Reviewed  Physical Exam   GEN: Pleasant female appearing in no acute distress.   Neck: No JVD Cardiac: RRR, no murmurs, rubs, or gallops.  Respiratory: Rhonchi throughout but no expiratory wheezing.  GI: Soft, nontender, non-distended  MS: No edema; No deformity. Neuro:  Nonfocal  Psych: Normal affect   Labs    High Sensitivity Troponin:    Recent Labs  Lab 04/16/21 0913 04/16/21 1056  TROPONINIHS 3 2     Chemistry Recent Labs  Lab 04/16/21 0913 04/17/21 0702  NA 137 138  K 3.6 3.7  CL 103 106  CO2 27 26  GLUCOSE 100* 105*  BUN 10 11  CREATININE 0.64 0.63  CALCIUM 9.0 8.2*  GFRNONAA >60 >60  ANIONGAP 7 6    Lipids  Recent Labs  Lab 04/17/21 0702  CHOL 105  106  TRIG 99  97  HDL 45  46  LDLCALC 40  41  CHOLHDL 2.3  2.3    Hematology Recent Labs  Lab 04/16/21 0913 04/17/21 0702  WBC 16.4* 11.8*  RBC 4.75 3.97  HGB 13.8 11.5*  HCT 41.4 35.2*  MCV 87.2 88.7  MCH 29.1 29.0  MCHC 33.3 32.7  RDW 13.8 14.3  PLT 345 267   Thyroid  Recent Labs  Lab 04/17/21 0702  TSH 0.715     DDimer  Recent Labs  Lab 04/16/21 0913  DDIMER 0.92*     Radiology    DG Chest 2 View  Result Date: 04/16/2021 CLINICAL DATA:  Mild chest  pain. EXAM: CHEST - 2 VIEW COMPARISON:  None. FINDINGS: Normal mediastinum and cardiac silhouette. Chronic central bronchitic markings. Normal pulmonary vasculature. No effusion, infiltrate, or pneumothorax. IMPRESSION: Chronic bronchitic markings.  No acute findings. Electronically Signed   By: Genevive Bi M.D.   On: 04/16/2021 10:08    Cardiac Studies   Cardiac Catheterization: 2010  IMPRESSION:  1. Noncardiac etiology of chest pain.  2. Stable single-vessel coronary artery disease with patent stented      segment in the mid to distal circumflex coronary artery.  3. Mild nonobstructive disease in the right coronary artery and the      left anterior descending coronary artery.  4. Normal left ventricular systolic function.   Patient Profile     60 y.o. female w/ PMH of CAD (s/p prior stent in 2001 with cath in 2010 showing patent stent along mid-distal LCx), COPD and prior CVA who presented to [facility] ED on 04/16/2021 for outpatient stress testing and reported chest pain and diaphoresis prior to testing, therefore sent to the ED for further evaluation.    Assessment & Plan    1. Chest Pain with Mixed Features - She has reported episodes of chest pain intermittently for the past several weeks which can occur at rest or with activity. Was tender to palpation along her right pectoral regional on admission.  - Hs Troponin values have been negative at 3 and 2 with EKG showing no acute ST changes. D-dimer elevated to 0.92 and the admitting team has ordered a CTA.  - She presented for a Lexiscan Myoview this AM but SBP in the 70's. She did receive Morphine overnight and had hypotension with this and previously received a 500 mL fluid bolus around 0654. Administered additional 250 mL fluid bolus while in the stress lab and had her consume a bottle of water and SBP still in the 80's. Will review with MD but could possibly try again later this AM after receiving additional IVF. She is scheduled for a CTA later today, so if pursuing a catheterization, this would be delayed until tomorrow given contrast administration today. Will order her echocardiogram which was scheduled as an outpatient as this would help guide the need for further testing. Continue statin therapy. Will add ASA.   2. CAD - She had a prior stent to the LCx in 2001 which was patent by repeat cath in 2010. Planning for repeat ischemic evaluation as outlined above.  - Unclear why she is not on ASA. Will add ASA 81mg  daily. Continue Atorvastatin 40mg  daily. She has not been on BB therapy due to intermittent wheezing.   3. HLD - LDL at 40 this admission. Continue Atorvastatin 40mg  daily.   4. COPD Exacerbation - She is on supplemental oxygen at home and presented with worsening dyspnea and wheezing. She does not rhonchi this AM but no acute wheezing. Remains on IV steroids, antibiotics and scheduled nebulizer treatments. Further management per the admitting team.    For questions or updates, please contact [facility] Please consult [email or web address] for contact info under      Signed, , PA-C  04/17/2021, 8:38 AM     Attending note:  Patient seen and examined.  I reviewed her recent outpatient notes, cardiology consultation, and we discussed her symptoms as well.  She describes her chest discomfort as a pulling sensation, sometimes a burning centrally, but also a feeling of tightness with activity associated with diaphoresis in the last few days.  She had actually  presented to the hospital for an outpatient stress test when she developed exertional chest tightness with diaphoresis and breathlessness resulting in her current admission.  She has ruled out for ACS however and her ECG is nonspecific.  On examination this morning she is in no distress.  Reports some mild dull chest discomfort.  She has been hypotensive since receiving morphine, systolics in the 80s at this point despite IV fluids.  Lungs exhibit decreased breath sounds without active wheezing.  Cardiac exam reveals RRR without gallop or rub.  Pertinent lab work includes potassium 3.7, BUN 11, creatinine 0.63, high-sensitivity troponin I levels normal, LDL 41, WBC 11.8, hemoglobin 11.5, platelets 267, D-dimer 0.92.  Would cancel Lexiscan Myoview for now.  Go ahead with chest CTA already planned by primary team to exclude pulmonary embolus in light of elevated D-dimer.  Obtain echocardiogram to assess cardiac structure and function.  My sense is that we should pursue a diagnostic cardiac catheterization to clearly define coronary anatomy in light of reported progressive symptoms presuming her chest CTA is reassuring.  This would need to be planned for tomorrow however.  Jonelle Sidle, M.D., F.A.C.C.

## 2021-04-18 ENCOUNTER — Ambulatory Visit (HOSPITAL_COMMUNITY)
Admission: EM | Disposition: A | Discharge status: Home / Self Care | Payer: Self-pay | Source: Home / Self Care | Attending: Emergency Medicine

## 2021-04-18 DIAGNOSIS — I25110 Atherosclerotic heart disease of native coronary artery with unstable angina pectoris: Secondary | ICD-10-CM | POA: Diagnosis not present

## 2021-04-18 DIAGNOSIS — I200 Unstable angina: Secondary | ICD-10-CM

## 2021-04-18 DIAGNOSIS — R079 Chest pain, unspecified: Secondary | ICD-10-CM | POA: Diagnosis not present

## 2021-04-18 HISTORY — PX: LEFT HEART CATH AND CORONARY ANGIOGRAPHY: CATH118249

## 2021-04-18 LAB — CBC WITH DIFFERENTIAL/PLATELET
Abs Immature Granulocytes: 0.12 10*3/uL — ABNORMAL HIGH (ref 0.00–0.07)
Basophils Absolute: 0 10*3/uL (ref 0.0–0.1)
Basophils Relative: 0 %
Eosinophils Absolute: 0 10*3/uL (ref 0.0–0.5)
Eosinophils Relative: 0 %
HCT: 34.4 % — ABNORMAL LOW (ref 36.0–46.0)
Hemoglobin: 11.1 g/dL — ABNORMAL LOW (ref 12.0–15.0)
Immature Granulocytes: 1 %
Lymphocytes Relative: 6 %
Lymphs Abs: 0.9 10*3/uL (ref 0.7–4.0)
MCH: 28.8 pg (ref 26.0–34.0)
MCHC: 32.3 g/dL (ref 30.0–36.0)
MCV: 89.4 fL (ref 80.0–100.0)
Monocytes Absolute: 0.8 10*3/uL (ref 0.1–1.0)
Monocytes Relative: 6 %
Neutro Abs: 12.9 10*3/uL — ABNORMAL HIGH (ref 1.7–7.7)
Neutrophils Relative %: 87 %
Platelets: 253 10*3/uL (ref 150–400)
RBC: 3.85 MIL/uL — ABNORMAL LOW (ref 3.87–5.11)
RDW: 14.6 % (ref 11.5–15.5)
WBC: 14.8 10*3/uL — ABNORMAL HIGH (ref 4.0–10.5)
nRBC: 0 % (ref 0.0–0.2)

## 2021-04-18 LAB — BASIC METABOLIC PANEL
Anion gap: 5 (ref 5–15)
BUN: 13 mg/dL (ref 8–23)
CO2: 28 mmol/L (ref 22–32)
Calcium: 8.5 mg/dL — ABNORMAL LOW (ref 8.9–10.3)
Chloride: 104 mmol/L (ref 98–111)
Creatinine, Ser: 0.6 mg/dL (ref 0.44–1.00)
GFR, Estimated: >60 mL/min (ref >60)
Glucose, Bld: 150 mg/dL — ABNORMAL HIGH (ref 70–99)
Potassium: 4.1 mmol/L (ref 3.5–5.1)
Sodium: 137 mmol/L (ref 135–145)

## 2021-04-18 SURGERY — LEFT HEART CATH AND CORONARY ANGIOGRAPHY
Anesthesia: LOCAL

## 2021-04-18 MED ORDER — SODIUM CHLORIDE 0.9% FLUSH: 3.0000 mL | Freq: Two times a day (BID) | INTRAVENOUS | Status: DC

## 2021-04-18 MED ORDER — MONTELUKAST SODIUM 10 MG PO TABS: 10.0000 mg | ORAL_TABLET | Freq: Every day | ORAL | Status: DC

## 2021-04-18 MED ORDER — IPRATROPIUM-ALBUTEROL 0.5-2.5 (3) MG/3ML IN SOLN: 3.0000 mL | RESPIRATORY_TRACT | Status: DC | PRN

## 2021-04-18 MED ORDER — IOHEXOL 350 MG/ML SOLN
INTRAVENOUS | Status: DC | PRN
  Administered 2021-04-18: 45 mL

## 2021-04-18 MED ORDER — ONDANSETRON HCL 4 MG/2ML IJ SOLN: 4.0000 mg | Freq: Four times a day (QID) | INTRAMUSCULAR | Status: DC | PRN

## 2021-04-18 MED ORDER — AZITHROMYCIN 250 MG PO TABS: 500.0000 mg | ORAL_TABLET | Freq: Every day | ORAL | Status: DC

## 2021-04-18 MED ORDER — ALBUTEROL SULFATE (2.5 MG/3ML) 0.083% IN NEBU: 2.5000 mg | INHALATION_SOLUTION | Freq: Four times a day (QID) | RESPIRATORY_TRACT | Status: DC | PRN

## 2021-04-18 MED ORDER — SODIUM CHLORIDE 0.9% FLUSH: 3.0000 mL | INTRAVENOUS | Status: DC | PRN

## 2021-04-18 MED ORDER — FENTANYL CITRATE (PF) 100 MCG/2ML IJ SOLN
INTRAMUSCULAR | Status: DC | PRN
  Administered 2021-04-18 (×2): 25 ug via INTRAVENOUS

## 2021-04-18 MED ORDER — SODIUM CHLORIDE 0.9 % IV SOLN: 250.0000 mL | INTRAVENOUS | Status: DC | PRN

## 2021-04-18 MED ORDER — HEPARIN SODIUM (PORCINE) 1000 UNIT/ML IJ SOLN
INTRAMUSCULAR | Status: DC | PRN
  Administered 2021-04-18: 5000 [IU] via INTRAVENOUS

## 2021-04-18 MED ORDER — MIDAZOLAM HCL 2 MG/2ML IJ SOLN
INTRAMUSCULAR | Status: DC | PRN
  Administered 2021-04-18 (×2): 1 mg via INTRAVENOUS

## 2021-04-18 MED ORDER — HEPARIN (PORCINE) IN NACL 1000-0.9 UT/500ML-% IV SOLN
INTRAVENOUS | Status: AC
  Filled 2021-04-18: qty 1000

## 2021-04-18 MED ORDER — SODIUM CHLORIDE 0.9% FLUSH
3.0000 mL | Freq: Two times a day (BID) | INTRAVENOUS | Status: DC
  Administered 2021-04-18: 3 mL via INTRAVENOUS

## 2021-04-18 MED ORDER — PREDNISONE 20 MG PO TABS
50.0000 mg | ORAL_TABLET | Freq: Every day | ORAL | Status: DC
  Administered 2021-04-19: 50 mg via ORAL
  Filled 2021-04-18: qty 1

## 2021-04-18 MED ORDER — SODIUM CHLORIDE 0.9 % WEIGHT BASED INFUSION
3.0000 mL/kg/h | INTRAVENOUS | Status: DC
  Administered 2021-04-18: 3 mL/kg/h via INTRAVENOUS

## 2021-04-18 MED ORDER — LABETALOL HCL 5 MG/ML IV SOLN: 10.0000 mg | INTRAVENOUS | Status: AC | PRN

## 2021-04-18 MED ORDER — PREDNISONE 20 MG PO TABS: 40.0000 mg | ORAL_TABLET | Freq: Every day | ORAL | Status: DC

## 2021-04-18 MED ORDER — SODIUM CHLORIDE 0.9 % IV SOLN: INTRAVENOUS | Status: AC

## 2021-04-18 MED ORDER — BUDESON-GLYCOPYRROL-FORMOTEROL 160-9-4.8 MCG/ACT IN AERO: 2.0000 | INHALATION_SPRAY | Freq: Two times a day (BID) | RESPIRATORY_TRACT | Status: DC

## 2021-04-18 MED ORDER — HEPARIN (PORCINE) IN NACL 1000-0.9 UT/500ML-% IV SOLN
INTRAVENOUS | Status: DC | PRN
  Administered 2021-04-18 (×2): 500 mL

## 2021-04-18 MED ORDER — VERAPAMIL HCL 2.5 MG/ML IV SOLN
INTRAVENOUS | Status: AC
  Filled 2021-04-18: qty 2

## 2021-04-18 MED ORDER — HEPARIN SODIUM (PORCINE) 1000 UNIT/ML IJ SOLN
INTRAMUSCULAR | Status: AC
  Filled 2021-04-18: qty 1

## 2021-04-18 MED ORDER — FENTANYL CITRATE (PF) 100 MCG/2ML IJ SOLN
INTRAMUSCULAR | Status: AC
  Filled 2021-04-18: qty 2

## 2021-04-18 MED ORDER — VERAPAMIL HCL 2.5 MG/ML IV SOLN: INTRAVENOUS | Status: DC | PRN

## 2021-04-18 MED ORDER — MIDAZOLAM HCL 2 MG/2ML IJ SOLN
INTRAMUSCULAR | Status: AC
  Filled 2021-04-18: qty 2

## 2021-04-18 MED ORDER — VERAPAMIL HCL 2.5 MG/ML IV SOLN
INTRAVENOUS | Status: DC | PRN
  Administered 2021-04-18: 10 mL via INTRA_ARTERIAL

## 2021-04-18 MED ORDER — GUAIFENESIN ER 600 MG PO TB12
600.0000 mg | ORAL_TABLET | Freq: Two times a day (BID) | ORAL | Status: DC
  Administered 2021-04-18 – 2021-04-19 (×2): 600 mg via ORAL
  Filled 2021-04-18 (×2): qty 1

## 2021-04-18 MED ORDER — HYDRALAZINE HCL 20 MG/ML IJ SOLN: 10.0000 mg | INTRAMUSCULAR | Status: AC | PRN

## 2021-04-18 MED ORDER — SODIUM CHLORIDE 0.9 % WEIGHT BASED INFUSION
1.0000 mL/kg/h | INTRAVENOUS | Status: DC
  Administered 2021-04-18: 1 mL/kg/h via INTRAVENOUS

## 2021-04-18 MED ORDER — LIDOCAINE HCL (PF) 1 % IJ SOLN
INTRAMUSCULAR | Status: AC
  Filled 2021-04-18: qty 30

## 2021-04-18 MED ORDER — SERTRALINE HCL 50 MG PO TABS: 50.0000 mg | ORAL_TABLET | Freq: Every day | ORAL | Status: DC

## 2021-04-18 SURGICAL SUPPLY — 10 items
CATH DIAG 6FR JR4 (CATHETERS) ×1 IMPLANT
CATH DIAG 6FR PIGTAIL ANGLED (CATHETERS) ×1 IMPLANT
CATH INFINITI 6F FL3.5 (CATHETERS) ×1 IMPLANT
DEVICE RAD TR BAND REGULAR (VASCULAR PRODUCTS) ×1 IMPLANT
GLIDESHEATH SLEND SS 6F .021 (SHEATH) ×1 IMPLANT
GUIDEWIRE INQWIRE 1.5J.035X260 (WIRE) IMPLANT
INQWIRE 1.5J .035X260CM (WIRE) ×2
KIT HEART LEFT (KITS) ×3 IMPLANT
PACK CARDIAC CATHETERIZATION (CUSTOM PROCEDURE TRAY) ×3 IMPLANT
TRANSDUCER W/STOPCOCK (MISCELLANEOUS) ×3 IMPLANT

## 2021-04-18 NOTE — Progress Notes (Signed)
PROGRESS NOTE    [name] [name]  UYQ:[id number] DOB: May 15, 1961 DOA: 04/16/2021 PCP: Lianne Moris, PA-C     Brief Narrative:  [name] [name] is a 60 y.o. female with medical history significant for CAD s/p stents in 2001, CVA, hyperlipidemia, COPD, ongoing tobacco abuse, GERD, history of PE, presented to the ED complaining of right-sided reproducible intermittent chest pain that has been going on for weeks, with associated shortness of breath, nonproductive cough and diaphoresis that has been ongoing for days as well.  Patient initially presented for outpatient stress test at [facility], noted to be diaphoretic, and feeling poorly and was sent to the ED emergency room.  In the ED, vital signs stable, lab showed negative troponin, EKG with no acute ST changes.  Noted leukocytosis without any fever, other labs otherwise unremarkable.  Chest x-ray showed chronic bronchitic markings, no other acute findings.  Of note, patient still an active smoker, does not drink any alcohol or use any illicit drugs at the present.  Cardiology consulted.  Patient admitted for further management.   New events last 24 hours / Subjective: Patient's blood pressure has stabilized this morning.  Continues to have some intermittent right-sided chest pain that she describes as a "pulling pain and tightness".  Cardiology planning for heart cath at [facility] this afternoon.  Assessment & Plan:   Principal Problem:   Chest pain Active Problems:   COPD (chronic obstructive pulmonary disease) (HCC)   Atypical chest pain with history of CAD status post stent 2001 -Echocardiogram showed EF 65 to 70% without wall motion abnormalities.  CTA chest negative for PE. Trop negative.  -Aspirin, lipitor  -Cardiology consulted -Plan for heart cath at [facility] 10/19  Acute COPD exacerbation with chronic hypoxemic respiratory failure -Uses 3 L nasal cannula O2 at baseline -IV Solu-Medrol -->  prednisone -Continue azithromycin -Dulera, duoneb   Hypotension -Resolved  Leukocytosis -In setting of steroid use, continue to trend  Prediabetes -A1c 5.9  Hyperlipidemia -Continue Lipitor  Chronic pain syndrome/anxiety -Continue Subutex, trazodone, Zanaflex as needed, Seroquel, Atarax as needed, gabapentin  DVT prophylaxis:  enoxaparin (LOVENOX) injection 40 mg Start: 04/16/21 2000 SCDs Start: 04/16/21 1846  Code Status:     Code Status Orders  (From admission, onward)           Start     Ordered   04/16/21 1846  Full code  Continuous        04/16/21 1846           Code Status History     Date Active Date Inactive Code Status Order ID Comments User Context   04/09/2014 1734 04/11/2014 1931 Full Code [id number]  Pearson Grippe, MD ED      Family Communication: None at bedside Disposition Plan:  Status is: Observation  The patient will require care spanning > 2 midnights and should be moved to inpatient because: heart cath planned for today     Consultants:  Cardiology  Procedures:  Heart cath 10/19  Antimicrobials:  Anti-infectives (From admission, onward)    Start     Dose/Rate Route Frequency Ordered Stop   04/17/21 1000  azithromycin (ZITHROMAX) tablet 500 mg        500 mg Oral Daily 04/16/21 1639 04/22/21 0959   04/16/21 1630  azithromycin (ZITHROMAX) tablet 500 mg  Status:  Discontinued        500 mg Oral Daily 04/16/21 1621 04/16/21 1639        Objective: Vitals:  04/17/21 1951 04/17/21 2040 04/18/21 0508 04/18/21 0744  BP:  111/75 109/65   Pulse:  81 69   Resp:  19 20   Temp:  98.2 F (36.8 C) 98.4 F (36.9 C)   TempSrc:  Oral    SpO2: 97% 97% 93% 94%  Weight:      Height:        Intake/Output Summary (Last 24 hours) at 04/18/2021 1059 Last data filed at 04/18/2021 0943 Gross per 24 hour  Intake 1082.24 ml  Output --  Net 1082.24 ml   Filed Weights   04/17/21 1200  Weight: 59 kg    Examination:  General exam:  Appears calm and comfortable  Respiratory system: Clear to auscultation. Respiratory effort normal. No respiratory distress. No conversational dyspnea. On [place] O2  Cardiovascular system: S1 & S2 heard, RRR. No murmurs. No pedal edema. Gastrointestinal system: Abdomen is nondistended, soft and nontender. Normal bowel sounds heard. Central nervous system: Alert and oriented. No focal neurological deficits. Speech clear.  Extremities: Symmetric in appearance  Skin: No rashes, lesions or ulcers on exposed skin  Psychiatry: Judgement and insight appear normal. Mood & affect appropriate.   Data Reviewed: I have personally reviewed following labs and imaging studies  CBC: Recent Labs  Lab 04/16/21 0913 04/17/21 0702 04/18/21 0602  WBC 16.4* 11.8* 14.8*  NEUTROABS 12.3* 10.0* 12.9*  HGB 13.8 11.5* 11.1*  HCT 41.4 35.2* 34.4*  MCV 87.2 88.7 89.4  PLT 345 267 253   Basic Metabolic Panel: Recent Labs  Lab 04/16/21 0913 04/17/21 0702 04/18/21 0602  NA 137 138 137  K 3.6 3.7 4.1  CL 103 106 104  CO2 27 26 28   GLUCOSE 100* 105* 150*  BUN 10 11 13   CREATININE 0.64 0.63 0.60  CALCIUM 9.0 8.2* 8.5*   GFR: Estimated Creatinine Clearance: 63.8 mL/min (by C-G formula based on SCr of 0.6 mg/dL). Liver Function Tests: No results for input(s): AST, ALT, ALKPHOS, BILITOT, PROT, ALBUMIN in the last 168 hours. No results for input(s): LIPASE, AMYLASE in the last 168 hours. No results for input(s): AMMONIA in the last 168 hours. Coagulation Profile: No results for input(s): INR, PROTIME in the last 168 hours. Cardiac Enzymes: No results for input(s): CKTOTAL, CKMB, CKMBINDEX, TROPONINI in the last 168 hours. BNP (last 3 results) No results for input(s): PROBNP in the last 8760 hours. HbA1C: Recent Labs    04/17/21 0702  HGBA1C 5.9*   CBG: No results for input(s): GLUCAP in the last 168 hours. Lipid Profile: Recent Labs    04/17/21 0702  CHOL 105  106  HDL 45  46  LDLCALC 40  41   TRIG 99  97  CHOLHDL 2.3  2.3   Thyroid Function Tests: Recent Labs    04/17/21 0702  TSH 0.715  FREET4 1.32*   Anemia Panel: No results for input(s): VITAMINB12, FOLATE, FERRITIN, TIBC, IRON, RETICCTPCT in the last 72 hours. Sepsis Labs: No results for input(s): PROCALCITON, LATICACIDVEN in the last 168 hours.  Recent Results (from the past 240 hour(s))  Resp Panel by RT-PCR (Flu A&B, Covid) Nasopharyngeal Swab     Status: None   Collection Time: 04/16/21  9:32 AM   Specimen: Nasopharyngeal Swab; Nasopharyngeal(NP) swabs in vial transport medium  Result Value Ref Range Status   SARS Coronavirus 2 by RT PCR NEGATIVE NEGATIVE Final    Comment: (NOTE) SARS-CoV-2 target nucleic acids are NOT DETECTED.  The SARS-CoV-2 RNA is generally detectable in upper respiratory specimens during  the acute phase of infection. The lowest concentration of SARS-CoV-2 viral copies this assay can detect is 138 copies/mL. A negative result does not preclude SARS-Cov-2 infection and should not be used as the sole basis for treatment or other patient management decisions. A negative result may occur with  improper specimen collection/handling, submission of specimen other than nasopharyngeal swab, presence of viral mutation(s) within the areas targeted by this assay, and inadequate number of viral copies(<138 copies/mL). A negative result must be combined with clinical observations, patient history, and epidemiological information. The expected result is Negative.  Fact Sheet for Patients:  BloggerCourse.com  Fact Sheet for Healthcare Providers:  SeriousBroker.it  This test is no t yet approved or cleared by the [place] FDA and  has been authorized for detection and/or diagnosis of SARS-CoV-2 by FDA under an Emergency Use Authorization (EUA). This EUA will remain  in effect (meaning this test can be used) for the duration of the COVID-19  declaration under Section 564(b)(1) of the Act, 21 U.S.C.section 360bbb-3(b)(1), unless the authorization is terminated  or revoked sooner.       Influenza A by PCR NEGATIVE NEGATIVE Final   Influenza B by PCR NEGATIVE NEGATIVE Final    Comment: (NOTE) The Xpert Xpress SARS-CoV-2/FLU/RSV plus assay is intended as an aid in the diagnosis of influenza from Nasopharyngeal swab specimens and should not be used as a sole basis for treatment. Nasal washings and aspirates are unacceptable for Xpert Xpress SARS-CoV-2/FLU/RSV testing.  Fact Sheet for Patients: BloggerCourse.com  Fact Sheet for Healthcare Providers: SeriousBroker.it  This test is not yet approved or cleared by the [place] FDA and has been authorized for detection and/or diagnosis of SARS-CoV-2 by FDA under an Emergency Use Authorization (EUA). This EUA will remain in effect (meaning this test can be used) for the duration of the COVID-19 declaration under Section 564(b)(1) of the Act, 21 U.S.C. section 360bbb-3(b)(1), unless the authorization is terminated or revoked.  Performed at [facility], [address] [place], [place] [address]       Radiology Studies: CT Angio Chest Pulmonary Embolism (PE) W or WO Contrast  Result Date: 04/17/2021 CLINICAL DATA:  COPD, chest pain, shortness of breath, elevated D-dimer, low/intermediate prob PE suspected EXAM: CT ANGIOGRAPHY CHEST WITH CONTRAST TECHNIQUE: Multidetector CT imaging of the chest was performed using the standard protocol during bolus administration of intravenous contrast. Multiplanar CT image reconstructions and MIPs were obtained to evaluate the vascular anatomy. CONTRAST:  OMNIPAQUE IOHEXOL 350 MG/ML SOLN COMPARISON:  04/01/2021 FINDINGS: Cardiovascular: Heart size normal. Trace pericardial fluid. The RV is nondilated. Satisfactory opacification of pulmonary arteries noted, and there is no evidence of  pulmonary emboli. Scattered coronary calcifications. Adequate contrast opacification of the thoracic aorta with no evidence of dissection, aneurysm, or stenosis. There is patent brachiocephalic arch anatomy without proximal stenosis. Calcified atheromatous plaque in the arch and distal descending thoracic segment. Mediastinum/Nodes: No mass or adenopathy. Lungs/Pleura: No pleural effusion. Pulmonary emphysema. Dependent atelectasis posteriorly in both lower lobes. Lungs otherwise clear. Upper Abdomen: No acute findings. Musculoskeletal: Spondylitic changes in the visualized lower cervical spine. No fracture or worrisome bone lesion. Review of the MIP images confirms the above findings. IMPRESSION: 1. Negative for acute PE or thoracic aortic dissection. 2. Coronary and Aortic Atherosclerosis (ICD10-170.0). 3.   Emphysema (ICD10-J43.9). Electronically Signed   By: Corlis Leak M.D.   On: 04/17/2021 12:05   ECHOCARDIOGRAM COMPLETE  Result Date: 04/17/2021    ECHOCARDIOGRAM REPORT   Patient Name:  [name] [name] Date of Exam: 04/17/2021 Medical Rec #:  [id number]       Height:       64.0 in Accession #:    [id number]      Weight:       130.0 lb Date of Birth:  24-Nov-1960      BSA:          1.629 m Patient Age:    59 years        BP:           92/60 mmHg Patient Gender: F               HR:           90 bpm. [facility] Procedure: 2D Echo, Cardiac Doppler and Color Doppler Indications:    Chest pain  History:        Patient has no prior history of Echocardiogram examinations.                 CAD, COPD and Stroke, Signs/Symptoms:Chest Pain; Risk                 Factors:Current Smoker and Dyslipidemia.  Sonographer:    Lavenia Atlas RDCS Referring Phys: [id number] Ellsworth Lennox IMPRESSIONS  1. Left ventricular ejection fraction, by estimation, is 65 to 70%. The left ventricle has normal function. The left ventricle has no regional wall motion abnormalities. Left ventricular diastolic parameters are  indeterminate.  2. Right ventricular systolic function is normal. The right ventricular size is normal. There is normal pulmonary artery systolic pressure. The estimated right ventricular systolic pressure is 34.0 mmHg.  3. The mitral valve is grossly normal. Mild mitral valve regurgitation.  4. The aortic valve is tricuspid. Aortic valve regurgitation is not visualized.  5. The inferior vena cava is normal in size with <50% respiratory variability, suggesting right atrial pressure of 8 mmHg. Comparison(s): No prior Echocardiogram. FINDINGS  Left Ventricle: Left ventricular ejection fraction, by estimation, is 65 to 70%. The left ventricle has normal function. The left ventricle has no regional wall motion abnormalities. The left ventricular internal cavity size was normal in size. There is  borderline left ventricular hypertrophy. Left ventricular diastolic parameters are indeterminate. Right Ventricle: The right ventricular size is normal. No increase in right ventricular wall thickness. Right ventricular systolic function is normal. There is normal pulmonary artery systolic pressure. The tricuspid regurgitant velocity is 2.55 m/s, and  with an assumed right atrial pressure of 8 mmHg, the estimated right ventricular systolic pressure is 34.0 mmHg. Left Atrium: Left atrial size was normal in size. Right Atrium: Right atrial size was normal in size. Pericardium: There is no evidence of pericardial effusion. Mitral Valve: The mitral valve is grossly normal. Mild mitral valve regurgitation. Tricuspid Valve: The tricuspid valve is grossly normal. Tricuspid valve regurgitation is trivial. Aortic Valve: The aortic valve is tricuspid. Aortic valve regurgitation is not visualized. Pulmonic Valve: The pulmonic valve was grossly normal. Pulmonic valve regurgitation is trivial. Aorta: The aortic root is normal in size and structure. Venous: The inferior vena cava is normal in size with less than 50% respiratory variability,  suggesting right atrial pressure of 8 mmHg. IAS/Shunts: The interatrial septum appears to be lipomatous. No atrial level shunt detected by color flow Doppler.  LEFT VENTRICLE PLAX 2D LVIDd:         4.60 cm     Diastology LVIDs:         2.80 cm  LV e' medial:    7.07 cm/s LV PW:         1.00 cm     LV E/e' medial:  14.7 LV IVS:        1.00 cm     LV e' lateral:   8.70 cm/s LVOT diam:     2.00 cm     LV E/e' lateral: 12.0 LV SV:         82 LV SV Index:   51 LVOT Area:     3.14 cm  LV Volumes (MOD) LV vol d, MOD A4C: 73.4 ml LV vol s, MOD A4C: 13.5 ml LV SV MOD A4C:     73.4 ml RIGHT VENTRICLE RV Basal diam:  2.40 cm RV S prime:     11.60 cm/s TAPSE (M-mode): 2.4 cm LEFT ATRIUM             Index        RIGHT ATRIUM          Index LA diam:        3.50 cm 2.15 cm/m   RA Area:     8.89 cm LA Vol (A2C):   31.5 ml 19.34 ml/m  RA Volume:   17.00 ml 10.44 ml/m LA Vol (A4C):   45.6 ml 27.99 ml/m LA Biplane Vol: 39.9 ml 24.49 ml/m  AORTIC VALVE LVOT Vmax:   113.00 cm/s LVOT Vmean:  77.000 cm/s LVOT VTI:    0.262 m  AORTA Ao Root diam: 2.60 cm MITRAL VALVE                TRICUSPID VALVE MV Area (PHT): 5.66 cm     TR Peak grad:   26.0 mmHg MV Decel Time: 134 msec     TR Vmax:        255.00 cm/s MV E velocity: 104.00 cm/s MV A velocity: 119.00 cm/s  SHUNTS MV E/A ratio:  0.87         Systemic VTI:  0.26 m                             Systemic Diam: 2.00 cm Nona Dell MD Electronically signed by Nona Dell MD Signature Date/Time: 04/17/2021/2:27:09 PM    Final       Scheduled Meds:  aspirin EC  81 mg Oral Daily   atorvastatin  40 mg Oral Daily   azithromycin  500 mg Oral Daily   buprenorphine  8 mg Sublingual TID   diclofenac Sodium  2 g Topical QID   enoxaparin (LOVENOX) injection  40 mg Subcutaneous Q24H   gabapentin  1,200 mg Oral QHS   gabapentin  600 mg Oral TID with meals   ipratropium-albuterol  3 mL Nebulization BID   mometasone-formoterol  2 puff Inhalation BID   nicotine  21 mg  Transdermal Daily   pantoprazole  40 mg Oral Daily   [START ON 04/19/2021] predniSONE  50 mg Oral Q breakfast   QUEtiapine  100 mg Oral QHS   traZODone  300 mg Oral QHS   Continuous Infusions:  sodium chloride       LOS: 0 days      Time spent: 30 minutes   Noralee Stain, DO [facility] 04/18/2021, 10:59 AM   Available via Epic secure chat 7am-7pm After these hours, please refer to coverage provider listed on [email or web address]

## 2021-04-18 NOTE — Progress Notes (Signed)
[facility] on the floor.  Report given to [facility] RN.  Transferred to [employer].

## 2021-04-18 NOTE — Interval H&P Note (Signed)
History and Physical Interval Note:  04/18/2021 2:57 PM  [name] [name]  has presented today for surgery, with the diagnosis of chest pain.  The various methods of treatment have been discussed with the patient and family. After consideration of risks, benefits and other options for treatment, the patient has consented to  Procedure(s): LEFT HEART CATH AND CORONARY ANGIOGRAPHY (N/A) as a surgical intervention.  The patient's history has been reviewed, patient examined, no change in status, stable for surgery.  I have reviewed the patient's chart and labs.  Questions were answered to the patient's satisfaction.    Cath Lab Visit (complete for each Cath Lab visit)  Clinical Evaluation Leading to the Procedure:   ACS: No.  Non-ACS:    Anginal Classification: CCS IV  Anti-ischemic medical therapy: Maximal Therapy (2 or more classes of medications)  Non-Invasive Test Results: No non-invasive testing performed  Prior CABG: No previous CABG        Orbie Pyo

## 2021-04-18 NOTE — Progress Notes (Signed)
Received patient from [facility] via [facility] alert and oriented X4, skin warm and dry, resp even and unlabored with complaint of chest pain a 3 out of 10 pain scale. Pt placed on bedside monitor,NS infusing in right forearm, and call bell in reach.  Consent signed and Dr Lynnette Caffey in to see patient. Pt to procedure area for cath procedure.

## 2021-04-18 NOTE — Progress Notes (Addendum)
Progress Note  Patient Name: [name] [name] Date of Encounter: 04/18/2021  Primary Cardiologist: Dina Rich, MD   Subjective   Reports intermittent chest discomfort overnight at rest. Anxious about her symptoms as her sister just underwent CABG last month. Breathing has improved. No orthopnea, PND or edema.   Inpatient Medications    Scheduled Meds:  aspirin EC  81 mg Oral Daily   atorvastatin  40 mg Oral Daily   azithromycin  500 mg Oral Daily   buprenorphine  8 mg Sublingual TID   diclofenac Sodium  2 g Topical QID   enoxaparin (LOVENOX) injection  40 mg Subcutaneous Q24H   gabapentin  1,200 mg Oral QHS   gabapentin  600 mg Oral TID with meals   ipratropium-albuterol  3 mL Nebulization BID   methylPREDNISolone (SOLU-MEDROL) injection  40 mg Intravenous Q12H   mometasone-formoterol  2 puff Inhalation BID   nicotine  21 mg Transdermal Daily   pantoprazole  40 mg Oral Daily   QUEtiapine  100 mg Oral QHS   traZODone  300 mg Oral QHS    PRN Meds: albuterol, hydrOXYzine, ondansetron (ZOFRAN) IV, technetium tetrofosmin, tiZANidine   Vital Signs    Vitals:   04/17/21 1951 04/17/21 2040 04/18/21 0508 04/18/21 0744  BP:  111/75 109/65   Pulse:  81 69   Resp:  19 20   Temp:  98.2 F (36.8 C) 98.4 F (36.9 C)   TempSrc:  Oral    SpO2: 97% 97% 93% 94%  Weight:      Height:        Intake/Output Summary (Last 24 hours) at 04/18/2021 0801 Last data filed at 04/17/2021 1825 Gross per 24 hour  Intake 962.24 ml  Output --  Net 962.24 ml    Last 3 Weights 04/17/2021 04/10/2021 07/28/2019  Weight (lbs) 129 lb 15.7 oz 130 lb 118 lb 9.6 oz  Weight (kg) 58.96 kg 58.968 kg 53.797 kg      Telemetry    NSR, HR in 70's to 80's.  - Personally Reviewed  ECG    No new tracings.   Physical Exam   General: Well developed, well nourished, female appearing in no acute distress. Head: Normocephalic, atraumatic.  Neck: Supple without bruits, JVD not elevated. Lungs:   Resp regular and unlabored, CTA with mild rhonchi. No wheezing.  Heart: RRR, S1, S2, no S3, S4, or murmur; no rub. Abdomen: Soft, non-tender, non-distended with normoactive bowel sounds. No hepatomegaly. No rebound/guarding. No obvious abdominal masses. Extremities: No clubbing, cyanosis, or pitting edema. Distal pedal pulses are 2+ bilaterally. Neuro: Alert and oriented X 3. Moves all extremities spontaneously. Psych: Normal affect.  Labs    Chemistry Recent Labs  Lab 04/16/21 0913 04/17/21 0702 04/18/21 0602  NA 137 138 137  K 3.6 3.7 4.1  CL 103 106 104  CO2 27 26 28   GLUCOSE 100* 105* 150*  BUN 10 11 13   CREATININE 0.64 0.63 0.60  CALCIUM 9.0 8.2* 8.5*  GFRNONAA >60 >60 >60  ANIONGAP 7 6 5      Hematology Recent Labs  Lab 04/16/21 0913 04/17/21 0702 04/18/21 0602  WBC 16.4* 11.8* 14.8*  RBC 4.75 3.97 3.85*  HGB 13.8 11.5* 11.1*  HCT 41.4 35.2* 34.4*  MCV 87.2 88.7 89.4  MCH 29.1 29.0 28.8  MCHC 33.3 32.7 32.3  RDW 13.8 14.3 14.6  PLT 345 267 253    DDimer  Recent Labs  Lab 04/16/21 0913  DDIMER 0.92*  Radiology    DG Chest 2 View  Result Date: 04/16/2021 CLINICAL DATA:  Mild chest pain. EXAM: CHEST - 2 VIEW COMPARISON:  None. FINDINGS: Normal mediastinum and cardiac silhouette. Chronic central bronchitic markings. Normal pulmonary vasculature. No effusion, infiltrate, or pneumothorax. IMPRESSION: Chronic bronchitic markings.  No acute findings. Electronically Signed   By: Genevive Bi M.D.   On: 04/16/2021 10:08   CT Angio Chest Pulmonary Embolism (PE) W or WO Contrast  Result Date: 04/17/2021 CLINICAL DATA:  COPD, chest pain, shortness of breath, elevated D-dimer, low/intermediate prob PE suspected EXAM: CT ANGIOGRAPHY CHEST WITH CONTRAST TECHNIQUE: Multidetector CT imaging of the chest was performed using the standard protocol during bolus administration of intravenous contrast. Multiplanar CT image reconstructions and MIPs were obtained to  evaluate the vascular anatomy. CONTRAST:  OMNIPAQUE IOHEXOL 350 MG/ML SOLN COMPARISON:  04/01/2021 FINDINGS: Cardiovascular: Heart size normal. Trace pericardial fluid. The RV is nondilated. Satisfactory opacification of pulmonary arteries noted, and there is no evidence of pulmonary emboli. Scattered coronary calcifications. Adequate contrast opacification of the thoracic aorta with no evidence of dissection, aneurysm, or stenosis. There is patent brachiocephalic arch anatomy without proximal stenosis. Calcified atheromatous plaque in the arch and distal descending thoracic segment. Mediastinum/Nodes: No mass or adenopathy. Lungs/Pleura: No pleural effusion. Pulmonary emphysema. Dependent atelectasis posteriorly in both lower lobes. Lungs otherwise clear. Upper Abdomen: No acute findings. Musculoskeletal: Spondylitic changes in the visualized lower cervical spine. No fracture or worrisome bone lesion. Review of the MIP images confirms the above findings. IMPRESSION: 1. Negative for acute PE or thoracic aortic dissection. 2. Coronary and Aortic Atherosclerosis (ICD10-170.0). 3.   Emphysema (ICD10-J43.9). Electronically Signed   By: Corlis Leak M.D.   On: 04/17/2021 12:05   Cardiac Studies   Echocardiogram: 04/17/2021 IMPRESSIONS    1. Left ventricular ejection fraction, by estimation, is 65 to 70%. The  left ventricle has normal function. The left ventricle has no regional  wall motion abnormalities. Left ventricular diastolic parameters are  indeterminate.   2. Right ventricular systolic function is normal. The right ventricular  size is normal. There is normal pulmonary artery systolic pressure. The  estimated right ventricular systolic pressure is 34.0 mmHg.   3. The mitral valve is grossly normal. Mild mitral valve regurgitation.   4. The aortic valve is tricuspid. Aortic valve regurgitation is not  visualized.   5. The inferior vena cava is normal in size with <50% respiratory   variability, suggesting right atrial pressure of 8 mmHg.   Comparison(s): No prior Echocardiogram.   Patient Profile     60 y.o. female w/ PMH of CAD (s/p prior stent in 2001 with cath in 2010 showing patent stent along mid-distal LCx), COPD and prior CVA who presented to [facility] ED on 04/16/2021 for outpatient stress testing and reported chest pain and diaphoresis prior to testing, therefore sent to the ED for further evaluation.   Assessment & Plan    1. Chest Pain with Mixed Features - Reports different types of chest pain over the past few weeks with occasional central burning discomfort along her chest which is reproducible but also episodes of chest pain with activity with associated dyspnea and diaphoresis (like what she developed the day she walked into the hospital for her outpatient stress test).  - Hs troponin values have been negative. D-dimer was elevated but CTA negative for a PE. Echo shows a preserved EF of 65-70% with no regional WMA.  She was initially rescheduled to undergo a stress  test yesterday but this was unable to be performed given hypotension with SBP in the 70's to 80's despite IVF (had received several doses of IV Morphine overnight).  - Reviewed with Dr. Diona Browner and given the time-frame since her last ischemic evaluation and continued pain, will plan for a cardiac catheterization for definitive evaluation. The patient understands that risks include but are not limited to stroke (1 in 1000), death (1 in 1000), kidney failure [usually temporary] (1 in 500), bleeding (1 in 200), allergic reaction [possibly serious] (1 in 200). Continue ASA and statin therapy.    2. CAD - She had a prior stent to the LCx in 2001 which was patent by repeat cath in 2010. No repeat ischemic testing since. She has been restarted on ASA 81mg  daily and continued on Atorvastatin 40mg  daily. Not on a BB due to baseline COPD with intermittent wheezing.   3. HLD - LDL was at 40 this admission  which is at goal. Continue Atorvastatin 40mg  daily.   4. COPD Exacerbation/Chronic Hypoxic Respiratory Failure - Remains on Azithromycin, IV steroids and scheduled nebulizer treatments. On 2L - 3L [place] at baseline. Further management per the admitting team.   5. Leukocytosis - WBC at 16.4 on admission and at 14.8 today. Continue to follow. She is now receicving IV steroids which could be contributing as well.    For questions or updates, please contact [facility] Please consult [email or web address] for contact info under Cardiology/STEMI.   , PA-C 8:01 AM 04/18/2021 Pager: [phone]   Attending note:  Patient seen and examined.  Reviewed interval hospital course and discussed the case with Ms. Lorri Frederick, I agree with her above findings.  Ms. Wolgamott has continued to have intermittent chest discomfort although cardiac enzymes negative during her initial assessment.  Lexiscan Myoview was canceled yesterday in light of low blood pressure (after receiving morphine) and also recurrent chest pain.  We have discussed diagnostic cardiac catheterization for clear assessment of coronary anatomy compared to her last evaluation in 2010.  She has a previously placed mid to distal circumflex stent that was patent at that time.  Risks and benefits discussed and she is in agreement to proceed.  Transfer being arranged to the hospitalist service at [facility] in anticipation of procedure later today.  Otherwise continue treatment for COPD exacerbation per primary team.  Continue aspirin and Lipitor.  No beta-blocker with active COPD exacerbation.  Leavy Cella, M.D., F.A.C.C.

## 2021-04-18 NOTE — H&P (View-Only) (Signed)
 Progress Note  Patient Name: [name] [name] Date of Encounter: 04/18/2021  Primary Cardiologist: Branch, Jonathan, MD   Subjective   Reports intermittent chest discomfort overnight at rest. Anxious about her symptoms as her sister just underwent CABG last month. Breathing has improved. No orthopnea, PND or edema.   Inpatient Medications    Scheduled Meds:  aspirin EC  81 mg Oral Daily   atorvastatin  40 mg Oral Daily   azithromycin  500 mg Oral Daily   buprenorphine  8 mg Sublingual TID   diclofenac Sodium  2 g Topical QID   enoxaparin (LOVENOX) injection  40 mg Subcutaneous Q24H   gabapentin  1,200 mg Oral QHS   gabapentin  600 mg Oral TID with meals   ipratropium-albuterol  3 mL Nebulization BID   methylPREDNISolone (SOLU-MEDROL) injection  40 mg Intravenous Q12H   mometasone-formoterol  2 puff Inhalation BID   nicotine  21 mg Transdermal Daily   pantoprazole  40 mg Oral Daily   QUEtiapine  100 mg Oral QHS   traZODone  300 mg Oral QHS    PRN Meds: albuterol, hydrOXYzine, ondansetron (ZOFRAN) IV, technetium tetrofosmin, tiZANidine   Vital Signs    Vitals:   04/17/21 1951 04/17/21 2040 04/18/21 0508 04/18/21 0744  BP:  111/75 109/65   Pulse:  81 69   Resp:  19 20   Temp:  98.2 F (36.8 C) 98.4 F (36.9 C)   TempSrc:  Oral    SpO2: 97% 97% 93% 94%  Weight:      Height:        Intake/Output Summary (Last 24 hours) at 04/18/2021 0801 Last data filed at 04/17/2021 1825 Gross per 24 hour  Intake 962.24 ml  Output --  Net 962.24 ml    Last 3 Weights 04/17/2021 04/10/2021 07/28/2019  Weight (lbs) 129 lb 15.7 oz 130 lb 118 lb 9.6 oz  Weight (kg) 58.96 kg 58.968 kg 53.797 kg      Telemetry    NSR, HR in 70's to 80's.  - Personally Reviewed  ECG    No new tracings.   Physical Exam   General: Well developed, well nourished, female appearing in no acute distress. Head: Normocephalic, atraumatic.  Neck: Supple without bruits, JVD not elevated. Lungs:   Resp regular and unlabored, CTA with mild rhonchi. No wheezing.  Heart: RRR, S1, S2, no S3, S4, or murmur; no rub. Abdomen: Soft, non-tender, non-distended with normoactive bowel sounds. No hepatomegaly. No rebound/guarding. No obvious abdominal masses. Extremities: No clubbing, cyanosis, or pitting edema. Distal pedal pulses are 2+ bilaterally. Neuro: Alert and oriented X 3. Moves all extremities spontaneously. Psych: Normal affect.  Labs    Chemistry Recent Labs  Lab 04/16/21 0913 04/17/21 0702 04/18/21 0602  NA 137 138 137  K 3.6 3.7 4.1  CL 103 106 104  CO2 27 26 28  GLUCOSE 100* 105* 150*  BUN 10 11 13  CREATININE 0.64 0.63 0.60  CALCIUM 9.0 8.2* 8.5*  GFRNONAA >60 >60 >60  ANIONGAP 7 6 5     Hematology Recent Labs  Lab 04/16/21 0913 04/17/21 0702 04/18/21 0602  WBC 16.4* 11.8* 14.8*  RBC 4.75 3.97 3.85*  HGB 13.8 11.5* 11.1*  HCT 41.4 35.2* 34.4*  MCV 87.2 88.7 89.4  MCH 29.1 29.0 28.8  MCHC 33.3 32.7 32.3  RDW 13.8 14.3 14.6  PLT 345 267 253    DDimer  Recent Labs  Lab 04/16/21 0913  DDIMER 0.92*       Radiology    DG Chest 2 View  Result Date: 04/16/2021 CLINICAL DATA:  Mild chest pain. EXAM: CHEST - 2 VIEW COMPARISON:  None. FINDINGS: Normal mediastinum and cardiac silhouette. Chronic central bronchitic markings. Normal pulmonary vasculature. No effusion, infiltrate, or pneumothorax. IMPRESSION: Chronic bronchitic markings.  No acute findings. Electronically Signed   By: Stewart  Edmunds M.D.   On: 04/16/2021 10:08   CT Angio Chest Pulmonary Embolism (PE) W or WO Contrast  Result Date: 04/17/2021 CLINICAL DATA:  COPD, chest pain, shortness of breath, elevated D-dimer, low/intermediate prob PE suspected EXAM: CT ANGIOGRAPHY CHEST WITH CONTRAST TECHNIQUE: Multidetector CT imaging of the chest was performed using the standard protocol during bolus administration of intravenous contrast. Multiplanar CT image reconstructions and MIPs were obtained to  evaluate the vascular anatomy. CONTRAST:  100mL OMNIPAQUE IOHEXOL 350 MG/ML SOLN COMPARISON:  04/01/2021 FINDINGS: Cardiovascular: Heart size normal. Trace pericardial fluid. The RV is nondilated. Satisfactory opacification of pulmonary arteries noted, and there is no evidence of pulmonary emboli. Scattered coronary calcifications. Adequate contrast opacification of the thoracic aorta with no evidence of dissection, aneurysm, or stenosis. There is patent brachiocephalic arch anatomy without proximal stenosis. Calcified atheromatous plaque in the arch and distal descending thoracic segment. Mediastinum/Nodes: No mass or adenopathy. Lungs/Pleura: No pleural effusion. Pulmonary emphysema. Dependent atelectasis posteriorly in both lower lobes. Lungs otherwise clear. Upper Abdomen: No acute findings. Musculoskeletal: Spondylitic changes in the visualized lower cervical spine. No fracture or worrisome bone lesion. Review of the MIP images confirms the above findings. IMPRESSION: 1. Negative for acute PE or thoracic aortic dissection. 2. Coronary and Aortic Atherosclerosis (ICD10-170.0). 3.   Emphysema (ICD10-J43.9). Electronically Signed   By: D  Hassell M.D.   On: 04/17/2021 12:05   Cardiac Studies   Echocardiogram: 04/17/2021 IMPRESSIONS    1. Left ventricular ejection fraction, by estimation, is 65 to 70%. The  left ventricle has normal function. The left ventricle has no regional  wall motion abnormalities. Left ventricular diastolic parameters are  indeterminate.   2. Right ventricular systolic function is normal. The right ventricular  size is normal. There is normal pulmonary artery systolic pressure. The  estimated right ventricular systolic pressure is 34.0 mmHg.   3. The mitral valve is grossly normal. Mild mitral valve regurgitation.   4. The aortic valve is tricuspid. Aortic valve regurgitation is not  visualized.   5. The inferior vena cava is normal in size with <50% respiratory   variability, suggesting right atrial pressure of 8 mmHg.   Comparison(s): No prior Echocardiogram.   Patient Profile     59 y.o. female w/ PMH of CAD (s/p prior stent in 2001 with cath in 2010 showing patent stent along mid-distal LCx), COPD and prior CVA who presented to [place] ED on 04/16/2021 for outpatient stress testing and reported chest pain and diaphoresis prior to testing, therefore sent to the ED for further evaluation.   Assessment & Plan    1. Chest Pain with Mixed Features - Reports different types of chest pain over the past few weeks with occasional central burning discomfort along her chest which is reproducible but also episodes of chest pain with activity with associated dyspnea and diaphoresis (like what she developed the day she walked into the hospital for her outpatient stress test).  - Hs troponin values have been negative. D-dimer was elevated but CTA negative for a PE. Echo shows a preserved EF of 65-70% with no regional WMA.  She was initially rescheduled to undergo a stress   test yesterday but this was unable to be performed given hypotension with SBP in the 70's to 80's despite IVF (had received several doses of IV Morphine overnight).  - Reviewed with Dr. Diona Browner and given the time-frame since her last ischemic evaluation and continued pain, will plan for a cardiac catheterization for definitive evaluation. The patient understands that risks include but are not limited to stroke (1 in 1000), death (1 in 1000), kidney failure [usually temporary] (1 in 500), bleeding (1 in 200), allergic reaction [possibly serious] (1 in 200). Continue ASA and statin therapy.    2. CAD - She had a prior stent to the LCx in 2001 which was patent by repeat cath in 2010. No repeat ischemic testing since. She has been restarted on ASA 81mg  daily and continued on Atorvastatin 40mg  daily. Not on a BB due to baseline COPD with intermittent wheezing.   3. HLD - LDL was at 40 this admission  which is at goal. Continue Atorvastatin 40mg  daily.   4. COPD Exacerbation/Chronic Hypoxic Respiratory Failure - Remains on Azithromycin, IV steroids and scheduled nebulizer treatments. On 2L - 3L [place] at baseline. Further management per the admitting team.   5. Leukocytosis - WBC at 16.4 on admission and at 14.8 today. Continue to follow. She is now receicving IV steroids which could be contributing as well.    For questions or updates, please contact [facility] Please consult [email or web address] for contact info under Cardiology/STEMI.   , PA-C 8:01 AM 04/18/2021 Pager: [phone]   Attending note:  Patient seen and examined.  Reviewed interval hospital course and discussed the case with [name] [name], I agree with her above findings.  [name] [name] has continued to have intermittent chest discomfort although cardiac enzymes negative during her initial assessment.  Lexiscan Myoview was canceled yesterday in light of low blood pressure (after receiving morphine) and also recurrent chest pain.  We have discussed diagnostic cardiac catheterization for clear assessment of coronary anatomy compared to her last evaluation in 2010.  She has a previously placed mid to distal circumflex stent that was patent at that time.  Risks and benefits discussed and she is in agreement to proceed.  Transfer being arranged to the hospitalist service at [facility] in anticipation of procedure later today.  Otherwise continue treatment for COPD exacerbation per primary team.  Continue aspirin and Lipitor.  No beta-blocker with active COPD exacerbation.  Leavy Cella, M.D., F.A.C.C.

## 2021-04-19 ENCOUNTER — Encounter (HOSPITAL_COMMUNITY): Payer: Self-pay | Admitting: Internal Medicine

## 2021-04-19 DIAGNOSIS — R079 Chest pain, unspecified: Secondary | ICD-10-CM | POA: Diagnosis not present

## 2021-04-19 DIAGNOSIS — E785 Hyperlipidemia, unspecified: Secondary | ICD-10-CM | POA: Diagnosis not present

## 2021-04-19 DIAGNOSIS — R072 Precordial pain: Secondary | ICD-10-CM

## 2021-04-19 DIAGNOSIS — I25110 Atherosclerotic heart disease of native coronary artery with unstable angina pectoris: Secondary | ICD-10-CM | POA: Diagnosis not present

## 2021-04-19 LAB — CBC WITH DIFFERENTIAL/PLATELET
Abs Immature Granulocytes: 0.08 K/uL — ABNORMAL HIGH (ref 0.00–0.07)
Basophils Absolute: 0 K/uL (ref 0.0–0.1)
Basophils Relative: 0 %
Eosinophils Absolute: 0.1 K/uL (ref 0.0–0.5)
Eosinophils Relative: 1 %
HCT: 31.7 % — ABNORMAL LOW (ref 36.0–46.0)
Hemoglobin: 10.3 g/dL — ABNORMAL LOW (ref 12.0–15.0)
Immature Granulocytes: 1 %
Lymphocytes Relative: 14 %
Lymphs Abs: 1.9 K/uL (ref 0.7–4.0)
MCH: 28.9 pg (ref 26.0–34.0)
MCHC: 32.5 g/dL (ref 30.0–36.0)
MCV: 88.8 fL (ref 80.0–100.0)
Monocytes Absolute: 0.9 K/uL (ref 0.1–1.0)
Monocytes Relative: 7 %
Neutro Abs: 10.5 K/uL — ABNORMAL HIGH (ref 1.7–7.7)
Neutrophils Relative %: 77 %
Platelets: 221 K/uL (ref 150–400)
RBC: 3.57 MIL/uL — ABNORMAL LOW (ref 3.87–5.11)
RDW: 14.8 % (ref 11.5–15.5)
WBC: 13.5 K/uL — ABNORMAL HIGH (ref 4.0–10.5)
nRBC: 0 % (ref 0.0–0.2)

## 2021-04-19 LAB — BASIC METABOLIC PANEL WITH GFR
Anion gap: 5 (ref 5–15)
BUN: 16 mg/dL (ref 8–23)
CO2: 27 mmol/L (ref 22–32)
Calcium: 8.3 mg/dL — ABNORMAL LOW (ref 8.9–10.3)
Chloride: 105 mmol/L (ref 98–111)
Creatinine, Ser: 0.6 mg/dL (ref 0.44–1.00)
GFR, Estimated: >60 mL/min
Glucose, Bld: 101 mg/dL — ABNORMAL HIGH (ref 70–99)
Potassium: 3.6 mmol/L (ref 3.5–5.1)
Sodium: 137 mmol/L (ref 135–145)

## 2021-04-19 MED ORDER — SODIUM CHLORIDE 0.9 % IV BOLUS
500.0000 mL | Freq: Once | INTRAVENOUS | Status: AC
  Administered 2021-04-19: 500 mL via INTRAVENOUS

## 2021-04-19 MED ORDER — ASPIRIN 81 MG PO TBEC: 81.0000 mg | DELAYED_RELEASE_TABLET | Freq: Every day | ORAL | 2 refills | Status: AC

## 2021-04-19 MED ORDER — SODIUM CHLORIDE 0.9 % IV SOLN: INTRAVENOUS | Status: AC

## 2021-04-19 MED ORDER — ATORVASTATIN CALCIUM 40 MG PO TABS: 40.0000 mg | ORAL_TABLET | Freq: Every day | ORAL | 2 refills | Status: DC

## 2021-04-19 NOTE — Progress Notes (Signed)
Cross-coverage note.   Patient given 500 cc NS bolus for asymptomatic hypotension.

## 2021-04-19 NOTE — Progress Notes (Signed)
Pts BP continued to trend in 80s systolic after NS bolus. IV found to be infiltrated upon rounds at 0530. IV removed and IV team consult put in. IV got a new IV access at 0605. Dr. Antionette Char notified and verbally ordered to continue maintenance dose of 122mL/hr NS. Fluids restarted at 0610. Last BP 83/55(65) at 0610. Will continue to monitor pt.

## 2021-04-19 NOTE — Progress Notes (Signed)
Pt BP low upon taking vitals. (81/56 and 78/51). On call MD was paged. Dr. Antionette Char ordered NS bolus. Bolus started at 0107. Will continue to monitor pt.

## 2021-04-19 NOTE — Progress Notes (Signed)
Progress Note  Patient Name: [name] [name] Date of Encounter: 04/19/2021  [facility] Cardiologist: Dina Rich, MD   Subjective   Feeling well. No chest pain, sob or palpitations.    Inpatient Medications    Scheduled Meds:  aspirin EC  81 mg Oral Daily   atorvastatin  40 mg Oral Daily   azithromycin  500 mg Oral Daily   buprenorphine  8 mg Sublingual TID   diclofenac Sodium  2 g Topical QID   enoxaparin (LOVENOX) injection  40 mg Subcutaneous Q24H   gabapentin  1,200 mg Oral QHS   gabapentin  600 mg Oral TID with meals   guaiFENesin  600 mg Oral BID   ipratropium-albuterol  3 mL Nebulization BID   mometasone-formoterol  2 puff Inhalation BID   nicotine  21 mg Transdermal Daily   pantoprazole  40 mg Oral Daily   predniSONE  50 mg Oral Q breakfast   QUEtiapine  100 mg Oral QHS   sodium chloride flush  3 mL Intravenous Q12H   traZODone  300 mg Oral QHS   Continuous Infusions:  sodium chloride     PRN Meds: sodium chloride, albuterol, hydrOXYzine, ondansetron (ZOFRAN) IV, sodium chloride flush, technetium tetrofosmin, tiZANidine   Vital Signs    Vitals:   04/19/21 0536 04/19/21 0610 04/19/21 0640 04/19/21 0914  BP: (!) 80/50 (!) 83/55 (!) 86/57   Pulse: 70  63   Resp: 17 15 17    Temp: 97.7 F (36.5 C)     TempSrc: Oral     SpO2: 98%  97% 98%  Weight:      Height:        Intake/Output Summary (Last 24 hours) at 04/19/2021 1033 Last data filed at 04/19/2021 0319 Gross per 24 hour  Intake 862.9 ml  Output --  Net 862.9 ml   Last 3 Weights 04/17/2021 04/10/2021 07/28/2019  Weight (lbs) 129 lb 15.7 oz 130 lb 118 lb 9.6 oz  Weight (kg) 58.96 kg 58.968 kg 53.797 kg      Telemetry    NSR - Personally Reviewed  ECG    N/A  Physical Exam   GEN: No acute distress.   Neck: No JVD Cardiac: RRR, no murmurs, rubs, or gallops. Right radial cath site without hematoma  Respiratory: diminished breath sound at L base  GI: Soft, nontender,  non-distended  MS: No edema; No deformity. Neuro:  Nonfocal  Psych: Normal affect   Labs    High Sensitivity Troponin:   Recent Labs  Lab 04/16/21 0913 04/16/21 1056  TROPONINIHS 3 2     Chemistry Recent Labs  Lab 04/17/21 0702 04/18/21 0602 04/19/21 0320  NA 138 137 137  K 3.7 4.1 3.6  CL 106 104 105  CO2 26 28 27   GLUCOSE 105* 150* 101*  BUN 11 13 16   CREATININE 0.63 0.60 0.60  CALCIUM 8.2* 8.5* 8.3*  GFRNONAA >60 >60 >60  ANIONGAP 6 5 5     Lipids  Recent Labs  Lab 04/17/21 0702  CHOL 105  106  TRIG 99  97  HDL 45  46  LDLCALC 40  41  CHOLHDL 2.3  2.3    Hematology Recent Labs  Lab 04/17/21 0702 04/18/21 0602 04/19/21 0320  WBC 11.8* 14.8* 13.5*  RBC 3.97 3.85* 3.57*  HGB 11.5* 11.1* 10.3*  HCT 35.2* 34.4* 31.7*  MCV 88.7 89.4 88.8  MCH 29.0 28.8 28.9  MCHC 32.7 32.3 32.5  RDW 14.3 14.6 14.8  PLT  267 253 221   Thyroid  Recent Labs  Lab 04/17/21 0702  TSH 0.715  FREET4 1.32*    BNPNo results for input(s): BNP, PROBNP in the last 168 hours.  DDimer  Recent Labs  Lab 04/16/21 0913  DDIMER 0.92*     Radiology    CT Angio Chest Pulmonary Embolism (PE) W or WO Contrast  Result Date: 04/17/2021 CLINICAL DATA:  COPD, chest pain, shortness of breath, elevated D-dimer, low/intermediate prob PE suspected EXAM: CT ANGIOGRAPHY CHEST WITH CONTRAST TECHNIQUE: Multidetector CT imaging of the chest was performed using the standard protocol during bolus administration of intravenous contrast. Multiplanar CT image reconstructions and MIPs were obtained to evaluate the vascular anatomy. CONTRAST:  OMNIPAQUE IOHEXOL 350 MG/ML SOLN COMPARISON:  04/01/2021 FINDINGS: Cardiovascular: Heart size normal. Trace pericardial fluid. The RV is nondilated. Satisfactory opacification of pulmonary arteries noted, and there is no evidence of pulmonary emboli. Scattered coronary calcifications. Adequate contrast opacification of the thoracic aorta with no evidence  of dissection, aneurysm, or stenosis. There is patent brachiocephalic arch anatomy without proximal stenosis. Calcified atheromatous plaque in the arch and distal descending thoracic segment. Mediastinum/Nodes: No mass or adenopathy. Lungs/Pleura: No pleural effusion. Pulmonary emphysema. Dependent atelectasis posteriorly in both lower lobes. Lungs otherwise clear. Upper Abdomen: No acute findings. Musculoskeletal: Spondylitic changes in the visualized lower cervical spine. No fracture or worrisome bone lesion. Review of the MIP images confirms the above findings. IMPRESSION: 1. Negative for acute PE or thoracic aortic dissection. 2. Coronary and Aortic Atherosclerosis (ICD10-170.0). 3.   Emphysema (ICD10-J43.9). Electronically Signed   By: Corlis Leak M.D.   On: 04/17/2021 12:05   CARDIAC CATHETERIZATION  Result Date: 04/18/2021   Prox RCA lesion is 20% stenosed.   Mid Cx lesion is 10% stenosed.   LV end diastolic pressure is normal. 1.  Widely patent mid left circumflex stent with mild disease elsewhere. 2.  Normal LV function with LVEDP of 17 mmHg.   ECHOCARDIOGRAM COMPLETE  Result Date: 04/17/2021    ECHOCARDIOGRAM REPORT   Patient Name:   [name] [name] Date of Exam: 04/17/2021 Medical Rec #:  [id number]       Height:       64.0 in Accession #:    [id number]      Weight:       130.0 lb Date of Birth:  Apr 09, 1961      BSA:          1.629 m Patient Age:    59 years        BP:           92/60 mmHg Patient Gender: F               HR:           90 bpm. Exam Location:  [facility] Procedure: 2D Echo, Cardiac Doppler and Color Doppler Indications:    Chest pain  History:        Patient has no prior history of Echocardiogram examinations.                 CAD, COPD and Stroke, Signs/Symptoms:Chest Pain; Risk                 Factors:Current Smoker and Dyslipidemia.  Sonographer:    Lavenia Atlas RDCS Referring Phys: [id number] Ellsworth Lennox IMPRESSIONS  1. Left ventricular ejection fraction, by estimation,  is 65 to 70%. The left ventricle has normal function. The left ventricle has no regional wall  motion abnormalities. Left ventricular diastolic parameters are indeterminate.  2. Right ventricular systolic function is normal. The right ventricular size is normal. There is normal pulmonary artery systolic pressure. The estimated right ventricular systolic pressure is 34.0 mmHg.  3. The mitral valve is grossly normal. Mild mitral valve regurgitation.  4. The aortic valve is tricuspid. Aortic valve regurgitation is not visualized.  5. The inferior vena cava is normal in size with <50% respiratory variability, suggesting right atrial pressure of 8 mmHg. Comparison(s): No prior Echocardiogram. FINDINGS  Left Ventricle: Left ventricular ejection fraction, by estimation, is 65 to 70%. The left ventricle has normal function. The left ventricle has no regional wall motion abnormalities. The left ventricular internal cavity size was normal in size. There is  borderline left ventricular hypertrophy. Left ventricular diastolic parameters are indeterminate. Right Ventricle: The right ventricular size is normal. No increase in right ventricular wall thickness. Right ventricular systolic function is normal. There is normal pulmonary artery systolic pressure. The tricuspid regurgitant velocity is 2.55 m/s, and  with an assumed right atrial pressure of 8 mmHg, the estimated right ventricular systolic pressure is 34.0 mmHg. Left Atrium: Left atrial size was normal in size. Right Atrium: Right atrial size was normal in size. Pericardium: There is no evidence of pericardial effusion. Mitral Valve: The mitral valve is grossly normal. Mild mitral valve regurgitation. Tricuspid Valve: The tricuspid valve is grossly normal. Tricuspid valve regurgitation is trivial. Aortic Valve: The aortic valve is tricuspid. Aortic valve regurgitation is not visualized. Pulmonic Valve: The pulmonic valve was grossly normal. Pulmonic valve regurgitation is  trivial. Aorta: The aortic root is normal in size and structure. Venous: The inferior vena cava is normal in size with less than 50% respiratory variability, suggesting right atrial pressure of 8 mmHg. IAS/Shunts: The interatrial septum appears to be lipomatous. No atrial level shunt detected by color flow Doppler.  LEFT VENTRICLE PLAX 2D LVIDd:         4.60 cm     Diastology LVIDs:         2.80 cm     LV e' medial:    7.07 cm/s LV PW:         1.00 cm     LV E/e' medial:  14.7 LV IVS:        1.00 cm     LV e' lateral:   8.70 cm/s LVOT diam:     2.00 cm     LV E/e' lateral: 12.0 LV SV:         82 LV SV Index:   51 LVOT Area:     3.14 cm  LV Volumes (MOD) LV vol d, MOD A4C: 73.4 ml LV vol s, MOD A4C: 13.5 ml LV SV MOD A4C:     73.4 ml RIGHT VENTRICLE RV Basal diam:  2.40 cm RV S prime:     11.60 cm/s TAPSE (M-mode): 2.4 cm LEFT ATRIUM             Index        RIGHT ATRIUM          Index LA diam:        3.50 cm 2.15 cm/m   RA Area:     8.89 cm LA Vol (A2C):   31.5 ml 19.34 ml/m  RA Volume:   17.00 ml 10.44 ml/m LA Vol (A4C):   45.6 ml 27.99 ml/m LA Biplane Vol: 39.9 ml 24.49 ml/m  AORTIC VALVE LVOT Vmax:   113.00 cm/s LVOT  Vmean:  77.000 cm/s LVOT VTI:    0.262 m  AORTA Ao Root diam: 2.60 cm MITRAL VALVE                TRICUSPID VALVE MV Area (PHT): 5.66 cm     TR Peak grad:   26.0 mmHg MV Decel Time: 134 msec     TR Vmax:        255.00 cm/s MV E velocity: 104.00 cm/s MV A velocity: 119.00 cm/s  SHUNTS MV E/A ratio:  0.87         Systemic VTI:  0.26 m                             Systemic Diam: 2.00 cm Nona Dell MD Electronically signed by Nona Dell MD Signature Date/Time: 04/17/2021/2:27:09 PM    Final     Cardiac Studies   Echocardiogram: 04/17/2021 IMPRESSIONS    1. Left ventricular ejection fraction, by estimation, is 65 to 70%. The  left ventricle has normal function. The left ventricle has no regional  wall motion abnormalities. Left ventricular diastolic parameters are   indeterminate.   2. Right ventricular systolic function is normal. The right ventricular  size is normal. There is normal pulmonary artery systolic pressure. The  estimated right ventricular systolic pressure is 34.0 mmHg.   3. The mitral valve is grossly normal. Mild mitral valve regurgitation.   4. The aortic valve is tricuspid. Aortic valve regurgitation is not  visualized.   5. The inferior vena cava is normal in size with <50% respiratory  variability, suggesting right atrial pressure of 8 mmHg.   Comparison(s): No prior Echocardiogram.   LEFT HEART CATH AND CORONARY ANGIOGRAPHY  04/18/21   Conclusion      Prox RCA lesion is 20% stenosed.   Mid Cx lesion is 10% stenosed.   LV end diastolic pressure is normal.   1.  Widely patent mid left circumflex stent with mild disease elsewhere. 2.  Normal LV function with LVEDP of 17 mmHg.    Patient Profile     60 y.o. female w/ PMH of CAD (s/p prior stent in 2001 with cath in 2010 showing patent stent along mid-distal LCx), COPD and prior CVA who presented to [facility] ED on 04/16/2021 for outpatient stress testing and reported chest pain and diaphoresis prior to testing, therefore sent to the ED for further evaluation.    Assessment & Plan   1. Chest Pain with Mixed Features with hx of CAD s/p LCX stent in 2001 -Hs troponin values have been negative. D-dimer was elevated but CTA negative for a PE. Echo shows a preserved EF of 65-70% with no regional WMA.  She was initially rescheduled to undergo a stress test yesterday but this was unable to be performed given hypotension with SBP in the 70's to 80's despite IVF (had received several doses of IV Morphine overnight).  - Cardiac cath with widely patent mid left circumflex stent with mild disease elsewhere. Normal LV function with LVEDP of 17 mmHg. - No recurrent chest pain.  - Continue ASA and statin   2. HLD - 04/17/2021: Cholesterol 106; Cholesterol 105; HDL 46; HDL 45; LDL  Cholesterol 41; LDL Cholesterol 40; Triglycerides 97; Triglycerides 99; VLDL 19; VLDL 20  - Continue statin   3.  COPD Exacerbation/Chronic Hypoxic Respiratory Failure - Per primary team   [facility] will sign off.   Medication Recommendations:  Continue ASA  and Statin  Other recommendations (labs, testing, etc): none  Follow up as an outpatient:  Dr. Wyline Mood as schedule on 06/11/21  For questions or updates, please contact [facility] Please consult [email or web address] for contact info under        SignedManson Passey, PA  04/19/2021, 10:33 AM

## 2021-04-19 NOTE — Discharge Summary (Signed)
Physician Discharge Summary  ALEEHA BOLINE RJJ:[id number] DOB: 04-Mar-1961 DOA: 04/16/2021  PCP: Lianne Moris, PA-C  Admit date: 04/16/2021 Discharge date: 04/19/2021  Admitted From: Home Discharge disposition: Home   Code Status: Full Code   Discharge Diagnosis:   Principal Problem:   Chest pain Active Problems:   COPD (chronic obstructive pulmonary disease) (HCC)   Unstable angina [facility])    Chief Complaint  Patient presents with   Chest Pain   Brief narrative: [name] [name] is a 60 y.o. female with PMH significant for HTN, HLD, current smoker, COPD, CVA, CAD s/p stents in 2001, GERD, history of PE. For last several weeks, patient was experiencing right-sided reproducible intermittent chest pain associated with shortness of breath, nonproductive cough and diaphoresis. On 10/17, patient presented at [facility] for outpatient stress test.  During the stress test, he was noted to be diaphoretic, felt poor and was hence sent to the ED.  In the ED, vital signs were stable. Troponin negative.  EKG did not show any acute changes. He had a mild leukocytosis without fever. Chest x-ray showed chronic bronchitis markings without acute findings. CT chest angio negative for acute PE or thoracic dissection. Echocardiogram with EF 65 to 70%, no wall motion abnormalities. Cardiology was consulted.  Per recommendation, patient was transferred to [facility] for cardiac cath. See below for details  Subjective: Patient was seen and examined this morning.  Pleasant middle-aged Caucasian female.  Looks older for her age. Walking in the room with nurse.  Her blood pressure is down in the 80s but patient is not dizzy, lightheaded or tachycardic.Marland Kitchen  She states her blood pressure is always low.  Hospital course: Chest pain History of CAD with stent 2001 -Cardiac cath on 10/19 did not show any new lesion.  Previously placed stent was widely patent.  -Likely noncardiac nature of chest  pain. -Continue aspirin, Lipitor. -Stable for discharge per cardiology.  Acute COPD exacerbation chronic hypoxemic respiratory failure -Uses 3 L nasal cannula O2 at baseline -Currently on azithromycin, prednisone, Dulera, duoneb   Hypotension -Blood pressure in 80s overnight.  Chronic hypertension.  Not symptomatic with lightheadedness, tachycardia or fatigue. -No evidence of infection.   Leukocytosis -In setting of chronic smoking, steroids.  No evidence of infection. Recent Labs  Lab 04/16/21 0913 04/17/21 0702 04/18/21 0602 04/19/21 0320  WBC 16.4* 11.8* 14.8* 13.5*   Prediabetes -A1c 5.9   Hyperlipidemia -Continue Lipitor  Chronic pain syndrome/anxiety -Continue Subutex, trazodone, Zanaflex as needed, Seroquel, Atarax as needed, gabapentin  GERD -PPI   Allergies as of 04/19/2021       Reactions   Codeine    Hydrocodone    Sulfa Antibiotics    Hydrocodone-acetaminophen Hives, Nausea And Vomiting, Anxiety        Medication List     STOP taking these medications    azithromycin 250 MG tablet Commonly known as: ZITHROMAX   Breztri Aerosphere 160-9-4.8 MCG/ACT Aero Generic drug: Budeson-Glycopyrrol-Formoterol   ipratropium-albuterol 0.5-2.5 (3) MG/3ML Soln Commonly known as: DUONEB   montelukast 10 MG tablet Commonly known as: Singulair   predniSONE 20 MG tablet Commonly known as: DELTASONE   sertraline 50 MG tablet Commonly known as: ZOLOFT       TAKE these medications    albuterol 108 (90 Base) MCG/ACT inhaler Commonly known as: VENTOLIN HFA Inhale 2 puffs into the lungs every 6 (six) hours as needed for shortness of breath. What changed: Another medication with the same name was removed. Continue taking this  medication, and follow the directions you see here.   aspirin 81 MG EC tablet Take 1 tablet (81 mg total) by mouth daily. Swallow whole. Start taking on: April 20, 2021   atorvastatin 40 MG tablet Commonly known as:  LIPITOR Take 1 tablet (40 mg total) by mouth daily.   buprenorphine 8 MG Subl SL tablet Commonly known as: SUBUTEX Place 8 mg under the tongue in the morning, at noon, and at bedtime.   Fluticasone-Salmeterol 250-50 MCG/DOSE Aepb Commonly known as: ADVAIR Inhale 1 puff into the lungs 2 (two) times daily.   gabapentin 300 MG capsule Commonly known as: NEURONTIN Take 600-1,200 mg by mouth See admin instructions. Take 600 mg by mouth four times daily and take 1200 mg at bedtime   guaiFENesin 600 MG 12 hr tablet Commonly known as: MUCINEX Take 1 tablet (600 mg total) by mouth 2 (two) times daily.   hydrOXYzine 50 MG tablet Commonly known as: ATARAX/VISTARIL Take 50 mg by mouth in the morning, at noon, and at bedtime.   Omeprazole 20 MG Tbec Take 20 mg by mouth daily as needed (acid reflux).   QUEtiapine 100 MG tablet Commonly known as: SEROQUEL Take 100 mg by mouth at bedtime.   tiZANidine 4 MG tablet Commonly known as: ZANAFLEX Take 4 mg by mouth every 8 (eight) hours as needed for muscle spasms.   traZODone 100 MG tablet Commonly known as: DESYREL Take 300 mg by mouth at bedtime.        Discharge Instructions:  Diet Recommendation: Cardiac diet   @BRDDSCINSTRUCTIONS @  Follow ups:    Follow-up Information     , PA-C Follow up.   Specialty: Family Medicine Contact information: [address] [place] [phone]         [phone], MD .   Specialty: Cardiology Contact information: [address] [place] [phone]                 Wound care:     Discharge Exam:   Vitals:   04/19/21 0536 04/19/21 0610 04/19/21 0640 04/19/21 0914  BP: (!) 80/50 (!) 83/55 (!) 86/57   Pulse: 70  63   Resp: 17 15 17    Temp: 97.7 F (36.5 C)     TempSrc: Oral     SpO2: 98%  97% 98%  Weight:      Height:        Body mass index is 22.31 kg/m.  General exam: Pleasant, not in distress Skin: No  rashes, lesions or ulcers. HEENT: Atraumatic, normocephalic, no obvious bleeding Lungs: Clear to auscultation bilaterally CVS: Regular rate and rhythm, no murmur GI/Abd soft, nontender, nondistended, bowel sound present CNS: Alert, awake, oriented x3 Psychiatry: Mood appropriate Extremities: No pedal edema, no calf tenderness  Time coordinating discharge: 35 minutes   The results of significant diagnostics from this hospitalization (including imaging, microbiology, ancillary and laboratory) are listed below for reference.    Procedures and Diagnostic Studies:   DG Chest 2 View  Result Date: 04/16/2021 CLINICAL DATA:  Mild chest pain. EXAM: CHEST - 2 VIEW COMPARISON:  None. FINDINGS: Normal mediastinum and cardiac silhouette. Chronic central bronchitic markings. Normal pulmonary vasculature. No effusion, infiltrate, or pneumothorax. IMPRESSION: Chronic bronchitic markings.  No acute findings. Electronically Signed   By: M.D.   On: 04/16/2021 10:08   CT Angio Chest Pulmonary Embolism (PE) W or WO Contrast  Result Date: 04/17/2021 CLINICAL DATA:  COPD, chest pain, shortness of breath, elevated D-dimer, low/intermediate prob PE suspected EXAM: CT ANGIOGRAPHY CHEST WITH CONTRAST TECHNIQUE: Multidetector CT imaging of the chest was performed using the standard protocol during bolus administration of intravenous contrast. Multiplanar CT image reconstructions and MIPs were obtained to evaluate the vascular anatomy. CONTRAST:  OMNIPAQUE IOHEXOL 350 MG/ML SOLN COMPARISON:  04/01/2021 FINDINGS: Cardiovascular: Heart size normal. Trace pericardial fluid. The RV is nondilated. Satisfactory opacification of pulmonary arteries noted, and there is no evidence of pulmonary emboli. Scattered coronary calcifications. Adequate contrast opacification of the thoracic aorta with no evidence of dissection, aneurysm, or stenosis. There is patent brachiocephalic arch anatomy without proximal  stenosis. Calcified atheromatous plaque in the arch and distal descending thoracic segment. Mediastinum/Nodes: No mass or adenopathy. Lungs/Pleura: No pleural effusion. Pulmonary emphysema. Dependent atelectasis posteriorly in both lower lobes. Lungs otherwise clear. Upper Abdomen: No acute findings. Musculoskeletal: Spondylitic changes in the visualized lower cervical spine. No fracture or worrisome bone lesion. Review of the MIP images confirms the above findings. IMPRESSION: 1. Negative for acute PE or thoracic aortic dissection. 2. Coronary and Aortic Atherosclerosis (ICD10-170.0). 3.   Emphysema (ICD10-J43.9). Electronically Signed   By: Corlis Leak M.D.   On: 04/17/2021 12:05   NM Myocar Single W/Spect W/Wall Motion And EF  Result Date: 04/18/2021 Procedure canceled.   ECHOCARDIOGRAM COMPLETE  Result Date: 04/17/2021    ECHOCARDIOGRAM REPORT   Patient Name:   [name] [name] Date of Exam: 04/17/2021 Medical Rec #:  [id number]       Height:       64.0 in Accession #:    [id number]      Weight:       130.0 lb Date of Birth:  08-21-1960      BSA:          1.629 m Patient Age:    59 years        BP:           92/60 mmHg Patient Gender: F               HR:           90 bpm. [facility] Procedure: 2D Echo, Cardiac Doppler and Color Doppler Indications:    Chest pain  History:        Patient has no prior history of Echocardiogram examinations.                 CAD, COPD and Stroke, Signs/Symptoms:Chest Pain; Risk                 Factors:Current Smoker and Dyslipidemia.  Sonographer:    Lavenia Atlas RDCS Referring Phys: [id number] Ellsworth Lennox IMPRESSIONS  1. Left ventricular ejection fraction, by estimation, is 65 to 70%. The left ventricle has normal function. The left ventricle has no regional wall motion abnormalities. Left ventricular diastolic parameters are indeterminate.  2. Right ventricular systolic function is normal. The right ventricular size is normal. There is normal  pulmonary artery systolic pressure. The estimated right ventricular systolic pressure is 34.0 mmHg.  3. The mitral valve is grossly normal. Mild mitral valve regurgitation.  4. The aortic valve is tricuspid. Aortic valve regurgitation is not visualized.  5. The inferior vena cava is normal in size with <50% respiratory variability, suggesting right atrial pressure of 8 mmHg. Comparison(s): No prior Echocardiogram. FINDINGS  Left Ventricle: Left ventricular ejection fraction, by estimation, is 65 to 70%. The left ventricle has normal function.  The left ventricle has no regional wall motion abnormalities. The left ventricular internal cavity size was normal in size. There is  borderline left ventricular hypertrophy. Left ventricular diastolic parameters are indeterminate. Right Ventricle: The right ventricular size is normal. No increase in right ventricular wall thickness. Right ventricular systolic function is normal. There is normal pulmonary artery systolic pressure. The tricuspid regurgitant velocity is 2.55 m/s, and  with an assumed right atrial pressure of 8 mmHg, the estimated right ventricular systolic pressure is 34.0 mmHg. Left Atrium: Left atrial size was normal in size. Right Atrium: Right atrial size was normal in size. Pericardium: There is no evidence of pericardial effusion. Mitral Valve: The mitral valve is grossly normal. Mild mitral valve regurgitation. Tricuspid Valve: The tricuspid valve is grossly normal. Tricuspid valve regurgitation is trivial. Aortic Valve: The aortic valve is tricuspid. Aortic valve regurgitation is not visualized. Pulmonic Valve: The pulmonic valve was grossly normal. Pulmonic valve regurgitation is trivial. Aorta: The aortic root is normal in size and structure. Venous: The inferior vena cava is normal in size with less than 50% respiratory variability, suggesting right atrial pressure of 8 mmHg. IAS/Shunts: The interatrial septum appears to be lipomatous. No atrial level  shunt detected by color flow Doppler.  LEFT VENTRICLE PLAX 2D LVIDd:         4.60 cm     Diastology LVIDs:         2.80 cm     LV e' medial:    7.07 cm/s LV PW:         1.00 cm     LV E/e' medial:  14.7 LV IVS:        1.00 cm     LV e' lateral:   8.70 cm/s LVOT diam:     2.00 cm     LV E/e' lateral: 12.0 LV SV:         82 LV SV Index:   51 LVOT Area:     3.14 cm  LV Volumes (MOD) LV vol d, MOD A4C: 73.4 ml LV vol s, MOD A4C: 13.5 ml LV SV MOD A4C:     73.4 ml RIGHT VENTRICLE RV Basal diam:  2.40 cm RV S prime:     11.60 cm/s TAPSE (M-mode): 2.4 cm LEFT ATRIUM             Index        RIGHT ATRIUM          Index LA diam:        3.50 cm 2.15 cm/m   RA Area:     8.89 cm LA Vol (A2C):   31.5 ml 19.34 ml/m  RA Volume:   17.00 ml 10.44 ml/m LA Vol (A4C):   45.6 ml 27.99 ml/m LA Biplane Vol: 39.9 ml 24.49 ml/m  AORTIC VALVE LVOT Vmax:   113.00 cm/s LVOT Vmean:  77.000 cm/s LVOT VTI:    0.262 m  AORTA Ao Root diam: 2.60 cm MITRAL VALVE                TRICUSPID VALVE MV Area (PHT): 5.66 cm     TR Peak grad:   26.0 mmHg MV Decel Time: 134 msec     TR Vmax:        255.00 cm/s MV E velocity: 104.00 cm/s MV A velocity: 119.00 cm/s  SHUNTS MV E/A ratio:  0.87         Systemic VTI:  0.26 m  Systemic Diam: 2.00 cm Nona Dell MD Electronically signed by Nona Dell MD Signature Date/Time: 04/17/2021/2:27:09 PM    Final      Labs:   Basic Metabolic Panel: Recent Labs  Lab 04/16/21 0913 04/17/21 0702 04/18/21 0602 04/19/21 0320  NA 137 138 137 137  K 3.6 3.7 4.1 3.6  CL 103 106 104 105  CO2 27 26 28 27   GLUCOSE 100* 105* 150* 101*  BUN 10 11 13 16   CREATININE 0.64 0.63 0.60 0.60  CALCIUM 9.0 8.2* 8.5* 8.3*   GFR Estimated Creatinine Clearance: 63.8 mL/min (by C-G formula based on SCr of 0.6 mg/dL). Liver Function Tests: No results for input(s): AST, ALT, ALKPHOS, BILITOT, PROT, ALBUMIN in the last 168 hours. No results for input(s): LIPASE, AMYLASE in the last 168  hours. No results for input(s): AMMONIA in the last 168 hours. Coagulation profile No results for input(s): INR, PROTIME in the last 168 hours.  CBC: Recent Labs  Lab 04/16/21 0913 04/17/21 0702 04/18/21 0602 04/19/21 0320  WBC 16.4* 11.8* 14.8* 13.5*  NEUTROABS 12.3* 10.0* 12.9* 10.5*  HGB 13.8 11.5* 11.1* 10.3*  HCT 41.4 35.2* 34.4* 31.7*  MCV 87.2 88.7 89.4 88.8  PLT 345 267 253 221   Cardiac Enzymes: No results for input(s): CKTOTAL, CKMB, CKMBINDEX, TROPONINI in the last 168 hours. BNP: Invalid input(s): POCBNP CBG: No results for input(s): GLUCAP in the last 168 hours. D-Dimer No results for input(s): DDIMER in the last 72 hours. Hgb A1c Recent Labs    04/17/21 0702  HGBA1C 5.9*   Lipid Profile Recent Labs    04/17/21 0702  CHOL 105  106  HDL 45  46  LDLCALC 40  41  TRIG 99  97  CHOLHDL 2.3  2.3   Thyroid function studies Recent Labs    04/17/21 0702  TSH 0.715   Anemia work up No results for input(s): VITAMINB12, FOLATE, FERRITIN, TIBC, IRON, RETICCTPCT in the last 72 hours. Microbiology Recent Results (from the past 240 hour(s))  Resp Panel by RT-PCR (Flu A&B, Covid) Nasopharyngeal Swab     Status: None   Collection Time: 04/16/21  9:32 AM   Specimen: Nasopharyngeal Swab; Nasopharyngeal(NP) swabs in vial transport medium  Result Value Ref Range Status   SARS Coronavirus 2 by RT PCR NEGATIVE NEGATIVE Final    Comment: (NOTE) SARS-CoV-2 target nucleic acids are NOT DETECTED.  The SARS-CoV-2 RNA is generally detectable in upper respiratory specimens during the acute phase of infection. The lowest concentration of SARS-CoV-2 viral copies this assay can detect is 138 copies/mL. A negative result does not preclude SARS-Cov-2 infection and should not be used as the sole basis for treatment or other patient management decisions. A negative result may occur with  improper specimen collection/handling, submission of specimen other than  nasopharyngeal swab, presence of viral mutation(s) within the areas targeted by this assay, and inadequate number of viral copies(<138 copies/mL). A negative result must be combined with clinical observations, patient history, and epidemiological information. The expected result is Negative.  Fact Sheet for Patients:  04/19/21  Fact Sheet for Healthcare Providers:  04/18/21  This test is no t yet approved or cleared by the [employer] FDA and  has been authorized for detection and/or diagnosis of SARS-CoV-2 by FDA under an Emergency Use Authorization (EUA). This EUA will remain  in effect (meaning this test can be used) for the duration of the COVID-19 declaration under Section 564(b)(1) of the Act, 21 U.S.C.section 360bbb-3(b)(1), unless the  authorization is terminated  or revoked sooner.       Influenza A by PCR NEGATIVE NEGATIVE Final   Influenza B by PCR NEGATIVE NEGATIVE Final    Comment: (NOTE) The Xpert Xpress SARS-CoV-2/FLU/RSV plus assay is intended as an aid in the diagnosis of influenza from Nasopharyngeal swab specimens and should not be used as a sole basis for treatment. Nasal washings and aspirates are unacceptable for Xpert Xpress SARS-CoV-2/FLU/RSV testing.  Fact Sheet for Patients: [employer]  Fact Sheet for Healthcare Providers: [employer]it  This test is not yet approved or cleared by the [place] FDA and has been authorized for detection and/or diagnosis of SARS-CoV-2 by FDA under an Emergency Use Authorization (EUA). This EUA will remain in effect (meaning this test can be used) for the duration of the COVID-19 declaration under Section 564(b)(1) of the Act, 21 U.S.C. section 360bbb-3(b)(1), unless the authorization is terminated or revoked.  Performed at [facility], [address], [place], [place] [address]       Signed: Lorin Glass  [facility] 04/19/2021, 11:22 AM

## 2021-04-20 MED FILL — Lidocaine HCl Local Preservative Free (PF) Inj 1%: INTRAMUSCULAR | Qty: 30 | Status: AC

## 2021-05-14 ENCOUNTER — Ambulatory Visit (HOSPITAL_COMMUNITY): Admission: RE | Admit: 2021-05-14 | Payer: Medicare Other | Source: Ambulatory Visit

## 2021-05-28 ENCOUNTER — Ambulatory Visit: Payer: Medicare Other | Admitting: Internal Medicine

## 2021-05-28 NOTE — Progress Notes (Deleted)
[name] [name], female    DOB: 1960-07-26    MRN: [id number]   Brief patient profile:  60   yo***  *** referred to pulmonary clinic in [facility]  05/28/2021 by Dr Isaiah Serge   for GOLD 0 copd, marked Eos           History of Present Illness  05/28/2021  Pulmonary/ 1st office eval/ Pattijo Juste / [employer]  No chief complaint on file.    Dyspnea:  *** Cough: *** Sleep: *** SABA use:   Past Medical History:  Diagnosis Date   Acid reflux    CAD (coronary artery disease)    S/P cath with stent in 2001   CHF (congestive heart failure) (HCC)    Collagen vascular disease (HCC)    COPD (chronic obstructive pulmonary disease) (HCC)    CVA (cerebral vascular accident) (HCC) 1992   DU (duodenal ulcer) 09/19/2009   Hiatal hernia    EGD september 2007. normal except for small hiatal hernia. She underwent small bowel biopsy with history of diarrhea at that time. This was negative   History of colonoscopy    August 2007. She had a pedunculated polyp at 30 cm. which was hamartomatous polyp. She  is due for 5-year followup with family history of coln cancer in a brother at age 55   History of colonoscopy    2005 revealed a linear ulcer with scar versus inflammatory process at the mid right colon, but normal terminal ileum. A biopsy of this area revealed an ulceration, but nonspecific   History of coronary angiogram    CT angiogram in 2007. negative with normal mesenteric arteries   History of kidney stones    Migraine    Pulmonary embolus (HCC) 1999   Ulcerative colitis (HCC)    Urinary frequency     Outpatient Medications Prior to Visit  Medication Sig Dispense Refill   albuterol (PROVENTIL HFA;VENTOLIN HFA) 108 (90 BASE) MCG/ACT inhaler Inhale 2 puffs into the lungs every 6 (six) hours as needed for shortness of breath.      aspirin EC 81 MG EC tablet Take 1 tablet (81 mg total) by mouth daily. Swallow whole. 30 tablet 2   atorvastatin (LIPITOR) 40 MG tablet Take 1 tablet (40 mg  total) by mouth daily. 30 tablet 2   buprenorphine (SUBUTEX) 8 MG SUBL SL tablet Place 8 mg under the tongue in the morning, at noon, and at bedtime.     Fluticasone-Salmeterol (ADVAIR) 250-50 MCG/DOSE AEPB Inhale 1 puff into the lungs 2 (two) times daily.     gabapentin (NEURONTIN) 300 MG capsule Take 600-1,200 mg by mouth See admin instructions. Take 600 mg by mouth four times daily and take 1200 mg at bedtime     guaiFENesin (MUCINEX) 600 MG 12 hr tablet Take 1 tablet (600 mg total) by mouth 2 (two) times daily. 12 tablet 0   hydrOXYzine (ATARAX/VISTARIL) 50 MG tablet Take 50 mg by mouth in the morning, at noon, and at bedtime.     Omeprazole 20 MG TBEC Take 20 mg by mouth daily as needed (acid reflux).     QUEtiapine (SEROQUEL) 100 MG tablet Take 100 mg by mouth at bedtime.     tiZANidine (ZANAFLEX) 4 MG tablet Take 4 mg by mouth every 8 (eight) hours as needed for muscle spasms.     traZODone (DESYREL) 100 MG tablet Take 300 mg by mouth at bedtime.     No facility-administered medications prior to visit.  Objective:     There were no vitals taken for this visit.         Assessment   No problem-specific Assessment & Plan notes found for this encounter.     Christinia Gully, MD 05/28/2021

## 2021-06-11 ENCOUNTER — Ambulatory Visit (INDEPENDENT_AMBULATORY_CARE_PROVIDER_SITE_OTHER): Payer: Medicare Other | Admitting: Cardiology

## 2021-06-11 ENCOUNTER — Encounter: Payer: Self-pay | Admitting: Cardiology

## 2021-06-11 VITALS — BP 148/98 | HR 76 | Ht 64.0 in | Wt 124.0 lb

## 2021-06-11 DIAGNOSIS — R030 Elevated blood-pressure reading, without diagnosis of hypertension: Secondary | ICD-10-CM | POA: Diagnosis not present

## 2021-06-11 DIAGNOSIS — I2510 Atherosclerotic heart disease of native coronary artery without angina pectoris: Secondary | ICD-10-CM | POA: Diagnosis not present

## 2021-06-11 DIAGNOSIS — Z23 Encounter for immunization: Secondary | ICD-10-CM

## 2021-06-11 DIAGNOSIS — E782 Mixed hyperlipidemia: Secondary | ICD-10-CM

## 2021-06-11 NOTE — Patient Instructions (Signed)

## 2021-06-11 NOTE — Progress Notes (Signed)
Clinical Summary [name] [name] is a 60 y.o.female seen today for follow up of the following medical problems.    1. CAD - from notes history of prior stent in 2001. Last cah 2010 with patient coronaries and stent.  - recent chest pain. Pulling like feeling/burning mid to right chest, 7-8/10. Can occur at rest or with exertion. Can get very diaphoretic. Not positional. Pain lasts a few minutes, 4 episodes over the last month - highest level of activity is housework, notes low energy keeps her from her regular activities - not on aspirin. STopped several meds due to lack of insurance.     03/2021 cath: patent vessels and LCX stent.  03/2021 echo: LVEF 65-70%, no WMAs, indet diastolic, mild MR - no recent symptoms.       2. History of CVA - no recent symptoms.    3. Hyperlipidemia 03/2021 TC 105 TG 99 HDL 45 LDL 40   4. COPD  5. Elevated bp - prior visits 100s/60s, today manual 140/90 Past Medical History:  Diagnosis Date   Acid reflux    CAD (coronary artery disease)    S/P cath with stent in 2001   CHF (congestive heart failure) (HCC)    Collagen vascular disease ([place])    COPD (chronic obstructive pulmonary disease) ([place])    CVA (cerebral vascular accident) ([place]) 1992   DU (duodenal ulcer) 09/19/2009   Hiatal hernia    EGD september 2007. normal except for small hiatal hernia. She underwent small bowel biopsy with history of diarrhea at that time. This was negative   History of colonoscopy    August 2007. She had a pedunculated polyp at 30 cm. which was hamartomatous polyp. She  is due for 5-year followup with family history of coln cancer in a brother at age 32   History of colonoscopy    2005 revealed a linear ulcer with scar versus inflammatory process at the mid right colon, but normal terminal ileum. A biopsy of this area revealed an ulceration, but nonspecific   History of coronary angiogram    CT angiogram in 2007. negative with normal mesenteric arteries    History of kidney stones    Migraine    Pulmonary embolus (HCC) 1999   Ulcerative colitis (HCC)    Urinary frequency      Allergies  Allergen Reactions   Codeine    Hydrocodone    Sulfa Antibiotics    Hydrocodone-Acetaminophen Hives, Nausea And Vomiting and Anxiety     Current Outpatient Medications  Medication Sig Dispense Refill   albuterol (PROVENTIL HFA;VENTOLIN HFA) 108 (90 BASE) MCG/ACT inhaler Inhale 2 puffs into the lungs every 6 (six) hours as needed for shortness of breath.      aspirin EC 81 MG EC tablet Take 1 tablet (81 mg total) by mouth daily. Swallow whole. 30 tablet 2   atorvastatin (LIPITOR) 40 MG tablet Take 1 tablet (40 mg total) by mouth daily. 30 tablet 2   buprenorphine (SUBUTEX) 8 MG SUBL SL tablet Place 8 mg under the tongue in the morning, at noon, and at bedtime.     Fluticasone-Salmeterol (ADVAIR) 250-50 MCG/DOSE AEPB Inhale 1 puff into the lungs 2 (two) times daily.     gabapentin (NEURONTIN) 300 MG capsule Take 600-1,200 mg by mouth See admin instructions. Take 600 mg by mouth four times daily and take 1200 mg at bedtime     guaiFENesin (MUCINEX) 600 MG 12 hr tablet Take 1 tablet (600 mg total)  by mouth 2 (two) times daily. 12 tablet 0   hydrOXYzine (ATARAX/VISTARIL) 50 MG tablet Take 50 mg by mouth in the morning, at noon, and at bedtime.     Omeprazole 20 MG TBEC Take 20 mg by mouth daily as needed (acid reflux).     QUEtiapine (SEROQUEL) 100 MG tablet Take 100 mg by mouth at bedtime.     tiZANidine (ZANAFLEX) 4 MG tablet Take 4 mg by mouth every 8 (eight) hours as needed for muscle spasms.     traZODone (DESYREL) 100 MG tablet Take 300 mg by mouth at bedtime.     No current facility-administered medications for this visit.     Past Surgical History:  Procedure Laterality Date   ABDOMINAL HYSTERECTOMY     APPENDECTOMY     CESAREAN SECTION     CHOLECYSTECTOMY     COLONOSCOPY  01/2006   Rourk-pedunculated polyp 30 cm hamartomatous polyp,    COLONOSCOPY  2005   Lineal ulcer or mid right colon, normal TI nonspecific biopsy   CORONARY ANGIOPLASTY WITH STENT PLACEMENT  2011   Dr. Velora Heckler, [place]   ESOPHAGOGASTRODUODENOSCOPY  09/19/09   Rourk -2 duodenal bulbar ulcers, small HH otherwise normal/tiny distal esophageal erosions with mild erosive reflux   ESOPHAGOGASTRODUODENOSCOPY  03/2006   Rourk-Hiatal hernia, negative small bowel biopsy   LEFT HEART CATH AND CORONARY ANGIOGRAPHY N/A 04/18/2021   Procedure: LEFT HEART CATH AND CORONARY ANGIOGRAPHY;  Surgeon: Early Osmond, MD;  Location: [facility];  Service: Cardiovascular;  Laterality: N/A;   TONSILLECTOMY     Unilateral oophorectomy with hysterectomy       Allergies  Allergen Reactions   Codeine    Hydrocodone    Sulfa Antibiotics    Hydrocodone-Acetaminophen Hives, Nausea And Vomiting and Anxiety      Family History  Problem Relation Age of Onset   Colon cancer Brother 75       died 1 year later   Heart attack Mother 52   Diabetes Sister      Social History Ms. Froese reports that she has been smoking cigarettes. She has a 35.00 pack-year smoking history. She has never used smokeless tobacco. Ms. Shambaugh reports no history of alcohol use.   Review of Systems CONSTITUTIONAL: No weight loss, fever, chills, weakness or fatigue.  HEENT: Eyes: No visual loss, blurred vision, double vision or yellow sclerae.No hearing loss, sneezing, congestion, runny nose or sore throat.  SKIN: No rash or itching.  CARDIOVASCULAR: per hpi RESPIRATORY: No shortness of breath, cough or sputum.  GASTROINTESTINAL: No anorexia, nausea, vomiting or diarrhea. No abdominal pain or blood.  GENITOURINARY: No burning on urination, no polyuria NEUROLOGICAL: No headache, dizziness, syncope, paralysis, ataxia, numbness or tingling in the extremities. No change in bowel or bladder control.  MUSCULOSKELETAL: No muscle, back pain, joint pain or stiffness.  LYMPHATICS: No enlarged nodes. No  history of splenectomy.  PSYCHIATRIC: No history of depression or anxiety.  ENDOCRINOLOGIC: No reports of sweating, cold or heat intolerance. No polyuria or polydipsia.  Marland Kitchen   Physical Examination Today's Vitals   06/11/21 1418  BP: (!) 148/98  Pulse: 76  SpO2: 97%  Weight: 124 lb (56.2 kg)  Height: 5\' 4"  (1.626 m)   Body mass index is 21.28 kg/m.  Gen: resting comfortably, no acute distress HEENT: no scleral icterus, pupils equal round and reactive, no palptable cervical adenopathy,  CV: RRR, no m/r/g no jvd Resp: Clear to auscultation bilaterally GI: abdomen is soft, non-tender, non-distended,  normal bowel sounds, no hepatosplenomegaly MSK: extremities are warm, no edema.  Skin: warm, no rash Neuro:  no focal deficits Psych: appropriate affect   Diagnostic Studies 05/2000 cath RESULTS:   Hemodynamics:  Left ventricular pressure 126/20, aortic pressure 118/74. There is no aortic valve gradient.   Left ventriculogram:  There is moderate hypokinesis of the inferior wall and moderate hypokinesis of the posterolateral wall.  Ejection fraction calculated at 59%.  There is 2+ mild mitral regurgitation.   Coronary arteriography codominant): 1. The left main is normal. 2. The left anterior descending artery has a 30% stenosis in the mid vessel.    The LAD gives rise to a small first diagonal, normal second diagonal, and a    small third diagonal. 3. The left circumflex is a relatively large codominant vessel.  It gives    rise to a large first marginal, a large second marginal, and a small    posterolateral Jeorge Reister.  There is a stent in the mid to distal circumflex    which extends into the second marginal Najiyah Paris.  There is a diffuse 20%    stenosis within the stent. 4. The right coronary artery is a relatively small codominant vessel.  There    is a 20% stenosis in the mid vessel.  The distal right coronary artery    gives rise to a normal-sized acute marginal which  supplies a portion of    the inferior septum and a small posterior descending artery.   IMPRESSIONS: 1. Left ventricular systolic function in lower range of normal with wall    motion abnormalities as described and mild mitral regurgitation. 2. Mild nonobstructive coronary artery disease with a patent stent in the    left circumflex.   08/2008 cath ANGIOGRAPHIC FINDINGS:  1. Left main coronary artery bifurcated into the circumflex and LAD      and had no significant disease noted.  2. The left anterior descending is a large vessel that courses to the      apex and gives rise to a first small diagonal Danaya Geddis, a normal      second diagonal Namiyah Grantham, and a small third diagonal Maui Ahart.  There      was a 20% stenosis noted in the midportion of the LAD.  3. The left circumflex is a relatively large codominant vessel that      gives rise to a large first marginal Jeani Fassnacht, a large second      marginal Triston Lisanti, and a small posterolateral Juanya Villavicencio.  There is a      stent in the mid to distal circumflex which extends into the second      marginal Breland Trouten.  There is no significant in-stent stenosis noted.      There is a 30% stenosis noted prior to the stented segment in the      midportion of the circumflex.  4. The right coronary artery is a small to moderate size codominant      vessel that has serial 30% lesions in the midportion and gives rise      to a small posterior descending artery.  5. Left ventricular angiogram was performed in the RAO projection and      showed normal left ventricular systolic function with no wall      motion abnormalities.  Ejection fraction was estimated at 55%.    HEMODYNAMIC DATA:  Central aortic pressure 84/50.  Left ventricular  pressure 87/5.  End-diastolic pressure 11.    IMPRESSION:  1.  Noncardiac etiology of chest pain.  2. Stable single-vessel coronary artery disease with patent stented      segment in the mid to distal circumflex coronary artery.  3. Mild  nonobstructive disease in the right coronary artery and the      left anterior descending coronary artery.  4. Normal left ventricular systolic function.    03/2021 cath  Prox RCA lesion is 20% stenosed.   Mid Cx lesion is 10% stenosed.   LV end diastolic pressure is normal.   1.  Widely patent mid left circumflex stent with mild disease elsewhere. 2.  Normal LV function with LVEDP of 17 mmHg.   03/2021 echo IMPRESSIONS     1. Left ventricular ejection fraction, by estimation, is 65 to 70%. The  left ventricle has normal function. The left ventricle has no regional  wall motion abnormalities. Left ventricular diastolic parameters are  indeterminate.   2. Right ventricular systolic function is normal. The right ventricular  size is normal. There is normal pulmonary artery systolic pressure. The  estimated right ventricular systolic pressure is [id number] mmHg.   3. The mitral valve is grossly normal. Mild mitral valve regurgitation.   4. The aortic valve is tricuspid. Aortic valve regurgitation is not  visualized.   5. The inferior vena cava is normal in size with <50% respiratory  variability, suggesting right atrial pressure of 8 mmHg.     Assessment and Plan  1. CAD - recent cath with patents vessels and stent - no recent symptoms, continue current meds     2. Elevated bp - isoalted elevated bp today, manutal recheck 140/90. At ER visit just last week she was 100s/60s, last cards visit 98/60 - monitor at this time, there is not an incidation for meds currently  3. Hyperlipidemia - at goal, continue atorvastatin.     Arnoldo Lenis, M.D.

## 2021-07-09 ENCOUNTER — Ambulatory Visit: Payer: Medicare Other | Admitting: Internal Medicine

## 2021-07-09 NOTE — Progress Notes (Deleted)
[name] [name], female    DOB: July 19, 1960   MRN: [id number]   Brief patient profile:  ***  yo*** *** referred to pulmonary clinic in [place]  07/09/2021 by *** for ***      History of Present Illness  07/09/2021  Pulmonary/ 1st office eval/ Melvyn Novas / [facility] [place] [facility]  No chief complaint on file.    Dyspnea:  *** Cough: *** Sleep: *** SABA use:   Past Medical History:  Diagnosis Date   Acid reflux    CAD (coronary artery disease)    S/P cath with stent in 2001   CHF (congestive heart failure) (HCC)    Collagen vascular disease ([place])    COPD (chronic obstructive pulmonary disease) ([place])    CVA (cerebral vascular accident) ([place]) 1992   DU (duodenal ulcer) 09/19/2009   Hiatal hernia    EGD september 2007. normal except for small hiatal hernia. She underwent small bowel biopsy with history of diarrhea at that time. This was negative   History of colonoscopy    August 2007. She had a pedunculated polyp at 30 cm. which was hamartomatous polyp. She  is due for 5-year followup with family history of coln cancer in a brother at age 7   History of colonoscopy    2005 revealed a linear ulcer with scar versus inflammatory process at the mid right colon, but normal terminal ileum. A biopsy of this area revealed an ulceration, but nonspecific   History of coronary angiogram    CT angiogram in 2007. negative with normal mesenteric arteries   History of kidney stones    Migraine    Pulmonary embolus (HCC) 1999   Ulcerative colitis ([place])    Urinary frequency     Outpatient Medications Prior to Visit  Medication Sig Dispense Refill   albuterol (PROVENTIL HFA;VENTOLIN HFA) 108 (90 BASE) MCG/ACT inhaler Inhale 2 puffs into the lungs every 6 (six) hours as needed for shortness of breath.      aspirin EC 81 MG EC tablet Take 1 tablet (81 mg total) by mouth daily. Swallow whole. 30 tablet 2   atorvastatin (LIPITOR) 40 MG tablet Take 1 tablet (40 mg total) by mouth daily. 30 tablet 2    buprenorphine (SUBUTEX) 8 MG SUBL SL tablet Place 8 mg under the tongue in the morning, at noon, and at bedtime.     Fluticasone-Salmeterol (ADVAIR) 250-50 MCG/DOSE AEPB Inhale 1 puff into the lungs 2 (two) times daily.     gabapentin (NEURONTIN) 300 MG capsule Take 600-1,200 mg by mouth See admin instructions. Take 600 mg by mouth four times daily and take 1200 mg at bedtime     guaiFENesin (MUCINEX) 600 MG 12 hr tablet Take 1 tablet (600 mg total) by mouth 2 (two) times daily. 12 tablet 0   hydrOXYzine (ATARAX/VISTARIL) 50 MG tablet Take 50 mg by mouth in the morning, at noon, and at bedtime.     Omeprazole 20 MG TBEC Take 20 mg by mouth daily as needed (acid reflux).     QUEtiapine (SEROQUEL) 100 MG tablet Take 100 mg by mouth at bedtime.     tiZANidine (ZANAFLEX) 4 MG tablet Take 4 mg by mouth every 8 (eight) hours as needed for muscle spasms.     traZODone (DESYREL) 100 MG tablet Take 300 mg by mouth at bedtime.     No facility-administered medications prior to visit.     Objective:     There were no vitals taken for this visit.  Assessment   No problem-specific Assessment & Plan notes found for this encounter.     Christinia Gully, MD 07/09/2021

## 2021-07-10 ENCOUNTER — Encounter: Payer: Self-pay | Admitting: Internal Medicine

## 2021-07-10 ENCOUNTER — Ambulatory Visit (INDEPENDENT_AMBULATORY_CARE_PROVIDER_SITE_OTHER): Payer: Commercial Managed Care - HMO | Admitting: Internal Medicine

## 2021-07-10 ENCOUNTER — Other Ambulatory Visit: Payer: Self-pay

## 2021-07-10 DIAGNOSIS — J449 Chronic obstructive pulmonary disease, unspecified: Secondary | ICD-10-CM | POA: Diagnosis not present

## 2021-07-10 DIAGNOSIS — F17210 Nicotine dependence, cigarettes, uncomplicated: Secondary | ICD-10-CM

## 2021-07-10 DIAGNOSIS — G4734 Idiopathic sleep related nonobstructive alveolar hypoventilation: Secondary | ICD-10-CM | POA: Diagnosis not present

## 2021-07-10 MED ORDER — AMOXICILLIN-POT CLAVULANATE 875-125 MG PO TABS: 1.0000 | ORAL_TABLET | Freq: Two times a day (BID) | ORAL | 0 refills | Status: AC

## 2021-07-10 MED ORDER — ALBUTEROL SULFATE (2.5 MG/3ML) 0.083% IN NEBU: 2.5000 mg | INHALATION_SOLUTION | RESPIRATORY_TRACT | Status: DC | PRN

## 2021-07-10 MED ORDER — PANTOPRAZOLE SODIUM 40 MG PO TBEC: 40.0000 mg | DELAYED_RELEASE_TABLET | Freq: Every day | ORAL | 2 refills | Status: DC

## 2021-07-10 MED ORDER — PREDNISONE 10 MG PO TABS: ORAL_TABLET | ORAL | 0 refills | Status: DC

## 2021-07-10 MED ORDER — BUDESONIDE-FORMOTEROL FUMARATE 80-4.5 MCG/ACT IN AERO: INHALATION_SPRAY | RESPIRATORY_TRACT | 12 refills | Status: DC

## 2021-07-10 MED ORDER — FAMOTIDINE 20 MG PO TABS: ORAL_TABLET | ORAL | 11 refills | Status: DC

## 2021-07-10 NOTE — Patient Instructions (Addendum)
Plan A = Automatic = Always=    Symbicort 80 Take 2 puffs first thing in am and then another 2 puffs about 12 hours later.    Work on inhaler technique:  relax and gently blow all the way out then take a nice smooth full deep breath back in, triggering the inhaler at same time you start breathing in.  Hold for up to 5 seconds if you can. Blow out thru nose. Rinse and gargle with water when done.  If mouth or throat bother you at all,  try brushing teeth/gums/tongue with arm and hammer toothpaste/ make a slurry and gargle and spit out.     Plan B = Backup (to supplement plan A, not to replace it) Only use your albuterol inhaler as a rescue medication to be used if you can't catch your breath by resting or doing a relaxed purse lip breathing pattern.  - The less you use it, the better it will work when you need it. - Ok to use the inhaler up to 2 puffs  every 4 hours if you must but call for appointment if use goes up over your usual need - Don't leave home without it !!  (think of it like the spare tire for your car)   Plan C = Crisis (instead of Plan B but only if Plan B stops working) - only use your albuterol nebulizer if you first try Plan B and it fails to help > ok to use the nebulizer up to every 4 hours but if start needing it regularly call for immediate appointment   For cough /congestion > mucinex dm 1200 mg twice daily = 2400 mg total per 24 hours  Prednisone 10 mg take  4 each am x 2 days,   2 each am x 2 days,  1 each am x 2 days and stop    Augmentin 875 mg take one pill twice daily  X 10 days - take at breakfast and supper with large glass of water.  It would help reduce the usual side effects (diarrhea and yeast infections) if you ate cultured yogurt at lunch.   Pantoprazole (protonix) 40 mg   Take  30-60 min before first meal of the day and Pepcid (famotidine)  20 mg after supper until return to office - this is the best way to tell whether stomach acid is contributing to your  problem.    GERD (REFLUX)  is an extremely common cause of respiratory symptoms just like yours , many times with no obvious heartburn at all.    It can be treated with medication, but also with lifestyle changes including elevation of the head of your bed (ideally with 6-8inch blocks under the headboard of your bed),  Smoking cessation, avoidance of late meals, excessive alcohol, and avoid fatty foods, chocolate, peppermint, colas, red wine, and acidic juices such as orange juice.  NO MINT OR MENTHOL PRODUCTS SO NO COUGH DROPS  USE SUGARLESS CANDY INSTEAD (Jolley ranchers or Stover's or Life Savers) or even ice chips will also do - the key is to swallow to prevent all throat clearing. NO OIL BASED VITAMINS - use powdered substitutes.  Avoid fish oil when coughing.   The key is to stop smoking completely before smoking completely stops you!  For smoking cessation classes call [phone]      Please schedule a follow up office visit in 6 weeks, call sooner if needed with all medications /inhalers/ solutions in hand so we can  verify exactly what you are taking. This includes all medications from all doctors and over the counters

## 2021-07-10 NOTE — Progress Notes (Signed)
SENORA [name], female    DOB: [birth year]   MRN: [id number]   Brief patient profile:  61  yowf active smoker  referred to pulmonary clinic in [place]  07/10/2021 by Juanetta Gosling PA for copd with symptoms starting around 2012  prev eval by Dr Isaiah Serge with GOLD 0 critieria/ AB phenotype  but requested office closer to home      History of Present Illness  07/10/2021  Pulmonary/ 1st office eval/ [facility]  on advair 250 Chief Complaint  Patient presents with   New Patient (Initial Visit)    Patient here for copd and emphysema. Ref by cardiologist. States she is using 4LO2 cont. At night all night and prn during day. Coughing up green/brown mucus.   Dyspnea:  [employer] shopping / no HC parking / some limited by neck from houswork Cough: every night at hs after lie down and also while asleep x one year / augmentin best also helps nasal congestion/ coughs so hard gets fainty Sleep: bed is flat with pillows x 40 degrees with overt hb / on 02  SABA use: twice daily hfa/ neb once every 2 weeks  No obvious day to day or daytime variability or assoc excess/ purulent sputum or mucus plugs or hemoptysis or cp or chest tightness.   Also denies any obvious fluctuation of symptoms with weather or environmental changes or other aggravating or alleviating factors except as outlined above   No unusual exposure hx or h/o childhood pna/ asthma or knowledge of premature birth.  Current Allergies, Complete Past Medical History, Past Surgical History, Family History, and Social History were reviewed in [employer] record.  ROS  The following are not active complaints unless bolded Hoarseness, sore throat, dysphagia, dental problems, itching, sneezing,  nasal congestion or discharge of excess mucus or purulent secretions, ear ache,   fever, chills, sweats, unintended wt loss or wt gain, classically pleuritic or exertional cp,  orthopnea pnd or arm/hand swelling  or leg  swelling, presyncope, palpitations, abdominal pain, anorexia, nausea, vomiting, diarrhea  or change in bowel habits or change in bladder habits, change in stools or change in urine, dysuria, hematuria,  rash, arthralgias, visual complaints, headache, numbness, weakness or ataxia or problems with walking or coordination,  change in mood or  memory.           Past Medical History:  Diagnosis Date   Acid reflux    CAD (coronary artery disease)    S/P cath with stent in 2001   CHF (congestive heart failure) (HCC)    Collagen vascular disease (HCC)    COPD (chronic obstructive pulmonary disease) (HCC)    CVA (cerebral vascular accident) (HCC) 1992   DU (duodenal ulcer) 09/19/2009   Hiatal hernia    EGD september 2007. normal except for small hiatal hernia. She underwent small bowel biopsy with history of diarrhea at that time. This was negative   History of colonoscopy    August 2007. She had a pedunculated polyp at 30 cm. which was hamartomatous polyp. She  is due for 5-year followup with family history of coln cancer in a brother at age 4   History of colonoscopy    2005 revealed a linear ulcer with scar versus inflammatory process at the mid right colon, but normal terminal ileum. A biopsy of this area revealed an ulceration, but nonspecific   History of coronary angiogram    CT angiogram in 2007. negative with normal mesenteric arteries  History of kidney stones    Migraine    Pulmonary embolus (HCC) 1999   Ulcerative colitis ([place])    Urinary frequency     Outpatient Medications Prior to Visit  Medication Sig Dispense Refill   albuterol (PROVENTIL HFA;VENTOLIN HFA) 108 (90 BASE) MCG/ACT inhaler Inhale 2 puffs into the lungs every 6 (six) hours as needed for shortness of breath.      aspirin EC 81 MG EC tablet Take 1 tablet (81 mg total) by mouth daily. Swallow whole. 30 tablet 2   atorvastatin (LIPITOR) 40 MG tablet Take 1 tablet (40 mg total) by mouth daily. 30 tablet 2    buprenorphine (SUBUTEX) 8 MG SUBL SL tablet Place 8 mg under the tongue in the morning, at noon, and at bedtime.     Fluticasone-Salmeterol (ADVAIR) 250-50 MCG/DOSE AEPB Inhale 1 puff into the lungs 2 (two) times daily.     gabapentin (NEURONTIN) 300 MG capsule Take 600-1,200 mg by mouth See admin instructions. Take 600 mg by mouth four times daily and take 1200 mg at bedtime     guaiFENesin (MUCINEX) 600 MG 12 hr tablet Take 1 tablet (600 mg total) by mouth 2 (two) times daily. 12 tablet 0   hydrOXYzine (ATARAX/VISTARIL) 50 MG tablet Take 50 mg by mouth in the morning, at noon, and at bedtime.     Omeprazole 20 MG TBEC Take 20 mg by mouth daily as needed (acid reflux).     QUEtiapine (SEROQUEL) 100 MG tablet Take 100 mg by mouth at bedtime.     traZODone (DESYREL) 100 MG tablet Take 300 mg by mouth at bedtime.     tiZANidine (ZANAFLEX) 4 MG tablet Take 4 mg by mouth every 8 (eight) hours as needed for muscle spasms.     No facility-administered medications prior to visit.     Objective:     BP 118/76 (BP Location: Left Arm, Patient Position: Sitting)    Pulse 77    Temp 98.2 F (36.8 C) (Temporal)    Ht 5\' 4"  (1.626 m)    Wt 122 lb 1.9 oz (55.4 kg)    SpO2 96% Comment: ra   BMI 20.96 kg/m   SpO2: 96 % (ra)  HEENT : pt wearing mask not removed for exam due to covid - 19 concerns.   NECK :  without JVD/Nodes/TM/ nl carotid upstrokes bilaterally   LUNGS: no acc muscle use,  Min barrel  contour chest wall with bilateral  junky exp  wheeze and  with cough  exp maneuvers and min  Hyperresonant  to  percussion bilaterally     CV:  RRR  no s3 or murmur or increase in P2, and no edema   ABD:  soft and nontender with pos end  insp Hoover's  in the supine position. No bruits or organomegaly appreciated, bowel sounds nl  MS:   Nl gait/  ext warm without deformities, calf tenderness, cyanosis or clubbing No obvious joint restrictions   SKIN: warm and dry without lesions    NEURO:  alert,  approp, nl sensorium with  no motor or cerebellar deficits apparent.        I personally reviewed images and agree with radiology impression as follows:   Chest CTa 04/17/21 1. Negative for acute PE or thoracic aortic dissection. 2. Coronary and Aortic Atherosclerosis (ICD10-170.0). 3.   Emphysema (ICD10-J43.9).    Assessment   COPD GOLD 0  Active smoker PFT's  10/05/19 FEV1 (2.10 % ) ratio 0.77  p 2 % improvement from saba p 0 prior to study with DLCO  14.86 (76%) corrects to 3.42 (80%)  for alv volume and FV curve min concavity   - 07/10/2021 try symbicort 80 2bid and max gerd rx   She probably is predominantly AB not technically copd though certainly at risk with emphysema on CT so I would characterize her as COPD GOLD 0 / asthmatic bronchitis phenotype   rx  symbicort 80 2bid Max rx for gerd Augmentin/ pred x 6 and f/u in 6 weeks    Nocturnal hypoxemia On 02 hs since Oct 2022   rec No change noct 02 Make sure you check your oxygen saturation  AT  your highest level of activity (not after you stop)   to be sure it stays over 90% and adjust  02 flow upward to maintain this level if needed but remember to turn it back to previous settings when you stop (to conserve your supply).    Cigarette smoker Counseled re importance of smoking cessation but did not meet time criteria for separate billing          Each maintenance medication was reviewed in detail including emphasizing most importantly the difference between maintenance and prns and under what circumstances the prns are to be triggered using an action plan format where appropriate.  Total time for H and P, chart review, counseling, reviewing hfa/02 device(s) and generating customized AVS unique to this office visit /pt new to me/ same day charting = 41 min       Christinia Gully, MD 07/10/2021

## 2021-07-10 NOTE — Assessment & Plan Note (Signed)
Active smoker PFT's  10/05/19 FEV1 (2.10 % ) ratio 0.77  p 2 % improvement from saba p 0 prior to study with DLCO  14.86 (76%) corrects to 3.42 (80%)  for alv volume and FV curve min concavity   - 07/10/2021 try symbicort 80 2bid and max gerd rx   She probably is predominantly AB not technically copd though certainly at risk with emphysema on CT so I would characterize her as COPD GOLD 0 / asthmatic bronchitis phenotype   rx  symbicort 80 2bid Max rx for gerd Augmentin/ pred x 6 and f/u in 6 weeks

## 2021-07-10 NOTE — Assessment & Plan Note (Addendum)
On 02 hs since Oct 2022   rec No change noct 02 Make sure you check your oxygen saturation  AT  your highest level of activity (not after you stop)   to be sure it stays over 90% and adjust  02 flow upward to maintain this level if needed but remember to turn it back to previous settings when you stop (to conserve your supply).           Each maintenance medication was reviewed in detail including emphasizing most importantly the difference between maintenance and prns and under what circumstances the prns are to be triggered using an action plan format where appropriate.  Total time for H and P, chart review, counseling, reviewing hfa/02 device(s) and generating customized AVS unique to this office visit/pt new to me / same day charting = 41 min

## 2021-07-10 NOTE — Assessment & Plan Note (Signed)
Counseled re importance of smoking cessation but did not meet time criteria for separate billing   °

## 2021-08-10 NOTE — Progress Notes (Signed)
Surgical Instructions    Your procedure is scheduled on Wednesday, February 15th.  Report to [facility] Main Entrance "A" at 9:15 A.M., then check in with the Admitting office.  Call this number if you have problems the morning of surgery:  [phone]   If you have any questions prior to your surgery date call [phone]: Open Monday-Friday 8am-4pm    Remember:  Do not eat after midnight the night before your surgery  You may drink clear liquids until 8:15 AM the morning of your surgery.   Clear liquids allowed are: Water, Non-Citrus Juices (without pulp), Carbonated Beverages, Clear Tea, Black Coffee ONLY (NO MILK, CREAM OR POWDERED CREAMER of any kind), and Gatorade    Take these medicines the morning of surgery with A SIP OF WATER:   Atorvastatin (Lipitor)  Budesonide-formoterol (Symbicort) inhaler  Gabapentin (Neurontin)  Pantoprazole (Protonix)   If needed:  Albuterol inhaler (bring with you on day of surgery)  Albuterol nebulizer  Hydroxyzine (Atarax/Vistaril)  Ondansetron (Zofran)   As of today, STOP taking any Aspirin (unless otherwise instructed by your surgeon) Aleve, Naproxen, Ibuprofen, Motrin, Advil, Goody's, BC's, all herbal medications, fish oil, and all vitamins.           DAY OF SURGERY: Do not wear jewelry or makeup Do not wear lotions, powders, perfumes, or deodorant. Do not shave 48 hours prior to surgery.   Do not bring valuables to the hospital. Do not wear nail polish, gel polish, artificial nails, or any other type of covering on natural nails (fingers and toes) If you have artificial nails or gel coating that need to be removed by a nail salon, please have this removed prior to surgery. Artificial nails or gel coating may interfere with anesthesia's ability to adequately monitor your vital signs.  [facility] is not responsible for any belongings or valuables. .   Do NOT Smoke (Tobacco/Vaping)  24 hours prior to your procedure  If you use a  CPAP at night, you may bring your mask for your overnight stay.   Contacts, glasses, hearing aids, dentures or partials may not be worn into surgery, please bring cases for these belongings   For patients admitted to the hospital, discharge time will be determined by your treatment team.   Patients discharged the day of surgery will not be allowed to drive home, and someone needs to stay with them for 24 hours.  NO VISITORS WILL BE ALLOWED IN PRE-OP WHERE PATIENTS ARE PREPPED FOR SURGERY.  ONLY 1 SUPPORT PERSON MAY BE PRESENT IN THE WAITING ROOM WHILE YOU ARE IN SURGERY.  IF YOU ARE TO BE ADMITTED, ONCE YOU ARE IN YOUR ROOM YOU WILL BE ALLOWED TWO (2) VISITORS. 1 (ONE) VISITOR MAY STAY OVERNIGHT BUT MUST ARRIVE TO THE ROOM BY 8pm.  Minor children may have two parents present. Special consideration for safety and communication needs will be reviewed on a case by case basis.  Special instructions:    Oral Hygiene is also important to reduce your risk of infection.  Remember - BRUSH YOUR TEETH THE MORNING OF SURGERY WITH YOUR REGULAR TOOTHPASTE   [facility]- Preparing For Surgery  Before surgery, you can play an important role. Because skin is not sterile, your skin needs to be as free of germs as possible. You can reduce the number of germs on your skin by washing with CHG (chlorahexidine gluconate) Soap before surgery.  CHG is an antiseptic cleaner which kills germs and bonds with the skin to continue  killing germs even after washing.     Please do not use if you have an allergy to CHG or antibacterial soaps. If your skin becomes reddened/irritated stop using the CHG.  Do not shave (including legs and underarms) for at least 48 hours prior to first CHG shower. It is OK to shave your face.  Please follow these instructions carefully.     Shower the NIGHT BEFORE SURGERY and the MORNING OF SURGERY with CHG Soap.   If you chose to wash your hair, wash your hair first as usual with your normal  shampoo. After you shampoo, rinse your hair and body thoroughly to remove the shampoo.  Then [employer] and genitals (private parts) with your normal soap and rinse thoroughly to remove soap.  After that Use CHG Soap as you would any other liquid soap. You can apply CHG directly to the skin and wash gently with a scrungie or a clean washcloth.   Apply the CHG Soap to your body ONLY FROM THE NECK DOWN.  Do not use on open wounds or open sores. Avoid contact with your eyes, ears, mouth and genitals (private parts). Wash Face and genitals (private parts)  with your normal soap.   Wash thoroughly, paying special attention to the area where your surgery will be performed.  Thoroughly rinse your body with warm water from the neck down.  DO NOT shower/wash with your normal soap after using and rinsing off the CHG Soap.  Pat yourself dry with a CLEAN TOWEL.  Wear CLEAN PAJAMAS to bed the night before surgery  Place CLEAN SHEETS on your bed the night before your surgery  DO NOT SLEEP WITH PETS.   Day of Surgery:  Take a shower with CHG soap. Wear Clean/Comfortable clothing the morning of surgery Do not apply any deodorants/lotions.   Remember to brush your teeth WITH YOUR REGULAR TOOTHPASTE.    COVID testing  If you are going to stay overnight or be admitted after your procedure/surgery and require a pre-op COVID test, please follow these instructions after your COVID test   You are not required to quarantine however you are required to wear a well-fitting mask when you are out and around people not in your household.  If your mask becomes wet or soiled, replace with a new one.  Wash your hands often with soap and water for 20 seconds or clean your hands with an alcohol-based hand sanitizer that contains at least 60% alcohol.  Do not share personal items.  Notify your provider: if you are in close contact with someone who has COVID  or if you develop a fever of 100.4 or greater,  sneezing, cough, sore throat, shortness of breath or body aches.    Please read over the following fact sheets that you were given.

## 2021-08-13 ENCOUNTER — Encounter (HOSPITAL_COMMUNITY): Payer: Self-pay

## 2021-08-13 ENCOUNTER — Encounter (HOSPITAL_COMMUNITY)
Admission: RE | Admit: 2021-08-13 | Discharge: 2021-08-13 | Disposition: A | Discharge status: Home / Self Care | Payer: Medicare Other | Source: Ambulatory Visit | Attending: Neurosurgery | Admitting: Neurosurgery

## 2021-08-13 ENCOUNTER — Other Ambulatory Visit: Payer: Self-pay

## 2021-08-13 VITALS — BP 125/85 | HR 77 | Temp 98.4°F | Resp 18 | Ht 64.0 in | Wt 123.2 lb

## 2021-08-13 DIAGNOSIS — Z01812 Encounter for preprocedural laboratory examination: Secondary | ICD-10-CM | POA: Insufficient documentation

## 2021-08-13 DIAGNOSIS — J439 Emphysema, unspecified: Secondary | ICD-10-CM | POA: Insufficient documentation

## 2021-08-13 DIAGNOSIS — I700 Atherosclerosis of aorta: Secondary | ICD-10-CM | POA: Insufficient documentation

## 2021-08-13 DIAGNOSIS — Z01818 Encounter for other preprocedural examination: Secondary | ICD-10-CM

## 2021-08-13 DIAGNOSIS — I2510 Atherosclerotic heart disease of native coronary artery without angina pectoris: Secondary | ICD-10-CM

## 2021-08-13 DIAGNOSIS — Z955 Presence of coronary angioplasty implant and graft: Secondary | ICD-10-CM | POA: Insufficient documentation

## 2021-08-13 DIAGNOSIS — Z20822 Contact with and (suspected) exposure to covid-19: Secondary | ICD-10-CM | POA: Insufficient documentation

## 2021-08-13 HISTORY — DX: Unspecified osteoarthritis, unspecified site: M19.90

## 2021-08-13 HISTORY — DX: Pneumonia, unspecified organism: J18.9

## 2021-08-13 LAB — CBC
HCT: 38.7 % (ref 36.0–46.0)
Hemoglobin: 12.8 g/dL (ref 12.0–15.0)
MCH: 29.3 pg (ref 26.0–34.0)
MCHC: 33.1 g/dL (ref 30.0–36.0)
MCV: 88.6 fL (ref 80.0–100.0)
Platelets: 343 10*3/uL (ref 150–400)
RBC: 4.37 MIL/uL (ref 3.87–5.11)
RDW: 14.2 % (ref 11.5–15.5)
WBC: 9.4 10*3/uL (ref 4.0–10.5)
nRBC: 0 % (ref 0.0–0.2)

## 2021-08-13 LAB — BASIC METABOLIC PANEL
Anion gap: 6 (ref 5–15)
BUN: 9 mg/dL (ref 8–23)
CO2: 27 mmol/L (ref 22–32)
Calcium: 9 mg/dL (ref 8.9–10.3)
Chloride: 109 mmol/L (ref 98–111)
Creatinine, Ser: 0.69 mg/dL (ref 0.44–1.00)
GFR, Estimated: >60 mL/min (ref >60)
Glucose, Bld: 98 mg/dL (ref 70–99)
Potassium: 4.2 mmol/L (ref 3.5–5.1)
Sodium: 142 mmol/L (ref 135–145)

## 2021-08-13 LAB — TYPE AND SCREEN
ABO/RH(D): O POS
Antibody Screen: NEGATIVE

## 2021-08-13 LAB — SURGICAL PCR SCREEN
MRSA, PCR: NEGATIVE
Staphylococcus aureus: NEGATIVE

## 2021-08-13 LAB — SARS CORONAVIRUS 2 (TAT 6-24 HRS): SARS Coronavirus 2: NEGATIVE

## 2021-08-13 NOTE — Progress Notes (Signed)
PCP - Lanelle Bal, PA-C Cardiologist - Dr. Carlyle Dolly- CAD- s/p stent 2001  PPM/ICD - denies   Chest x-ray - 04/16/21 EKG - 04/17/21 Stress Test - denies- unable to perform, pt became hypotensive, among other symptoms ECHO - 04/17/21 Cardiac Cath - 04/18/21  Sleep Study - denies   DM- denies  Blood Thinner Instructions: n/a Aspirin Instructions: n/a  ERAS Protcol - yes, no drink   COVID TEST- 08/13/21 at PAT   Anesthesia review: yes, cardiac hx  Patient denies shortness of breath, fever, cough and chest pain at PAT appointment   All instructions explained to the patient, with a verbal understanding of the material. Patient agrees to go over the instructions while at home for a better understanding. Patient also instructed to wear a mask in public after being tested for COVID-19. The opportunity to ask questions was provided.

## 2021-08-14 ENCOUNTER — Other Ambulatory Visit: Payer: Self-pay | Admitting: Neurosurgery

## 2021-08-14 NOTE — Anesthesia Preprocedure Evaluation (Addendum)
Anesthesia Evaluation  Patient identified by MRN, date of birth, ID band Patient awake    Reviewed: Allergy & Precautions, NPO status , Patient's Chart, lab work & pertinent test results  History of Anesthesia Complications Negative for: history of anesthetic complications  Airway Mallampati: II  TM Distance: >3 FB Neck ROM: Limited    Dental  (+) Dental Advisory Given, Edentulous Upper, Edentulous Lower   Pulmonary COPD,  COPD inhaler and oxygen dependent, Current Smoker and Patient abstained from smoking., PE (1999)   Pulmonary exam normal breath sounds clear to auscultation       Cardiovascular + angina + CAD and + Cardiac Stents  Normal cardiovascular exam Rhythm:Regular Rate:Normal  03/2021 echo: IMPRESSIONS  1. Left ventricular ejection fraction, by estimation, is 65 to 70%. The  left ventricle has normal function. The left ventricle has no regional  wall motion abnormalities. Left ventricular diastolic parameters are  indeterminate.  2. Right ventricular systolic function is normal. The right ventricular  size is normal. There is normal pulmonary artery systolic pressure. The  estimated right ventricular systolic pressure is [id number] mmHg.  3. The mitral valve is grossly normal. Mild mitral valve regurgitation.  4. The aortic valve is tricuspid. Aortic valve regurgitation is not  visualized.  5. The inferior vena cava is normal in size with <50% respiratory  variability, suggesting right atrial pressure of 8 mmHg.    Neuro/Psych  Headaches, Cervical spondylosis with myelopathy and radiculopathy  CVA (1992), No Residual Symptoms negative psych ROS   GI/Hepatic Neg liver ROS, hiatal hernia, PUD, GERD  Medicated,UC   Endo/Other  negative endocrine ROS  Renal/GU negative Renal ROS     Musculoskeletal  (+) Arthritis ,   Abdominal   Peds  Hematology negative hematology ROS (+)   Anesthesia Other  Findings Day of surgery medications reviewed with the patient.  Reproductive/Obstetrics                           Anesthesia Physical Anesthesia Plan  ASA: 3  Anesthesia Plan: General   Post-op Pain Management: Tylenol PO (pre-op)*   Induction: Intravenous  PONV Risk Score and Plan: 2 and Midazolam, Dexamethasone and Ondansetron  Airway Management Planned: Oral ETT and Video Laryngoscope Planned  Additional Equipment:   Intra-op Plan:   Post-operative Plan: Extubation in OR  Informed Consent: I have reviewed the patients History and Physical, chart, labs and discussed the procedure including the risks, benefits and alternatives for the proposed anesthesia with the patient or authorized representative who has indicated his/her understanding and acceptance.     Dental advisory given  Plan Discussed with: CRNA  Anesthesia Plan Comments: (PAT note by Karoline Caldwell, PA-C: Follows with cardiology for hx of CAD s/p stent 2001. Recent admission October 2022 for chest pain. Cath showed patent vessels and LCX stent. Echo showed LVEF 65-70%, normal wall motion, mild MR. Last seen by Dr. Harl Bowie 06/11/21, stable at that time, no recurrence of symptoms, recommended to continue current medications.  Follows with pulmonology for history of nocturnal hypoxemia on 2 L O2 nightly and asthmatic bronchitis/COPD Gold 0.  She is a current smoker.  Last seen by Dr. Melvyn Novas 07/10/2021 and felt to be having exacerbations prescribed a 6-day course of Augmentin and prednisone.  She is maintained on Symbicort twice daily.  Preop labs reviewed, WNL.  EKG 04/16/2021: Sinus rhythm.  Rate 62.  CTA chest 04/17/2021: IMPRESSION: 1. Negative for acute PE or thoracic aortic dissection.  2. Coronary and Aortic Atherosclerosis (ICD10-170.0). 3. Emphysema (ICD10-J43.9).  03/2021 cath: Prox RCA lesion is 20% stenosed.  Mid Cx lesion is 10% stenosed.  LV end diastolic pressure is  normal.  1. Widely patent mid left circumflex stent with mild disease elsewhere. 2. Normal LV function with LVEDP of 17 mmHg.  03/2021 echo: IMPRESSIONS  1. Left ventricular ejection fraction, by estimation, is 65 to 70%. The  left ventricle has normal function. The left ventricle has no regional  wall motion abnormalities. Left ventricular diastolic parameters are  indeterminate.  2. Right ventricular systolic function is normal. The right ventricular  size is normal. There is normal pulmonary artery systolic pressure. The  estimated right ventricular systolic pressure is [id number] mmHg.  3. The mitral valve is grossly normal. Mild mitral valve regurgitation.  4. The aortic valve is tricuspid. Aortic valve regurgitation is not  visualized.  5. The inferior vena cava is normal in size with <50% respiratory  variability, suggesting right atrial pressure of 8 mmHg.   )       Anesthesia Quick Evaluation

## 2021-08-14 NOTE — Progress Notes (Signed)
Anesthesia Chart Review:  Follows with cardiology for hx of CAD s/p stent 2001. Recent admission October 2022 for chest pain. Cath showed patent vessels and LCX stent. Echo showed LVEF 65-70%, normal wall motion, mild MR. Last seen by Dr. Wyline Mood 06/11/21, stable at that time, no recurrence of symptoms, recommended to continue current medications.  Follows with pulmonology for history of nocturnal hypoxemia on 2 L O2 nightly and asthmatic bronchitis/COPD Gold 0.  She is a current smoker.  Last seen by Dr. Sherene Sires 07/10/2021 and felt to be having exacerbations prescribed a 6-day course of Augmentin and prednisone.  She is maintained on Symbicort twice daily.  Preop labs reviewed, WNL.  EKG 04/16/2021: Sinus rhythm.  Rate 62.  CTA chest 04/17/2021: IMPRESSION: 1. Negative for acute PE or thoracic aortic dissection. 2. Coronary and Aortic Atherosclerosis (ICD10-170.0). 3.   Emphysema (ICD10-J43.9).   03/2021 cath:  Prox RCA lesion is 20% stenosed.   Mid Cx lesion is 10% stenosed.   LV end diastolic pressure is normal.   1.  Widely patent mid left circumflex stent with mild disease elsewhere. 2.  Normal LV function with LVEDP of 17 mmHg.   03/2021 echo: IMPRESSIONS   1. Left ventricular ejection fraction, by estimation, is 65 to 70%. The  left ventricle has normal function. The left ventricle has no regional  wall motion abnormalities. Left ventricular diastolic parameters are  indeterminate.   2. Right ventricular systolic function is normal. The right ventricular  size is normal. There is normal pulmonary artery systolic pressure. The  estimated right ventricular systolic pressure is 34.0 mmHg.   3. The mitral valve is grossly normal. Mild mitral valve regurgitation.   4. The aortic valve is tricuspid. Aortic valve regurgitation is not  visualized.   5. The inferior vena cava is normal in size with <50% respiratory  variability, suggesting right atrial pressure of 8 mmHg.    [facility]/Anesthesiology Phone [phone] 08/14/2021 10:02 AM

## 2021-08-15 ENCOUNTER — Ambulatory Visit (HOSPITAL_COMMUNITY): Payer: Medicare Other | Admitting: Anesthesiology

## 2021-08-15 ENCOUNTER — Ambulatory Visit (HOSPITAL_COMMUNITY): Payer: Medicare Other

## 2021-08-15 ENCOUNTER — Ambulatory Visit (HOSPITAL_COMMUNITY): Payer: Medicare Other | Admitting: Physician Assistant

## 2021-08-15 ENCOUNTER — Encounter (HOSPITAL_COMMUNITY): Payer: Self-pay | Admitting: Neurosurgery

## 2021-08-15 ENCOUNTER — Inpatient Hospital Stay (HOSPITAL_COMMUNITY)
Admission: AD | Admit: 2021-08-15 | Discharge: 2021-08-16 | DRG: 473 | Disposition: A | Discharge status: Home / Self Care | Payer: Medicare Other | Attending: Neurosurgery | Admitting: Neurosurgery

## 2021-08-15 ENCOUNTER — Other Ambulatory Visit: Payer: Self-pay

## 2021-08-15 ENCOUNTER — Encounter (HOSPITAL_COMMUNITY)
Admission: AD | Disposition: A | Discharge status: Home / Self Care | Payer: Self-pay | Source: Home / Self Care | Attending: Neurosurgery

## 2021-08-15 DIAGNOSIS — Z800 Family history of malignant neoplasm of digestive organs: Secondary | ICD-10-CM

## 2021-08-15 DIAGNOSIS — M1990 Unspecified osteoarthritis, unspecified site: Secondary | ICD-10-CM | POA: Diagnosis present

## 2021-08-15 DIAGNOSIS — Z79899 Other long term (current) drug therapy: Secondary | ICD-10-CM | POA: Diagnosis not present

## 2021-08-15 DIAGNOSIS — Z8249 Family history of ischemic heart disease and other diseases of the circulatory system: Secondary | ICD-10-CM | POA: Diagnosis not present

## 2021-08-15 DIAGNOSIS — M4802 Spinal stenosis, cervical region: Secondary | ICD-10-CM

## 2021-08-15 DIAGNOSIS — F17210 Nicotine dependence, cigarettes, uncomplicated: Secondary | ICD-10-CM | POA: Diagnosis present

## 2021-08-15 DIAGNOSIS — Z9981 Dependence on supplemental oxygen: Secondary | ICD-10-CM | POA: Diagnosis not present

## 2021-08-15 DIAGNOSIS — M4722 Other spondylosis with radiculopathy, cervical region: Secondary | ICD-10-CM | POA: Diagnosis present

## 2021-08-15 DIAGNOSIS — M5011 Cervical disc disorder with radiculopathy,  high cervical region: Secondary | ICD-10-CM

## 2021-08-15 DIAGNOSIS — Z86711 Personal history of pulmonary embolism: Secondary | ICD-10-CM | POA: Diagnosis not present

## 2021-08-15 DIAGNOSIS — Z955 Presence of coronary angioplasty implant and graft: Secondary | ICD-10-CM

## 2021-08-15 DIAGNOSIS — Z8673 Personal history of transient ischemic attack (TIA), and cerebral infarction without residual deficits: Secondary | ICD-10-CM

## 2021-08-15 DIAGNOSIS — J449 Chronic obstructive pulmonary disease, unspecified: Secondary | ICD-10-CM | POA: Diagnosis present

## 2021-08-15 DIAGNOSIS — Z882 Allergy status to sulfonamides status: Secondary | ICD-10-CM | POA: Diagnosis not present

## 2021-08-15 DIAGNOSIS — I2510 Atherosclerotic heart disease of native coronary artery without angina pectoris: Secondary | ICD-10-CM | POA: Diagnosis present

## 2021-08-15 DIAGNOSIS — Z7951 Long term (current) use of inhaled steroids: Secondary | ICD-10-CM | POA: Diagnosis not present

## 2021-08-15 DIAGNOSIS — Z20822 Contact with and (suspected) exposure to covid-19: Secondary | ICD-10-CM | POA: Diagnosis present

## 2021-08-15 DIAGNOSIS — M542 Cervicalgia: Secondary | ICD-10-CM | POA: Diagnosis present

## 2021-08-15 DIAGNOSIS — M4712 Other spondylosis with myelopathy, cervical region: Secondary | ICD-10-CM | POA: Diagnosis present

## 2021-08-15 DIAGNOSIS — Z419 Encounter for procedure for purposes other than remedying health state, unspecified: Secondary | ICD-10-CM

## 2021-08-15 DIAGNOSIS — Z885 Allergy status to narcotic agent status: Secondary | ICD-10-CM

## 2021-08-15 DIAGNOSIS — Z833 Family history of diabetes mellitus: Secondary | ICD-10-CM

## 2021-08-15 HISTORY — PX: ANTERIOR CERVICAL DECOMP/DISCECTOMY FUSION: SHX1161

## 2021-08-15 LAB — ABO/RH: ABO/RH(D): O POS

## 2021-08-15 SURGERY — ANTERIOR CERVICAL DECOMPRESSION/DISCECTOMY FUSION 3 LEVELS
Anesthesia: General

## 2021-08-15 MED ORDER — ACETAMINOPHEN 500 MG PO TABS
1000.0000 mg | ORAL_TABLET | Freq: Once | ORAL | Status: AC
Start: 2021-08-15 — End: 2021-08-15
  Administered 2021-08-15: 1000 mg via ORAL
  Filled 2021-08-15: qty 2

## 2021-08-15 MED ORDER — FENTANYL CITRATE (PF) 100 MCG/2ML IJ SOLN
INTRAMUSCULAR | Status: AC
  Administered 2021-08-15: 25 ug via INTRAVENOUS
  Filled 2021-08-15: qty 2

## 2021-08-15 MED ORDER — THROMBIN 5000 UNITS EX SOLR
OROMUCOSAL | Status: DC | PRN
  Administered 2021-08-15: 5 mL via TOPICAL

## 2021-08-15 MED ORDER — LACTATED RINGERS IV SOLN: INTRAVENOUS | Status: DC

## 2021-08-15 MED ORDER — PHENOL 1.4 % MT LIQD: 1.0000 | OROMUCOSAL | Status: DC | PRN

## 2021-08-15 MED ORDER — PHENYLEPHRINE HCL (PRESSORS) 10 MG/ML IV SOLN
INTRAVENOUS | Status: DC | PRN
Start: 2021-08-15 — End: 2021-08-15
  Administered 2021-08-15 (×3): 80 ug via INTRAVENOUS

## 2021-08-15 MED ORDER — ONDANSETRON HCL 4 MG/2ML IJ SOLN
INTRAMUSCULAR | Status: DC | PRN
  Administered 2021-08-15: 4 mg via INTRAVENOUS

## 2021-08-15 MED ORDER — ONDANSETRON HCL 4 MG/2ML IJ SOLN: 4.0000 mg | Freq: Four times a day (QID) | INTRAMUSCULAR | Status: DC | PRN

## 2021-08-15 MED ORDER — DEXAMETHASONE 4 MG PO TABS
4.0000 mg | ORAL_TABLET | Freq: Four times a day (QID) | ORAL | Status: AC
  Administered 2021-08-15: 4 mg via ORAL
  Filled 2021-08-15: qty 1

## 2021-08-15 MED ORDER — PROMETHAZINE HCL 25 MG/ML IJ SOLN: 6.2500 mg | INTRAMUSCULAR | Status: DC | PRN

## 2021-08-15 MED ORDER — ONDANSETRON HCL 4 MG PO TABS: 4.0000 mg | ORAL_TABLET | Freq: Four times a day (QID) | ORAL | Status: DC | PRN

## 2021-08-15 MED ORDER — ACETAMINOPHEN 325 MG PO TABS: 650.0000 mg | ORAL_TABLET | ORAL | Status: DC | PRN

## 2021-08-15 MED ORDER — CYCLOBENZAPRINE HCL 10 MG PO TABS
10.0000 mg | ORAL_TABLET | Freq: Three times a day (TID) | ORAL | Status: DC | PRN
  Administered 2021-08-16: 10 mg via ORAL
  Filled 2021-08-15: qty 1

## 2021-08-15 MED ORDER — MOMETASONE FURO-FORMOTEROL FUM 100-5 MCG/ACT IN AERO
2.0000 | INHALATION_SPRAY | Freq: Two times a day (BID) | RESPIRATORY_TRACT | Status: DC
  Administered 2021-08-16: 2 via RESPIRATORY_TRACT
  Filled 2021-08-15: qty 8.8

## 2021-08-15 MED ORDER — CEFAZOLIN SODIUM-DEXTROSE 2-4 GM/100ML-% IV SOLN
2.0000 g | Freq: Three times a day (TID) | INTRAVENOUS | Status: AC
  Administered 2021-08-15 – 2021-08-16 (×2): 2 g via INTRAVENOUS
  Filled 2021-08-15 (×2): qty 100

## 2021-08-15 MED ORDER — ALBUTEROL SULFATE HFA 108 (90 BASE) MCG/ACT IN AERS
INHALATION_SPRAY | RESPIRATORY_TRACT | Status: DC | PRN
  Administered 2021-08-15: 3 via RESPIRATORY_TRACT
  Administered 2021-08-15: 2 via RESPIRATORY_TRACT

## 2021-08-15 MED ORDER — FENTANYL CITRATE (PF) 250 MCG/5ML IJ SOLN
INTRAMUSCULAR | Status: DC | PRN
  Administered 2021-08-15: 75 ug via INTRAVENOUS
  Administered 2021-08-15: 50 ug via INTRAVENOUS
  Administered 2021-08-15: 25 ug via INTRAVENOUS

## 2021-08-15 MED ORDER — LACTATED RINGERS IV SOLN: INTRAVENOUS | Status: DC | PRN

## 2021-08-15 MED ORDER — PANTOPRAZOLE SODIUM 40 MG IV SOLR
40.0000 mg | Freq: Every day | INTRAVENOUS | Status: DC
  Administered 2021-08-15: 40 mg via INTRAVENOUS
  Filled 2021-08-15: qty 10

## 2021-08-15 MED ORDER — BUPIVACAINE-EPINEPHRINE 0.5% -1:200000 IJ SOLN
INTRAMUSCULAR | Status: AC
  Filled 2021-08-15: qty 1

## 2021-08-15 MED ORDER — CEFAZOLIN SODIUM-DEXTROSE 2-4 GM/100ML-% IV SOLN
INTRAVENOUS | Status: AC
  Filled 2021-08-15: qty 100

## 2021-08-15 MED ORDER — ACETAMINOPHEN 500 MG PO TABS
1000.0000 mg | ORAL_TABLET | Freq: Four times a day (QID) | ORAL | Status: DC
  Administered 2021-08-15 – 2021-08-16 (×3): 1000 mg via ORAL
  Filled 2021-08-15 (×3): qty 2

## 2021-08-15 MED ORDER — ALBUTEROL SULFATE (2.5 MG/3ML) 0.083% IN NEBU: 2.5000 mg | INHALATION_SOLUTION | RESPIRATORY_TRACT | Status: DC | PRN

## 2021-08-15 MED ORDER — ALBUTEROL SULFATE HFA 108 (90 BASE) MCG/ACT IN AERS
INHALATION_SPRAY | RESPIRATORY_TRACT | Status: AC
  Filled 2021-08-15: qty 6.7

## 2021-08-15 MED ORDER — FENTANYL CITRATE (PF) 100 MCG/2ML IJ SOLN
25.0000 ug | INTRAMUSCULAR | Status: DC | PRN
  Administered 2021-08-15: 50 ug via INTRAVENOUS
  Administered 2021-08-15: 25 ug via INTRAVENOUS

## 2021-08-15 MED ORDER — OXYCODONE HCL 5 MG PO TABS
10.0000 mg | ORAL_TABLET | ORAL | Status: DC | PRN
  Administered 2021-08-16: 10 mg via ORAL
  Filled 2021-08-15: qty 2

## 2021-08-15 MED ORDER — GUAIFENESIN ER 600 MG PO TB12
600.0000 mg | ORAL_TABLET | Freq: Two times a day (BID) | ORAL | Status: DC
  Administered 2021-08-15: 600 mg via ORAL
  Filled 2021-08-15 (×3): qty 1

## 2021-08-15 MED ORDER — ONDANSETRON 4 MG PO TBDP: 8.0000 mg | ORAL_TABLET | Freq: Four times a day (QID) | ORAL | Status: DC | PRN

## 2021-08-15 MED ORDER — FENTANYL CITRATE (PF) 100 MCG/2ML IJ SOLN: 25.0000 ug | INTRAMUSCULAR | Status: DC | PRN

## 2021-08-15 MED ORDER — ORAL CARE MOUTH RINSE: 15.0000 mL | Freq: Once | OROMUCOSAL | Status: AC

## 2021-08-15 MED ORDER — CEFAZOLIN SODIUM-DEXTROSE 2-3 GM-%(50ML) IV SOLR
INTRAVENOUS | Status: DC | PRN
  Administered 2021-08-15: 2 g via INTRAVENOUS

## 2021-08-15 MED ORDER — DEXAMETHASONE SODIUM PHOSPHATE 10 MG/ML IJ SOLN
INTRAMUSCULAR | Status: DC | PRN
Start: 2021-08-15 — End: 2021-08-15
  Administered 2021-08-15: 5 mg via INTRAVENOUS

## 2021-08-15 MED ORDER — ACETAMINOPHEN 650 MG RE SUPP: 650.0000 mg | RECTAL | Status: DC | PRN

## 2021-08-15 MED ORDER — MIDAZOLAM HCL 5 MG/5ML IJ SOLN
INTRAMUSCULAR | Status: DC | PRN
  Administered 2021-08-15: 1 mg via INTRAVENOUS

## 2021-08-15 MED ORDER — HYDROXYZINE HCL 25 MG PO TABS: 50.0000 mg | ORAL_TABLET | Freq: Three times a day (TID) | ORAL | Status: DC | PRN

## 2021-08-15 MED ORDER — MIDAZOLAM HCL 2 MG/2ML IJ SOLN
INTRAMUSCULAR | Status: AC
  Filled 2021-08-15: qty 2

## 2021-08-15 MED ORDER — MENTHOL 3 MG MT LOZG: 1.0000 | LOZENGE | OROMUCOSAL | Status: DC | PRN

## 2021-08-15 MED ORDER — BUPIVACAINE-EPINEPHRINE (PF) 0.5% -1:200000 IJ SOLN
INTRAMUSCULAR | Status: DC | PRN
Start: 2021-08-15 — End: 2021-08-15
  Administered 2021-08-15: 10 mL via PERINEURAL

## 2021-08-15 MED ORDER — THROMBIN 5000 UNITS EX SOLR
CUTANEOUS | Status: AC
  Filled 2021-08-15: qty 5000

## 2021-08-15 MED ORDER — MORPHINE SULFATE (PF) 4 MG/ML IV SOLN: 4.0000 mg | INTRAVENOUS | Status: DC | PRN

## 2021-08-15 MED ORDER — 0.9 % SODIUM CHLORIDE (POUR BTL) OPTIME
TOPICAL | Status: DC | PRN
  Administered 2021-08-15: 1000 mL

## 2021-08-15 MED ORDER — DOCUSATE SODIUM 100 MG PO CAPS
100.0000 mg | ORAL_CAPSULE | Freq: Two times a day (BID) | ORAL | Status: DC
  Administered 2021-08-15 – 2021-08-16 (×2): 100 mg via ORAL
  Filled 2021-08-15 (×2): qty 1

## 2021-08-15 MED ORDER — FENTANYL CITRATE (PF) 250 MCG/5ML IJ SOLN
INTRAMUSCULAR | Status: AC
  Filled 2021-08-15: qty 5

## 2021-08-15 MED ORDER — DEXAMETHASONE SODIUM PHOSPHATE 4 MG/ML IJ SOLN
4.0000 mg | Freq: Four times a day (QID) | INTRAMUSCULAR | Status: AC
  Administered 2021-08-15: 4 mg via INTRAVENOUS
  Filled 2021-08-15: qty 1

## 2021-08-15 MED ORDER — BISACODYL 10 MG RE SUPP: 10.0000 mg | Freq: Every day | RECTAL | Status: DC | PRN

## 2021-08-15 MED ORDER — PHENYLEPHRINE HCL-NACL 20-0.9 MG/250ML-% IV SOLN
INTRAVENOUS | Status: DC | PRN
  Administered 2021-08-15: 15 ug/min via INTRAVENOUS

## 2021-08-15 MED ORDER — OXYCODONE HCL 5 MG PO TABS
5.0000 mg | ORAL_TABLET | ORAL | Status: DC | PRN
  Administered 2021-08-15 – 2021-08-16 (×2): 5 mg via ORAL
  Filled 2021-08-15 (×2): qty 1

## 2021-08-15 MED ORDER — FAMOTIDINE 20 MG PO TABS
20.0000 mg | ORAL_TABLET | Freq: Two times a day (BID) | ORAL | Status: DC
Start: 2021-08-15 — End: 2021-08-16
  Administered 2021-08-15 – 2021-08-16 (×2): 20 mg via ORAL
  Filled 2021-08-15 (×2): qty 1

## 2021-08-15 MED ORDER — CHLORHEXIDINE GLUCONATE 0.12 % MT SOLN
15.0000 mL | Freq: Once | OROMUCOSAL | Status: AC
  Administered 2021-08-15: 15 mL via OROMUCOSAL
  Filled 2021-08-15: qty 15

## 2021-08-15 MED ORDER — GABAPENTIN 300 MG PO CAPS
300.0000 mg | ORAL_CAPSULE | Freq: Three times a day (TID) | ORAL | Status: DC
  Administered 2021-08-15 – 2021-08-16 (×3): 300 mg via ORAL
  Filled 2021-08-15 (×3): qty 1

## 2021-08-15 MED ORDER — ALBUTEROL SULFATE HFA 108 (90 BASE) MCG/ACT IN AERS: 2.0000 | INHALATION_SPRAY | Freq: Four times a day (QID) | RESPIRATORY_TRACT | Status: DC | PRN

## 2021-08-15 MED ORDER — PROPOFOL 10 MG/ML IV BOLUS
INTRAVENOUS | Status: AC
  Filled 2021-08-15: qty 20

## 2021-08-15 MED ORDER — THROMBIN 20000 UNITS EX SOLR
CUTANEOUS | Status: AC
  Filled 2021-08-15: qty 20000

## 2021-08-15 MED ORDER — BACITRACIN ZINC 500 UNIT/GM EX OINT
TOPICAL_OINTMENT | CUTANEOUS | Status: DC | PRN
  Administered 2021-08-15: 1 via TOPICAL

## 2021-08-15 MED ORDER — LIDOCAINE 2% (20 MG/ML) 5 ML SYRINGE
INTRAMUSCULAR | Status: DC | PRN
Start: 2021-08-15 — End: 2021-08-15
  Administered 2021-08-15: 60 mg via INTRAVENOUS

## 2021-08-15 MED ORDER — QUETIAPINE FUMARATE 100 MG PO TABS
100.0000 mg | ORAL_TABLET | Freq: Every day | ORAL | Status: DC
  Administered 2021-08-15: 100 mg via ORAL
  Filled 2021-08-15 (×2): qty 1

## 2021-08-15 MED ORDER — SUGAMMADEX SODIUM 200 MG/2ML IV SOLN
INTRAVENOUS | Status: DC | PRN
  Administered 2021-08-15: 120 mg via INTRAVENOUS

## 2021-08-15 MED ORDER — ZOLPIDEM TARTRATE 5 MG PO TABS: 5.0000 mg | ORAL_TABLET | Freq: Every evening | ORAL | Status: DC | PRN

## 2021-08-15 MED ORDER — PROPOFOL 10 MG/ML IV BOLUS
INTRAVENOUS | Status: DC | PRN
  Administered 2021-08-15: 130 mg via INTRAVENOUS

## 2021-08-15 MED ORDER — ONDANSETRON HCL 4 MG/2ML IJ SOLN: 4.0000 mg | Freq: Once | INTRAMUSCULAR | Status: DC | PRN

## 2021-08-15 MED ORDER — ROCURONIUM BROMIDE 10 MG/ML (PF) SYRINGE
PREFILLED_SYRINGE | INTRAVENOUS | Status: DC | PRN
Start: 2021-08-15 — End: 2021-08-15
  Administered 2021-08-15 (×3): 20 mg via INTRAVENOUS
  Administered 2021-08-15: 60 mg via INTRAVENOUS

## 2021-08-15 MED ORDER — ALUM & MAG HYDROXIDE-SIMETH 200-200-20 MG/5ML PO SUSP: 30.0000 mL | Freq: Four times a day (QID) | ORAL | Status: DC | PRN

## 2021-08-15 MED ORDER — ATORVASTATIN CALCIUM 40 MG PO TABS
40.0000 mg | ORAL_TABLET | Freq: Every day | ORAL | Status: DC
  Administered 2021-08-15 – 2021-08-16 (×2): 40 mg via ORAL
  Filled 2021-08-15 (×2): qty 1

## 2021-08-15 MED ORDER — BACITRACIN ZINC 500 UNIT/GM EX OINT
TOPICAL_OINTMENT | CUTANEOUS | Status: AC
  Filled 2021-08-15: qty 28.35

## 2021-08-15 SURGICAL SUPPLY — 62 items
APL SKNCLS STERI-STRIP NONHPOA (GAUZE/BANDAGES/DRESSINGS) ×1
BAG COUNTER SPONGE SURGICOUNT (BAG) ×4 IMPLANT
BAG SPNG CNTER NS LX DISP (BAG) ×2
BAND INSRT 18 STRL LF DISP RB (MISCELLANEOUS)
BAND RUBBER #18 3X1/16 STRL (MISCELLANEOUS) IMPLANT
BENZOIN TINCTURE PRP APPL 2/3 (GAUZE/BANDAGES/DRESSINGS) ×5 IMPLANT
BIT DRILL NEURO 2X3.1 SFT TUCH (MISCELLANEOUS) ×2 IMPLANT
BLADE SURG 15 STRL LF DISP TIS (BLADE) ×2 IMPLANT
BLADE SURG 15 STRL SS (BLADE) ×2
BLADE ULTRA TIP 2M (BLADE) ×3 IMPLANT
BUR BARREL STRAIGHT FLUTE 4.0 (BURR) ×3 IMPLANT
BUR MATCHSTICK NEURO 3.0 LAGG (BURR) ×3 IMPLANT
CABLE BIPOLOR RESECTION CORD (MISCELLANEOUS) ×1 IMPLANT
CANISTER SUCT 3000ML PPV (MISCELLANEOUS) ×3 IMPLANT
CARTRIDGE OIL MAESTRO DRILL (MISCELLANEOUS) ×2 IMPLANT
COVER MAYO STAND STRL (DRAPES) ×3 IMPLANT
DIFFUSER DRILL AIR PNEUMATIC (MISCELLANEOUS) ×3 IMPLANT
DRAIN JACKSON PRATT 10MM FLAT (MISCELLANEOUS) ×1 IMPLANT
DRAIN JACKSON PRT FLT 10 (DRAIN) ×1 IMPLANT
DRAPE LAPAROTOMY 100X72 PEDS (DRAPES) ×3 IMPLANT
DRAPE MICROSCOPE LEICA (MISCELLANEOUS) IMPLANT
DRAPE SURG 17X23 STRL (DRAPES) ×6 IMPLANT
DRILL NEURO 2X3.1 SOFT TOUCH (MISCELLANEOUS) ×2
DRSG OPSITE POSTOP 3X4 (GAUZE/BANDAGES/DRESSINGS) ×3 IMPLANT
DRSG OPSITE POSTOP 4X6 (GAUZE/BANDAGES/DRESSINGS) ×1 IMPLANT
ELECT REM PT RETURN 9FT ADLT (ELECTROSURGICAL) ×2
ELECTRODE REM PT RTRN 9FT ADLT (ELECTROSURGICAL) ×2 IMPLANT
EVACUATOR SILICONE 100CC (DRAIN) ×1 IMPLANT
GAUZE 4X4 16PLY ~~LOC~~+RFID DBL (SPONGE) ×1 IMPLANT
GLOVE EXAM NITRILE XL STR (GLOVE) IMPLANT
GLOVE SURG ENC MOIS LTX SZ8 (GLOVE) ×3 IMPLANT
GLOVE SURG ENC MOIS LTX SZ8.5 (GLOVE) ×3 IMPLANT
GOWN STRL REUS W/ TWL LRG LVL3 (GOWN DISPOSABLE) IMPLANT
GOWN STRL REUS W/ TWL XL LVL3 (GOWN DISPOSABLE) ×2 IMPLANT
GOWN STRL REUS W/TWL LRG LVL3 (GOWN DISPOSABLE)
GOWN STRL REUS W/TWL XL LVL3 (GOWN DISPOSABLE) ×2
HEMOSTAT POWDER KIT SURGIFOAM (HEMOSTASIS) ×3 IMPLANT
KIT BASIN OR (CUSTOM PROCEDURE TRAY) ×3 IMPLANT
KIT TURNOVER KIT B (KITS) ×3 IMPLANT
MARKER SKIN DUAL TIP RULER LAB (MISCELLANEOUS) ×3 IMPLANT
NDL SPNL 18GX3.5 QUINCKE PK (NEEDLE) ×2 IMPLANT
NEEDLE HYPO 22GX1.5 SAFETY (NEEDLE) ×3 IMPLANT
NEEDLE SPNL 18GX3.5 QUINCKE PK (NEEDLE) ×2 IMPLANT
NS IRRIG 1000ML POUR BTL (IV SOLUTION) ×3 IMPLANT
OIL CARTRIDGE MAESTRO DRILL (MISCELLANEOUS) ×2
PACK LAMINECTOMY NEURO (CUSTOM PROCEDURE TRAY) ×3 IMPLANT
PATTIES SURGICAL 1X1 (DISPOSABLE) ×1 IMPLANT
PEEK S VISTA 7X11X14 (Peek) ×3 IMPLANT
PIN DISTRACTION 14MM (PIN) ×6 IMPLANT
PLATE ANT CERV XTEND 3 LV 45 (Plate) ×1 IMPLANT
PUTTY DBM 5CC CALC GRAN (Putty) ×1 IMPLANT
SCREW XTD VAR 4.2 SELF TAP 12 (Screw) ×8 IMPLANT
SPONGE INTESTINAL PEANUT (DISPOSABLE) ×6 IMPLANT
SPONGE SURGIFOAM ABS GEL 100 (HEMOSTASIS) IMPLANT
SPONGE T-LAP 4X18 ~~LOC~~+RFID (SPONGE) ×1 IMPLANT
STRIP CLOSURE SKIN 1/2X4 (GAUZE/BANDAGES/DRESSINGS) ×3 IMPLANT
SUT VIC AB 0 CT1 27 (SUTURE) ×2
SUT VIC AB 0 CT1 27XBRD ANTBC (SUTURE) ×2 IMPLANT
SUT VIC AB 3-0 SH 8-18 (SUTURE) ×3 IMPLANT
TOWEL GREEN STERILE (TOWEL DISPOSABLE) ×3 IMPLANT
TOWEL GREEN STERILE FF (TOWEL DISPOSABLE) ×3 IMPLANT
WATER STERILE IRR 1000ML POUR (IV SOLUTION) ×3 IMPLANT

## 2021-08-15 NOTE — Anesthesia Postprocedure Evaluation (Signed)
Anesthesia Post Note  Patient: TAWNIA SCHIRM  Procedure(s) Performed: Cervical Three-Four, Cervical Four-Five, Cervical Five-Six  Anterior cervical decompression/discectomy/fusion/interbody prosthesis/plate/screws     Patient location during evaluation: PACU Anesthesia Type: General Level of consciousness: awake and alert Pain management: pain level controlled Vital Signs Assessment: post-procedure vital signs reviewed and stable Respiratory status: spontaneous breathing, nonlabored ventilation, respiratory function stable and patient connected to nasal cannula oxygen Cardiovascular status: blood pressure returned to baseline and stable Postop Assessment: no apparent nausea or vomiting Anesthetic complications: no   No notable events documented.  Last Vitals:  Vitals:   08/15/21 1605 08/15/21 1646  BP: 132/88 (!) 125/92  Pulse: 84 90  Resp: 11 18  Temp:  36.9 C  SpO2: 94% 98%    Last Pain:  Vitals:   08/15/21 1646  TempSrc: Oral  PainSc:                  Collene Schlichter

## 2021-08-15 NOTE — Anesthesia Procedure Notes (Signed)
Procedure Name: Intubation Date/Time: 08/15/2021 12:11 PM Performed by: Glynda Jaeger, CRNA Pre-anesthesia Checklist: Patient identified, Patient being monitored, Timeout performed, Emergency Drugs available and Suction available Patient Re-evaluated:Patient Re-evaluated prior to induction Oxygen Delivery Method: Circle System Utilized Preoxygenation: Pre-oxygenation with 100% oxygen Induction Type: IV induction Ventilation: Mask ventilation without difficulty Laryngoscope Size: 3 and Glidescope Grade View: Grade I Tube type: Oral Tube size: 7.5 mm Number of attempts: 1 Airway Equipment and Method: Stylet Placement Confirmation: ETT inserted through vocal cords under direct vision, positive ETCO2 and breath sounds checked- equal and bilateral Secured at: 22 cm Tube secured with: Tape Dental Injury: Teeth and Oropharynx as per pre-operative assessment  Comments: Elective glide

## 2021-08-15 NOTE — Transfer of Care (Signed)
Immediate Anesthesia Transfer of Care Note  Patient: GWENETTE CHURCHFIELD  Procedure(s) Performed: Cervical Three-Four, Cervical Four-Five, Cervical Five-Six  Anterior cervical decompression/discectomy/fusion/interbody prosthesis/plate/screws  Patient Location: PACU  Anesthesia Type:General  Level of Consciousness: awake and oriented  Airway & Oxygen Therapy: Patient Spontanous Breathing  Post-op Assessment: Report given to RN and Patient moving all extremities X 4  Post vital signs: Reviewed and stable  Last Vitals:  Vitals Value Taken Time  BP 124/84 08/15/21 1519  Temp    Pulse 84 08/15/21 1520  Resp 17 08/15/21 1520  SpO2 95 % 08/15/21 1520  Vitals shown include unvalidated device data.  Last Pain:  Vitals:   08/15/21 0926  TempSrc:   PainSc: 5       Patients Stated Pain Goal: 3 ([id number])  Complications: No notable events documented.

## 2021-08-15 NOTE — Progress Notes (Signed)
° °  Providing Compassionate, Quality Care - Together   Subjective: Patient reports post operative pain at her neck. She denies numbness, tingling, or weakness in her extremities.  Objective: Vital signs in last 24 hours: Temp:  [98.9 F (37.2 C)] 98.9 F (37.2 C) (02/15 1520) Pulse Rate:  [80-84] 84 (02/15 1605) Resp:  [8-17] 11 (02/15 1605) BP: (117-132)/(82-88) 132/88 (02/15 1605) SpO2:  [93 %-100 %] 94 % (02/15 1605) Weight:  [55.8 kg] 55.8 kg (02/15 0916)  Intake/Output from previous day: No intake/output data recorded. Intake/Output this shift: Total I/O In: 50 [IV Piggyback:50] Out: 200 [Blood:200]  Alert and oriented x 4 PERRLA MAE, Strength and sensation intact Incision is covered with Honeycomb dressing and Steri Strips; Dressing is clean, dry, and intact   Lab Results: Recent Labs    08/13/21 1135  WBC 9.4  HGB 12.8  HCT 38.7  PLT 343   BMET Recent Labs    08/13/21 1135  NA 142  K 4.2  CL 109  CO2 27  GLUCOSE 98  BUN 9  CREATININE 0.69  CALCIUM 9.0    Studies/Results: DG Cervical Spine 2 or 3 views  Result Date: 08/15/2021 CLINICAL DATA:  Intraop imaging for C3-C6 ACDF EXAM: CERVICAL SPINE - 2-3 VIEW COMPARISON:  MRI cervical spine 07/25/2021 FINDINGS: Intraoperative images during C3-C6 ACDF. On the final image, hardware appears intact without evidence of loosening or immediate complication. Multiple coiled metallic wire-like opacities overlie the prevertebral soft tissues. IMPRESSION: Intraoperative images during C3-C6 ACDF. No evidence of immediate hardware complication. Electronically Signed   By: Caprice Renshaw M.D.   On: 08/15/2021 15:17    Assessment/Plan: Patient underwent C3-4, C4-5, C5-6 ACDF by Dr. Lovell Sheehan on 08/15/2021. She is presently recovering in the PACU and will be transferred to [facility] shortly.   LOS: 0 days   -Admit overnight for observation. -Likely discharge home tomorrow.   Val Eagle, DNP, AGNP-C Nurse  Practitioner  [facility] [address], [place], [place] [address] P: [phone]     F: [phone]  08/15/2021, 4:12 PM

## 2021-08-15 NOTE — Op Note (Signed)
Brief history: The patient is a 61 year old white female who is complained of neck and right arm pain consistent with a cervical radiculopathy.  She has failed medical management and was worked up with a cervical MRI.  This demonstrated the patient degeneration, spondylosis and foraminal stenosis most prominent at C3-4, C4-5 and C5-6.  I discussed the various treatment options with her.  She has decided proceed with surgery.  Preoperative diagnosis: Cervical spondylosis, cervical foraminal stenosis, cervicalgia, cervical radiculopathy  Postoperative diagnosis: The same  Procedure: C3-4, C4-5 and C5-6 anterior cervical discectomy/decompression; C3-4, C4-5 and C5-6 interbody arthrodesis with local morcellized autograft bone and Zimmer DBM; insertion of interbody prosthesis at C3-4, C4-5 and C5-6 (Zimmer peek interbody prosthesis); anterior cervical plating from C3-C6 with globus titanium plate  Surgeon: Dr. Delma Officer  Asst.: Steva Ready, NP  Anesthesia: Gen. endotracheal  Estimated blood loss: 125 cc  Drains: Jackson-Pratt drain  Complications: None  Description of procedure: The patient was brought to the operating room by the anesthesia team. General endotracheal anesthesia was induced. A roll was placed under the patient's shoulders to keep the neck in the neutral position. The patient's anterior cervical region was then prepared with Betadine scrub and Betadine solution. Sterile drapes were applied.  The area to be incised was then injected with Marcaine with epinephrine solution. I then used a scalpel to make a transverse incision in the patient's left anterior neck. I used the Metzenbaum scissors to divide the platysmal muscle and then to dissect medial to the sternocleidomastoid muscle, jugular vein, and carotid artery. I carefully dissected down towards the anterior cervical spine identifying the esophagus and retracting it medially. Then using Kitner swabs to clear soft tissue from  the anterior cervical spine. We then inserted a bent spinal needle into the upper exposed intervertebral disc space. We then obtained intraoperative radiographs confirm our location.  I then used electrocautery to detach the medial border of the longus colli muscle bilaterally from the C3-4, C4-5 and C5-6 intervertebral disc spaces. I then inserted the Caspar self-retaining retractor underneath the longus colli muscle bilaterally to provide exposure.  We then incised the intervertebral disc at C3-4. We then performed a partial intervertebral discectomy with a pituitary forceps and the Karlin curettes. I then inserted distraction screws into the vertebral bodies at C3-4. We then distracted the interspace. We then used the high-speed drill to decorticate the vertebral endplates at C3-4, to drill away the remainder of the intervertebral disc, to drill away some posterior spondylosis, and to thin out the posterior longitudinal ligament. I then incised ligament with the arachnoid knife. We then removed the ligament with a Kerrison punches undercutting the vertebral endplates and decompressing the thecal sac. We then performed foraminotomies about the bilateral C4 nerve roots. This completed the decompression at this level.  I then repeated this procedure in analogous fashion at C4-5 and C5-6 decompressing the thecal sac and the bilateral C5 and C6 nerve roots.  We now turned our to attention to the interbody fusion. We used the trial spacers to determine the appropriate size for the interbody prosthesis. We then pre-filled prosthesis with a combination of local morcellized autograft bone that we obtained during decompression as well as Zimmer DBM. We then inserted the prosthesis into the distracted interspace at C3-4, C4-5 and C5-6. We then removed the distraction screws. There was a good snug fit of the prosthesis in the interspace.  Having completed the fusion we now turned attention to the anterior spinal  instrumentation. We used  the high-speed drill to drill away some anterior spondylosis at the disc spaces so that the plate lay down flat. We selected the appropriate length titanium anterior cervical plate. We laid it along the anterior aspect of the vertebral bodies from C3-C6. We then drilled 12 mm holes at C3, C4, C5 and C6. We then secured the plate to the vertebral bodies by placing two 12 mm self-tapping screws at C3, C4, C5 and C6. We then obtained intraoperative radiograph. The demonstrating good position of the instrumentation. We therefore secured the screws the plate the locking each cam. This completed the instrumentation.  We then obtained hemostasis using bipolar electrocautery. We irrigated the wound out with bacitracin solution. We then removed the retractor. We inspected the esophagus for any damage. There was none apparent.  We placed a Jackson-Pratt drain in the prevertebral space and tunneled out through a separate stab wound.  We then reapproximated patient's platysmal muscle with interrupted 3-0 Vicryl suture. We then reapproximated the subcutaneous tissue with interrupted 3-0 Vicryl suture. The skin was reapproximated with Steri-Strips and benzoin. The wound was then covered with bacitracin ointment. A sterile dressing was applied. The drapes were removed. Patient was subsequently extubated by the anesthesia team and transported to the post anesthesia care unit in stable condition. All sponge instrument and needle counts were reportedly correct at the end of this case.

## 2021-08-15 NOTE — Progress Notes (Signed)
Orthopedic Tech Progress Note Patient Details:  [name] [name] [birth year] [id number]  Patient ID: [name] [name], female   DOB: 04/18/1961, 61 y.o.   MRN: [id number] Dropped collar off in PACU.  Vernona Rieger 08/15/2021, 5:11 PM

## 2021-08-15 NOTE — H&P (Signed)
Subjective: The patient is a 61 year old white female who has complained of neck and right greater left shoulder and arm pain consistent with a cervical radiculopathy.  She has failed medical management was worked up with a cervical MRI.  This demonstrated the patient had spondylosis and foraminal stenosis most prominent at C3-4, C4-5 and C5-6.  I discussed the various treatment options with her.  She decided proceed with surgery.  Past Medical History:  Diagnosis Date   Acid reflux    Arthritis    CAD (coronary artery disease)    S/P cath with stent in 2001   CHF (congestive heart failure) (HCC)    Collagen vascular disease (HCC)    COPD (chronic obstructive pulmonary disease) ([place])    CVA (cerebral vascular accident) ([place]) 1992   DU (duodenal ulcer) 09/19/2009   Hiatal hernia    EGD september 2007. normal except for small hiatal hernia. She underwent small bowel biopsy with history of diarrhea at that time. This was negative   History of colonoscopy    August 2007. She had a pedunculated polyp at 30 cm. which was hamartomatous polyp. She  is due for 5-year followup with family history of coln cancer in a brother at age 74   History of colonoscopy    2005 revealed a linear ulcer with scar versus inflammatory process at the mid right colon, but normal terminal ileum. A biopsy of this area revealed an ulceration, but nonspecific   History of coronary angiogram    CT angiogram in 2007. negative with normal mesenteric arteries   History of kidney stones    Migraine    Pneumonia    pt states she gets pneumonia about twice a year   Pulmonary embolus ([place]) 1999   Ulcerative colitis ([place])    Urinary frequency     Past Surgical History:  Procedure Laterality Date   [place]   CHOLECYSTECTOMY  2021   COLONOSCOPY  01/2006   Rourk-pedunculated polyp 30 cm hamartomatous polyp,   COLONOSCOPY  2005   Lineal ulcer or mid right  colon, normal TI nonspecific biopsy   CORONARY ANGIOPLASTY WITH STENT PLACEMENT  2001   [facility], [place]   ESOPHAGOGASTRODUODENOSCOPY  09/19/2009   Rourk -2 duodenal bulbar ulcers, small HH otherwise normal/tiny distal esophageal erosions with mild erosive reflux   ESOPHAGOGASTRODUODENOSCOPY  03/2006   Rourk-Hiatal hernia, negative small bowel biopsy   LEFT HEART CATH AND CORONARY ANGIOGRAPHY N/A 04/18/2021   Procedure: LEFT HEART CATH AND CORONARY ANGIOGRAPHY;  Surgeon: Early Osmond, MD;  Location: [facility];  Service: Cardiovascular;  Laterality: N/A;   TONSILLECTOMY  1986   TUBAL LIGATION     Unilateral oophorectomy with hysterectomy      Allergies  Allergen Reactions   Codeine    Hydrocodone Nausea And Vomiting   Sulfa Antibiotics Hives and Itching   Hydrocodone-Acetaminophen Nausea And Vomiting and Anxiety    Social History   Tobacco Use   Smoking status: Every Day    Packs/day: 0.50    Years: 35.00    Pack years: 17.50    Types: Cigarettes   Smokeless tobacco: Never   Tobacco comments:    1/3 of a pack 07/28/19  Substance Use Topics   Alcohol use: No    Family History  Problem Relation Age of Onset   Colon cancer Brother 63       died 1 year later  Heart attack Mother 45   Diabetes Sister    Prior to Admission medications   Medication Sig Start Date End Date Taking? Authorizing Provider  albuterol (PROVENTIL HFA;VENTOLIN HFA) 108 (90 BASE) MCG/ACT inhaler Inhale 2 puffs into the lungs every 6 (six) hours as needed for shortness of breath or wheezing.   Yes [provider]  atorvastatin (LIPITOR) 40 MG tablet Take 1 tablet (40 mg total) by mouth daily. 04/19/21 08/09/21 Yes Dahal, Marlowe Aschoff, MD  budesonide-formoterol (SYMBICORT) 80-4.5 MCG/ACT inhaler Take 2 puffs first thing in am and then another 2 puffs about 12 hours later. 07/10/21  Yes Tanda Rockers, MD  famotidine (PEPCID) 20 MG tablet One after supper 07/10/21  Yes Tanda Rockers,  MD  gabapentin (NEURONTIN) 300 MG capsule Take 300 mg by mouth 3 (three) times daily. 10/12/20  Yes [provider]  ondansetron (ZOFRAN-ODT) 8 MG disintegrating tablet Take 8 mg by mouth every 6 (six) hours as needed for vomiting or nausea. 06/26/21  Yes [provider]  OXYGEN Inhale 4 L into the lungs continuous as needed (shortness of breath).   Yes [provider]  pantoprazole (PROTONIX) 40 MG tablet Take 1 tablet (40 mg total) by mouth daily. Take 30-60 min before first meal of the day 07/10/21  Yes Tanda Rockers, MD  predniSONE (DELTASONE) 10 MG tablet Take  4 each am x 2 days,   2 each am x 2 days,  1 each am x 2 days and stop 07/10/21  Yes Tanda Rockers, MD  QUEtiapine (SEROQUEL) 100 MG tablet Take 100 mg by mouth at bedtime. 11/06/20  Yes [provider]  albuterol (PROVENTIL) (2.5 MG/3ML) 0.083% nebulizer solution Take 3 mLs (2.5 mg total) by nebulization every 4 (four) hours as needed for wheezing or shortness of breath. 07/10/21   Tanda Rockers, MD  guaiFENesin (MUCINEX) 600 MG 12 hr tablet Take 1 tablet (600 mg total) by mouth 2 (two) times daily. Patient taking differently: Take 600 mg by mouth 2 (two) times daily as needed for cough or to loosen phlegm. 04/11/14   Kathie Dike, MD  hydrOXYzine (ATARAX/VISTARIL) 50 MG tablet Take 50 mg by mouth 3 (three) times daily as needed for anxiety. 11/06/20   [provider]     Review of Systems  Positive ROS: As above  All other systems have been reviewed and were otherwise negative with the exception of those mentioned in the HPI and as above.  Objective: Vital signs in last 24 hours: Temp:  [98.9 F (37.2 C)] 98.9 F (37.2 C) (02/15 0916) Pulse Rate:  [80] 80 (02/15 0916) Resp:  [17] 17 (02/15 0916) BP: (117)/(82) 117/82 (02/15 0916) SpO2:  [100 %] 100 % (02/15 0916) Weight:  [55.8 kg] 55.8 kg (02/15 0916) Estimated body mass index is 21.11 kg/m as calculated from the following:    Height as of this encounter: 5\' 4"  (1.626 m).   Weight as of this encounter: 55.8 kg.   General Appearance: Alert Head: Normocephalic, without obvious abnormality, atraumatic Eyes: PERRL, conjunctiva/corneas clear, EOM's intact,    Ears: Normal  Throat: Normal  Neck: Decreased cervical range of motion.  Positive Spurling's testing. Back: unremarkable Lungs: Clear to auscultation bilaterally, respirations unlabored Heart: Regular rate and rhythm, no murmur, rub or gallop Abdomen: Soft, non-tender Extremities: Extremities normal, atraumatic, no cyanosis or edema Skin: unremarkable  NEUROLOGIC:   Mental status: alert and oriented,Motor Exam - grossly normal Sensory Exam - grossly normal Reflexes:  Coordination - grossly normal Gait - grossly normal Balance - grossly normal Cranial Nerves: I: smell Not tested  II: visual acuity  OS: Normal  OD: Normal   II: visual fields Full to confrontation  II: pupils Equal, round, reactive to light  III,VII: ptosis None  III,IV,VI: extraocular muscles  Full ROM  V: mastication Normal  V: facial light touch sensation  Normal  V,VII: corneal reflex  Present  VII: facial muscle function - upper  Normal  VII: facial muscle function - lower Normal  VIII: hearing Not tested  IX: soft palate elevation  Normal  IX,X: gag reflex Present  XI: trapezius strength  5/5  XI: sternocleidomastoid strength 5/5  XI: neck flexion strength  5/5  XII: tongue strength  Normal    Data Review Lab Results  Component Value Date   WBC 9.4 08/13/2021   HGB 12.8 08/13/2021   HCT 38.7 08/13/2021   MCV 88.6 08/13/2021   PLT 343 08/13/2021   Lab Results  Component Value Date   NA 142 08/13/2021   K 4.2 08/13/2021   CL 109 08/13/2021   CO2 27 08/13/2021   BUN 9 08/13/2021   CREATININE 0.69 08/13/2021   GLUCOSE 98 08/13/2021   Lab Results  Component Value Date   INR 1.1 08/30/2008    Assessment/Plan: Cervical spondylosis, cervicalgia, cervical  radiculopathy: I have discussed situation with the patient.  We discussed the various treatment options including surgery.  I have described the surgical treatment option of a C3-4, C4-5 and C5-6 anterior cervicectomy, fusion and plating.  I have shown her surgical models.  I have given her a surgical pamphlet.  We have discussed the risk, benefits, alternatives, expected postop course, and likelihood of achieving our goals with surgery.  I have answered all her questions.  She has decided proceed with surgery.   Ophelia Charter 08/15/2021 11:47 AM

## 2021-08-16 ENCOUNTER — Encounter (HOSPITAL_COMMUNITY): Payer: Self-pay | Admitting: Neurosurgery

## 2021-08-16 MED ORDER — DOCUSATE SODIUM 100 MG PO CAPS: 100.0000 mg | ORAL_CAPSULE | Freq: Two times a day (BID) | ORAL | 0 refills | Status: DC

## 2021-08-16 MED ORDER — CYCLOBENZAPRINE HCL 10 MG PO TABS: 10.0000 mg | ORAL_TABLET | Freq: Three times a day (TID) | ORAL | 0 refills | Status: DC | PRN

## 2021-08-16 MED ORDER — OXYCODONE-ACETAMINOPHEN 5-325 MG PO TABS: 1.0000 | ORAL_TABLET | ORAL | 0 refills | Status: AC | PRN

## 2021-08-16 NOTE — Progress Notes (Signed)
Patient awaiting transport via wheelchair by volunteer for discharge home; in no acute distress nor complaints of pain nor discomfort; incision on her neck with honeycomb dressing and is clean, dry and intact with Aspen collar on; room was checked and accounted for all her belongings; discharge instructions concerning her medications, incision care, follow up appointment and when to call the doctor as needed were all discussed with patient by RN and she expressed understanding on the instructions given.

## 2021-08-16 NOTE — Discharge Summary (Signed)
Physician Discharge Summary     Providing Compassionate, Quality Care - Together   Patient ID: [name] [name] MRN: [id number] DOB/AGE: [birth year] 61 y.o.  Admit date: 08/15/2021 Discharge date: 08/16/2021  Admission Diagnoses: Cervical spondylosis with radiculopathy  Discharge Diagnoses:  Principal Problem:   Cervical spondylosis with radiculopathy   Discharged Condition: good  Hospital Course: Patient underwent a C3-4, C4-5, C5-6 ACDF by Dr. Lovell Sheehan on 08/15/2021. She was admitted to 3C06 following recovery from anesthesia in the PACU. Her postoperative course has been uncomplicated. She has worked with both physical and occupational therapies who feel the patient is ready for discharge home. She is ambulating independently and without difficulty. She is tolerating a normal diet. She is not having any bowel or bladder dysfunction. Her pain is reasonably controlled with oral pain medication. She is ready for discharge home.   Consults: PT/OT  Significant Diagnostic Studies: radiology: DG Cervical Spine 2 or 3 views  Result Date: 08/15/2021 CLINICAL DATA:  Intraop imaging for C3-C6 ACDF EXAM: CERVICAL SPINE - 2-3 VIEW COMPARISON:  MRI cervical spine 07/25/2021 FINDINGS: Intraoperative images during C3-C6 ACDF. On the final image, hardware appears intact without evidence of loosening or immediate complication. Multiple coiled metallic wire-like opacities overlie the prevertebral soft tissues. IMPRESSION: Intraoperative images during C3-C6 ACDF. No evidence of immediate hardware complication. Electronically Signed   By: Caprice Renshaw M.D.   On: 08/15/2021 15:17     Treatments: surgery: C3-4, C4-5 and C5-6 anterior cervical discectomy/decompression; C3-4, C4-5 and C5-6 interbody arthrodesis with local morcellized autograft bone and Zimmer DBM; insertion of interbody prosthesis at C3-4, C4-5 and C5-6 (Zimmer peek interbody prosthesis); anterior cervical plating from C3-C6 with globus titanium  plate  Discharge Exam: Blood pressure 118/83, pulse 81, temperature 99.3 F (37.4 C), temperature source Oral, resp. rate 18, height 5\' 4"  (1.626 m), weight 55.8 kg, SpO2 100 %.  Alert and oriented x 4 PERRLA CN II-XII grossly intact MAE, Strength and sensation intact Incision is covered with Honeycomb dressing and Steri Strips; Dressing is clean, dry, and intact   Disposition:    Allergies as of 08/16/2021       Reactions   Codeine    Hydrocodone Nausea And Vomiting   Sulfa Antibiotics Hives, Itching   Hydrocodone-acetaminophen Nausea And Vomiting, Anxiety        Medication List     TAKE these medications    albuterol 108 (90 Base) MCG/ACT inhaler Commonly known as: VENTOLIN HFA Inhale 2 puffs into the lungs every 6 (six) hours as needed for shortness of breath or wheezing.   albuterol (2.5 MG/3ML) 0.083% nebulizer solution Commonly known as: PROVENTIL Take 3 mLs (2.5 mg total) by nebulization every 4 (four) hours as needed for wheezing or shortness of breath.   atorvastatin 40 MG tablet Commonly known as: LIPITOR Take 1 tablet (40 mg total) by mouth daily.   budesonide-formoterol 80-4.5 MCG/ACT inhaler Commonly known as: Symbicort Take 2 puffs first thing in am and then another 2 puffs about 12 hours later.   cyclobenzaprine 10 MG tablet Commonly known as: FLEXERIL Take 1 tablet (10 mg total) by mouth 3 (three) times daily as needed for muscle spasms.   docusate sodium 100 MG capsule Commonly known as: COLACE Take 1 capsule (100 mg total) by mouth 2 (two) times daily.   famotidine 20 MG tablet Commonly known as: Pepcid One after supper   gabapentin 300 MG capsule Commonly known as: NEURONTIN Take 300 mg by mouth 3 (three) times daily.  guaiFENesin 600 MG 12 hr tablet Commonly known as: MUCINEX Take 1 tablet (600 mg total) by mouth 2 (two) times daily. What changed:  when to take this reasons to take this   hydrOXYzine 50 MG tablet Commonly  known as: ATARAX Take 50 mg by mouth 3 (three) times daily as needed for anxiety.   ondansetron 8 MG disintegrating tablet Commonly known as: ZOFRAN-ODT Take 8 mg by mouth every 6 (six) hours as needed for vomiting or nausea.   oxyCODONE-acetaminophen 5-325 MG tablet Commonly known as: Percocet Take 1-2 tablets by mouth every 4 (four) hours as needed for up to 5 days for severe pain.   OXYGEN Inhale 4 L into the lungs continuous as needed (shortness of breath).   pantoprazole 40 MG tablet Commonly known as: Protonix Take 1 tablet (40 mg total) by mouth daily. Take 30-60 min before first meal of the day   predniSONE 10 MG tablet Commonly known as: DELTASONE Take  4 each am x 2 days,   2 each am x 2 days,  1 each am x 2 days and stop   QUEtiapine 100 MG tablet Commonly known as: SEROQUEL Take 100 mg by mouth at bedtime.        Follow-up Information     [facility] Follow up.   Specialty: Home Health Services Why: [facility] Contact information: [address] [place] [address] [phone]         Tressie Stalker, MD. Go on 09/11/2021.   Specialty: Neurosurgery Why: First post op appointment is on 09/11/2021 at 2:00 PM. Contact information: [address] [place] [address] [phone]                 Signed: Val Eagle, DNP, AGNP-C Nurse Practitioner  [facility] [address], [place], [place] [address] P: [phone]     F: [phone]  08/16/2021, 10:19 AM

## 2021-08-16 NOTE — Evaluation (Signed)
Occupational Therapy Evaluation Patient Details Name: [name] [name] MRN: [id number] DOB: [birth year] Today's Date: 08/16/2021   History of Present Illness This 61 y.o. female admitted for C3-4, C4-5, C5-6 ACDF due to cervical spondylosis with radiculopathy.   PMH Includes: OA, CAD, CHF, collagen vascular disease, COPD, h/o CVA, h/o PE,   Clinical Impression   Patient evaluated by Occupational Therapy with no further acute OT needs identified. All education has been completed and the patient has no further questions. Pt demonstrates good understanding of precautions and safety with ADLs.  See below for any follow-up Occupational Therapy or equipment needs. OT is signing off. Thank you for this referral.        Recommendations for follow up therapy are one component of a multi-disciplinary discharge planning process, led by the attending physician.  Recommendations may be updated based on patient status, additional functional criteria and insurance authorization.   Follow Up Recommendations  No OT follow up    Assistance Recommended at Discharge Intermittent Supervision/Assistance  Patient can return home with the following Assistance with cooking/housework;Assist for transportation    Functional Status Assessment  Patient has had a recent decline in their functional status and demonstrates the ability to make significant improvements in function in a reasonable and predictable amount of time.  Equipment Recommendations  None recommended by OT    Recommendations for Other Services       Precautions / Restrictions Precautions Precautions: Cervical Precaution Booklet Issued: Yes (comment) Precaution Comments: handout provided Required Braces or Orthoses: Cervical Brace Cervical Brace: Hard collar;At all times (when OOB, except when bathing/dressing)      Mobility Bed Mobility Overal bed mobility: Needs Assistance Bed Mobility: Rolling, Sidelying to Sit, Sit to  Sidelying Rolling: Supervision Sidelying to sit: Supervision     Sit to sidelying: Supervision General bed mobility comments: Pt demonstrates good technique    Transfers Overall transfer level: Modified independent                        Balance Overall balance assessment: No apparent balance deficits (not formally assessed)                                         ADL either performed or assessed with clinical judgement   ADL Overall ADL's : Needs assistance/impaired Eating/Feeding: Modified independent;Sitting   Grooming: Wash/dry hands;Wash/dry face;Oral care;Brushing hair;Supervision/safety;Standing Grooming Details (indicate cue type and reason): reviewed safety and precautions with ADLs Upper Body Bathing: Sitting;Supervision/ safety;Set up   Lower Body Bathing: Supervison/ safety;Sit to/from stand   Upper Body Dressing : Minimal assistance;Sitting Upper Body Dressing Details (indicate cue type and reason): assist with pulling shirt overhead.  Instructed pt to wear v-neck shirts or button front shirts to avoid flexing/extending neck Lower Body Dressing: Supervision/safety;Sit to/from stand   Toilet Transfer: Supervision/safety;Ambulation;Comfort height toilet;Grab bars   Toileting- Clothing Manipulation and Hygiene: Supervision/safety;Sit to/from stand     Tub/Shower Transfer Details (indicate cue type and reason): Instructed pt to sit to shower and she verbalized understanding Functional mobility during ADLs: Supervision/safety General ADL Comments: Discussed use of reacher with pt     Vision         Perception     Praxis      Pertinent Vitals/Pain Pain Assessment Pain Assessment: Faces Faces Pain Scale: Hurts whole lot Pain Descriptors / Indicators: Operative site guarding, Aching Pain  Intervention(s): Monitored during session, Repositioned, Patient requesting pain meds-RN notified     Hand Dominance Right    Extremity/Trunk Assessment Upper Extremity Assessment Upper Extremity Assessment: RUE deficits/detail RUE Deficits / Details: Pt reports long standing pain Rt shoulder which is the same as baseline.  ROM WFL, strength grossly at least 4/5   Lower Extremity Assessment Lower Extremity Assessment: Overall WFL for tasks assessed   Cervical / Trunk Assessment Cervical / Trunk Assessment: Neck Surgery   Communication     Cognition Arousal/Alertness: Awake/alert Behavior During Therapy: WFL for tasks assessed/performed Overall Cognitive Status: Within Functional Limits for tasks assessed                                       General Comments  Pt instructed how to don/doff cerviacal collar and how to change pads    Exercises     Shoulder Instructions      Home Living Family/patient expects to be discharged to:: Private residence Living Arrangements: Spouse/significant other Available Help at Discharge: Family;Available PRN/intermittently Type of Home: House Home Access: Stairs to enter [employer] of Steps: 2 Entrance Stairs-Rails: None Home Layout: One level     Bathroom Shower/Tub: Corporate investment banker: Standard     Home Equipment: Grab bars - tub/shower;Grab bars - toilet;Cane - single point;Cane - quad   Additional Comments: Pt lives with spouse, who works during the day.  Her sister will assist as needed      Prior Functioning/Environment Prior Level of Function : Independent/Modified Independent                        OT Problem List: Pain;Impaired UE functional use      OT Treatment/Interventions:      OT Goals(Current goals can be found in the care plan section) Acute Rehab OT Goals Patient Stated Goal: to go home OT Goal Formulation: All assessment and education complete, DC therapy  OT Frequency:      Co-evaluation              AM-PAC OT "6 Clicks" Daily Activity     Outcome Measure Help  from another person eating meals?: None Help from another person taking care of personal grooming?: A Little Help from another person toileting, which includes using toliet, bedpan, or urinal?: A Little Help from another person bathing (including washing, rinsing, drying)?: A Little Help from another person to put on and taking off regular upper body clothing?: A Little Help from another person to put on and taking off regular lower body clothing?: A Little 6 Click Score: 19   End of Session Equipment Utilized During Treatment: Cervical collar Nurse Communication: Mobility status;Patient requests pain meds  Activity Tolerance: Patient tolerated treatment well Patient left: in bed;with call bell/phone within reach  OT Visit Diagnosis: Pain Pain - Right/Left: Right Pain - part of body: Arm                Time: [id number] OT Time Calculation (min): 28 min Charges:  OT General Charges $OT Visit: 1 Visit OT Evaluation $OT Eval Moderate Complexity: 1 Mod OT Treatments $Self Care/Home Management : 8-22 mins  Nilsa Nutting., OTR/L Acute Rehabilitation Services Pager [phone] Office [phone]   Lucille Passy M 08/16/2021, 12:39 PM

## 2021-08-16 NOTE — TOC Transition Note (Signed)
Transition of Care [facility]) - CM/SW Discharge Note   Patient Details  Name: [name] [name] MRN: [id number] Date of Birth: 27-Jul-1960  Transition of Care [facility]) CM/SW Contact:  Kingsley Plan, RN Phone Number: 08/16/2021, 8:38 AM   Clinical Narrative:    Received message from Liborio Nixon, Dr Lovell Sheehan office has arranged home health services through [facility].     3C staff will provide any needed DME.    Transition of Care [facility]) Screening Note   Patient Details  Name: [name] [name] Date of Birth: 1961-06-16   Transition of Care Department [facility]) has reviewed patient and no TOC needs have been identified at this time. We will continue to monitor patient advancement through interdisciplinary progression rounds. If new patient transition needs arise, please place a TOC consult.     Patient Goals and CMS Choice        Discharge Placement                       Discharge Plan and Services                                     Social Determinants of Health (SDOH) Interventions     Readmission Risk Interventions No flowsheet data found.

## 2021-08-16 NOTE — Discharge Instructions (Signed)
Wound Care °Keep incision covered and dry for two days.    °Do not put any creams, lotions, or ointments on incision. °Leave steri-strips on back.  They will fall off by themselves. ° °Activity °Walk each and every day, increasing distance each day. °No lifting greater than 5 lbs.  Avoid excessive neck motion. °No driving for 2 weeks; may ride as a passenger locally. ° °Diet °Resume your normal diet. °  °Return to Work °Will be discussed at your follow up appointment. ° °Call Your Doctor If Any of These Occur °Redness, drainage, or swelling at the wound.  °Temperature greater than 101 degrees. °Severe pain not relieved by pain medication. °Incision starts to come apart. ° °Follow Up Appt °Call today for appointment in 3 weeks if you don't already have one ([phone]) or for problems.  If you have any hardware placed in your spine, you will need an x-ray before your appointment. ° °

## 2021-08-21 ENCOUNTER — Ambulatory Visit: Payer: Commercial Managed Care - HMO | Admitting: Internal Medicine

## 2021-08-21 NOTE — Progress Notes (Unsigned)
[name] [name], female    DOB: October 11, 1960   MRN: [id number]   Brief patient profile:  67  yowf active smoker  referred to pulmonary clinic in [place]  07/10/2021 by Maurene Capes PA for copd with symptoms starting around 2012  prev eval by Dr Vaughan Browner with GOLD 0 critieria/ AB phenotype  but requested office closer to home      History of Present Illness  07/10/2021  Pulmonary/ 1st office eval/ Foye Haggart / [name]  on advair 8 Chief Complaint  Patient presents with   New Patient (Initial Visit)    Patient here for copd and emphysema. Ref by cardiologist. States she is using 4LO2 cont. At night all night and prn during day. Coughing up green/brown mucus.   Dyspnea:  [employer] shopping / no HC parking / some limited by neck from houswork Cough: every night at hs after lie down and also while asleep x one year / augmentin best also helps nasal congestion/ coughs so hard gets fainty Sleep: bed is flat with pillows x 40 degrees with overt hb / on 02  SABA use: twice daily hfa/ neb once every 2 weeks Rec Plan A = Automatic = Always=    Symbicort 80 Take 2 puffs first thing in am and then another 2 puffs about 12 hours later.   Work on inhaler technique:    Plan B = Backup (to supplement plan A, not to replace it) Only use your albuterol inhaler as a rescue medication Plan C = Crisis (instead of Plan B but only if Plan B stops working) - only use your albuterol nebulizer if you first try Plan B and it fails to help  For cough /congestion > mucinex dm 1200 mg twice daily = 2400 mg total per 24 hours Prednisone 10 mg take  4 each am x 2 days,   2 each am x 2 days,  1 each am x 2 days and stop  Augmentin 875 mg take one pill twice daily  X 10 days - Pantoprazole (protonix) 40 mg   Take  30-60 min before first meal of the day and Pepcid (famotidine)  20 mg after supper until return to office - this is the best way to tell whether stomach acid is contributing to your problem.   GERD diet  reviewed, bed blocks rec  The key is to stop smoking completely before smoking completely stops you! Please schedule a follow up office visit in 6 weeks, call sooner if needed with all medications /inhalers/ solutions in hand      08/21/2021  f/u ov/[place] office/Daleysa Kristiansen re: GOLD 0/ AB maint on ***  No chief complaint on file.   Dyspnea:  *** Cough: *** Sleeping: *** SABA use: *** 02: *** Covid status: *** Lung cancer screening: ***   No obvious day to day or daytime variability or assoc excess/ purulent sputum or mucus plugs or hemoptysis or cp or chest tightness, subjective wheeze or overt sinus or hb symptoms.   *** without nocturnal  or early am exacerbation  of respiratory  c/o's or need for noct saba. Also denies any obvious fluctuation of symptoms with weather or environmental changes or other aggravating or alleviating factors except as outlined above   No unusual exposure hx or h/o childhood pna/ asthma or knowledge of premature birth.  Current Allergies, Complete Past Medical History, Past Surgical History, Family History, and Social History were reviewed in [employer] record.  ROS  The  following are not active complaints unless bolded Hoarseness, sore throat, dysphagia, dental problems, itching, sneezing,  nasal congestion or discharge of excess mucus or purulent secretions, ear ache,   fever, chills, sweats, unintended wt loss or wt gain, classically pleuritic or exertional cp,  orthopnea pnd or arm/hand swelling  or leg swelling, presyncope, palpitations, abdominal pain, anorexia, nausea, vomiting, diarrhea  or change in bowel habits or change in bladder habits, change in stools or change in urine, dysuria, hematuria,  rash, arthralgias, visual complaints, headache, numbness, weakness or ataxia or problems with walking or coordination,  change in mood or  memory.        No outpatient medications have been marked as taking for the 08/21/21 encounter  (Appointment) with Tanda Rockers, MD.              Past Medical History:  Diagnosis Date   Acid reflux    CAD (coronary artery disease)    S/P cath with stent in 2001   CHF (congestive heart failure) ([place])    Collagen vascular disease ([place])    COPD (chronic obstructive pulmonary disease) ([place])    CVA (cerebral vascular accident) ([place]) 1992   DU (duodenal ulcer) 09/19/2009   Hiatal hernia    EGD september 2007. normal except for small hiatal hernia. She underwent small bowel biopsy with history of diarrhea at that time. This was negative   History of colonoscopy    August 2007. She had a pedunculated polyp at 30 cm. which was hamartomatous polyp. She  is due for 5-year followup with family history of coln cancer in a brother at age 62   History of colonoscopy    2005 revealed a linear ulcer with scar versus inflammatory process at the mid right colon, but normal terminal ileum. A biopsy of this area revealed an ulceration, but nonspecific   History of coronary angiogram    CT angiogram in 2007. negative with normal mesenteric arteries   History of kidney stones    Migraine    Pulmonary embolus ([place]) 1999   Ulcerative colitis ([place])    Urinary frequency         Objective:     Wt Readings from Last 3 Encounters:  08/15/21 123 lb (55.8 kg)  08/13/21 123 lb 3.2 oz (55.9 kg)  07/10/21 122 lb 1.9 oz (55.4 kg)      Vital signs reviewed  08/21/2021  - Note at rest 02 sats  ***% on ***   General appearance:    ***  ,  Min ba***          Assessment

## 2021-08-28 ENCOUNTER — Emergency Department (HOSPITAL_COMMUNITY)
Admission: EM | Admit: 2021-08-28 | Discharge: 2021-08-28 | Discharge status: Against Medical Advice | Payer: Medicare Other | Attending: Emergency Medicine | Admitting: Emergency Medicine

## 2021-08-28 ENCOUNTER — Encounter (HOSPITAL_COMMUNITY): Payer: Self-pay | Admitting: Emergency Medicine

## 2021-08-28 ENCOUNTER — Other Ambulatory Visit: Payer: Self-pay

## 2021-08-28 ENCOUNTER — Emergency Department (HOSPITAL_COMMUNITY): Payer: Medicare Other

## 2021-08-28 DIAGNOSIS — Z7951 Long term (current) use of inhaled steroids: Secondary | ICD-10-CM | POA: Insufficient documentation

## 2021-08-28 DIAGNOSIS — M545 Low back pain, unspecified: Secondary | ICD-10-CM | POA: Diagnosis not present

## 2021-08-28 DIAGNOSIS — I2510 Atherosclerotic heart disease of native coronary artery without angina pectoris: Secondary | ICD-10-CM | POA: Diagnosis not present

## 2021-08-28 DIAGNOSIS — M542 Cervicalgia: Secondary | ICD-10-CM | POA: Diagnosis not present

## 2021-08-28 DIAGNOSIS — G8918 Other acute postprocedural pain: Secondary | ICD-10-CM | POA: Diagnosis not present

## 2021-08-28 DIAGNOSIS — M531 Cervicobrachial syndrome: Secondary | ICD-10-CM | POA: Diagnosis present

## 2021-08-28 LAB — BASIC METABOLIC PANEL
Anion gap: 7 (ref 5–15)
BUN: 15 mg/dL (ref 8–23)
CO2: 24 mmol/L (ref 22–32)
Calcium: 8.9 mg/dL (ref 8.9–10.3)
Chloride: 106 mmol/L (ref 98–111)
Creatinine, Ser: 0.67 mg/dL (ref 0.44–1.00)
GFR, Estimated: >60 mL/min (ref >60)
Glucose, Bld: 81 mg/dL (ref 70–99)
Potassium: 4.2 mmol/L (ref 3.5–5.1)
Sodium: 137 mmol/L (ref 135–145)

## 2021-08-28 LAB — CBC WITH DIFFERENTIAL/PLATELET
Abs Immature Granulocytes: 0.03 10*3/uL (ref 0.00–0.07)
Basophils Absolute: 0.1 10*3/uL (ref 0.0–0.1)
Basophils Relative: 1 %
Eosinophils Absolute: 0.8 10*3/uL — ABNORMAL HIGH (ref 0.0–0.5)
Eosinophils Relative: 9 %
HCT: 35.3 % — ABNORMAL LOW (ref 36.0–46.0)
Hemoglobin: 11.3 g/dL — ABNORMAL LOW (ref 12.0–15.0)
Immature Granulocytes: 0 %
Lymphocytes Relative: 33 %
Lymphs Abs: 2.9 10*3/uL (ref 0.7–4.0)
MCH: 28.8 pg (ref 26.0–34.0)
MCHC: 32 g/dL (ref 30.0–36.0)
MCV: 89.8 fL (ref 80.0–100.0)
Monocytes Absolute: 0.6 10*3/uL (ref 0.1–1.0)
Monocytes Relative: 7 %
Neutro Abs: 4.4 10*3/uL (ref 1.7–7.7)
Neutrophils Relative %: 50 %
Platelets: 391 10*3/uL (ref 150–400)
RBC: 3.93 MIL/uL (ref 3.87–5.11)
RDW: 14.3 % (ref 11.5–15.5)
WBC: 8.8 10*3/uL (ref 4.0–10.5)
nRBC: 0 % (ref 0.0–0.2)

## 2021-08-28 MED ORDER — DEXAMETHASONE SODIUM PHOSPHATE 10 MG/ML IJ SOLN
10.0000 mg | Freq: Once | INTRAMUSCULAR | Status: AC
  Administered 2021-08-28: 10 mg via INTRAVENOUS
  Filled 2021-08-28: qty 1

## 2021-08-28 MED ORDER — FENTANYL CITRATE PF 50 MCG/ML IJ SOSY
50.0000 ug | PREFILLED_SYRINGE | Freq: Once | INTRAMUSCULAR | Status: AC
  Administered 2021-08-28: 50 ug via INTRAVENOUS
  Filled 2021-08-28: qty 1

## 2021-08-28 MED ORDER — ONDANSETRON HCL 4 MG/2ML IJ SOLN
4.0000 mg | Freq: Once | INTRAMUSCULAR | Status: AC
  Administered 2021-08-28: 4 mg via INTRAVENOUS
  Filled 2021-08-28: qty 2

## 2021-08-28 MED ORDER — METHOCARBAMOL 500 MG PO TABS
500.0000 mg | ORAL_TABLET | Freq: Once | ORAL | Status: AC
  Administered 2021-08-28: 500 mg via ORAL
  Filled 2021-08-28: qty 1

## 2021-08-28 NOTE — ED Triage Notes (Signed)
Pt had cervical spine surgery with hardware x 2 weeks ago. Awoke this am with a different pain to neck, in both shoulders and radiating down back. A/o. C collar in place. Has been taking percocet 5 but pt states "they aint worth a crap".

## 2021-08-28 NOTE — ED Provider Notes (Signed)
[facility] EMERGENCY DEPARTMENT Provider Note   CSN: [id number] Arrival date & time: 08/28/21  1344     History  Chief Complaint  Patient presents with   Neck Pain    [name] [name] is a 61 y.o. female with a history including CAD, CVA, history of cervical spondylosis with radiculopathy presenting for evaluation of severe neck pain which radiates through her shoulders and into both of her arms to her elbows which she woke with today.  She is currently 2 weeks out from a  ACDF cervical decompression/discectomy fusion of C4-C6 by Dr. Arnoldo Morale.  She has been taking Percocet which states has not been relieving her pain since she woke with increased pain today.  She presents wearing her Aspen collar and states she has been wearing it since her surgery.  She denies any injury or falls.  She also denies fevers or chills, denies chest pain or shortness of breath.  She does endorse generalized weakness and has had poor p.o. intake over the past several days.  She took her last Percocet prior to arrival but states "it is not working".  She does states she is scheduled to see Dr. Arnoldo Morale tomorrow in follow-up.  The history is provided by the patient.      Home Medications Prior to Admission medications   Medication Sig Start Date End Date Taking? Authorizing Provider  albuterol (PROVENTIL HFA;VENTOLIN HFA) 108 (90 BASE) MCG/ACT inhaler Inhale 2 puffs into the lungs every 6 (six) hours as needed for shortness of breath or wheezing.    [provider]  albuterol (PROVENTIL) (2.5 MG/3ML) 0.083% nebulizer solution Take 3 mLs (2.5 mg total) by nebulization every 4 (four) hours as needed for wheezing or shortness of breath. 07/10/21   Tanda Rockers, MD  atorvastatin (LIPITOR) 40 MG tablet Take 1 tablet (40 mg total) by mouth daily. 04/19/21 08/09/21  Terrilee Croak, MD  budesonide-formoterol (SYMBICORT) 80-4.5 MCG/ACT inhaler Take 2 puffs first thing in am and then another 2 puffs about 12 hours  later. 07/10/21   Tanda Rockers, MD  cyclobenzaprine (FLEXERIL) 10 MG tablet Take 1 tablet (10 mg total) by mouth 3 (three) times daily as needed for muscle spasms. 08/16/21   Viona Gilmore D, NP  docusate sodium (COLACE) 100 MG capsule Take 1 capsule (100 mg total) by mouth 2 (two) times daily. 08/16/21   Viona Gilmore D, NP  famotidine (PEPCID) 20 MG tablet One after supper 07/10/21   Tanda Rockers, MD  gabapentin (NEURONTIN) 300 MG capsule Take 300 mg by mouth 3 (three) times daily. 10/12/20   [provider]  guaiFENesin (MUCINEX) 600 MG 12 hr tablet Take 1 tablet (600 mg total) by mouth 2 (two) times daily. Patient taking differently: Take 600 mg by mouth 2 (two) times daily as needed for cough or to loosen phlegm. 04/11/14   Kathie Dike, MD  hydrOXYzine (ATARAX/VISTARIL) 50 MG tablet Take 50 mg by mouth 3 (three) times daily as needed for anxiety. 11/06/20   [provider]  ondansetron (ZOFRAN-ODT) 8 MG disintegrating tablet Take 8 mg by mouth every 6 (six) hours as needed for vomiting or nausea. 06/26/21   [provider]  OXYGEN Inhale 4 L into the lungs continuous as needed (shortness of breath).    [provider]  pantoprazole (PROTONIX) 40 MG tablet Take 1 tablet (40 mg total) by mouth daily. Take 30-60 min before first meal of the day 07/10/21   Tanda Rockers, MD  predniSONE (DELTASONE) 10 MG tablet Take  4 each am x 2 days,   2 each am x 2 days,  1 each am x 2 days and stop 07/10/21   Tanda Rockers, MD  QUEtiapine (SEROQUEL) 100 MG tablet Take 100 mg by mouth at bedtime. 11/06/20   [provider]      Allergies    Codeine, Hydrocodone, Sulfa antibiotics, and Hydrocodone-acetaminophen    Review of Systems   Review of Systems  Constitutional:  Negative for chills and fever.  HENT:  Negative for congestion and sore throat.   Eyes: Negative.   Respiratory:  Negative for chest tightness and shortness of breath.   Cardiovascular:   Negative for chest pain.  Gastrointestinal:  Negative for abdominal pain and nausea.  Genitourinary: Negative.   Musculoskeletal:  Positive for neck pain. Negative for arthralgias and joint swelling.  Skin: Negative.  Negative for rash and wound.  Neurological:  Negative for dizziness, weakness, light-headedness, numbness and headaches.  Psychiatric/Behavioral: Negative.     Physical Exam Updated Vital Signs BP 105/73 (BP Location: Right Arm)    Pulse 62    Temp 98.7 F (37.1 C) (Oral)    Resp 20    SpO2 93%  Physical Exam Vitals and nursing note reviewed.  Constitutional:      Appearance: She is well-developed.  HENT:     Head: Normocephalic.  Eyes:     Conjunctiva/sclera: Conjunctivae normal.  Cardiovascular:     Rate and Rhythm: Normal rate.     Pulses: Normal pulses.     Comments: Pedal pulses normal. Pulmonary:     Effort: Pulmonary effort is normal.  Abdominal:     General: Bowel sounds are normal. There is no distension.     Palpations: Abdomen is soft. There is no mass.  Musculoskeletal:        General: Tenderness present.     Cervical back: Normal range of motion and neck supple. Tenderness and bony tenderness present. No swelling or erythema.     Lumbar back: Tenderness present. No swelling, edema or spasms.     Comments: Tenderness palpation along posterior C-spine and bilateral paracervical musculature.  No obvious spasm appreciated.  There is no edema or erythema.  No deformities appreciated.  Skin:    General: Skin is warm and dry.  Neurological:     General: No focal deficit present.     Mental Status: She is alert and oriented to person, place, and time.     Sensory: No sensory deficit.     Motor: No tremor or atrophy.     Gait: Gait normal.     Deep Tendon Reflexes:     Reflex Scores:      Patellar reflexes are 2+ on the right side and 2+ on the left side.      Achilles reflexes are 2+ on the right side and 2+ on the left side.    Comments: No strength  deficit noted in hip and knee flexor and extensor muscle groups.  Ankle flexion and extension intact.    ED Results / Procedures / Treatments   Labs (all labs ordered are listed, but only abnormal results are displayed) Labs Reviewed  CBC WITH DIFFERENTIAL/PLATELET - Abnormal; Notable for the following components:      Result Value   Hemoglobin 11.3 (*)    HCT 35.3 (*)    Eosinophils Absolute 0.8 (*)    All other components within normal limits  BASIC METABOLIC PANEL  EKG None  Radiology DG Cervical Spine 2 or 3 views  Result Date: 08/28/2021 CLINICAL DATA:  Neck pain EXAM: CERVICAL SPINE - 2-3 VIEW COMPARISON:  Intraoperative imaging 08/15/2021 FINDINGS: Changes of anterior fusion from C3-C6. Mild prevertebral soft tissue swelling anterior to the surgical levels. Diffuse degenerative disc and facet disease. No hardware complicating feature. No fracture or subluxation. IMPRESSION: Changes of ACDF C3-C6. Prevertebral soft tissue swelling anterior to the surgical levels. No acute bony abnormality. Electronically Signed   By: Rolm Baptise M.D.   On: 08/28/2021 18:34    Procedures Procedures    Medications Ordered in ED Medications  fentaNYL (SUBLIMAZE) injection 50 mcg (50 mcg Intravenous Given 08/28/21 1707)  ondansetron (ZOFRAN) injection 4 mg (4 mg Intravenous Given 08/28/21 1707)  dexamethasone (DECADRON) injection 10 mg (10 mg Intravenous Given 08/28/21 1832)  methocarbamol (ROBAXIN) tablet 500 mg (500 mg Oral Given 08/28/21 1832)    ED Course/ Medical Decision Making/ A&P                           Medical Decision Making Patient with exacerbation of neck and bilateral shoulder pain 2 weeks out from a ACDF surgery of C4-6.  No fevers, no weakness or numbness in her extremities but endorses generalized weakness and pain that is not being covered by her Percocet.  She is scheduled to see Dr. Arnoldo Morale tomorrow for follow-up postop care.  Amount and/or Complexity of Data  Reviewed Labs: ordered.    Details: CBC and c-Met obtained, she has a normal WBC count, electrolytes are normal. Radiology: ordered.    Details: C-spine 2 view obtained, no hardware disruption. Discussion of management or test interpretation with external provider(s): Discussed patient with Dr. Venetia Constable of neurosurgery.  He did recommend placing patient on steroids and muscle relaxers and plan follow-up with Dr. Arnoldo Morale tomorrow.  We discussed any imaging studies that would be helpful, he did recommend a AP and lateral C-spine, but would defer on any advanced imaging.  Risk Prescription drug management. Risk Details: Patient was given a dose of Decadron here, p.o. Robaxin, also given IV fentanyl 50 mcg.  Upon reassessment of patient after she was back from x-ray, she was not in her room.  She had not advised me or her RN that she was leaving.  She therefore did not receive a prescription for additional prednisone.  However she does have follow-up with Dr. Arnoldo Morale tomorrow.  It is unclear if pt left with her IV in - it was not removed by our staff.  RN contacted her sister Horris Latino who will go by her house and try to get her to come back in for this.            Final Clinical Impression(s) / ED Diagnoses Final diagnoses:  Post-op pain  Neck pain    Rx / DC Orders ED Discharge Orders     None         Landis Martins 08/28/21 1920    Jeanell Sparrow, DO 08/29/21 1521

## 2021-09-28 ENCOUNTER — Other Ambulatory Visit: Payer: Self-pay | Admitting: Internal Medicine

## 2021-09-28 DIAGNOSIS — J449 Chronic obstructive pulmonary disease, unspecified: Secondary | ICD-10-CM

## 2021-10-02 ENCOUNTER — Emergency Department (HOSPITAL_COMMUNITY): Payer: Medicare Other

## 2021-10-02 ENCOUNTER — Observation Stay (HOSPITAL_COMMUNITY)
Admission: EM | Admit: 2021-10-02 | Discharge: 2021-10-03 | Discharge status: Against Medical Advice | Payer: Medicare Other | Attending: Internal Medicine | Admitting: Internal Medicine

## 2021-10-02 ENCOUNTER — Other Ambulatory Visit: Payer: Self-pay

## 2021-10-02 ENCOUNTER — Encounter (HOSPITAL_COMMUNITY): Payer: Self-pay | Admitting: *Deleted

## 2021-10-02 DIAGNOSIS — J189 Pneumonia, unspecified organism: Principal | ICD-10-CM | POA: Diagnosis present

## 2021-10-02 DIAGNOSIS — I509 Heart failure, unspecified: Secondary | ICD-10-CM | POA: Insufficient documentation

## 2021-10-02 DIAGNOSIS — I2510 Atherosclerotic heart disease of native coronary artery without angina pectoris: Secondary | ICD-10-CM | POA: Diagnosis not present

## 2021-10-02 DIAGNOSIS — K219 Gastro-esophageal reflux disease without esophagitis: Secondary | ICD-10-CM | POA: Diagnosis present

## 2021-10-02 DIAGNOSIS — J449 Chronic obstructive pulmonary disease, unspecified: Secondary | ICD-10-CM | POA: Diagnosis not present

## 2021-10-02 DIAGNOSIS — Z79899 Other long term (current) drug therapy: Secondary | ICD-10-CM | POA: Diagnosis not present

## 2021-10-02 DIAGNOSIS — Z20822 Contact with and (suspected) exposure to covid-19: Secondary | ICD-10-CM | POA: Diagnosis not present

## 2021-10-02 DIAGNOSIS — Z8673 Personal history of transient ischemic attack (TIA), and cerebral infarction without residual deficits: Secondary | ICD-10-CM | POA: Diagnosis not present

## 2021-10-02 DIAGNOSIS — F17210 Nicotine dependence, cigarettes, uncomplicated: Secondary | ICD-10-CM | POA: Diagnosis not present

## 2021-10-02 DIAGNOSIS — R7989 Other specified abnormal findings of blood chemistry: Secondary | ICD-10-CM

## 2021-10-02 DIAGNOSIS — F1910 Other psychoactive substance abuse, uncomplicated: Secondary | ICD-10-CM

## 2021-10-02 DIAGNOSIS — R600 Localized edema: Secondary | ICD-10-CM

## 2021-10-02 DIAGNOSIS — R0602 Shortness of breath: Secondary | ICD-10-CM | POA: Diagnosis present

## 2021-10-02 LAB — CBC WITH DIFFERENTIAL/PLATELET
Abs Immature Granulocytes: 0.04 10*3/uL (ref 0.00–0.07)
Basophils Absolute: 0 10*3/uL (ref 0.0–0.1)
Basophils Relative: 1 %
Eosinophils Absolute: 0.2 10*3/uL (ref 0.0–0.5)
Eosinophils Relative: 2 %
HCT: 30.8 % — ABNORMAL LOW (ref 36.0–46.0)
Hemoglobin: 10.1 g/dL — ABNORMAL LOW (ref 12.0–15.0)
Immature Granulocytes: 1 %
Lymphocytes Relative: 26 %
Lymphs Abs: 2.1 10*3/uL (ref 0.7–4.0)
MCH: 29 pg (ref 26.0–34.0)
MCHC: 32.8 g/dL (ref 30.0–36.0)
MCV: 88.5 fL (ref 80.0–100.0)
Monocytes Absolute: 0.7 10*3/uL (ref 0.1–1.0)
Monocytes Relative: 9 %
Neutro Abs: 4.9 10*3/uL (ref 1.7–7.7)
Neutrophils Relative %: 61 %
Platelets: 352 10*3/uL (ref 150–400)
RBC: 3.48 MIL/uL — ABNORMAL LOW (ref 3.87–5.11)
RDW: 14.3 % (ref 11.5–15.5)
WBC: 8 10*3/uL (ref 4.0–10.5)
nRBC: 0 % (ref 0.0–0.2)

## 2021-10-02 LAB — COMPREHENSIVE METABOLIC PANEL
ALT: 11 U/L (ref 0–44)
AST: 13 U/L — ABNORMAL LOW (ref 15–41)
Albumin: 3.1 g/dL — ABNORMAL LOW (ref 3.5–5.0)
Alkaline Phosphatase: 83 U/L (ref 38–126)
Anion gap: 8 (ref 5–15)
BUN: 14 mg/dL (ref 8–23)
CO2: 26 mmol/L (ref 22–32)
Calcium: 8.6 mg/dL — ABNORMAL LOW (ref 8.9–10.3)
Chloride: 103 mmol/L (ref 98–111)
Creatinine, Ser: 0.61 mg/dL (ref 0.44–1.00)
GFR, Estimated: >60 mL/min (ref >60)
Glucose, Bld: 157 mg/dL — ABNORMAL HIGH (ref 70–99)
Potassium: 3.9 mmol/L (ref 3.5–5.1)
Sodium: 137 mmol/L (ref 135–145)
Total Bilirubin: 0.6 mg/dL (ref 0.3–1.2)
Total Protein: 6.6 g/dL (ref 6.5–8.1)

## 2021-10-02 LAB — RESP PANEL BY RT-PCR (FLU A&B, COVID) ARPGX2
Influenza A by PCR: NEGATIVE
Influenza B by PCR: NEGATIVE
SARS Coronavirus 2 by RT PCR: NEGATIVE

## 2021-10-02 LAB — BRAIN NATRIURETIC PEPTIDE: B Natriuretic Peptide: 150 pg/mL — ABNORMAL HIGH (ref 0.0–100.0)

## 2021-10-02 LAB — TROPONIN I (HIGH SENSITIVITY)
Troponin I (High Sensitivity): 3 ng/L (ref <18)
Troponin I (High Sensitivity): 2 ng/L (ref <18)

## 2021-10-02 LAB — D-DIMER, QUANTITATIVE: D-Dimer, Quant: 2.89 ug/mL-FEU — ABNORMAL HIGH (ref 0.00–0.50)

## 2021-10-02 MED ORDER — ONDANSETRON HCL 4 MG/2ML IJ SOLN
4.0000 mg | Freq: Once | INTRAMUSCULAR | Status: AC
  Administered 2021-10-02: 4 mg via INTRAVENOUS
  Filled 2021-10-02: qty 2

## 2021-10-02 MED ORDER — SODIUM CHLORIDE 0.9 % IV SOLN
1.0000 g | Freq: Once | INTRAVENOUS | Status: AC
  Administered 2021-10-02: 1 g via INTRAVENOUS
  Filled 2021-10-02: qty 10

## 2021-10-02 MED ORDER — HEPARIN SODIUM (PORCINE) 5000 UNIT/ML IJ SOLN
5000.0000 [IU] | Freq: Three times a day (TID) | INTRAMUSCULAR | Status: DC
  Administered 2021-10-03: 5000 [IU] via SUBCUTANEOUS
  Filled 2021-10-02: qty 1

## 2021-10-02 MED ORDER — FENTANYL CITRATE PF 50 MCG/ML IJ SOSY
50.0000 ug | PREFILLED_SYRINGE | Freq: Once | INTRAMUSCULAR | Status: AC
  Administered 2021-10-02: 50 ug via INTRAVENOUS
  Filled 2021-10-02: qty 1

## 2021-10-02 MED ORDER — IOHEXOL 350 MG/ML SOLN
100.0000 mL | Freq: Once | INTRAVENOUS | Status: AC | PRN
  Administered 2021-10-02: 75 mL via INTRAVENOUS

## 2021-10-02 MED ORDER — GUAIFENESIN ER 600 MG PO TB12
600.0000 mg | ORAL_TABLET | Freq: Two times a day (BID) | ORAL | Status: DC | PRN
  Administered 2021-10-02: 600 mg via ORAL
  Filled 2021-10-02: qty 1

## 2021-10-02 MED ORDER — HYDROXYZINE HCL 25 MG PO TABS: 50.0000 mg | ORAL_TABLET | Freq: Three times a day (TID) | ORAL | Status: DC | PRN

## 2021-10-02 MED ORDER — MOMETASONE FURO-FORMOTEROL FUM 100-5 MCG/ACT IN AERO: 2.0000 | INHALATION_SPRAY | Freq: Two times a day (BID) | RESPIRATORY_TRACT | Status: DC

## 2021-10-02 MED ORDER — SODIUM CHLORIDE 0.9 % IV SOLN: 2.0000 g | INTRAVENOUS | Status: DC

## 2021-10-02 MED ORDER — SODIUM CHLORIDE 0.9 % IV SOLN
500.0000 mg | INTRAVENOUS | Status: DC
  Administered 2021-10-02: 500 mg via INTRAVENOUS
  Filled 2021-10-02: qty 5

## 2021-10-02 MED ORDER — NICOTINE 21 MG/24HR TD PT24
21.0000 mg | MEDICATED_PATCH | Freq: Every day | TRANSDERMAL | Status: DC
  Administered 2021-10-02: 21 mg via TRANSDERMAL

## 2021-10-02 MED ORDER — GABAPENTIN 300 MG PO CAPS
300.0000 mg | ORAL_CAPSULE | Freq: Three times a day (TID) | ORAL | Status: DC
Start: 2021-10-02 — End: 2021-10-03
  Administered 2021-10-02: 300 mg via ORAL
  Filled 2021-10-02: qty 1

## 2021-10-02 MED ORDER — SODIUM CHLORIDE 0.9 % IV BOLUS
500.0000 mL | Freq: Once | INTRAVENOUS | Status: AC
  Administered 2021-10-02: 500 mL via INTRAVENOUS

## 2021-10-02 MED ORDER — FAMOTIDINE 20 MG PO TABS: 20.0000 mg | ORAL_TABLET | Freq: Every day | ORAL | Status: DC

## 2021-10-02 MED ORDER — QUETIAPINE FUMARATE 100 MG PO TABS
100.0000 mg | ORAL_TABLET | Freq: Every day | ORAL | Status: DC
  Administered 2021-10-02: 100 mg via ORAL
  Filled 2021-10-02: qty 1

## 2021-10-02 MED ORDER — ALBUTEROL SULFATE (2.5 MG/3ML) 0.083% IN NEBU: 2.5000 mg | INHALATION_SOLUTION | RESPIRATORY_TRACT | Status: DC | PRN

## 2021-10-02 MED ORDER — IPRATROPIUM-ALBUTEROL 0.5-2.5 (3) MG/3ML IN SOLN
3.0000 mL | Freq: Once | RESPIRATORY_TRACT | Status: AC
  Administered 2021-10-02: 3 mL via RESPIRATORY_TRACT

## 2021-10-02 MED ORDER — IPRATROPIUM-ALBUTEROL 0.5-2.5 (3) MG/3ML IN SOLN: 3.0000 mL | Freq: Four times a day (QID) | RESPIRATORY_TRACT | Status: DC | PRN

## 2021-10-02 MED ORDER — PANTOPRAZOLE SODIUM 40 MG PO TBEC: 40.0000 mg | DELAYED_RELEASE_TABLET | Freq: Every day | ORAL | Status: DC

## 2021-10-02 NOTE — ED Notes (Signed)
Resting quietly. No signs of distress. Side rails up. Call bell in reach. Awaiting CT.  °

## 2021-10-02 NOTE — ED Notes (Signed)
Placed pt on 2 liters Early'' for comfort measures. Pt spo2 is 100 at this time. Nurse notified ?

## 2021-10-02 NOTE — ED Notes (Signed)
Pt transported to CT ?

## 2021-10-02 NOTE — ED Triage Notes (Signed)
Recently diagnosed with pneumonia over a week ago, advised to stay in the hospital but did not wish to be admitted ?

## 2021-10-02 NOTE — ED Provider Notes (Signed)
? ?Emergency Department Provider Note ? ? ?I have reviewed the triage vital signs and the nursing notes. ? ? ?HISTORY ? ?Chief Complaint ?Shortness of Breath ? ? ?HPI ?[name] [name] is a 61 y.o. female with past medical history reviewed below including COPD, prior stroke, CAD, prior pneumonia presents with right-sided chest pain and cough.  Patient reports her cough been productive.  No hemoptysis.  She states she was seen in outside emergency department and diagnosed with pneumonia.  She was advised to stay in the hospital but ultimately went home with antibiotics.  She has been taking those for the past 4 days with no improvement.  The right-sided chest pain is constant and worse. No chills. Positive subjective fever. Family at bedside report COPD "flaring up" as well.  ? ? ?Past Medical History:  ?Diagnosis Date  ? Acid reflux   ? Arthritis   ? CAD (coronary artery disease)   ? S/P cath with stent in 2001  ? CHF (congestive heart failure) ([place])   ? Collagen vascular disease ([place])   ? COPD (chronic obstructive pulmonary disease) ([place])   ? CVA (cerebral vascular accident) ([place]) 1992  ? DU (duodenal ulcer) 09/19/2009  ? Hiatal hernia   ? EGD september 2007. normal except for small hiatal hernia. She underwent small bowel biopsy with history of diarrhea at that time. This was negative  ? History of colonoscopy   ? August 2007. She had a pedunculated polyp at 30 cm. which was hamartomatous polyp. She  is due for 5-year followup with family history of coln cancer in a brother at age 79  ? History of colonoscopy   ? 2005 revealed a linear ulcer with scar versus inflammatory process at the mid right colon, but normal terminal ileum. A biopsy of this area revealed an ulceration, but nonspecific  ? History of coronary angiogram   ? CT angiogram in 2007. negative with normal mesenteric arteries  ? History of kidney stones   ? Migraine   ? Pneumonia   ? pt states she gets pneumonia about twice a year  ? Pulmonary embolus  ([place]) 1999  ? Ulcerative colitis ([place])   ? Urinary frequency   ? ? ?Review of Systems ? ?Constitutional: No fever/chills ?Eyes: No visual changes. ?ENT: No sore throat. ?Cardiovascular: Positive chest pain. ?Respiratory: Positive shortness of breath. ?Gastrointestinal: No abdominal pain.  No nausea, no vomiting.  No diarrhea.  No constipation. ?Genitourinary: Negative for dysuria. ?Musculoskeletal: Negative for back pain. ?Skin: Negative for rash. ?Neurological: Negative for headaches, focal weakness or numbness. ? ? ?____________________________________________ ? ? ?PHYSICAL EXAM: ? ?VITAL SIGNS: ?ED Triage Vitals  ?Enc Vitals Group  ?   BP 10/02/21 1505 (!) 85/55  ?   Pulse Rate 10/02/21 1505 88  ?   Resp 10/02/21 1505 18  ?   Temp 10/02/21 1505 98.1 ?F (36.7 ?C)  ?   Temp Source 10/02/21 1505 Oral  ?   SpO2 10/02/21 1505 98 %  ?   Weight 10/02/21 1508 124 lb (56.2 kg)  ?   Height 10/02/21 1508 5\' 4"  (1.626 m)  ? ?Constitutional: Alert and oriented. Patient clutching at the right chest and appears uncomfortable.  ?Eyes: Conjunctivae are normal.  ?Head: Atraumatic. ?Nose: No congestion/rhinnorhea. ?Mouth/Throat: Mucous membranes are moist.   ?Neck: No stridor.   ?Cardiovascular: Normal rate, regular rhythm. Good peripheral circulation. Grossly normal heart sounds.   ?Respiratory: Normal respiratory effort.  No retractions. Lungs with mild wheezing bilaterally. No rales or  rhonchi.  ?Gastrointestinal: Soft and nontender. No distention.  ?Musculoskeletal: No lower extremity tenderness nor edema. No gross deformities of extremities. ?Neurologic:  Normal speech and language. No gross focal neurologic deficits are appreciated.  ?Skin:  Skin is warm, dry and intact. No rash noted. ? ? ?____________________________________________ ?  ?LABS ?(all labs ordered are listed, but only abnormal results are displayed) ? ?Labs Reviewed  ?COMPREHENSIVE METABOLIC PANEL - Abnormal; Notable for the following components:  ?    Result  Value  ? Glucose, Bld 157 (*)   ? Calcium 8.6 (*)   ? Albumin 3.1 (*)   ? AST 13 (*)   ? All other components within normal limits  ?CBC WITH DIFFERENTIAL/PLATELET - Abnormal; Notable for the following components:  ? RBC 3.48 (*)   ? Hemoglobin 10.1 (*)   ? HCT 30.8 (*)   ? All other components within normal limits  ?BRAIN NATRIURETIC PEPTIDE - Abnormal; Notable for the following components:  ? B Natriuretic Peptide 150.0 (*)   ? All other components within normal limits  ?D-DIMER, QUANTITATIVE - Abnormal; Notable for the following components:  ? D-Dimer, Quant 2.89 (*)   ? All other components within normal limits  ?RAPID URINE DRUG SCREEN, HOSP PERFORMED - Abnormal; Notable for the following components:  ? Benzodiazepines POSITIVE (*)   ? Amphetamines POSITIVE (*)   ? Tetrahydrocannabinol POSITIVE (*)   ? All other components within normal limits  ?RESP PANEL BY RT-PCR (FLU A&B, COVID) [id number]  ?CULTURE, BLOOD (ROUTINE X 2)  ?CULTURE, BLOOD (ROUTINE X 2)  ?EXPECTORATED SPUTUM ASSESSMENT W GRAM STAIN, RFLX TO RESP C  ?STREP PNEUMONIAE URINARY ANTIGEN  ?LEGIONELLA PNEUMOPHILA SEROGP 1 UR AG  ?TROPONIN I (HIGH SENSITIVITY)  ?TROPONIN I (HIGH SENSITIVITY)  ? ?____________________________________________ ? ?EKG ? ? EKG Interpretation ? ?Date/Time:  Tuesday October 02 2021 15:36:06 EDT ?Ventricular Rate:  79 ?PR Interval:  129 ?QRS Duration: 93 ?QT Interval:  405 ?QTC Calculation: 465 ?R Axis:   84 ?Text Interpretation: Sinus rhythm Borderline right axis deviation Confirmed by Nanda Quinton [phone]) on 10/02/2021 4:01:03 PM ?  ? ?  ? ? ?____________________________________________ ? ?RADIOLOGY ? ?DG Chest 2 View ? ?Result Date: 10/02/2021 ?CLINICAL DATA:  CP/SOB EXAM: CHEST - 2 VIEW COMPARISON:  Chest x-ray 09/27/2021, CT chest 09/27/2021 FINDINGS: The heart and mediastinal contours are unchanged. Aortic calcification. Vague patchy airspace opacities. Chronic coarsened interstitial markings with no overt pulmonary edema. Trace  right pleural effusion. No pneumothorax. No acute osseous abnormality. IMPRESSION: 1. Vague patchy airspace opacities that are better evaluated on CT chest 09/27/2021. 2. Trace right pleural effusion. 3. Aortic Atherosclerosis (ICD10-I70.0) and Emphysema (ICD10-J43.9). Electronically Signed   By: Iven Finn M.D.   On: 10/02/2021 16:35  ? ?CT Angio Chest PE W and/or Wo Contrast ? ?Result Date: 10/02/2021 ?CLINICAL DATA:  Pt c/o increased SOB X 3 days COPD pt, recently dx with PN. Pulmonary embolism (PE) suspected, positive D-dimer. EXAM: CT ANGIOGRAPHY CHEST WITH CONTRAST TECHNIQUE: Multidetector CT imaging of the chest was performed using the standard protocol during bolus administration of intravenous contrast. Multiplanar CT image reconstructions and MIPs were obtained to evaluate the vascular anatomy. RADIATION DOSE REDUCTION: This exam was performed according to the departmental dose-optimization program which includes automated exposure control, adjustment of the mA and/or kV according to patient size and/or use of iterative reconstruction technique. IV infiltrated approximately 86mL contrast in the upper extremity. CONTRAST:  4mL OMNIPAQUE IOHEXOL 350 MG/ML SOLN COMPARISON:  Chest x-ray 11/01/2021, CT  chest 09/27/2021 FINDINGS: Cardiovascular: Satisfactory opacification of the pulmonary arteries to the segmental level. No evidence of pulmonary embolism. Normal heart size. No significant pericardial effusion. The thoracic aorta is normal in caliber. Mild atherosclerotic plaque of the thoracic aorta. Three-vessel coronary artery calcifications. Mediastinum/Nodes: No enlarged mediastinal, hilar, or axillary lymph nodes. Thyroid gland, trachea, and esophagus demonstrate no significant findings. Lungs/Pleura: Paraseptal and centrilobular emphysematous changes. Biapical pleural/pulmonary scarring. Interval worsening of diffuse peribronchovascular ground-glass airspace opacities. Interval development of a more  consolidative right lower lobe consolidation with air bronchograms. No pulmonary nodule. No pulmonary mass. Interval development of a trace left pleural effusion. No right pleural effusion. No pneumothora

## 2021-10-03 ENCOUNTER — Ambulatory Visit: Payer: Medicaid Other | Admitting: Internal Medicine

## 2021-10-03 DIAGNOSIS — J189 Pneumonia, unspecified organism: Secondary | ICD-10-CM

## 2021-10-03 DIAGNOSIS — R600 Localized edema: Secondary | ICD-10-CM

## 2021-10-03 DIAGNOSIS — K219 Gastro-esophageal reflux disease without esophagitis: Secondary | ICD-10-CM

## 2021-10-03 DIAGNOSIS — J441 Chronic obstructive pulmonary disease with (acute) exacerbation: Secondary | ICD-10-CM

## 2021-10-03 DIAGNOSIS — F1910 Other psychoactive substance abuse, uncomplicated: Secondary | ICD-10-CM

## 2021-10-03 DIAGNOSIS — R7989 Other specified abnormal findings of blood chemistry: Secondary | ICD-10-CM

## 2021-10-03 LAB — RAPID URINE DRUG SCREEN, HOSP PERFORMED
Amphetamines: POSITIVE — AB
Barbiturates: NOT DETECTED
Benzodiazepines: POSITIVE — AB
Cocaine: NOT DETECTED
Opiates: NOT DETECTED
Tetrahydrocannabinol: POSITIVE — AB

## 2021-10-03 LAB — STREP PNEUMONIAE URINARY ANTIGEN: Strep Pneumo Urinary Antigen: NEGATIVE

## 2021-10-03 MED ORDER — OXYCODONE HCL 5 MG PO TABS
5.0000 mg | ORAL_TABLET | Freq: Four times a day (QID) | ORAL | Status: DC | PRN
  Filled 2021-10-03: qty 1

## 2021-10-03 MED ORDER — OXYCODONE HCL 5 MG PO TABS
5.0000 mg | ORAL_TABLET | Freq: Four times a day (QID) | ORAL | Status: DC | PRN
  Administered 2021-10-03: 5 mg via ORAL
  Filled 2021-10-03: qty 1

## 2021-10-03 NOTE — Assessment & Plan Note (Signed)
-   Continue Pepcid and Protonix 

## 2021-10-03 NOTE — Assessment & Plan Note (Signed)
From IV infiltration during CAT scan ?Continue ice pack ?

## 2021-10-03 NOTE — Discharge Summary (Signed)
Physician Discharge Summary  ?[name] [name] GTX:[id number] DOB: 10/01/1960 DOA: 10/02/2021 ? ?PCP: Lianne Moris, PA-C ? ?Admit date: 10/02/2021 ?Discharge date: 10/03/2021 ? ?Admitted From: Home ?Disposition: Home ? ?Recommendations for Outpatient Follow-up:  ?Follow up with PCP in 1-2 weeks ? ?Brief/Interim Summary: ?[name] [name] is a 61 y.o. female with medical history significant of history of acid reflux, coronary artery disease, CHF, COPD, CVA, pulmonary embolus, ulcerative colitis, migraine, and more presents ED with complaint of dyspnea.  Per report patient was previously seen for pneumonia at outside facility but left AMA due to "not wanting a central line" -in the interim she was noted to have snorted oxycodone prior to admission required Narcan and there was concern for worsening aspiration on top of previously diagnosed pneumonia.  Unfortunately prior to evaluation this morning patient left the ED AMA due to her narcotics being held.  Attempted to evaluate the patient and discuss the reason for her held medications were due to her polypharmacy and requirement of Narcan at intake. ? ?Again patient left AGAINST MEDICAL ADVICE prior to being evaluated today 10/03/2021. ? ?Discharge Diagnoses:  ?Principal Problem: ?  Pneumonia ?Active Problems: ?  Cigarette smoker ?  GERD ?  Chronic obstructive pulmonary disease (COPD) (HCC) ?  Substance abuse (HCC) ?  Edema of right upper arm ?  Elevated d-dimer ? ? ? ?Discharge Instructions ? ? ?Allergies as of 10/03/2021   ? ?   Reactions  ? Codeine   ? Hydrocodone Nausea And Vomiting  ? Sulfa Antibiotics Hives, Itching  ? Hydrocodone-acetaminophen Nausea And Vomiting, Anxiety  ? ?  ? ?  ?Medication List  ?  ? ?ASK your doctor about these medications   ? ?albuterol 108 (90 Base) MCG/ACT inhaler ?Commonly known as: VENTOLIN HFA ?Inhale 2 puffs into the lungs every 6 (six) hours as needed for shortness of breath or wheezing. ?  ?albuterol (2.5 MG/3ML) 0.083% nebulizer  solution ?Commonly known as: PROVENTIL ?Take 3 mLs (2.5 mg total) by nebulization every 4 (four) hours as needed for wheezing or shortness of breath. ?  ?atorvastatin 40 MG tablet ?Commonly known as: LIPITOR ?Take 1 tablet (40 mg total) by mouth daily. ?  ?budesonide-formoterol 80-4.5 MCG/ACT inhaler ?Commonly known as: Symbicort ?Take 2 puffs first thing in am and then another 2 puffs about 12 hours later. ?  ?cyclobenzaprine 10 MG tablet ?Commonly known as: FLEXERIL ?Take 1 tablet (10 mg total) by mouth 3 (three) times daily as needed for muscle spasms. ?  ?docusate sodium 100 MG capsule ?Commonly known as: COLACE ?Take 1 capsule (100 mg total) by mouth 2 (two) times daily. ?  ?famotidine 20 MG tablet ?Commonly known as: Pepcid ?One after supper ?  ?gabapentin 300 MG capsule ?Commonly known as: NEURONTIN ?Take 300 mg by mouth 3 (three) times daily. ?  ?guaiFENesin 600 MG 12 hr tablet ?Commonly known as: MUCINEX ?Take 1 tablet (600 mg total) by mouth 2 (two) times daily. ?  ?hydrOXYzine 50 MG tablet ?Commonly known as: ATARAX ?Take 50 mg by mouth 3 (three) times daily as needed for anxiety. ?  ?ondansetron 8 MG disintegrating tablet ?Commonly known as: ZOFRAN-ODT ?Take 8 mg by mouth every 6 (six) hours as needed for vomiting or nausea. ?  ?oxyCODONE 5 MG immediate release tablet ?Commonly known as: Oxy IR/ROXICODONE ?Take 5 mg by mouth every 6 (six) hours as needed for moderate pain. ?  ?OXYGEN ?Inhale 4 L into the lungs continuous as needed (shortness of breath). ?  ?pantoprazole  40 MG tablet ?Commonly known as: PROTONIX ?TAKE ONE TABLET BY MOUTH DAILY. TAKE 30-60 MINUTES BEFORE FIRST MEAL OF THE DAY. ?  ?predniSONE 10 MG tablet ?Commonly known as: DELTASONE ?Take  4 each am x 2 days,   2 each am x 2 days,  1 each am x 2 days and stop ?  ?QUEtiapine 100 MG tablet ?Commonly known as: SEROQUEL ?Take 100 mg by mouth at bedtime. ?  ? ?  ? ? ?Allergies  ?Allergen Reactions  ? Codeine   ? Hydrocodone Nausea And Vomiting   ? Sulfa Antibiotics Hives and Itching  ? Hydrocodone-Acetaminophen Nausea And Vomiting and Anxiety  ? ? ?Procedures/Studies: ?DG Chest 2 View ? ?Result Date: 10/02/2021 ?CLINICAL DATA:  CP/SOB EXAM: CHEST - 2 VIEW COMPARISON:  Chest x-ray 09/27/2021, CT chest 09/27/2021 FINDINGS: The heart and mediastinal contours are unchanged. Aortic calcification. Vague patchy airspace opacities. Chronic coarsened interstitial markings with no overt pulmonary edema. Trace right pleural effusion. No pneumothorax. No acute osseous abnormality. IMPRESSION: 1. Vague patchy airspace opacities that are better evaluated on CT chest 09/27/2021. 2. Trace right pleural effusion. 3. Aortic Atherosclerosis (ICD10-I70.0) and Emphysema (ICD10-J43.9). Electronically Signed   By: Tish Frederickson M.D.   On: 10/02/2021 16:35  ? ?CT Angio Chest PE W and/or Wo Contrast ? ?Result Date: 10/02/2021 ?CLINICAL DATA:  Pt c/o increased SOB X 3 days COPD pt, recently dx with PN. Pulmonary embolism (PE) suspected, positive D-dimer. EXAM: CT ANGIOGRAPHY CHEST WITH CONTRAST TECHNIQUE: Multidetector CT imaging of the chest was performed using the standard protocol during bolus administration of intravenous contrast. Multiplanar CT image reconstructions and MIPs were obtained to evaluate the vascular anatomy. RADIATION DOSE REDUCTION: This exam was performed according to the departmental dose-optimization program which includes automated exposure control, adjustment of the mA and/or kV according to patient size and/or use of iterative reconstruction technique. IV infiltrated approximately 36mL contrast in the upper extremity. CONTRAST:  54mL OMNIPAQUE IOHEXOL 350 MG/ML SOLN COMPARISON:  Chest x-ray 11/01/2021, CT chest 09/27/2021 FINDINGS: Cardiovascular: Satisfactory opacification of the pulmonary arteries to the segmental level. No evidence of pulmonary embolism. Normal heart size. No significant pericardial effusion. The thoracic aorta is normal in caliber. Mild  atherosclerotic plaque of the thoracic aorta. Three-vessel coronary artery calcifications. Mediastinum/Nodes: No enlarged mediastinal, hilar, or axillary lymph nodes. Thyroid gland, trachea, and esophagus demonstrate no significant findings. Lungs/Pleura: Paraseptal and centrilobular emphysematous changes. Biapical pleural/pulmonary scarring. Interval worsening of diffuse peribronchovascular ground-glass airspace opacities. Interval development of a more consolidative right lower lobe consolidation with air bronchograms. No pulmonary nodule. No pulmonary mass. Interval development of a trace left pleural effusion. No right pleural effusion. No pneumothorax. Upper Abdomen: No acute abnormality. Musculoskeletal: No abdominal wall hernia or abnormality. No suspicious lytic or blastic osseous lesions. No acute displaced fracture. Cortical irregularity of the distal sternal body likely old healed fracture. Review of the MIP images confirms the above findings. IMPRESSION: 1. No pulmonary embolus. 2. Interval worsening multifocal pneumonia. Recommend follow-up CT in 3 months to evaluate for resolution and exclude underlying malignancy. 3.  Interval development of a trace left pleural effusion. 4. Aortic Atherosclerosis (ICD10-I70.0) and Emphysema (ICD10-J43.9). 5. Of note, per CT tech IV infiltrated approximately 20mL contrast in the upper extremity. Electronically Signed   By: Tish Frederickson M.D.   On: 10/02/2021 20:57   ? ? ?Discharge Exam: ?Vitals:  ? 10/03/21 0930 10/03/21 0937  ?BP:    ?Pulse: 79 86  ?Resp: (!) 9 19  ?Temp:    ?  SpO2: 100% 100%  ? ?Vitals:  ? 10/03/21 0900 10/03/21 0915 10/03/21 0930 10/03/21 0937  ?BP:  104/74    ?Pulse: 80 78 79 86  ?Resp: 10 15 (!) 9 19  ?Temp:      ?TempSrc:      ?SpO2: 99% 100% 100% 100%  ?Weight:      ?Height:      ? ? ?The results of significant diagnostics from this hospitalization (including imaging, microbiology, ancillary and laboratory) are listed below for reference.    ? ? ?Microbiology: ?Recent Results (from the past 240 hour(s))  ?Resp Panel by RT-PCR (Flu A&B, Covid) Nasopharyngeal Swab     Status: None  ? Collection Time: 10/02/21  4:55 PM  ? Specimen: Nasopharyngeal Swa

## 2021-10-03 NOTE — ED Notes (Addendum)
Patient ambulated to restroom. Upon returning back from room patient had increased chest pain. Hospitalist made aware. Patients O2 98% upon returning from ambulating to restroom on [address] [place].  ?

## 2021-10-03 NOTE — Assessment & Plan Note (Signed)
-   Continue Symbicort  

## 2021-10-03 NOTE — Assessment & Plan Note (Signed)
Patient reportedly had been snorting Roxicodone prior to her hospitalization at [facility] ?She denies any illicit drug use today ?She does exhibit behaviors consistent with pain med seeking ?Has been advised that she likely will not be discharged with pain medications, and that she will only get a few pain medication doses here to get control of her pain and then converted to lidocaine patch ?UDS pending ?Consider TOC consult ?

## 2021-10-03 NOTE — Progress Notes (Deleted)
? ?[name] [name], female    DOB: 07-15-1960   MRN: [id number] ? ? ?Brief patient profile:  ?61  yowf active smoker  referred to pulmonary clinic in [place]  07/10/2021 by Juanetta Gosling PA for copd with symptoms starting around 2012  prev eval by Dr Isaiah Serge with GOLD 0 critieria/ AB phenotype  but requested office closer to home  ? ? ? ? ?History of Present Illness  ?07/10/2021  Pulmonary/ 1st office eval/ Sherene Sires / [place] [facility]  on [address] 250 ?Chief Complaint  ?Patient presents with  ? New Patient (Initial Visit)  ?  Patient here for copd and emphysema. Ref by cardiologist. States she is using 4LO2 cont. At night all night and prn during day. Coughing up green/brown mucus.   ?Dyspnea:  [employer] shopping / no HC parking / some limited by neck from houswork ?Cough: every night at hs after lie down and also while asleep x one year / augmentin best also helps nasal congestion/ coughs so hard gets fainty ?Sleep: bed is flat with pillows x 40 degrees with overt hb / on 02  ?SABA use: twice daily hfa/ neb once every 2 weeks ?Rec ?Plan A = Automatic = Always=    Symbicort 80 Take 2 puffs first thing in am and then another 2 puffs about 12 hours later.  ? Work on inhaler technique:  ? Plan B = Backup (to supplement plan A, not to replace it) ?Only use your albuterol inhaler as a rescue medication ?Plan C = Crisis (instead of Plan B but only if Plan B stops working) ?- only use your albuterol nebulizer if you first try Plan B  ?For cough /congestion > mucinex dm 1200 mg twice daily = 2400 mg total per 24 hours ?Prednisone 10 mg take  4 each am x 2 days,   2 each am x 2 days,  1 each am x 2 days and stop  ?Augmentin 875 mg take one pill twice daily  X 10 days -  ?Pantoprazole (protonix) 40 mg   Take  30-60 min before first meal of the day and Pepcid (famotidine)  20 mg after supper until return to office  ?GERD diet reviewed, bed blocks rec  ?The key is to stop smoking completely before smoking completely stops  you! ?Please schedule a follow up office visit in 6 weeks, call sooner if needed with all medications /inhalers/ solutions in hand  ? ? ? ?10/03/2021  f/u ov/[place] office/[facility] re: GOLD 0/ AB maint on ***  ?No chief complaint on file. ?  ?Dyspnea:  *** ?Cough: *** ?Sleeping: *** ?SABA use: *** ?02: *** ?Covid status: *** ?Lung cancer screening: *** ? ? ?No obvious day to day or daytime variability or assoc excess/ purulent sputum or mucus plugs or hemoptysis or cp or chest tightness, subjective wheeze or overt sinus or hb symptoms.  ? ?*** without nocturnal  or early am exacerbation  of respiratory  c/o's or need for noct saba. Also denies any obvious fluctuation of symptoms with weather or environmental changes or other aggravating or alleviating factors except as outlined above  ? ?No unusual exposure hx or h/o childhood pna/ asthma or knowledge of premature birth. ? ?Current Allergies, Complete Past Medical History, Past Surgical History, Family History, and Social History were reviewed in [employer] record. ? ?ROS  The following are not active complaints unless bolded ?Hoarseness, sore throat, dysphagia, dental problems, itching, sneezing,  nasal congestion or discharge of excess mucus  or purulent secretions, ear ache,   fever, chills, sweats, unintended wt loss or wt gain, classically pleuritic or exertional cp,  orthopnea pnd or arm/hand swelling  or leg swelling, presyncope, palpitations, abdominal pain, anorexia, nausea, vomiting, diarrhea  or change in bowel habits or change in bladder habits, change in stools or change in urine, dysuria, hematuria,  rash, arthralgias, visual complaints, headache, numbness, weakness or ataxia or problems with walking or coordination,  change in mood or  memory. ?      ? ?No outpatient medications have been marked as taking for the 10/03/21 encounter (Appointment) with Nyoka Cowden, MD.  ?     ?  ?  ?   ? ?Past Medical History:  ?Diagnosis Date  ?  Acid reflux   ? CAD (coronary artery disease)   ? S/P cath with stent in 2001  ? CHF (congestive heart failure) (HCC)   ? Collagen vascular disease (HCC)   ? COPD (chronic obstructive pulmonary disease) (HCC)   ? CVA (cerebral vascular accident) (HCC) 1992  ? DU (duodenal ulcer) 09/19/2009  ? Hiatal hernia   ? EGD september 2007. normal except for small hiatal hernia. She underwent small bowel biopsy with history of diarrhea at that time. This was negative  ? History of colonoscopy   ? August 2007. She had a pedunculated polyp at 30 cm. which was hamartomatous polyp. She  is due for 5-year followup with family history of coln cancer in a brother at age 32  ? History of colonoscopy   ? 2005 revealed a linear ulcer with scar versus inflammatory process at the mid right colon, but normal terminal ileum. A biopsy of this area revealed an ulceration, but nonspecific  ? History of coronary angiogram   ? CT angiogram in 2007. negative with normal mesenteric arteries  ? History of kidney stones   ? Migraine   ? Pulmonary embolus (HCC) 1999  ? Ulcerative colitis (HCC)   ? Urinary frequency   ? ?  ? ? ? ?Objective:  ?  ? ?Wt Readings from Last 3 Encounters:  ?10/02/21 124 lb (56.2 kg)  ?08/15/21 123 lb (55.8 kg)  ?08/13/21 123 lb 3.2 oz (55.9 kg)  ?  ? ? ?Vital signs reviewed  10/03/2021  - Note at rest 02 sats  ***% on ***  ? ?General appearance:    ***  Min bar *** ?  ?  ?   ?Assessment  ? ?  ?  ? ?  ?  ?  ?  ?   ?

## 2021-10-03 NOTE — Assessment & Plan Note (Signed)
Patient diagnosed with pneumonia for 5 days ago at [facility] [place] ?Today CTA shows interval worsening of multifocal pneumonia with trace pleural effusion ?Patient reports that she had been taking antibiotics that she was discharged with from [facility] ?Started on Rocephin and Zithromax in the ED ?Continue Rocephin and Zithromax ?Expectorated sputum culture pending ?Legionella and strep antigens pending ?Blood cultures pending ?Continue to monitor ?

## 2021-10-03 NOTE — Assessment & Plan Note (Signed)
Nicotine patch in place ?Counseled on importance of cessation ?

## 2021-10-03 NOTE — Assessment & Plan Note (Signed)
2.89 ?CTA negative for PE ?

## 2021-10-03 NOTE — H&P (Signed)
?History and Physical  ? ? ?Patient: [name] [name]:[id number] DOB: 07-25-1960 ?DOA: 10/02/2021 ?DOS: the patient was seen and examined on 10/03/2021 ?PCP: Lianne Moris, PA-C  ?Patient coming from: Home ? ?Chief Complaint:  ?Chief Complaint  ?Patient presents with  ? Shortness of Breath  ? ?HPI: ALYZABETH [name] is a 61 y.o. female with medical history significant of history of acid reflux, coronary artery disease, CHF, COPD, CVA, pulmonary embolus, ulcerative colitis, migraine, and more presents ED with complaint of dyspnea.  Nephew is at bedside and barely left the patient answering questions.  Nephew is also very concerned about her pain and her pain meds.  The dynamic is hard to interpret.  It is reported the patient was diagnosed with pneumonia for 5 days ago at [facility].  She left AMA because she did not want a central line per nephew.  ED provider reported that patient had also been snorting some oxycodone before that admission.  She had to be rescued with Narcan.  For this reason there was some concern about aspiration.  She was discharged with some antibiotics reports she has been taking them for 4 days without any improvement.  His symptoms have actually been going on since before the [facility] admission, for approximately a total of 7 days.  She has pleuritic pain in the right side.  Its worse when she is coughing, better when she is at rest.  Her breathing is better since she has been in the ER, but she was feeling very short of breath.  Especially with exertion and sometimes with talking.  She uses nebulizers at home and she has been using them every day.  This nephew at bedside is not sure how often she has been using them per day.  She has had a productive cough as well.  Patient reports it is productive of white sputum.  She describes her pleuritic pain as sharp and intermittent.  She denies any fevers.  She had 1 episode of nausea and vomiting.  It looked like stomach fluid per her report.   She still remains nauseous.  Patient currently smokes a half a pack per day.  She has a nicotine patch on already.  Patient has no other complaints at this time. ? ?Patient does not drink alcohol, use illicit drugs per her report, and she is not vaccinated for COVID.  Patient is full code. ?Review of Systems: As mentioned in the history of present illness. All other systems reviewed and are negative. ?Past Medical History:  ?Diagnosis Date  ? Acid reflux   ? Arthritis   ? CAD (coronary artery disease)   ? S/P cath with stent in 2001  ? CHF (congestive heart failure) (HCC)   ? Collagen vascular disease (HCC)   ? COPD (chronic obstructive pulmonary disease) (HCC)   ? CVA (cerebral vascular accident) (HCC) 1992  ? DU (duodenal ulcer) 09/19/2009  ? Hiatal hernia   ? EGD september 2007. normal except for small hiatal hernia. She underwent small bowel biopsy with history of diarrhea at that time. This was negative  ? History of colonoscopy   ? August 2007. She had a pedunculated polyp at 30 cm. which was hamartomatous polyp. She  is due for 5-year followup with family history of coln cancer in a brother at age 67  ? History of colonoscopy   ? 2005 revealed a linear ulcer with scar versus inflammatory process at the mid right colon, but normal terminal ileum. A biopsy of this  area revealed an ulceration, but nonspecific  ? History of coronary angiogram   ? CT angiogram in 2007. negative with normal mesenteric arteries  ? History of kidney stones   ? Migraine   ? Pneumonia   ? pt states she gets pneumonia about twice a year  ? Pulmonary embolus (HCC) 1999  ? Ulcerative colitis (HCC)   ? Urinary frequency   ? ?Past Surgical History:  ?Procedure Laterality Date  ? ABDOMINAL HYSTERECTOMY  1985  ? ANTERIOR CERVICAL DECOMP/DISCECTOMY FUSION N/A 08/15/2021  ? Procedure: Cervical Three-Four, Cervical Four-Five, Cervical Five-Six  Anterior cervical decompression/discectomy/fusion/interbody prosthesis/plate/screws;  Surgeon:  Tressie StalkerJenkins, Jeffrey, MD;  [facility];  Service: Neurosurgery;  Laterality: N/A;  Cervical Three-Four, Cervical Four-Five, Cervical Five-Six  Anterior cervical decompression/discectomy/fusion/interbody prosthesis/plate/screws  ? APPENDECTOMY    ? CESAREAN SECTION  1979  ? CHOLECYSTECTOMY  2021  ? COLONOSCOPY  01/2006  ? Rourk-pedunculated polyp 30 cm hamartomatous polyp,  ? COLONOSCOPY  2005  ? Lineal ulcer or mid right colon, normal TI nonspecific biopsy  ? CORONARY ANGIOPLASTY WITH STENT PLACEMENT  2001  ? [facility], [place]  ? ESOPHAGOGASTRODUODENOSCOPY  09/19/2009  ? Rourk -2 duodenal bulbar ulcers, small HH otherwise normal/tiny distal esophageal erosions with mild erosive reflux  ? ESOPHAGOGASTRODUODENOSCOPY  03/2006  ? Rourk-Hiatal hernia, negative small bowel biopsy  ? LEFT HEART CATH AND CORONARY ANGIOGRAPHY N/A 04/18/2021  ? Procedure: LEFT HEART CATH AND CORONARY ANGIOGRAPHY;  Surgeon: Orbie Pyohukkani, Arun K, MD;  Location: [facility] INVASIVE CV LAB;  Service: Cardiovascular;  Laterality: N/A;  ? SPINE SURGERY    ? TONSILLECTOMY  1986  ? TUBAL LIGATION    ? Unilateral oophorectomy with hysterectomy    ? ?Social History:  reports that she has been smoking cigarettes. She has a 17.50 pack-year smoking history. She has never used smokeless tobacco. She reports that she does not drink alcohol and does not use drugs. ? ?Allergies  ?Allergen Reactions  ? Codeine   ? Hydrocodone Nausea And Vomiting  ? Sulfa Antibiotics Hives and Itching  ? Hydrocodone-Acetaminophen Nausea And Vomiting and Anxiety  ? ? ?Family History  ?Problem Relation Age of Onset  ? Colon cancer Brother [id number]  ?     died 1 year later  ? Heart attack Mother [id number]  ? Diabetes Sister   ? ? ?Prior to Admission medications   ?Medication Sig Start Date End Date Taking? Authorizing Provider  ?albuterol (PROVENTIL HFA;VENTOLIN HFA) 108 (90 BASE) MCG/ACT inhaler Inhale 2 puffs into the lungs every 6 (six) hours as needed for shortness of breath or wheezing.     [provider]  ?albuterol (PROVENTIL) (2.5 MG/3ML) 0.083% nebulizer solution Take 3 mLs (2.5 mg total) by nebulization every 4 (four) hours as needed for wheezing or shortness of breath. 07/10/21   Nyoka CowdenWert, Michael B, MD  ?atorvastatin (LIPITOR) 40 MG tablet Take 1 tablet (40 mg total) by mouth daily. 04/19/21 08/09/21  Lorin Glassahal, Binaya, MD  ?budesonide-formoterol (SYMBICORT) 80-4.5 MCG/ACT inhaler Take 2 puffs first thing in am and then another 2 puffs about 12 hours later. 07/10/21   Nyoka CowdenWert, Michael B, MD  ?cyclobenzaprine (FLEXERIL) 10 MG tablet Take 1 tablet (10 mg total) by mouth 3 (three) times daily as needed for muscle spasms. 08/16/21   Val EagleBergman, Meghan D, NP  ?docusate sodium (COLACE) 100 MG capsule Take 1 capsule (100 mg total) by mouth 2 (two) times daily. 08/16/21   Val EagleBergman, Meghan D, NP  ?famotidine (PEPCID) 20 MG tablet One after supper 07/10/21  Nyoka Cowden, MD  ?gabapentin (NEURONTIN) 300 MG capsule Take 300 mg by mouth 3 (three) times daily. 10/12/20   [provider]  ?guaiFENesin (MUCINEX) 600 MG 12 hr tablet Take 1 tablet (600 mg total) by mouth 2 (two) times daily. ?Patient taking differently: Take 600 mg by mouth 2 (two) times daily as needed for cough or to loosen phlegm. 04/11/14   Erick Blinks, MD  ?hydrOXYzine (ATARAX/VISTARIL) 50 MG tablet Take 50 mg by mouth 3 (three) times daily as needed for anxiety. 11/06/20   [provider]  ?ondansetron (ZOFRAN-ODT) 8 MG disintegrating tablet Take 8 mg by mouth every 6 (six) hours as needed for vomiting or nausea. 06/26/21   [provider]  ?OXYGEN Inhale 4 L into the lungs continuous as needed (shortness of breath).    [provider]  ?pantoprazole (PROTONIX) 40 MG tablet TAKE ONE TABLET BY MOUTH DAILY. TAKE 30-60 MINUTES BEFORE FIRST MEAL OF THE DAY. 09/28/21   Nyoka Cowden, MD  ?predniSONE (DELTASONE) 10 MG tablet Take  4 each am x 2 days,   2 each am x 2 days,  1 each am x 2 days and stop 07/10/21   Nyoka Cowden, MD  ?QUEtiapine (SEROQUEL) 100 MG tablet Take 100 mg by mouth at bedtime. 11/06/20   [provider]  ? ? ?Physical Exam: ?Vitals:  ? 10/02/21 2315 10/03/21 0000 10/03/21 0045 10/03/21 0130  ?BP

## 2021-10-03 NOTE — ED Notes (Addendum)
Family member came out to desk requesting that patient be unhooked from monitor and that the patient wants to leave. This nurse educated patient on the importance of staying in the hospital for treatment. Patient verbalized understanding of the risks of leaving AMA. Hospitalist made aware.  ?

## 2021-10-04 LAB — LEGIONELLA PNEUMOPHILA SEROGP 1 UR AG: L. pneumophila Serogp 1 Ur Ag: NEGATIVE

## 2021-10-07 LAB — CULTURE, BLOOD (ROUTINE X 2)
Culture: NO GROWTH
Culture: NO GROWTH
Special Requests: ADEQUATE
Special Requests: ADEQUATE

## 2021-11-09 ENCOUNTER — Ambulatory Visit: Payer: Medicaid Other | Admitting: Internal Medicine

## 2021-11-09 NOTE — Progress Notes (Deleted)
[name] [name], female    DOB: Nov 12, 1960   MRN: [id number]   Brief patient profile:  61  yowf active smoker  referred to pulmonary clinic in [place]  07/10/2021 by Maurene Capes PA for copd with symptoms starting around 2012  prev eval by Dr Vaughan Browner with GOLD 0 critieria/ AB phenotype  but requested office closer to home      History of Present Illness  07/10/2021  Pulmonary/ 1st office eval/ [name] / [name]  on advair 23 Chief Complaint  Patient presents with   New Patient (Initial Visit)    Patient here for copd and emphysema. Ref by cardiologist. States she is using 4LO2 cont. At night all night and prn during day. Coughing up green/brown mucus.   Dyspnea:  [employer] shopping / no HC parking / some limited by neck from houswork Cough: every night at hs after lie down and also while asleep x one year / augmentin best also helps nasal congestion/ coughs so hard gets fainty Sleep: bed is flat with pillows x 40 degrees with overt hb / on 02  SABA use: twice daily hfa/ neb once every 2 weeks Rec Plan A = Automatic = Always=    Symbicort 80 Take 2 puffs first thing in am and then another 2 puffs about 12 hours later.  Work on inhaler technique:   Plan B = Backup (to supplement plan A, not to replace it) Only use your albuterol inhaler as a rescue medication Plan C = Crisis (instead of Plan B but only if Plan B stops working) - only use your albuterol nebulizer if you first try Plan B and it fails to help  For cough /congestion > mucinex dm 1200 mg twice daily = 2400 mg total per 24 hours Prednisone 10 mg take  4 each am x 2 days,   2 each am x 2 days,  1 each am x 2 days and stop  Augmentin 875 mg take one pill twice daily  X 10 days  Pantoprazole (protonix) 40 mg   Take  30-60 min before first meal of the day and Pepcid (famotidine)  20 mg after supper until return to office GERD diet reviewed, bed blocks rec  Please schedule a follow up office visit in 6 weeks, call  sooner if needed with all medications /inhalers/ solutions in hand    11/09/2021  f/u ov/[place] office/Franceen Erisman re: COPD GOLD 0/ active smoker maint on ***  No chief complaint on file.   Dyspnea:  *** Cough: *** Sleeping: *** SABA use: *** 02: *** Covid status: *** Lung cancer screening: ***   No obvious day to day or daytime variability or assoc excess/ purulent sputum or mucus plugs or hemoptysis or cp or chest tightness, subjective wheeze or overt sinus or hb symptoms.   *** without nocturnal  or early am exacerbation  of respiratory  c/o's or need for noct saba. Also denies any obvious fluctuation of symptoms with weather or environmental changes or other aggravating or alleviating factors except as outlined above   No unusual exposure hx or h/o childhood pna/ asthma or knowledge of premature birth.  Current Allergies, Complete Past Medical History, Past Surgical History, Family History, and Social History were reviewed in [employer] record.  ROS  The following are not active complaints unless bolded Hoarseness, sore throat, dysphagia, dental problems, itching, sneezing,  nasal congestion or discharge of excess mucus or purulent secretions, ear ache,   fever, chills,  sweats, unintended wt loss or wt gain, classically pleuritic or exertional cp,  orthopnea pnd or arm/hand swelling  or leg swelling, presyncope, palpitations, abdominal pain, anorexia, nausea, vomiting, diarrhea  or change in bowel habits or change in bladder habits, change in stools or change in urine, dysuria, hematuria,  rash, arthralgias, visual complaints, headache, numbness, weakness or ataxia or problems with walking or coordination,  change in mood or  memory.        No outpatient medications have been marked as taking for the 11/09/21 encounter (Appointment) with Tanda Rockers, MD.              Past Medical History:  Diagnosis Date   Acid reflux    CAD (coronary artery disease)     S/P cath with stent in 2001   CHF (congestive heart failure) ([place])    Collagen vascular disease ([place])    COPD (chronic obstructive pulmonary disease) ([place])    CVA (cerebral vascular accident) ([place]) 1992   DU (duodenal ulcer) 09/19/2009   Hiatal hernia    EGD september 2007. normal except for small hiatal hernia. She underwent small bowel biopsy with history of diarrhea at that time. This was negative   History of colonoscopy    August 2007. She had a pedunculated polyp at 30 cm. which was hamartomatous polyp. She  is due for 5-year followup with family history of coln cancer in a brother at age 56   History of colonoscopy    2005 revealed a linear ulcer with scar versus inflammatory process at the mid right colon, but normal terminal ileum. A biopsy of this area revealed an ulceration, but nonspecific   History of coronary angiogram    CT angiogram in 2007. negative with normal mesenteric arteries   History of kidney stones    Migraine    Pulmonary embolus ([place]) 1999   Ulcerative colitis ([place])    Urinary frequency       Objective:       Wt Readings from Last 3 Encounters:  10/02/21 124 lb (56.2 kg)  08/15/21 123 lb (55.8 kg)  08/13/21 123 lb 3.2 oz (55.9 kg)      Vital signs reviewed  11/09/2021  - Note at rest 02 sats  ***% on ***   General appearance:    ***      Min bar ***    Assessment

## 2021-12-10 ENCOUNTER — Ambulatory Visit: Payer: Medicare Other | Admitting: Cardiology

## 2021-12-10 NOTE — Progress Notes (Incomplete)
Clinical Summary Ms. Brodowski is a 61 y.o.female seen today for follow up of the following medical problems.    1. CAD - from notes history of prior stent in 2001. Last cah 2010 with patient coronaries and stent.  - recent chest pain. Pulling like feeling/burning mid to right chest, 7-8/10. Can occur at rest or with exertion. Can get very diaphoretic. Not positional. Pain lasts a few minutes, 4 episodes over the last month - highest level of activity is housework, notes low energy keeps her from her regular activities - not on aspirin. STopped several meds due to lack of insurance.        03/2021 cath: patent vessels and LCX stent.  03/2021 echo: LVEF 65-70%, no WMAs, indet diastolic, mild MR - no recent symptoms.          2. History of CVA - no recent symptoms.      3. Hyperlipidemia 03/2021 TC 105 TG 99 HDL 45 LDL 40     4. COPD   5. Elevated bp - prior visits 100s/60s, today manual 140/90   Past Medical History:  Diagnosis Date   Acid reflux    Arthritis    CAD (coronary artery disease)    S/P cath with stent in 2001   CHF (congestive heart failure) (HCC)    Collagen vascular disease (HCC)    COPD (chronic obstructive pulmonary disease) (HCC)    CVA (cerebral vascular accident) (HCC) 1992   DU (duodenal ulcer) 09/19/2009   Hiatal hernia    EGD september 2007. normal except for small hiatal hernia. She underwent small bowel biopsy with history of diarrhea at that time. This was negative   History of colonoscopy    August 2007. She had a pedunculated polyp at 30 cm. which was hamartomatous polyp. She  is due for 5-year followup with family history of coln cancer in a brother at age 81   History of colonoscopy    2005 revealed a linear ulcer with scar versus inflammatory process at the mid right colon, but normal terminal ileum. A biopsy of this area revealed an ulceration, but nonspecific   History of coronary angiogram    CT angiogram in 2007. negative with  normal mesenteric arteries   History of kidney stones    Migraine    Pneumonia    pt states she gets pneumonia about twice a year   Pulmonary embolus (HCC) 1999   Ulcerative colitis (HCC)    Urinary frequency      Allergies  Allergen Reactions   Codeine    Hydrocodone Nausea And Vomiting   Sulfa Antibiotics Hives and Itching   Hydrocodone-Acetaminophen Nausea And Vomiting and Anxiety     Current Outpatient Medications  Medication Sig Dispense Refill   albuterol (PROVENTIL HFA;VENTOLIN HFA) 108 (90 BASE) MCG/ACT inhaler Inhale 2 puffs into the lungs every 6 (six) hours as needed for shortness of breath or wheezing.     albuterol (PROVENTIL) (2.5 MG/3ML) 0.083% nebulizer solution Take 3 mLs (2.5 mg total) by nebulization every 4 (four) hours as needed for wheezing or shortness of breath.     atorvastatin (LIPITOR) 40 MG tablet Take 1 tablet (40 mg total) by mouth daily. 30 tablet 2   budesonide-formoterol (SYMBICORT) 80-4.5 MCG/ACT inhaler Take 2 puffs first thing in am and then another 2 puffs about 12 hours later. 1 each 12   cyclobenzaprine (FLEXERIL) 10 MG tablet Take 1 tablet (10 mg total) by mouth 3 (three) times daily  as needed for muscle spasms. (Patient not taking: Reported on 10/03/2021) 30 tablet 0   docusate sodium (COLACE) 100 MG capsule Take 1 capsule (100 mg total) by mouth 2 (two) times daily. (Patient not taking: Reported on 10/03/2021) 10 capsule 0   famotidine (PEPCID) 20 MG tablet One after supper (Patient taking differently: Take 20 mg by mouth daily.) 30 tablet 11   gabapentin (NEURONTIN) 300 MG capsule Take 300 mg by mouth 3 (three) times daily.     guaiFENesin (MUCINEX) 600 MG 12 hr tablet Take 1 tablet (600 mg total) by mouth 2 (two) times daily. (Patient not taking: Reported on 10/03/2021) 12 tablet 0   hydrOXYzine (ATARAX/VISTARIL) 50 MG tablet Take 50 mg by mouth 3 (three) times daily as needed for anxiety.     ondansetron (ZOFRAN-ODT) 8 MG disintegrating tablet  Take 8 mg by mouth every 6 (six) hours as needed for vomiting or nausea.     oxyCODONE (OXY IR/ROXICODONE) 5 MG immediate release tablet Take 5 mg by mouth every 6 (six) hours as needed for moderate pain.     OXYGEN Inhale 4 L into the lungs continuous as needed (shortness of breath).     pantoprazole (PROTONIX) 40 MG tablet TAKE ONE TABLET BY MOUTH DAILY. TAKE 30-60 MINUTES BEFORE FIRST MEAL OF THE DAY. (Patient taking differently: Take 40 mg by mouth daily.) 30 tablet 2   predniSONE (DELTASONE) 10 MG tablet Take  4 each am x 2 days,   2 each am x 2 days,  1 each am x 2 days and stop (Patient not taking: Reported on 10/03/2021) 14 tablet 0   QUEtiapine (SEROQUEL) 100 MG tablet Take 100 mg by mouth at bedtime.     No current facility-administered medications for this visit.     Past Surgical History:  Procedure Laterality Date   ABDOMINAL HYSTERECTOMY  1985   ANTERIOR CERVICAL DECOMP/DISCECTOMY FUSION N/A 08/15/2021   Procedure: Cervical Three-Four, Cervical Four-Five, Cervical Five-Six  Anterior cervical decompression/discectomy/fusion/interbody prosthesis/plate/screws;  Surgeon: Tressie StalkerJenkins, Jeffrey, MD;  [facility];  Service: Neurosurgery;  Laterality: N/A;  Cervical Three-Four, Cervical Four-Five, Cervical Five-Six  Anterior cervical decompression/discectomy/fusion/interbody prosthesis/plate/screws   APPENDECTOMY     CESAREAN SECTION  1979   CHOLECYSTECTOMY  2021   COLONOSCOPY  01/2006   Rourk-pedunculated polyp 30 cm hamartomatous polyp,   COLONOSCOPY  2005   Lineal ulcer or mid right colon, normal TI nonspecific biopsy   CORONARY ANGIOPLASTY WITH STENT PLACEMENT  2001   [facility], [place]   ESOPHAGOGASTRODUODENOSCOPY  09/19/2009   Rourk -2 duodenal bulbar ulcers, small HH otherwise normal/tiny distal esophageal erosions with mild erosive reflux   ESOPHAGOGASTRODUODENOSCOPY  03/2006   Rourk-Hiatal hernia, negative small bowel biopsy   LEFT HEART CATH AND CORONARY ANGIOGRAPHY  N/A 04/18/2021   Procedure: LEFT HEART CATH AND CORONARY ANGIOGRAPHY;  Surgeon: Orbie Pyohukkani, Arun K, MD;  Location: [facility] IN[facility];  Service: Cardiovascular;  Laterality: N/A;   SPINE SURGERY     TONSILLECTOMY  1986   TUBAL LIGATION     Unilateral oophorectomy with hysterectomy       Allergies  Allergen Reactions   Codeine    Hydrocodone Nausea And Vomiting   Sulfa Antibiotics Hives and Itching   Hydrocodone-Acetaminophen Nausea And Vomiting and Anxiety      Family History  Problem Relation Age of Onset   Colon cancer Brother [id number]       died 1 year later   Heart attack Mother [id number]   Diabetes  Sister      Social History Ms. Auer reports that she has been smoking cigarettes. She has a 17.50 pack-year smoking history. She has never used smokeless tobacco. Ms. Guzzetta reports no history of alcohol use.   Review of Systems CONSTITUTIONAL: No weight loss, fever, chills, weakness or fatigue.  HEENT: Eyes: No visual loss, blurred vision, double vision or yellow sclerae.No hearing loss, sneezing, congestion, runny nose or sore throat.  SKIN: No rash or itching.  CARDIOVASCULAR:  RESPIRATORY: No shortness of breath, cough or sputum.  GASTROINTESTINAL: No anorexia, nausea, vomiting or diarrhea. No abdominal pain or blood.  GENITOURINARY: No burning on urination, no polyuria NEUROLOGICAL: No headache, dizziness, syncope, paralysis, ataxia, numbness or tingling in the extremities. No change in bowel or bladder control.  MUSCULOSKELETAL: No muscle, back pain, joint pain or stiffness.  LYMPHATICS: No enlarged nodes. No history of splenectomy.  PSYCHIATRIC: No history of depression or anxiety.  ENDOCRINOLOGIC: No reports of sweating, cold or heat intolerance. No polyuria or polydipsia.  Marland Kitchen   Physical Examination There were no vitals filed for this visit. There were no vitals filed for this visit.  Gen: resting comfortably, no acute distress HEENT: no scleral icterus, pupils equal round  and reactive, no palptable cervical adenopathy,  CV Resp: Clear to auscultation bilaterally GI: abdomen is soft, non-tender, non-distended, normal bowel sounds, no hepatosplenomegaly MSK: extremities are warm, no edema.  Skin: warm, no rash Neuro:  no focal deficits Psych: appropriate affect   Diagnostic Studies  05/2000 cath RESULTS:   Hemodynamics:  Left ventricular pressure 126/20, aortic pressure 118/74. There is no aortic valve gradient.   Left ventriculogram:  There is moderate hypokinesis of the inferior wall and moderate hypokinesis of the posterolateral wall.  Ejection fraction calculated at 59%.  There is 2+ mild mitral regurgitation.   Coronary arteriography codominant): 1. The left main is normal. 2. The left anterior descending artery has a 30% stenosis in the mid vessel.    The LAD gives rise to a small first diagonal, normal second diagonal, and a    small third diagonal. 3. The left circumflex is a relatively large codominant vessel.  It gives    rise to a large first marginal, a large second marginal, and a small    posterolateral Victorine Mcnee.  There is a stent in the mid to distal circumflex    which extends into the second marginal Ameet Sandy.  There is a diffuse 20%    stenosis within the stent. 4. The right coronary artery is a relatively small codominant vessel.  There    is a 20% stenosis in the mid vessel.  The distal right coronary artery    gives rise to a normal-sized acute marginal which supplies a portion of    the inferior septum and a small posterior descending artery.   IMPRESSIONS: 1. Left ventricular systolic function in lower range of normal with wall    motion abnormalities as described and mild mitral regurgitation. 2. Mild nonobstructive coronary artery disease with a patent stent in the    left circumflex.   08/2008 cath ANGIOGRAPHIC FINDINGS:  1. Left main coronary artery bifurcated into the circumflex and LAD      and had no significant  disease noted.  2. The left anterior descending is a large vessel that courses to the      apex and gives rise to a first small diagonal Jaquann Guarisco, a normal      second diagonal Riley Hallum, and a small third  diagonal Senay Sistrunk.  There      was a 20% stenosis noted in the midportion of the LAD.  3. The left circumflex is a relatively large codominant vessel that      gives rise to a large first marginal Pau Banh, a large second      marginal Ambriana Selway, and a small posterolateral Amiri Tritch.  There is a      stent in the mid to distal circumflex which extends into the second      marginal Nyeshia Mysliwiec.  There is no significant in-stent stenosis noted.      There is a 30% stenosis noted prior to the stented segment in the      midportion of the circumflex.  4. The right coronary artery is a small to moderate size codominant      vessel that has serial 30% lesions in the midportion and gives rise      to a small posterior descending artery.  5. Left ventricular angiogram was performed in the RAO projection and      showed normal left ventricular systolic function with no wall      motion abnormalities.  Ejection fraction was estimated at 55%.    HEMODYNAMIC DATA:  Central aortic pressure 84/50.  Left ventricular  pressure 87/5.  End-diastolic pressure 11.    IMPRESSION:  1. Noncardiac etiology of chest pain.  2. Stable single-vessel coronary artery disease with patent stented      segment in the mid to distal circumflex coronary artery.  3. Mild nonobstructive disease in the right coronary artery and the      left anterior descending coronary artery.  4. Normal left ventricular systolic function.     03/2021 cath  Prox RCA lesion is 20% stenosed.   Mid Cx lesion is 10% stenosed.   LV end diastolic pressure is normal.   1.  Widely patent mid left circumflex stent with mild disease elsewhere. 2.  Normal LV function with LVEDP of 17 mmHg.     03/2021 echo IMPRESSIONS     1. Left ventricular ejection  fraction, by estimation, is 65 to 70%. The  left ventricle has normal function. The left ventricle has no regional  wall motion abnormalities. Left ventricular diastolic parameters are  indeterminate.   2. Right ventricular systolic function is normal. The right ventricular  size is normal. There is normal pulmonary artery systolic pressure. The  estimated right ventricular systolic pressure is 34.0 mmHg.   3. The mitral valve is grossly normal. Mild mitral valve regurgitation.   4. The aortic valve is tricuspid. Aortic valve regurgitation is not  visualized.   5. The inferior vena cava is normal in size with <50% respiratory  variability, suggesting right atrial pressure of 8 mmHg.    Assessment and Plan  1. CAD - recent cath with patents vessels and stent - no recent symptoms, continue current meds     2. Elevated bp - isoalted elevated bp today, manutal recheck 140/90. At ER visit just last week she was 100s/60s, last cards visit 98/60 - monitor at this time, there is not an incidation for meds currently   3. Hyperlipidemia - at goal, continue atorvastatin.       Antoine Poche, M.D

## 2021-12-24 DIAGNOSIS — F1120 Opioid dependence, uncomplicated: Secondary | ICD-10-CM | POA: Insufficient documentation

## 2022-01-16 ENCOUNTER — Encounter: Payer: Self-pay | Admitting: Neurology

## 2022-01-16 ENCOUNTER — Other Ambulatory Visit: Payer: Self-pay

## 2022-01-16 DIAGNOSIS — R202 Paresthesia of skin: Secondary | ICD-10-CM

## 2022-02-11 ENCOUNTER — Other Ambulatory Visit: Payer: Self-pay

## 2022-02-11 ENCOUNTER — Encounter (HOSPITAL_COMMUNITY): Payer: Self-pay | Admitting: Emergency Medicine

## 2022-02-11 ENCOUNTER — Emergency Department (HOSPITAL_COMMUNITY)
Admission: EM | Admit: 2022-02-11 | Discharge: 2022-02-11 | Disposition: A | Discharge status: Home / Self Care | Payer: Medicare Other | Attending: Emergency Medicine | Admitting: Emergency Medicine

## 2022-02-11 DIAGNOSIS — R109 Unspecified abdominal pain: Secondary | ICD-10-CM | POA: Insufficient documentation

## 2022-02-11 DIAGNOSIS — R112 Nausea with vomiting, unspecified: Secondary | ICD-10-CM | POA: Insufficient documentation

## 2022-02-11 DIAGNOSIS — Z5321 Procedure and treatment not carried out due to patient leaving prior to being seen by health care provider: Secondary | ICD-10-CM | POA: Insufficient documentation

## 2022-02-11 LAB — LIPASE, BLOOD: Lipase: 25 U/L (ref 11–51)

## 2022-02-11 LAB — CBC
HCT: 37 % (ref 36.0–46.0)
Hemoglobin: 12.5 g/dL (ref 12.0–15.0)
MCH: 28.9 pg (ref 26.0–34.0)
MCHC: 33.8 g/dL (ref 30.0–36.0)
MCV: 85.5 fL (ref 80.0–100.0)
Platelets: 301 10*3/uL (ref 150–400)
RBC: 4.33 MIL/uL (ref 3.87–5.11)
RDW: 13.8 % (ref 11.5–15.5)
WBC: 9.4 10*3/uL (ref 4.0–10.5)
nRBC: 0 % (ref 0.0–0.2)

## 2022-02-11 LAB — COMPREHENSIVE METABOLIC PANEL
ALT: 10 U/L (ref 0–44)
AST: 14 U/L — ABNORMAL LOW (ref 15–41)
Albumin: 3.9 g/dL (ref 3.5–5.0)
Alkaline Phosphatase: 86 U/L (ref 38–126)
Anion gap: 8 (ref 5–15)
BUN: 14 mg/dL (ref 8–23)
CO2: 25 mmol/L (ref 22–32)
Calcium: 9 mg/dL (ref 8.9–10.3)
Chloride: 107 mmol/L (ref 98–111)
Creatinine, Ser: 0.79 mg/dL (ref 0.44–1.00)
GFR, Estimated: >60 mL/min (ref >60)
Glucose, Bld: 100 mg/dL — ABNORMAL HIGH (ref 70–99)
Potassium: 3.9 mmol/L (ref 3.5–5.1)
Sodium: 140 mmol/L (ref 135–145)
Total Bilirubin: 0.3 mg/dL (ref 0.3–1.2)
Total Protein: 6.8 g/dL (ref 6.5–8.1)

## 2022-02-11 NOTE — ED Triage Notes (Signed)
Pt presents with abdominal pain, nausea and vomiting, with migraine since last Thursday, PMH includes ulcerative colitis.

## 2022-02-11 NOTE — ED Notes (Addendum)
Pt walked over to registration and asked how long it was the wait, pt stated "I feel like I'm going to fall" pt walked over to vertical area and purposely fell on floor x2, pt assessed no injuries noted, pt stated "I'm leaving and going to [facility]".  Pt wheeled out of ED via wheelchair with husband to exit.

## 2022-02-14 ENCOUNTER — Emergency Department (HOSPITAL_COMMUNITY)
Admission: EM | Admit: 2022-02-14 | Discharge: 2022-02-14 | Discharge status: Against Medical Advice | Payer: Medicare Other | Attending: Emergency Medicine | Admitting: Emergency Medicine

## 2022-02-14 ENCOUNTER — Encounter (HOSPITAL_COMMUNITY): Payer: Self-pay | Admitting: Emergency Medicine

## 2022-02-14 ENCOUNTER — Other Ambulatory Visit: Payer: Self-pay

## 2022-02-14 DIAGNOSIS — R109 Unspecified abdominal pain: Secondary | ICD-10-CM | POA: Diagnosis not present

## 2022-02-14 DIAGNOSIS — R112 Nausea with vomiting, unspecified: Secondary | ICD-10-CM | POA: Insufficient documentation

## 2022-02-14 DIAGNOSIS — R519 Headache, unspecified: Secondary | ICD-10-CM | POA: Diagnosis not present

## 2022-02-14 LAB — CBC WITH DIFFERENTIAL/PLATELET
Abs Immature Granulocytes: 0.01 10*3/uL (ref 0.00–0.07)
Basophils Absolute: 0.1 10*3/uL (ref 0.0–0.1)
Basophils Relative: 1 %
Eosinophils Absolute: 0.2 10*3/uL (ref 0.0–0.5)
Eosinophils Relative: 3 %
HCT: 33.8 % — ABNORMAL LOW (ref 36.0–46.0)
Hemoglobin: 11.4 g/dL — ABNORMAL LOW (ref 12.0–15.0)
Immature Granulocytes: 0 %
Lymphocytes Relative: 21 %
Lymphs Abs: 1.4 10*3/uL (ref 0.7–4.0)
MCH: 28.8 pg (ref 26.0–34.0)
MCHC: 33.7 g/dL (ref 30.0–36.0)
MCV: 85.4 fL (ref 80.0–100.0)
Monocytes Absolute: 0.6 10*3/uL (ref 0.1–1.0)
Monocytes Relative: 9 %
Neutro Abs: 4.4 10*3/uL (ref 1.7–7.7)
Neutrophils Relative %: 66 %
Platelets: 293 10*3/uL (ref 150–400)
RBC: 3.96 MIL/uL (ref 3.87–5.11)
RDW: 13.8 % (ref 11.5–15.5)
WBC: 6.7 10*3/uL (ref 4.0–10.5)
nRBC: 0 % (ref 0.0–0.2)

## 2022-02-14 LAB — COMPREHENSIVE METABOLIC PANEL
ALT: 13 U/L (ref 0–44)
AST: 20 U/L (ref 15–41)
Albumin: 4 g/dL (ref 3.5–5.0)
Alkaline Phosphatase: 72 U/L (ref 38–126)
Anion gap: 9 (ref 5–15)
BUN: 9 mg/dL (ref 8–23)
CO2: 24 mmol/L (ref 22–32)
Calcium: 8.7 mg/dL — ABNORMAL LOW (ref 8.9–10.3)
Chloride: 107 mmol/L (ref 98–111)
Creatinine, Ser: 0.71 mg/dL (ref 0.44–1.00)
GFR, Estimated: >60 mL/min (ref >60)
Glucose, Bld: 95 mg/dL (ref 70–99)
Potassium: 3.5 mmol/L (ref 3.5–5.1)
Sodium: 140 mmol/L (ref 135–145)
Total Bilirubin: 0.7 mg/dL (ref 0.3–1.2)
Total Protein: 6.8 g/dL (ref 6.5–8.1)

## 2022-02-14 LAB — LIPASE, BLOOD: Lipase: 26 U/L (ref 11–51)

## 2022-02-14 MED ORDER — ONDANSETRON HCL 4 MG/2ML IJ SOLN
4.0000 mg | Freq: Once | INTRAMUSCULAR | Status: AC
  Administered 2022-02-14: 4 mg via INTRAVENOUS
  Filled 2022-02-14: qty 2

## 2022-02-14 MED ORDER — METOCLOPRAMIDE HCL 5 MG/ML IJ SOLN
10.0000 mg | Freq: Once | INTRAMUSCULAR | Status: AC
  Administered 2022-02-14: 10 mg via INTRAVENOUS
  Filled 2022-02-14: qty 2

## 2022-02-14 MED ORDER — SODIUM CHLORIDE 0.9 % IV BOLUS
1000.0000 mL | Freq: Once | INTRAVENOUS | Status: AC
  Administered 2022-02-14: 1000 mL via INTRAVENOUS

## 2022-02-14 MED ORDER — KETOROLAC TROMETHAMINE 30 MG/ML IJ SOLN
15.0000 mg | Freq: Once | INTRAMUSCULAR | Status: AC
  Administered 2022-02-14: 15 mg via INTRAVENOUS
  Filled 2022-02-14: qty 1

## 2022-02-14 NOTE — ED Provider Notes (Signed)
[facility] EMERGENCY DEPARTMENT Provider Note   CSN: [id number] Arrival date & time: 02/14/22  0042     History  Chief Complaint  Patient presents with   Emesis    [name] [name] is a 61 y.o. female.  Presents to the emergency department with multiple complaints.  Patient reports that she has had nausea and vomiting for 8 days.  She reports that she has not been able to hold anything down.  She is experiencing a headache that is similar to migraine she has had in the past.  This has been ongoing for days as well.  No unusual or new neurologic symptoms compared to prior headaches.       Home Medications Prior to Admission medications   Medication Sig Start Date End Date Taking? Authorizing Provider  albuterol (PROVENTIL HFA;VENTOLIN HFA) 108 (90 BASE) MCG/ACT inhaler Inhale 2 puffs into the lungs every 6 (six) hours as needed for shortness of breath or wheezing.    [provider]  albuterol (PROVENTIL) (2.5 MG/3ML) 0.083% nebulizer solution Take 3 mLs (2.5 mg total) by nebulization every 4 (four) hours as needed for wheezing or shortness of breath. 07/10/21   Nyoka Cowden, MD  atorvastatin (LIPITOR) 40 MG tablet Take 1 tablet (40 mg total) by mouth daily. 04/19/21 10/03/21  Lorin Glass, MD  budesonide-formoterol (SYMBICORT) 80-4.5 MCG/ACT inhaler Take 2 puffs first thing in am and then another 2 puffs about 12 hours later. 07/10/21   Nyoka Cowden, MD  cyclobenzaprine (FLEXERIL) 10 MG tablet Take 1 tablet (10 mg total) by mouth 3 (three) times daily as needed for muscle spasms. Patient not taking: Reported on 10/03/2021 08/16/21   Val Eagle D, NP  docusate sodium (COLACE) 100 MG capsule Take 1 capsule (100 mg total) by mouth 2 (two) times daily. Patient not taking: Reported on 10/03/2021 08/16/21   Val Eagle D, NP  famotidine (PEPCID) 20 MG tablet One after supper Patient taking differently: Take 20 mg by mouth daily. 07/10/21   Nyoka Cowden, MD  gabapentin  (NEURONTIN) 300 MG capsule Take 300 mg by mouth 3 (three) times daily. 10/12/20   [provider]  guaiFENesin (MUCINEX) 600 MG 12 hr tablet Take 1 tablet (600 mg total) by mouth 2 (two) times daily. Patient not taking: Reported on 10/03/2021 04/11/14   Erick Blinks, MD  hydrOXYzine (ATARAX/VISTARIL) 50 MG tablet Take 50 mg by mouth 3 (three) times daily as needed for anxiety. 11/06/20   [provider]  ondansetron (ZOFRAN-ODT) 8 MG disintegrating tablet Take 8 mg by mouth every 6 (six) hours as needed for vomiting or nausea. 06/26/21   [provider]  oxyCODONE (OXY IR/ROXICODONE) 5 MG immediate release tablet Take 5 mg by mouth every 6 (six) hours as needed for moderate pain. 09/22/21   [provider]  OXYGEN Inhale 4 L into the lungs continuous as needed (shortness of breath).    [provider]  pantoprazole (PROTONIX) 40 MG tablet TAKE ONE TABLET BY MOUTH DAILY. TAKE 30-60 MINUTES BEFORE FIRST MEAL OF THE DAY. Patient taking differently: Take 40 mg by mouth daily. 09/28/21   Nyoka Cowden, MD  predniSONE (DELTASONE) 10 MG tablet Take  4 each am x 2 days,   2 each am x 2 days,  1 each am x 2 days and stop Patient not taking: Reported on 10/03/2021 07/10/21   Nyoka Cowden, MD  QUEtiapine (SEROQUEL) 100 MG tablet Take 100 mg by mouth at bedtime.  11/06/20   [provider]      Allergies    Codeine, Hydrocodone, Sulfa antibiotics, and Hydrocodone-acetaminophen    Review of Systems   Review of Systems  Physical Exam Updated Vital Signs BP 134/80   Pulse 77   Temp 98.6 F (37 C) (Oral)   Resp 14   Ht 5\' 4"  (1.626 m)   Wt 65.8 kg   SpO2 100%   BMI 24.89 kg/m  Physical Exam Vitals and nursing note reviewed.  Constitutional:      General: She is not in acute distress.    Appearance: She is well-developed.  HENT:     Head: Normocephalic and atraumatic.     Mouth/Throat:     Mouth: Mucous membranes are moist.  Eyes:     General:  Vision grossly intact. Gaze aligned appropriately.     Extraocular Movements: Extraocular movements intact.     Conjunctiva/sclera: Conjunctivae normal.  Cardiovascular:     Rate and Rhythm: Normal rate and regular rhythm.     Pulses: Normal pulses.     Heart sounds: Normal heart sounds, S1 normal and S2 normal. No murmur heard.    No friction rub. No gallop.  Pulmonary:     Effort: Pulmonary effort is normal. No respiratory distress.     Breath sounds: Normal breath sounds.  Abdominal:     General: Bowel sounds are normal.     Palpations: Abdomen is soft.     Tenderness: There is no abdominal tenderness. There is no guarding or rebound.     Hernia: No hernia is present.  Musculoskeletal:        General: No swelling.     Cervical back: Full passive range of motion without pain, normal range of motion and neck supple. No spinous process tenderness or muscular tenderness. Normal range of motion.     Right lower leg: No edema.     Left lower leg: No edema.  Skin:    General: Skin is warm and dry.     Capillary Refill: Capillary refill takes less than 2 seconds.     Findings: No ecchymosis, erythema, rash or wound.  Neurological:     General: No focal deficit present.     Mental Status: She is alert and oriented to person, place, and time.     GCS: GCS eye subscore is 4. GCS verbal subscore is 5. GCS motor subscore is 6.     Cranial Nerves: Cranial nerves 2-12 are intact.     Sensory: Sensation is intact.     Motor: Motor function is intact.     Coordination: Coordination is intact.  Psychiatric:        Attention and Perception: Attention normal.        Mood and Affect: Mood normal.        Speech: Speech normal.        Behavior: Behavior normal.     ED Results / Procedures / Treatments   Labs (all labs ordered are listed, but only abnormal results are displayed) Labs Reviewed  CBC WITH DIFFERENTIAL/PLATELET - Abnormal; Notable for the following components:      Result Value    Hemoglobin 11.4 (*)    HCT 33.8 (*)    All other components within normal limits  COMPREHENSIVE METABOLIC PANEL - Abnormal; Notable for the following components:   Calcium 8.7 (*)    All other components within normal limits  LIPASE, BLOOD  URINALYSIS, ROUTINE W REFLEX MICROSCOPIC  EKG EKG Interpretation  Date/Time:  Thursday February 14 2022 02:45:32 EDT Ventricular Rate:  70 PR Interval:  183 QRS Duration: 107 QT Interval:  442 QTC Calculation: 477 R Axis:   93 Text Interpretation: Sinus rhythm Right axis deviation Nonspecific T abnormalities, lateral leads Baseline wander in lead(s) I III aVL V1 Confirmed by Gilda Crease [phone]) on 02/14/2022 2:56:11 AM  Radiology No results found.  Procedures Procedures    Medications Ordered in ED Medications  sodium chloride 0.9 % bolus 1,000 mL (0 mLs Intravenous Stopped 02/14/22 0358)  ondansetron (ZOFRAN) injection 4 mg (4 mg Intravenous Given 02/14/22 0249)  metoCLOPramide (REGLAN) injection 10 mg (10 mg Intravenous Given 02/14/22 0251)  ketorolac (TORADOL) 30 MG/ML injection 15 mg (15 mg Intravenous Given 02/14/22 0251)    ED Course/ Medical Decision Making/ A&P                           Medical Decision Making Amount and/or Complexity of Data Reviewed Labs: ordered.  Risk Prescription drug management.   Patient with 8 days of abdominal pain, nausea, vomiting with headache.  She has a normal neurologic exam.  Nonfocal abdominal exam.  Headache is reportedly similar to her previous migraines, no unusual features.  Treated with migraine cocktail.  Work-up initiated for the abdominal pain.  Patient eloped from the department before work-up was complete.        Final Clinical Impression(s) / ED Diagnoses Final diagnoses:  Abdominal pain, unspecified abdominal location    Rx / DC Orders ED Discharge Orders     None         Sicilia Killough, Canary Brim, MD 02/14/22 [phone]

## 2022-02-14 NOTE — ED Triage Notes (Signed)
Pt c/o emesis x8 days, migraine, and abdominal pain. Emesis is yellow in color. Pt states she has fallen due to generalized weakness.

## 2022-02-15 ENCOUNTER — Other Ambulatory Visit: Payer: Self-pay | Admitting: Internal Medicine

## 2022-02-15 DIAGNOSIS — J449 Chronic obstructive pulmonary disease, unspecified: Secondary | ICD-10-CM

## 2022-02-25 ENCOUNTER — Encounter (INDEPENDENT_AMBULATORY_CARE_PROVIDER_SITE_OTHER): Payer: Self-pay | Admitting: *Deleted

## 2022-04-04 ENCOUNTER — Encounter: Payer: Medicare Other | Admitting: Neurology

## 2022-05-07 ENCOUNTER — Encounter: Payer: Medicare Other | Admitting: Neurology

## 2022-05-07 ENCOUNTER — Encounter: Payer: Self-pay | Admitting: Neurology

## 2022-05-07 DIAGNOSIS — Z029 Encounter for administrative examinations, unspecified: Secondary | ICD-10-CM

## 2022-05-13 ENCOUNTER — Ambulatory Visit (INDEPENDENT_AMBULATORY_CARE_PROVIDER_SITE_OTHER): Payer: Medicare Other | Admitting: Gastroenterology

## 2022-05-13 ENCOUNTER — Encounter (INDEPENDENT_AMBULATORY_CARE_PROVIDER_SITE_OTHER): Payer: Self-pay | Admitting: *Deleted

## 2022-05-13 ENCOUNTER — Encounter (INDEPENDENT_AMBULATORY_CARE_PROVIDER_SITE_OTHER): Payer: Self-pay | Admitting: Gastroenterology

## 2022-05-13 VITALS — BP 124/86 | HR 87 | Temp 98.7°F | Ht 64.0 in | Wt 118.7 lb

## 2022-05-13 DIAGNOSIS — R112 Nausea with vomiting, unspecified: Secondary | ICD-10-CM | POA: Diagnosis not present

## 2022-05-13 DIAGNOSIS — K219 Gastro-esophageal reflux disease without esophagitis: Secondary | ICD-10-CM | POA: Diagnosis not present

## 2022-05-13 DIAGNOSIS — R634 Abnormal weight loss: Secondary | ICD-10-CM

## 2022-05-13 NOTE — Patient Instructions (Addendum)
Schedule EGD and colonoscopy Perform blood workup in early AM Continue pantoprazole 40 mg twice a day Continue Zofran as needed for nausea Stop using high dose aspirin including Goody/BC powders, NSAIDs such as Aleve, ibuprofen, naproxen, Motrin, Voltaren or Advil (even the topical ones) If negative workup, may discuss CT abdomen and pelvis in next appointment

## 2022-05-13 NOTE — Progress Notes (Unsigned)
[name] [name], M.D. Gastroenterology & Hepatology [facility] Gastroenterology [address] [place], [place] [address] Primary Care Physician: [name] Moris, PA-C [address] [place] [address]  Referring MD: PCP  Chief Complaint: Nausea and vomiting  History of Present Illness: [name] [name] is a 61 y.o. female with PMH GERD, coronary artery disease, heart failure, COPD, CVA, pulmonary embolism, hiatal hernia, collagen vascular disease, unclear history of ulcerative colitis, who presents for evaluation of nausea and vomiting.  States that for the last 16 years she has presented intermittent episodes of nausea and multiple episodes of vomiting. She reports that when these episodes happen she tries to stay hydrated with Pedialyte so she voiced going to the ER. She used to have these episodes every couple of months but now she is having this every 3-4 weeks, which lasts for 3-5 days. She reports that the symptoms usually start overnight. She reports that she takes Protonix BID and Zofran when she has episodes of vomiting, although recently she has increased to daily intake. She may also take some Zantac and dramamine, but is frustrated as she feels that the symptoms are getting worse and not presenting any significant improvement. She reports that she has lost a significant amount of weight due to this but does not know how much she has lost.She also reports that after vomiting she may have significant abdominal pain in her lower abdomen but not in the rest of her abdomen.. She also mentions having some possible melena for the last 7-8 months intermittently. No rectal bleeding. The patient denies having any fever, chills, hematochezia,  hematemesis, abdominal distention, diarrhea, jaundice, pruritus.  Notably, the patient has been seen at [facility] and [facility] for opiate overdose and long term opiate use. She reports she has not taken any opiates since July.She has recently  been taking Aleve for chest pain, but does not take that chronically.  Last EGD: 2011 - normal per report Last Colonoscopy:  many years ago, no report available  FHx: neg for any gastrointestinal/liver disease, brother colon cancer when he was 17 years old, mother breast cancer Social: smokes 1/2 - 1/4 pack a day, neg alcohol or illicit drug use Surgical: c-section, hysterectomy, cholecystectomy  Past Medical History: Past Medical History:  Diagnosis Date   Acid reflux    Arthritis    CAD (coronary artery disease)    S/P cath with stent in 2001   CHF (congestive heart failure) (HCC)    Collagen vascular disease (HCC)    COPD (chronic obstructive pulmonary disease) (HCC)    CVA (cerebral vascular accident) (HCC) 1992   DU (duodenal ulcer) 09/19/2009   Hiatal hernia    EGD september 2007. normal except for small hiatal hernia. She underwent small bowel biopsy with history of diarrhea at that time. This was negative   History of colonoscopy    August 2007. She had a pedunculated polyp at 30 cm. which was hamartomatous polyp. She  is due for 5-year followup with family history of coln cancer in a brother at age 67   History of colonoscopy    2005 revealed a linear ulcer with scar versus inflammatory process at the mid right colon, but normal terminal ileum. A biopsy of this area revealed an ulceration, but nonspecific   History of coronary angiogram    CT angiogram in 2007. negative with normal mesenteric arteries   History of kidney stones    Migraine    Pneumonia    pt states  she gets pneumonia about twice a year   Pulmonary embolus (HCC) 1999   Ulcerative colitis (HCC)    Urinary frequency     Past Surgical History: Past Surgical History:  Procedure Laterality Date   ABDOMINAL HYSTERECTOMY  1985   ANTERIOR CERVICAL DECOMP/DISCECTOMY FUSION N/A 08/15/2021   Procedure: Cervical Three-Four, Cervical Four-Five, Cervical Five-Six  Anterior cervical  decompression/discectomy/fusion/interbody prosthesis/plate/screws;  Surgeon: Tressie Stalker, MD;  Location: [facility] OR;  Service: Neurosurgery;  Laterality: N/A;  Cervical Three-Four, Cervical Four-Five, Cervical Five-Six  Anterior cervical decompression/discectomy/fusion/interbody prosthesis/plate/screws   APPENDECTOMY     CESAREAN SECTION  1979   CHOLECYSTECTOMY  2021   COLONOSCOPY  01/2006   Rourk-pedunculated polyp 30 cm hamartomatous polyp,   COLONOSCOPY  2005   Lineal ulcer or mid right colon, normal TI nonspecific biopsy   CORONARY ANGIOPLASTY WITH STENT PLACEMENT  2001   [facility], [place]   ESOPHAGOGASTRODUODENOSCOPY  09/19/2009   Rourk -2 duodenal bulbar ulcers, small HH otherwise normal/tiny distal esophageal erosions with mild erosive reflux   ESOPHAGOGASTRODUODENOSCOPY  03/2006   Rourk-Hiatal hernia, negative small bowel biopsy   LEFT HEART CATH AND CORONARY ANGIOGRAPHY N/A 04/18/2021   Procedure: LEFT HEART CATH AND CORONARY ANGIOGRAPHY;  Surgeon: Orbie Pyo, MD;  Location: [facility] IN[facility];  Service: Cardiovascular;  Laterality: N/A;   SPINE SURGERY     TONSILLECTOMY  1986   TUBAL LIGATION     Unilateral oophorectomy with hysterectomy      Family History: Family History  Problem Relation Age of Onset   Colon cancer Brother 10       died 1 year later   Heart attack Mother 69   Diabetes Sister     Social History: Social History   Tobacco Use  Smoking Status Every Day   Packs/day: 0.50   Years: 35.00   Total pack years: 17.50   Types: Cigarettes   Passive exposure: Current  Smokeless Tobacco Never  Tobacco Comments   1/3 of a pack 07/28/19   Social History   Substance and Sexual Activity  Alcohol Use No   Social History   Substance and Sexual Activity  Drug Use No    Allergies: Allergies  Allergen Reactions   Codeine    Hydrocodone Nausea And Vomiting   Sulfa Antibiotics Hives and Itching   Hydrocodone-Acetaminophen Nausea And  Vomiting and Anxiety    Medications: Current Outpatient Medications  Medication Sig Dispense Refill   albuterol (PROVENTIL HFA;VENTOLIN HFA) 108 (90 BASE) MCG/ACT inhaler Inhale 2 puffs into the lungs every 6 (six) hours as needed for shortness of breath or wheezing.     albuterol (PROVENTIL) (2.5 MG/3ML) 0.083% nebulizer solution Take 3 mLs (2.5 mg total) by nebulization every 4 (four) hours as needed for wheezing or shortness of breath.     atorvastatin (LIPITOR) 40 MG tablet Take 1 tablet (40 mg total) by mouth daily. 30 tablet 2   budesonide-formoterol (SYMBICORT) 80-4.5 MCG/ACT inhaler Take 2 puffs first thing in am and then another 2 puffs about 12 hours later. 1 each 12   gabapentin (NEURONTIN) 300 MG capsule Take 300 mg by mouth 3 (three) times daily.     guaiFENesin (MUCINEX) 600 MG 12 hr tablet Take 1 tablet (600 mg total) by mouth 2 (two) times daily. 12 tablet 0   hydrOXYzine (ATARAX/VISTARIL) 50 MG tablet Take 50 mg by mouth 3 (three) times daily as needed for anxiety.     ondansetron (ZOFRAN-ODT) 8 MG disintegrating tablet Take 8 mg  by mouth every 6 (six) hours as needed for vomiting or nausea.     OXYGEN Inhale 4 L into the lungs continuous as needed (shortness of breath).     pantoprazole (PROTONIX) 40 MG tablet TAKE ONE TABLET BY MOUTH DAILY 30-60 MINUTES BEFORE FIRST MEAL OF THE DAY 30 tablet 0   QUEtiapine (SEROQUEL) 100 MG tablet Take 100 mg by mouth at bedtime.     No current facility-administered medications for this visit.    Review of Systems: GENERAL: negative for malaise, night sweats HEENT: No changes in hearing or vision, no nose bleeds or other nasal problems. NECK: Negative for lumps, goiter, pain and significant neck swelling RESPIRATORY: Negative for cough, wheezing CARDIOVASCULAR: Negative for chest pain, leg swelling, palpitations, orthopnea GI: SEE HPI MUSCULOSKELETAL: Negative for joint pain or swelling, back pain, and muscle pain. SKIN: Negative for  lesions, rash PSYCH: Negative for sleep disturbance, mood disorder and recent psychosocial stressors. HEMATOLOGY Negative for prolonged bleeding, bruising easily, and swollen nodes. ENDOCRINE: Negative for cold or heat intolerance, polyuria, polydipsia and goiter. NEURO: negative for tremor, gait imbalance, syncope and seizures. The remainder of the review of systems is noncontributory.   Physical Exam: BP 124/86 (BP Location: Right Arm, Patient Position: Sitting, Cuff Size: Normal)   Pulse 87   Temp 98.7 F (37.1 C) (Oral)   Ht 5\' 4"  (1.626 m)   Wt 118 lb 11.2 oz (53.8 kg)   BMI 20.37 kg/m  GENERAL: The patient is AO x3, in no acute distress. HEENT: Head is normocephalic and atraumatic. EOMI are intact. Mouth is well hydrated and without lesions. NECK: Supple. No masses LUNGS: Clear to auscultation. No presence of rhonchi/wheezing/rales. Adequate chest expansion HEART: RRR, normal s1 and s2. ABDOMEN: tender to palpation diffusely but worse in lower abdomen, no guarding, no peritoneal signs, and nondistended. BS +. No masses. EXTREMITIES: Without any cyanosis, clubbing, rash, lesions or edema. NEUROLOGIC: AOx3, no focal motor deficit. SKIN: no jaundice, no rashes   Imaging/Labs: as above  I personally reviewed and interpreted the available labs, imaging and endoscopic files.  Impression and Plan: [name] [name] is a 61 y.o. female with PMH GERD, coronary artery disease, heart failure, COPD, CVA, pulmonary embolism, hiatal hernia, collagen vascular disease, unclear history of ulcerative colitis, who presents for evaluation of nausea and vomiting.  The patient has presented chronic recurrent episodes of nausea and vomiting of unclear etiology which have led to significant weight loss through the years and have been present more frequently recently.  We will perform blood work-up today to check metabolic etiologies for recurrent vomiting (cortisol, TSH and celiac disease panel), but  we will also explore her upper gastrointestinal tract with an EGD.  For now, she can continue with her current regimen of pantoprazole and Zofran as needed.  I advised about the importance of avoiding NSAIDs as this could aggravate her chronic gastrointestinal symptoms.  If negative work-up, we may need to proceed with cross-sectional abdominal imaging and consider performing U-Tox given her unclear history of opiate use.  Finally, she is due for colorectal cancer screening, we will proceed with colonoscopy given her family history of colon cancer.  - Schedule EGD and colonoscopy - CBC, cortisol, TSH and celiac disease panel in early AM - Continue pantoprazole 40 mg twice a day - Continue Zofran as needed for nausea - Stop using high dose aspirin including Goody/BC powders, NSAIDs such as Aleve, ibuprofen, naproxen, Motrin, Voltaren or Advil (even the topical ones) - If negative  workup, may discuss CT abdomen and pelvis in next appointment - May consider performing U-tox if persistent symptoms  All questions were answered.      [name] Blazinganiel Castaneda, MD Gastroenterology and Hepatology [facility]

## 2022-05-14 ENCOUNTER — Telehealth: Payer: Self-pay | Admitting: *Deleted

## 2022-05-14 MED ORDER — PEG 3350-KCL-NA BICARB-NACL 420 G PO SOLR: 4000.0000 mL | Freq: Once | ORAL | 0 refills | Status: DC

## 2022-05-14 MED ORDER — PEG 3350-KCL-NA BICARB-NACL 420 G PO SOLR: 4000.0000 mL | Freq: Once | ORAL | 0 refills | Status: AC

## 2022-05-14 NOTE — Addendum Note (Signed)
Addended by: Armstead Peaks on: 05/14/2022 08:25 AM   Modules accepted: Orders

## 2022-05-14 NOTE — Telephone Encounter (Signed)
PA approved via [facility]. Auth#: [id number], DOS: May 31, 2022 - Jun 30, 2022

## 2022-05-14 NOTE — Addendum Note (Signed)
Addended by: Armstead Peaks on: 05/14/2022 10:11 AM   Modules accepted: Orders

## 2022-05-30 ENCOUNTER — Telehealth (INDEPENDENT_AMBULATORY_CARE_PROVIDER_SITE_OTHER): Payer: Self-pay | Admitting: *Deleted

## 2022-05-30 NOTE — Telephone Encounter (Signed)
Pt called in to cancel procedure for tomorrow. Reports she will have to call back to reschedule at another time

## 2022-05-31 ENCOUNTER — Ambulatory Visit (HOSPITAL_COMMUNITY): Admission: RE | Admit: 2022-05-31 | Payer: Medicare Other | Source: Home / Self Care | Admitting: Gastroenterology

## 2022-05-31 ENCOUNTER — Encounter (HOSPITAL_COMMUNITY): Admission: RE | Payer: Self-pay | Source: Home / Self Care

## 2022-05-31 SURGERY — COLONOSCOPY WITH PROPOFOL
Anesthesia: Monitor Anesthesia Care

## 2022-07-18 ENCOUNTER — Other Ambulatory Visit: Payer: Self-pay | Admitting: Neurosurgery

## 2022-07-25 ENCOUNTER — Ambulatory Visit: Payer: 59 | Admitting: Nurse Practitioner

## 2022-07-25 NOTE — Progress Notes (Deleted)
Cardiology Office Note:    Date:  07/25/2022   ID:  [name] [name], DOB 07-04-1960, MRN [id number]  PCP:  Lanelle Bal, [place] Prov[facility] Cardiologist:  Carlyle Dolly, MD { Click to update primary MD,subspecialty MD or APP then REFRESH:1}    Referring MD: Lanelle Bal, PA-C   No chief complaint on file. ***  History of Present Illness:    [name] [name] is a 62 y.o. female with a hx of ***  CAD Hyperlipidemia History of CVA History of PE COPD  Patient is a 62 year old female with past medical history as mentioned above.  Previous cardiovascular history includes history of prior stent in 2001.  Cardiac catheterization in 2010 showed patent coronary arteries and stent.  Cardiac catheterization in 2022 showed patent vessels and left circumflex stent.  Echocardiogram at that time revealed normal EF, no wall motion abnormalities, mild MR.   Last seen by Dr. Carlyle Dolly on June 11, 2021.  Patient was doing well at the time.  Denied any chest pain or shortness of breath.  No medication changes were made. Was told to follow-up in 6 months.     She presented to [facility] in October 2023 for altered mental status.   EMS found patient unresponsive at home on the floor, pupils were constricted.  Patient was given Narcan by EMS to improve mental status.  Patient did not remember what happened.  EMS stated blood sugar was elevated.  She denied any pain.  She denied any drug use.  Blood pressure 84/58.  EKG showed normal sinus rhythm.  Our office has been contacted recently for preoperative cardiovascular risk assessment.  Procedure will be posterior cervical fusion with lateral mass fixation and instrumentation along C3-4, C4-5, and C5-6.  Surgery will be performed by Dr. Newman Pies on August 26, 2022.   Today she presents for follow-up.  She states.  Past Medical History:  Diagnosis Date   Acid reflux    Arthritis    CAD (coronary artery  disease)    S/P cath with stent in 2001   CHF (congestive heart failure) (HCC)    Collagen vascular disease (HCC)    COPD (chronic obstructive pulmonary disease) ([place])    CVA (cerebral vascular accident) ([place]) 1992   DU (duodenal ulcer) 09/19/2009   Hiatal hernia    EGD september 2007. normal except for small hiatal hernia. She underwent small bowel biopsy with history of diarrhea at that time. This was negative   History of colonoscopy    August 2007. She had a pedunculated polyp at 30 cm. which was hamartomatous polyp. She  is due for 5-year followup with family history of coln cancer in a brother at age 65   History of colonoscopy    2005 revealed a linear ulcer with scar versus inflammatory process at the mid right colon, but normal terminal ileum. A biopsy of this area revealed an ulceration, but nonspecific   History of coronary angiogram    CT angiogram in 2007. negative with normal mesenteric arteries   History of kidney stones    Migraine    Pneumonia    pt states she gets pneumonia about twice a year   Pulmonary embolus ([place]) 1999   Ulcerative colitis ([place])    Urinary frequency     Past Surgical History:  Procedure Laterality Date   ABDOMINAL HYSTERECTOMY  1985   ANTERIOR CERVICAL DECOMP/DISCECTOMY FUSION N/A 08/15/2021   Procedure: Cervical Three-Four, Cervical Four-Five, Cervical Five-Six  Anterior cervical decompression/discectomy/fusion/interbody prosthesis/plate/screws;  Surgeon: Newman Pies, MD;  Location: [place];  Service: Neurosurgery;  Laterality: N/A;  Cervical Three-Four, Cervical Four-Five, Cervical Five-Six  Anterior cervical decompression/discectomy/fusion/interbody prosthesis/plate/screws   APPENDECTOMY     CESAREAN SECTION  1979   CHOLECYSTECTOMY  2021   COLONOSCOPY  01/2006   Rourk-pedunculated polyp 30 cm hamartomatous polyp,   COLONOSCOPY  2005   Lineal ulcer or mid right colon, normal TI nonspecific biopsy   CORONARY ANGIOPLASTY WITH STENT  PLACEMENT  2001   [facility], [place]   ESOPHAGOGASTRODUODENOSCOPY  09/19/2009   Rourk -2 duodenal bulbar ulcers, small HH otherwise normal/tiny distal esophageal erosions with mild erosive reflux   ESOPHAGOGASTRODUODENOSCOPY  03/2006   Rourk-Hiatal hernia, negative small bowel biopsy   LEFT HEART CATH AND CORONARY ANGIOGRAPHY N/A 04/18/2021   Procedure: LEFT HEART CATH AND CORONARY ANGIOGRAPHY;  Surgeon: Early Osmond, MD;  Location: [facility];  Service: Cardiovascular;  Laterality: N/A;   SPINE SURGERY     TONSILLECTOMY  1986   TUBAL LIGATION     Unilateral oophorectomy with hysterectomy      Current Medications: No outpatient medications have been marked as taking for the 07/25/22 encounter (Appointment) with Finis Bud, NP.     Allergies:   Codeine, Hydrocodone, Sulfa antibiotics, and Hydrocodone-acetaminophen   Social History   Socioeconomic History   Marital status: Married    Spouse name: Not on file   Number of children: 2   Years of education: Not on file   Highest education level: Not on file  Occupational History   Occupation: [employer]  Tobacco Use   Smoking status: Every Day    Packs/day: 0.50    Years: 35.00    Total pack years: 17.50    Types: Cigarettes    Passive exposure: Current   Smokeless tobacco: Never   Tobacco comments:    1/3 of a pack 07/28/19  Vaping Use   Vaping Use: Never used  Substance and Sexual Activity   Alcohol use: No   Drug use: No   Sexual activity: Not Currently  Other Topics Concern   Not on file  Social History Narrative   Not on file   Social Determinants of Health   Financial Resource Strain: Not on file  Food Insecurity: Not on file  Transportation Needs: Not on file  Physical Activity: Not on file  Stress: Not on file  Social Connections: Not on file     Family History: The patient's ***family history includes Colon cancer (age of onset: 69) in her brother; Diabetes in her  sister; Heart attack (age of onset: 25) in her mother.  ROS:   Please see the history of present illness.    *** All other systems reviewed and are negative.  EKGs/Labs/Other Studies Reviewed:    The following studies were reviewed today: ***  EKG:  EKG is *** ordered today.  The ekg ordered today demonstrates ***  Recent Labs: 10/02/2021: B Natriuretic Peptide 150.0 02/14/2022: ALT 13; BUN 9; Creatinine, Ser 0.71; Hemoglobin 11.4; Platelets 293; Potassium 3.5; Sodium 140  Recent Lipid Panel    Component Value Date/Time   CHOL 106 04/17/2021 0702   CHOL 105 04/17/2021 0702   TRIG 97 04/17/2021 0702   TRIG 99 04/17/2021 0702   HDL 46 04/17/2021 0702   HDL 45 04/17/2021 0702   CHOLHDL 2.3 04/17/2021 0702   CHOLHDL 2.3 04/17/2021 0702   VLDL 19 04/17/2021 0702   VLDL 20  04/17/2021 0702   LDLCALC 41 04/17/2021 0702   LDLCALC 40 04/17/2021 0702     Risk Assessment/Calculations:   {Does this patient have ATRIAL FIBRILLATION?:[phone]}  No BP recorded.  {Refresh Note OR Click here to enter BP  :1}***         Physical Exam:    VS:  There were no vitals taken for this visit.    Wt Readings from Last 3 Encounters:  05/13/22 118 lb 11.2 oz (53.8 kg)  02/14/22 145 lb (65.8 kg)  02/11/22 135 lb (61.2 kg)     GEN: *** Well nourished, well developed in no acute distress HEENT: Normal NECK: No JVD; No carotid bruits LYMPHATICS: No lymphadenopathy CARDIAC: ***RRR, no murmurs, rubs, gallops RESPIRATORY:  Clear to auscultation without rales, wheezing or rhonchi  ABDOMEN: Soft, non-tender, non-distended MUSCULOSKELETAL:  No edema; No deformity  SKIN: Warm and dry NEUROLOGIC:  Alert and oriented x 3 PSYCHIATRIC:  Normal affect   ASSESSMENT:    No diagnosis found. PLAN:    In order of problems listed above:  ***      {Are you ordering a CV Procedure (e.g. stress test, cath, DCCV, TEE, etc)?   Press F2        :[id number]    Medication Adjustments/Labs and Tests  Ordered: Current medicines are reviewed at length with the patient today.  Concerns regarding medicines are outlined above.  No orders of the defined types were placed in this encounter.  No orders of the defined types were placed in this encounter.   There are no Patient Instructions on file for this visit.   Signed, Finis Bud, NP  07/25/2022 8:04 AM    [place]

## 2022-07-29 ENCOUNTER — Other Ambulatory Visit: Payer: Self-pay | Admitting: *Deleted

## 2022-07-29 ENCOUNTER — Ambulatory Visit: Payer: 59 | Attending: Nurse Practitioner | Admitting: Nurse Practitioner

## 2022-07-29 ENCOUNTER — Encounter: Payer: Self-pay | Admitting: Nurse Practitioner

## 2022-07-29 ENCOUNTER — Other Ambulatory Visit: Payer: Self-pay | Admitting: Nurse Practitioner

## 2022-07-29 ENCOUNTER — Other Ambulatory Visit (INDEPENDENT_AMBULATORY_CARE_PROVIDER_SITE_OTHER): Payer: 59

## 2022-07-29 VITALS — BP 112/77 | HR 76 | Ht 64.0 in | Wt 118.2 lb

## 2022-07-29 DIAGNOSIS — I2510 Atherosclerotic heart disease of native coronary artery without angina pectoris: Secondary | ICD-10-CM | POA: Diagnosis not present

## 2022-07-29 DIAGNOSIS — R55 Syncope and collapse: Secondary | ICD-10-CM | POA: Diagnosis not present

## 2022-07-29 DIAGNOSIS — Z8673 Personal history of transient ischemic attack (TIA), and cerebral infarction without residual deficits: Secondary | ICD-10-CM

## 2022-07-29 DIAGNOSIS — E785 Hyperlipidemia, unspecified: Secondary | ICD-10-CM | POA: Diagnosis not present

## 2022-07-29 DIAGNOSIS — Z79899 Other long term (current) drug therapy: Secondary | ICD-10-CM

## 2022-07-29 DIAGNOSIS — Z01810 Encounter for preprocedural cardiovascular examination: Secondary | ICD-10-CM | POA: Diagnosis not present

## 2022-07-29 NOTE — Patient Instructions (Addendum)
Medication Instructions:  Your physician recommends that you continue on your current medications as directed. Please refer to the Current Medication list given to you today.  Labwork: Your physician recommends that you return for a FASTING lipid panel, CMET, CBC & Magnesium levels tomorrow at [facility]. Please do not eat or drink for at least 8 hours when you have this done. You may take your medications that morning with a sip of water.  Testing/Procedures: Your physician has requested that you have an echocardiogram. Echocardiography is a painless test that uses sound waves to create images of your heart. It provides your doctor with information about the size and shape of your heart and how well your heart's chambers and valves are working. This procedure takes approximately one hour. There are no restrictions for this procedure. Please do NOT wear cologne, perfume, aftershave, or lotions (deodorant is allowed). Please arrive 15 minutes prior to your appointment time. Your physician has requested that you have a carotid duplex. This test is an ultrasound of the carotid arteries in your neck. It looks at blood flow through these arteries that supply the brain with blood. Allow one hour for this exam. There are no restrictions or special instructions. Your physician has recommended that you wear a Zio monitor.   This monitor is a medical device that records the heart's electrical activity. Doctors most often use these monitors to diagnose arrhythmias. Arrhythmias are problems with the speed or rhythm of the heartbeat. The monitor is a small device applied to your chest. You can wear one while you do your normal daily activities. While wearing this monitor if you have any symptoms to push the button and record what you felt. Once you have worn this monitor for the period of time provider prescribed (for 14 days), you will return the monitor device in the postage paid box. Once it is returned  they will download the data collected and provide [place] with a report which the provider will then review and we will call you with those results. Important tips:  Avoid showering during the first 48 hours of wearing the monitor. Avoid excessive sweating to help maximize wear time. Do not submerge the device, no hot tubs, and no swimming pools. Keep any lotions or oils away from the patch. After 48 hours you may shower with the patch on. Take brief showers with your back facing the shower head.  Do not remove patch once it has been placed because that will interrupt data and decrease adhesive wear time. Push the button when you have any symptoms and write down what you were feeling. Once you have completed wearing your monitor, remove and place into box which has postage paid and place in your outgoing mailbox.  If for some reason you have misplaced your box then call our office and we can provide another box and/or mail it off for you.  Follow-Up: Your physician recommends that you schedule a follow-up appointment in: 6-8 weeks  Any Other Special Instructions Will Be Listed Below (If Applicable). Please stay well hydrated Wear compression stockings Avoid driving per protocol Change positions slowly  If you need a refill on your cardiac medications before your next appointment, please call your pharmacy.

## 2022-07-29 NOTE — Progress Notes (Unsigned)
Cardiology Office Note:    Date:  07/29/2022  ID:  [name] [name], DOB 10/01/1960, MRN [id number]  PCP:  Lanelle Bal, [place] [facility] Cardiologist:  Carlyle Dolly, MD     Referring MD: Lanelle Bal, PA-C   CC: Pre-operative cardiovascular risk assessment  History of Present Illness:    [name] [name] is a very pleasant 62 y.o. female with a hx of the following:   CAD Hyperlipidemia History of CVA History of PE COPD  Patient is a 62 year old female with past medical history as mentioned above.  Previous cardiovascular history includes history of prior stent in 2001.  Cardiac catheterization in 2010 showed patent coronary arteries and stent.  Cardiac catheterization in 2022 showed patent vessels and left circumflex stent.  Echocardiogram at that time revealed normal EF, no wall motion abnormalities, mild MR.   Last seen by Dr. Carlyle Dolly on June 11, 2021.  Patient was doing well at the time.  Denied any chest pain or shortness of breath.  No medication changes were made. Was told to follow-up in 6 months.  She presented to [facility] in October 2023 for altered mental status.   EMS found patient unresponsive at home on the floor, pupils were constricted.  Patient was given Narcan by EMS to improve mental status.  Patient did not remember what happened.  EMS stated blood sugar was elevated.  She denied any pain.  She denied any drug use.  Blood pressure 84/58.  EKG showed normal sinus rhythm.  Our office has been contacted recently for preoperative cardiovascular risk assessment.  Procedure will be posterior cervical fusion with lateral mass fixation and instrumentation along C3-4, C4-5, and C5-6.  Surgery will be performed by Dr. Newman Pies on August 26, 2022.   Today she presents for follow-up.  She states she is doing well from a cardiac perspective.  However, she admits to 3 syncopal episodes, sounding vasovagal in nature.  She  describes 1 episode while going to the bathroom on the toilet and after standing up, fell down and hit her head after losing consciousness.  On Christmas Day 2023, she was making a meal, had diarrhea, and later on lost consciousness.  Denies any acute injuries or cardiac symptoms surrounding events. Denies any stroke-like symptoms. Denies any chest pain, shortness of breath, palpitations, orthopnea, PND, swelling or significant weight changes, acute bleeding, or claudication.  Does admit to lightheadedness and dizziness. Tolerating medications well.  Denies any other questions or concerns today.   Past Medical History:  Diagnosis Date   Acid reflux    Arthritis    CAD (coronary artery disease)    S/P cath with stent in 2001   CHF (congestive heart failure) (HCC)    Collagen vascular disease (HCC)    COPD (chronic obstructive pulmonary disease) ([place])    CVA (cerebral vascular accident) ([place]) 1992   DU (duodenal ulcer) 09/19/2009   Hiatal hernia    EGD september 2007. normal except for small hiatal hernia. She underwent small bowel biopsy with history of diarrhea at that time. This was negative   History of colonoscopy    August 2007. She had a pedunculated polyp at 30 cm. which was hamartomatous polyp. She  is due for 5-year followup with family history of coln cancer in a brother at age 84   History of colonoscopy    2005 revealed a linear ulcer with scar versus inflammatory process at the mid right colon, but normal terminal ileum.  A biopsy of this area revealed an ulceration, but nonspecific   History of coronary angiogram    CT angiogram in 2007. negative with normal mesenteric arteries   History of kidney stones    Migraine    Pneumonia    pt states she gets pneumonia about twice a year   Pulmonary embolus (HCC) 1999   Ulcerative colitis (HCC)    Urinary frequency     Past Surgical History:  Procedure Laterality Date   ABDOMINAL HYSTERECTOMY  1985   ANTERIOR CERVICAL  DECOMP/DISCECTOMY FUSION N/A 08/15/2021   Procedure: Cervical Three-Four, Cervical Four-Five, Cervical Five-Six  Anterior cervical decompression/discectomy/fusion/interbody prosthesis/plate/screws;  Surgeon: Tressie Stalker, MD;  Location: [facility] OR;  Service: Neurosurgery;  Laterality: N/A;  Cervical Three-Four, Cervical Four-Five, Cervical Five-Six  Anterior cervical decompression/discectomy/fusion/interbody prosthesis/plate/screws   APPENDECTOMY     CESAREAN SECTION  1979   CHOLECYSTECTOMY  2021   COLONOSCOPY  01/2006   Rourk-pedunculated polyp 30 cm hamartomatous polyp,   COLONOSCOPY  2005   Lineal ulcer or mid right colon, normal TI nonspecific biopsy   CORONARY ANGIOPLASTY WITH STENT PLACEMENT  2001   [facility], [place]   ESOPHAGOGASTRODUODENOSCOPY  09/19/2009   Rourk -2 duodenal bulbar ulcers, small HH otherwise normal/tiny distal esophageal erosions with mild erosive reflux   ESOPHAGOGASTRODUODENOSCOPY  03/2006   Rourk-Hiatal hernia, negative small bowel biopsy   LEFT HEART CATH AND CORONARY ANGIOGRAPHY N/A 04/18/2021   Procedure: LEFT HEART CATH AND CORONARY ANGIOGRAPHY;  Surgeon: Orbie Pyo, MD;  Location: [facility] INVASIVE CV LAB;  Service: Cardiovascular;  Laterality: N/A;   SPINE SURGERY     TONSILLECTOMY  1986   TUBAL LIGATION     Unilateral oophorectomy with hysterectomy      Current Medications: Current Meds  Medication Sig   albuterol (PROVENTIL HFA;VENTOLIN HFA) 108 (90 BASE) MCG/ACT inhaler Inhale 2 puffs into the lungs every 6 (six) hours as needed for shortness of breath or wheezing.   albuterol (PROVENTIL) (2.5 MG/3ML) 0.083% nebulizer solution Take 3 mLs (2.5 mg total) by nebulization every 4 (four) hours as needed for wheezing or shortness of breath.   atorvastatin (LIPITOR) 40 MG tablet Take 1 tablet (40 mg total) by mouth daily.   gabapentin (NEURONTIN) 300 MG capsule Take 300 mg by mouth 3 (three) times daily.   guaiFENesin (MUCINEX) 600 MG 12 hr tablet  Take 1 tablet (600 mg total) by mouth 2 (two) times daily.   hydrOXYzine (ATARAX/VISTARIL) 50 MG tablet Take 50 mg by mouth 3 (three) times daily as needed for anxiety.   ondansetron (ZOFRAN-ODT) 8 MG disintegrating tablet Take 8 mg by mouth every 6 (six) hours as needed for vomiting or nausea.   OXYGEN Inhale 4 L into the lungs continuous as needed (shortness of breath).   pantoprazole (PROTONIX) 40 MG tablet TAKE ONE TABLET BY MOUTH DAILY 30-60 MINUTES BEFORE FIRST MEAL OF THE DAY   QUEtiapine (SEROQUEL) 100 MG tablet Take 100 mg by mouth at bedtime.   traMADol (ULTRAM) 50 MG tablet Take 50-100 mg by mouth every 6 (six) hours as needed.     Allergies:   Codeine, Hydrocodone, Sulfa antibiotics, and Hydrocodone-acetaminophen   Social History   Socioeconomic History   Marital status: Married    Spouse name: Not on file   Number of children: 2   Years of education: Not on file   Highest education level: Not on file  Occupational History   Occupation: [employer]  Tobacco Use   Smoking  status: Every Day    Packs/day: 0.50    Years: 35.00    Total pack years: 17.50    Types: Cigarettes    Passive exposure: Current   Smokeless tobacco: Never   Tobacco comments:    1/3 of a pack 07/28/19  Vaping Use   Vaping Use: Never used  Substance and Sexual Activity   Alcohol use: No   Drug use: No   Sexual activity: Not Currently  Other Topics Concern   Not on file  Social History Narrative   Not on file   Social Determinants of Health   Financial Resource Strain: Not on file  Food Insecurity: Not on file  Transportation Needs: Not on file  Physical Activity: Not on file  Stress: Not on file  Social Connections: Not on file     Family History: The patient's family history includes Colon cancer (age of onset: 65) in her brother; Diabetes in her sister; Heart attack (age of onset: 50) in her mother.  ROS:   Review of Systems  Constitutional: Negative.   HENT:  Negative.    Eyes: Negative.   Respiratory: Negative.    Cardiovascular: Negative.   Gastrointestinal: Negative.   Genitourinary: Negative.   Musculoskeletal:  Positive for falls. Negative for back pain, joint pain, myalgias and neck pain.       See HPI.  Skin: Negative.   Neurological:  Positive for dizziness and loss of consciousness. Negative for tingling, tremors, sensory change, speech change, focal weakness, seizures, weakness and headaches.       See HPI.  Endo/Heme/Allergies: Negative.   Psychiatric/Behavioral: Negative.      Please see the history of present illness.    All other systems reviewed and are negative.  EKGs/Labs/Other Studies Reviewed:    The following studies were reviewed today:   EKG:  EKG is ordered today.  The ekg ordered today demonstrates NSR, 75 bpm, no acute ischemic changes.    Left heart cath on April 18, 2021:   Prox RCA lesion is 20% stenosed.   Mid Cx lesion is 10% stenosed.   LV end diastolic pressure is normal.   1.  Widely patent mid left circumflex stent with mild disease elsewhere. 2.  Normal LV function with LVEDP of 17 mmHg.  Echocardiogram on April 17, 2021: 1. Left ventricular ejection fraction, by estimation, is 65 to 70%. The  left ventricle has normal function. The left ventricle has no regional  wall motion abnormalities. Left ventricular diastolic parameters are  indeterminate.   2. Right ventricular systolic function is normal. The right ventricular  size is normal. There is normal pulmonary artery systolic pressure. The  estimated right ventricular systolic pressure is 34.0 mmHg.   3. The mitral valve is grossly normal. Mild mitral valve regurgitation.   4. The aortic valve is tricuspid. Aortic valve regurgitation is not  visualized.   5. The inferior vena cava is normal in size with <50% respiratory  variability, suggesting right atrial pressure of 8 mmHg.   Comparison(s): No prior Echocardiogram.  Recent  Labs: 10/02/2021: B Natriuretic Peptide 150.0 02/14/2022: ALT 13; BUN 9; Creatinine, Ser 0.71; Hemoglobin 11.4; Platelets 293; Potassium 3.5; Sodium 140  Recent Lipid Panel    Component Value Date/Time   CHOL 106 04/17/2021 0702   CHOL 105 04/17/2021 0702   TRIG 97 04/17/2021 0702   TRIG 99 04/17/2021 0702   HDL 46 04/17/2021 0702   HDL 45 04/17/2021 0702   CHOLHDL 2.3 04/17/2021 3875  CHOLHDL 2.3 04/17/2021 0702   VLDL 19 04/17/2021 0702   VLDL 20 04/17/2021 0702   LDLCALC 41 04/17/2021 0702   LDLCALC 40 04/17/2021 0702    Physical Exam:    VS:  BP 112/77 (BP Location: Left Arm, Cuff Size: Normal)   Pulse 76   Ht 5\' 4"  (1.626 m)   Wt 118 lb 3.2 oz (53.6 kg)   SpO2 100%   BMI 20.29 kg/m     Wt Readings from Last 3 Encounters:  07/29/22 118 lb 3.2 oz (53.6 kg)  05/13/22 118 lb 11.2 oz (53.8 kg)  02/14/22 145 lb (65.8 kg)     GEN: Thin, 62 y.o. female in no acute distress HEENT: Normal NECK: No JVD; No carotid bruits CARDIAC: S1/S2, RRR, no murmurs, rubs, gallops; 2+ pulses RESPIRATORY:  Clear to auscultation without rales, wheezing or rhonchi  MUSCULOSKELETAL:  No edema; No deformity  SKIN: Warm and dry NEUROLOGIC:  Alert and oriented x 3 PSYCHIATRIC:  Normal affect   ASSESSMENT:    1. Pre-operative cardiovascular examination   2. Syncope, unspecified syncope type   3. Coronary artery disease involving native coronary artery of native heart without angina pectoris   4. Hyperlipidemia, unspecified hyperlipidemia type   5. Medication management   6. History of CVA (cerebrovascular accident)    PLAN:    In order of problems listed above:  1.  Preoperative cardiovascular risk assessment Ms. Hetzer's perioperative risk of a major cardiac event is 6.6% according to the Revised Cardiac Risk Index (RCRI).  Therefore, she is at high risk for perioperative complications.   Her functional capacity is good at 5.07 METs according to the Duke Activity Status Index  (DASI). Recommendations: According to ACC/AHA guidelines, no further cardiovascular testing needed.  The patient may proceed to surgery at acceptable risk.  However, she has had 3 syncopal episodes and is requiring further cardiovascular workup prior to surgery-see below. Antiplatelet and/or Anticoagulation Recommendations: She is not on any antiplatelet or anticoagulation medication that needs to be held prior to surgery.  2. Syncope Etiology unclear, but does sound vasovagal in nature.  Negative orthostatics in office today.  Will update echocardiogram, arrange carotid Dopplers, and initiate 14-day live monitor to rule out cardiac causes for syncope.  Conservative measures discussed for dizziness and lightheadedness, including adequate hydration, wearing compression stockings, and changing positions slowly.  Discussed to avoid driving. She verbalizes understanding.  Highly suspect that several medications that she is taking may be contributing to this including: tramadol, Seroquel, and hydroxyzine. Continue to follow with PCP. Will obtain the following lab work: CBC, Mag, and CMET. Heart healthy diet and regular cardiovascular exercise encouraged.     3. CAD Stable with no anginal symptoms. No indication for ischemic evaluation. Continue current medication regimen. Heart healthy diet and regular cardiovascular exercise encouraged.   4. HLD, medication management She is due for labs.  Will arrange fasting lipid panel and CMET.  Continue atorvastatin. Heart healthy diet and regular cardiovascular exercise encouraged.   5. History of CVA  Denies any stroke-like symptoms. Only syncopal episodes as mentioned above. Previously stopped on ASA and plavix stopped. Continue to follow with PCP.  6. Disposition: Follow-up with me or APP in 6-8 weeks or sooner if anything changes.   Medication Adjustments/Labs and Tests Ordered: Current medicines are reviewed at length with the patient today.  Concerns  regarding medicines are outlined above.  Orders Placed This Encounter  Procedures   Comprehensive metabolic panel   Magnesium  Lipid panel   CBC   EKG 12-Lead   ECHOCARDIOGRAM COMPLETE   VAS US CAROTID   No orders of the defined types were placed in this encounter.   Patient Instructions  Medication Instructions:  Your physician recommends that you continue on your current medications as directed. Please refer to the Current Medication list given to you today.  Labwork: Your physician recommends that you return for a FASTING lipid panel, CMET, CBC & Magnesium levels tomorrow at [facility]. Please do not eat or drink for at least 8 hours when you have this done. You may take your medications that morning with a sip of water.  Testing/Procedures: Your physician has requested that you have an echocardiogram. Echocardiography is a painless test that uses sound waves to create images of your heart. It provides your doctor with information about the size and shape of your heart and how well your heart's chambers and valves are working. This procedure takes approximately one hour. There are no restrictions for this procedure. Please do NOT wear cologne, perfume, aftershave, or lotions (deodorant is allowed). Please arrive 15 minutes prior to your appointment time. Your physician has requested that you have a carotid duplex. This test is an ultrasound of the carotid arteries in your neck. It looks at blood flow through these arteries that supply the brain with blood. Allow one hour for this exam. There are no restrictions or special instructions. Your physician has recommended that you wear a Zio monitor.   This monitor is a medical device that records the heart's electrical activity. Doctors most often use these monitors to diagnose arrhythmias. Arrhythmias are problems with the speed or rhythm of the heartbeat. The monitor is a small device applied to your chest. You can wear one while  you do your normal daily activities. While wearing this monitor if you have any symptoms to push the button and record what you felt. Once you have worn this monitor for the period of time provider prescribed (for 14 days), you will return the monitor device in the postage paid box. Once it is returned they will download the data collected and provide [place] with a report which the provider will then review and we will call you with those results. Important tips:  Avoid showering during the first 48 hours of wearing the monitor. Avoid excessive sweating to help maximize wear time. Do not submerge the device, no hot tubs, and no swimming pools. Keep any lotions or oils away from the patch. After 48 hours you may shower with the patch on. Take brief showers with your back facing the shower head.  Do not remove patch once it has been placed because that will interrupt data and decrease adhesive wear time. Push the button when you have any symptoms and write down what you were feeling. Once you have completed wearing your monitor, remove and place into box which has postage paid and place in your outgoing mailbox.  If for some reason you have misplaced your box then call our office and we can provide another box and/or mail it off for you.  Follow-Up: Your physician recommends that you schedule a follow-up appointment in: 6-8 weeks  Any Other Special Instructions Will Be Listed Below (If Applicable). Please stay well hydrated Wear compression stockings Avoid driving per protocol Change positions slowly  If you need a refill on your cardiac medications before your next appointment, please call your pharmacy.   SignedFinis Bud, NP  07/30/2022 7:32 AM  [place]

## 2022-07-31 ENCOUNTER — Other Ambulatory Visit: Payer: Self-pay | Admitting: Neurosurgery

## 2022-08-01 ENCOUNTER — Other Ambulatory Visit: Payer: Self-pay | Admitting: Nurse Practitioner

## 2022-08-01 ENCOUNTER — Ambulatory Visit: Payer: 59 | Admitting: Nurse Practitioner

## 2022-08-01 DIAGNOSIS — Z01810 Encounter for preprocedural cardiovascular examination: Secondary | ICD-10-CM

## 2022-08-01 DIAGNOSIS — E785 Hyperlipidemia, unspecified: Secondary | ICD-10-CM

## 2022-08-01 DIAGNOSIS — R55 Syncope and collapse: Secondary | ICD-10-CM

## 2022-08-01 DIAGNOSIS — Z79899 Other long term (current) drug therapy: Secondary | ICD-10-CM

## 2022-08-01 DIAGNOSIS — I2510 Atherosclerotic heart disease of native coronary artery without angina pectoris: Secondary | ICD-10-CM

## 2022-08-01 DIAGNOSIS — Z8673 Personal history of transient ischemic attack (TIA), and cerebral infarction without residual deficits: Secondary | ICD-10-CM

## 2022-08-15 ENCOUNTER — Other Ambulatory Visit: Payer: 59

## 2022-08-19 ENCOUNTER — Ambulatory Visit (INDEPENDENT_AMBULATORY_CARE_PROVIDER_SITE_OTHER): Payer: 59 | Admitting: Gastroenterology

## 2022-08-21 ENCOUNTER — Encounter: Payer: Self-pay | Admitting: Nurse Practitioner

## 2022-08-21 NOTE — Progress Notes (Signed)
Surgical Instructions    Your procedure is scheduled on Monday, 08/26/22.  Report to [facility] Main Entrance "A" at 9:00 A.M., then check in with the Admitting office.  Call this number if you have problems the morning of surgery:  [phone]   If you have any questions prior to your surgery date call [phone]: Open Monday-Friday 8am-4pm If you experience any cold or flu symptoms such as cough, fever, chills, shortness of breath, etc. between now and your scheduled surgery, please notify us at the above number     Remember:  Do not eat or drink after midnight the night before your surgery    Take these medicines the morning of surgery with A SIP OF WATER:  atorvastatin (LIPITOR)   IF NEEDED: albuterol (PROVENTIL HFA;VENTOLIN HFA) inhaler- bring with you the day of surgery albuterol (PROVENTIL) (2.5 MG/3ML) nebulizer ondansetron (ZOFRAN-ODT)  traMADol (ULTRAM)   As of today, STOP taking any Aspirin (unless otherwise instructed by your surgeon) Aleve, Naproxen, Ibuprofen, Motrin, Advil, Goody's, BC's, all herbal medications, fish oil, and all vitamins.           Do not wear jewelry or makeup. Do not wear lotions, powders, perfumes or deodorant. Do not shave 48 hours prior to surgery.   Do not bring valuables to the hospital. Do not wear nail polish, gel polish, artificial nails, or any other type of covering on natural nails (fingers and toes) If you have artificial nails or gel coating that need to be removed by a nail salon, please have this removed prior to surgery. Artificial nails or gel coating may interfere with anesthesia's ability to adequately monitor your vital signs.  [facility] is not responsible for any belongings or valuables.    Do NOT Smoke (Tobacco/Vaping)  24 hours prior to your procedure  If you use a CPAP at night, you may bring your mask for your overnight stay.   Contacts, glasses, hearing aids, dentures or partials may not be worn into surgery,  please bring cases for these belongings   For patients admitted to the hospital, discharge time will be determined by your treatment team.   Patients discharged the day of surgery will not be allowed to drive home, and someone needs to stay with them for 24 hours.   SURGICAL WAITING ROOM VISITATION Patients having surgery or a procedure may have no more than 2 support people in the waiting area - these visitors may rotate.   Children under the age of 69 must have an adult with them who is not the patient. If the patient needs to stay at the hospital during part of their recovery, the visitor guidelines for inpatient rooms apply. Pre-op nurse will coordinate an appropriate time for 1 support person to accompany patient in pre-op.  This support person may not rotate.   Please refer to [email or web address].hu for the visitor guidelines for Inpatients (after your surgery is over and you are in a regular room).    Special instructions:    Oral Hygiene is also important to reduce your risk of infection.  Remember - BRUSH YOUR TEETH THE MORNING OF SURGERY WITH YOUR REGULAR TOOTHPASTE   [place]- Preparing For Surgery  Before surgery, you can play an important role. Because skin is not sterile, your skin needs to be as free of germs as possible. You can reduce the number of germs on your skin by washing with CHG (chlorahexidine gluconate) Soap before surgery.  CHG is an antiseptic cleaner which kills germs  and bonds with the skin to continue killing germs even after washing.     Please do not use if you have an allergy to CHG or antibacterial soaps. If your skin becomes reddened/irritated stop using the CHG.  Do not shave (including legs and underarms) for at least 48 hours prior to first CHG shower. It is OK to shave your face.  Please follow these instructions carefully.     Shower the NIGHT BEFORE SURGERY and the MORNING OF SURGERY with CHG  Soap.   If you chose to wash your hair, wash your hair first as usual with your normal shampoo. After you shampoo, rinse your hair and body thoroughly to remove the shampoo.  Then [employer] and genitals (private parts) with your normal soap and rinse thoroughly to remove soap.  After that Use CHG Soap as you would any other liquid soap. You can apply CHG directly to the skin and wash gently with a scrungie or a clean washcloth.   Apply the CHG Soap to your body ONLY FROM THE NECK DOWN.  Do not use on open wounds or open sores. Avoid contact with your eyes, ears, mouth and genitals (private parts). Wash Face and genitals (private parts)  with your normal soap.   Wash thoroughly, paying special attention to the area where your surgery will be performed.  Thoroughly rinse your body with warm water from the neck down.  DO NOT shower/wash with your normal soap after using and rinsing off the CHG Soap.  Pat yourself dry with a CLEAN TOWEL.  Wear CLEAN PAJAMAS to bed the night before surgery  Place CLEAN SHEETS on your bed the night before your surgery  DO NOT SLEEP WITH PETS.   Day of Surgery: Take a shower with CHG soap. Wear Clean/Comfortable clothing the morning of surgery Do not apply any deodorants/lotions.   Remember to brush your teeth WITH YOUR REGULAR TOOTHPASTE.    If you received a COVID test during your pre-op visit, it is requested that you wear a mask when out in public, stay away from anyone that may not be feeling well, and notify your surgeon if you develop symptoms. If you have been in contact with anyone that has tested positive in the last 10 days, please notify your surgeon.    Please read over the following fact sheets that you were given.

## 2022-08-22 ENCOUNTER — Other Ambulatory Visit: Payer: Self-pay

## 2022-08-22 ENCOUNTER — Encounter (HOSPITAL_COMMUNITY): Payer: Self-pay

## 2022-08-22 ENCOUNTER — Encounter (HOSPITAL_COMMUNITY)
Admission: RE | Admit: 2022-08-22 | Discharge: 2022-08-22 | Disposition: A | Discharge status: Home / Self Care | Payer: 59 | Source: Ambulatory Visit | Attending: Neurosurgery | Admitting: Neurosurgery

## 2022-08-22 VITALS — BP 113/83 | HR 73 | Temp 98.3°F | Resp 17 | Ht 64.0 in | Wt 116.4 lb

## 2022-08-22 DIAGNOSIS — Z01812 Encounter for preprocedural laboratory examination: Secondary | ICD-10-CM | POA: Diagnosis not present

## 2022-08-22 DIAGNOSIS — Z01818 Encounter for other preprocedural examination: Secondary | ICD-10-CM

## 2022-08-22 LAB — SURGICAL PCR SCREEN
MRSA, PCR: NEGATIVE
Staphylococcus aureus: POSITIVE — AB

## 2022-08-22 LAB — TYPE AND SCREEN
ABO/RH(D): O POS
Antibody Screen: NEGATIVE

## 2022-08-22 NOTE — Progress Notes (Addendum)
PCP - Lanelle Bal, PA-C Cardiologist - Dr. Carlyle Dolly  PPM/ICD - denies  Chest x-ray - 10/02/21 EKG - 07/29/22 Stress Test - denies ECHO - 04/17/21 Cardiac Cath - 04/18/21  Sleep Study - denies   DM- denies  ASA/Blood Thinner Instructions: n/a   ERAS Protcol - no, NPO   COVID TEST- n/a   Anesthesia review: yes, cardiac hx. Clearance note confusing as to whether ECHO is needed prior to surgery. Ebony Hail, APP, aware. Pt advised to call Dr. Nelly Laurence office ASAP to see if ECHO is needed and if so, to schedule the ECHO ASAP. Also, pt uses 4L O2 PRN (mostly at bedtime)  Patient denies shortness of breath, fever, cough and chest pain at PAT appointment   All instructions explained to the patient, with a verbal understanding of the material. Patient agrees to go over the instructions while at home for a better understanding. The opportunity to ask questions was provided.

## 2022-08-23 DIAGNOSIS — R0902 Hypoxemia: Secondary | ICD-10-CM | POA: Diagnosis not present

## 2022-08-23 DIAGNOSIS — J441 Chronic obstructive pulmonary disease with (acute) exacerbation: Secondary | ICD-10-CM | POA: Diagnosis not present

## 2022-08-26 ENCOUNTER — Encounter (HOSPITAL_COMMUNITY): Admission: RE | Payer: Self-pay | Source: Ambulatory Visit

## 2022-08-26 ENCOUNTER — Inpatient Hospital Stay (HOSPITAL_COMMUNITY): Admission: RE | Admit: 2022-08-26 | Payer: 59 | Source: Ambulatory Visit | Admitting: Neurosurgery

## 2022-08-26 SURGERY — POSTERIOR CERVICAL FUSION/FORAMINOTOMY LEVEL 3
Anesthesia: General

## 2022-08-29 ENCOUNTER — Telehealth: Payer: Self-pay | Admitting: *Deleted

## 2022-08-29 ENCOUNTER — Telehealth: Payer: Self-pay | Admitting: Nurse Practitioner

## 2022-08-29 NOTE — Telephone Encounter (Signed)
Email notification received from [employer] that monitor has not been returned. Called patient but no answer and was unable to leave message due to full voicemail

## 2022-08-29 NOTE — Telephone Encounter (Signed)
Says she  mailed monitor on Tuesday this week.

## 2022-08-29 NOTE — Telephone Encounter (Signed)
Patient has re-scheduled [facility] (insurance approval expires on [id number]) Checking percert for new date of 09/18/2022

## 2022-09-18 ENCOUNTER — Other Ambulatory Visit: Payer: 59

## 2022-09-21 DIAGNOSIS — R0902 Hypoxemia: Secondary | ICD-10-CM | POA: Diagnosis not present

## 2022-09-21 DIAGNOSIS — J441 Chronic obstructive pulmonary disease with (acute) exacerbation: Secondary | ICD-10-CM | POA: Diagnosis not present

## 2022-09-23 ENCOUNTER — Emergency Department (HOSPITAL_COMMUNITY): Payer: 59

## 2022-09-23 ENCOUNTER — Ambulatory Visit: Payer: 59 | Attending: Nurse Practitioner | Admitting: Nurse Practitioner

## 2022-09-23 ENCOUNTER — Encounter: Payer: Self-pay | Admitting: Nurse Practitioner

## 2022-09-23 ENCOUNTER — Encounter (HOSPITAL_COMMUNITY): Payer: Self-pay | Admitting: *Deleted

## 2022-09-23 ENCOUNTER — Emergency Department (HOSPITAL_COMMUNITY)
Admission: EM | Admit: 2022-09-23 | Discharge: 2022-09-23 | Disposition: A | Discharge status: Home / Self Care | Payer: 59 | Attending: Emergency Medicine | Admitting: Emergency Medicine

## 2022-09-23 ENCOUNTER — Other Ambulatory Visit: Payer: Self-pay

## 2022-09-23 VITALS — BP 146/102 | HR 112 | Ht 64.0 in | Wt 116.6 lb

## 2022-09-23 DIAGNOSIS — Z20822 Contact with and (suspected) exposure to covid-19: Secondary | ICD-10-CM | POA: Insufficient documentation

## 2022-09-23 DIAGNOSIS — I2510 Atherosclerotic heart disease of native coronary artery without angina pectoris: Secondary | ICD-10-CM | POA: Insufficient documentation

## 2022-09-23 DIAGNOSIS — I509 Heart failure, unspecified: Secondary | ICD-10-CM | POA: Insufficient documentation

## 2022-09-23 DIAGNOSIS — E785 Hyperlipidemia, unspecified: Secondary | ICD-10-CM | POA: Diagnosis not present

## 2022-09-23 DIAGNOSIS — J439 Emphysema, unspecified: Secondary | ICD-10-CM | POA: Diagnosis not present

## 2022-09-23 DIAGNOSIS — S299XXA Unspecified injury of thorax, initial encounter: Secondary | ICD-10-CM | POA: Diagnosis present

## 2022-09-23 DIAGNOSIS — R0989 Other specified symptoms and signs involving the circulatory and respiratory systems: Secondary | ICD-10-CM

## 2022-09-23 DIAGNOSIS — X58XXXA Exposure to other specified factors, initial encounter: Secondary | ICD-10-CM | POA: Diagnosis not present

## 2022-09-23 DIAGNOSIS — I10 Essential (primary) hypertension: Secondary | ICD-10-CM | POA: Diagnosis not present

## 2022-09-23 DIAGNOSIS — S2220XA Unspecified fracture of sternum, initial encounter for closed fracture: Secondary | ICD-10-CM | POA: Diagnosis not present

## 2022-09-23 DIAGNOSIS — R059 Cough, unspecified: Secondary | ICD-10-CM | POA: Diagnosis not present

## 2022-09-23 DIAGNOSIS — J9811 Atelectasis: Secondary | ICD-10-CM | POA: Diagnosis not present

## 2022-09-23 DIAGNOSIS — R7989 Other specified abnormal findings of blood chemistry: Secondary | ICD-10-CM | POA: Diagnosis not present

## 2022-09-23 DIAGNOSIS — J441 Chronic obstructive pulmonary disease with (acute) exacerbation: Secondary | ICD-10-CM

## 2022-09-23 DIAGNOSIS — R079 Chest pain, unspecified: Secondary | ICD-10-CM | POA: Diagnosis not present

## 2022-09-23 DIAGNOSIS — S2222XA Fracture of body of sternum, initial encounter for closed fracture: Secondary | ICD-10-CM | POA: Diagnosis not present

## 2022-09-23 DIAGNOSIS — R000 Tachycardia, unspecified: Secondary | ICD-10-CM | POA: Diagnosis not present

## 2022-09-23 DIAGNOSIS — R0789 Other chest pain: Secondary | ICD-10-CM | POA: Diagnosis not present

## 2022-09-23 LAB — COMPREHENSIVE METABOLIC PANEL
ALT: 11 U/L (ref 0–44)
AST: 15 U/L (ref 15–41)
Albumin: 4.5 g/dL (ref 3.5–5.0)
Alkaline Phosphatase: 93 U/L (ref 38–126)
Anion gap: 9 (ref 5–15)
BUN: 14 mg/dL (ref 8–23)
CO2: 28 mmol/L (ref 22–32)
Calcium: 9.4 mg/dL (ref 8.9–10.3)
Chloride: 98 mmol/L (ref 98–111)
Creatinine, Ser: 0.65 mg/dL (ref 0.44–1.00)
GFR, Estimated: >60 mL/min (ref >60)
Glucose, Bld: 100 mg/dL — ABNORMAL HIGH (ref 70–99)
Potassium: 3.9 mmol/L (ref 3.5–5.1)
Sodium: 135 mmol/L (ref 135–145)
Total Bilirubin: 0.5 mg/dL (ref 0.3–1.2)
Total Protein: 7.8 g/dL (ref 6.5–8.1)

## 2022-09-23 LAB — CBC WITH DIFFERENTIAL/PLATELET
Abs Immature Granulocytes: 0.03 10*3/uL (ref 0.00–0.07)
Basophils Absolute: 0.1 10*3/uL (ref 0.0–0.1)
Basophils Relative: 1 %
Eosinophils Absolute: 0.3 10*3/uL (ref 0.0–0.5)
Eosinophils Relative: 3 %
HCT: 41.4 % (ref 36.0–46.0)
Hemoglobin: 13.9 g/dL (ref 12.0–15.0)
Immature Granulocytes: 0 %
Lymphocytes Relative: 22 %
Lymphs Abs: 2.2 10*3/uL (ref 0.7–4.0)
MCH: 29.7 pg (ref 26.0–34.0)
MCHC: 33.6 g/dL (ref 30.0–36.0)
MCV: 88.5 fL (ref 80.0–100.0)
Monocytes Absolute: 0.8 10*3/uL (ref 0.1–1.0)
Monocytes Relative: 8 %
Neutro Abs: 6.4 10*3/uL (ref 1.7–7.7)
Neutrophils Relative %: 66 %
Platelets: 288 10*3/uL (ref 150–400)
RBC: 4.68 MIL/uL (ref 3.87–5.11)
RDW: 12.8 % (ref 11.5–15.5)
WBC: 9.7 10*3/uL (ref 4.0–10.5)
nRBC: 0 % (ref 0.0–0.2)

## 2022-09-23 LAB — RESP PANEL BY RT-PCR (RSV, FLU A&B, COVID)  RVPGX2
Influenza A by PCR: NEGATIVE
Influenza B by PCR: NEGATIVE
Resp Syncytial Virus by PCR: NEGATIVE
SARS Coronavirus 2 by RT PCR: NEGATIVE

## 2022-09-23 LAB — D-DIMER, QUANTITATIVE: D-Dimer, Quant: 0.76 ug/mL-FEU — ABNORMAL HIGH (ref 0.00–0.50)

## 2022-09-23 LAB — TROPONIN I (HIGH SENSITIVITY)
Troponin I (High Sensitivity): 3 ng/L (ref <18)
Troponin I (High Sensitivity): 3 ng/L (ref <18)

## 2022-09-23 LAB — LACTIC ACID, PLASMA: Lactic Acid, Venous: 1.1 mmol/L (ref 0.5–1.9)

## 2022-09-23 LAB — MAGNESIUM: Magnesium: 1.8 mg/dL (ref 1.7–2.4)

## 2022-09-23 LAB — TSH: TSH: 1.379 u[IU]/mL (ref 0.350–4.500)

## 2022-09-23 MED ORDER — PREDNISONE 20 MG PO TABS: 40.0000 mg | ORAL_TABLET | Freq: Every day | ORAL | 0 refills | Status: DC

## 2022-09-23 MED ORDER — IOHEXOL 350 MG/ML SOLN
75.0000 mL | Freq: Once | INTRAVENOUS | Status: AC | PRN
  Administered 2022-09-23: 75 mL via INTRAVENOUS

## 2022-09-23 MED ORDER — IPRATROPIUM-ALBUTEROL 0.5-2.5 (3) MG/3ML IN SOLN
3.0000 mL | Freq: Once | RESPIRATORY_TRACT | Status: AC
  Administered 2022-09-23: 3 mL via RESPIRATORY_TRACT
  Filled 2022-09-23: qty 3

## 2022-09-23 MED ORDER — PREDNISONE 50 MG PO TABS
60.0000 mg | ORAL_TABLET | Freq: Once | ORAL | Status: AC
  Administered 2022-09-23: 60 mg via ORAL
  Filled 2022-09-23: qty 1

## 2022-09-23 NOTE — Patient Instructions (Addendum)
Medication Instructions:  Your physician recommends that you continue on your current medications as directed. Please refer to the Current Medication list given to you today.   Labwork: none  Testing/Procedures: none  Follow-Up:  Your physician recommends that you schedule a follow-up appointment in: 1 month  Any Other Special Instructions Will Be Listed Below (If Applicable).  Please proceed to [facility] ER for further evaluation.  If you need a refill on your cardiac medications before your next appointment, please call your pharmacy.

## 2022-09-23 NOTE — ED Provider Triage Note (Signed)
Emergency Medicine Provider Triage Evaluation Note  [name] [name] , a 62 y.o. female  was evaluated in triage.  Pt complains of chest pain.  Seen by cardiologist earlier this morning and sent here for x-ray to rule out pneumonia.  Notes cough for several days.  No fever.  Also, patient is complaining of chest wall pain which she believes is from excessive coughing.  Has history of COPD, uses oxygen as needed.  Admits to intermittent syncopal episodes, she has had 2 syncopal episodes since her previous cardiology visit.  She has been been wearing a Holter monitor and has been scheduled for TTE and carotid Doppler for workup of her syncope.   Review of Systems  Positive: Chest wall pain, cough, intermittent syncopal episodes Negative: Shortness of breath, fever, chills, peripheral edema  Physical Exam  BP (!) 141/112 (BP Location: Right Arm)   Pulse 99   Temp 98.3 F (36.8 C) (Oral)   Resp 18   Ht 5\' 4"  (1.626 m)   Wt 52.6 kg   SpO2 100%   BMI 19.91 kg/m  Gen:   Awake, no distress   Resp:  Slight increased work of breathing.  Lung sounds diminished bilaterally. MSK:   Moves extremities without difficulty, no peripheral edema Other:    Medical Decision Making  Medically screening exam initiated at 1:30 PM.  Appropriate orders placed.  [name] [name] was informed that the remainder of the evaluation will be completed by another provider, this initial triage assessment does not replace that evaluation, and the importance of remaining in the ED until their evaluation is complete.     Kem Parkinson, PA-C 09/23/22 1344

## 2022-09-23 NOTE — ED Triage Notes (Signed)
Pt states her cardiologist sent here for xray to r/o PNA. Pt with cough noted in triage. Denies any fevers today. Denies any SOB. Home O2 PRN.

## 2022-09-23 NOTE — ED Notes (Signed)
Pt ambulated in the hallway and able to maintain conversation and O2 remained 98 to 99%.

## 2022-09-23 NOTE — Progress Notes (Addendum)
Cardiology Office Note:    Date:  09/23/2022  ID:  [name] [name], DOB 04-07-1961, MRN [id number]  PCP:  [name] [name], [place] [facility] Cardiologist:  [name] Dolly, MD     Referring MD: [name] Bal, PA-C   CC: Here for follow-up  History of Present Illness:    [name] [name] is a very pleasant 62 y.o. female with a hx of the following:   CAD Hyperlipidemia History of CVA History of PE COPD  Previous cardiovascular history includes history of prior stent in 2001.  Cardiac catheterization in 2010 showed patent coronary arteries and stent.  Cardiac catheterization in 2022 showed patent vessels and left circumflex stent.  Echocardiogram at that time revealed normal EF, no wall motion abnormalities, mild MR.   Last seen by Dr. Carlyle [name] on June 11, 2021.  Patient was doing well at the time.  Denied any chest pain or shortness of breath.  No medication changes were made. Was told to follow-up in 6 months.  Presented to [facility] in October 2023 for altered mental status.   EMS found patient unresponsive at home on the floor, pupils were constricted.  Patient was given Narcan by EMS to improve mental status.  Patient did not remember what happened.  EMS stated blood sugar was elevated.  She denied any pain.  She denied any drug use.  Blood pressure 84/58.  EKG showed normal sinus rhythm.  Our office was contacted for preoperative cardiovascular risk assessment.  Pending posterior cervical fusion with lateral mass fixation and instrumentation along C3-4, C4-5, and C5-6.  Surgery to be performed by Dr. Newman Pies on August 26, 2022.   When I saw her last follow-up, she admitted to 3 syncopal episodes, sounding vasovagal in nature.  She described 1 episode while going to the bathroom on the toilet and after standing up, fell down and hit her head after losing consciousness.  On Christmas Day 2023, she was making a meal, had diarrhea, and  later on lost consciousness.  Denied any acute injuries or cardiac symptoms surrounding events. Denied any stroke-like symptoms. Denied any chest pain, shortness of breath, palpitations, orthopnea, PND, swelling or significant weight changes, acute bleeding, or claudication.  Admitted to lightheadedness and dizziness. Cardiac monitor results below. Scheduled for TTE and carotid doppler for syncope work-up. Negative orthostatics.   Today she presents for follow-up.  Has not been doing well over the past week.  Says she has been sick.  Endorses fevers, with chills, and diaphoresis.  Did a COVID test at home that was negative.  She states she gets pneumonia about 4 times a year, she thinks this is either flu or pneumonia.  She does state she has had 2 recurrent syncopal episodes since last visit.  First episode happened while getting off the toilet, second episode happened while bending over to do laundry.  She went into the laundry basket, fell and gashed her right eyebrow area, did not require stitches.  Denies any anginal symptoms, but does admit to mid chest pain while coughing. Does admit that BP and HR have been elevated recently, attributes elevated BP due to chronic pain/musculoskeletal issues.  Does also have tremors from her chronic neurological/musculoskeletal conditions.  Denies any other associated symptoms.    Past Medical History:  Diagnosis Date   Acid reflux    Arthritis    CAD (coronary artery disease)    S/P cath with stent in 2001   CHF (congestive heart failure) ([place])  Collagen vascular disease ([name] [name])    COPD (chronic obstructive pulmonary disease) ([facility])    CVA (cerebral vascular accident) ([name] [name]) 1992   DU (duodenal ulcer) 09/19/2009   Hiatal hernia    EGD september 2007. normal except for small hiatal hernia. She underwent small bowel biopsy with history of diarrhea at that time. This was negative   History of colonoscopy    August 2007. She had a pedunculated polyp at 30 cm.  which was hamartomatous polyp. She  is due for 5-year followup with family history of coln cancer in a brother at age 16   History of colonoscopy    2005 revealed a linear ulcer with scar versus inflammatory process at the mid right colon, but normal terminal ileum. A biopsy of this area revealed an ulceration, but nonspecific   History of coronary angiogram    CT angiogram in 2007. negative with normal mesenteric arteries   History of kidney stones    Migraine    Pneumonia    pt states she gets pneumonia about twice a year   Pulmonary embolus ([name] [name]) 1999   Ulcerative colitis ([place])    Urinary frequency     Past Surgical History:  Procedure Laterality Date   ABDOMINAL HYSTERECTOMY  1985   ANTERIOR CERVICAL DECOMP/DISCECTOMY FUSION N/A 08/15/2021   Procedure: Cervical Three-Four, Cervical Four-Five, Cervical Five-Six  Anterior cervical decompression/discectomy/fusion/interbody prosthesis/plate/screws;  Surgeon: Newman Pies, MD;  Location: [place];  Service: Neurosurgery;  Laterality: N/A;  Cervical Three-Four, Cervical Four-Five, Cervical Five-Six  Anterior cervical decompression/discectomy/fusion/interbody prosthesis/plate/screws   APPENDECTOMY     CESAREAN SECTION  1979   CHOLECYSTECTOMY  2021   COLONOSCOPY  01/2006   [name] [name]-pedunculated polyp 30 cm hamartomatous polyp,   COLONOSCOPY  2005   Lineal ulcer or mid right colon, normal TI nonspecific biopsy   CORONARY ANGIOPLASTY WITH STENT PLACEMENT  2001   [facility], [place]   ESOPHAGOGASTRODUODENOSCOPY  09/19/2009   [name] [name] -2 duodenal bulbar ulcers, small HH otherwise normal/tiny distal esophageal erosions with mild erosive reflux   ESOPHAGOGASTRODUODENOSCOPY  03/2006   [name] [name]-Hiatal hernia, negative small bowel biopsy   LEFT HEART CATH AND CORONARY ANGIOGRAPHY N/A 04/18/2021   Procedure: LEFT HEART CATH AND CORONARY ANGIOGRAPHY;  Surgeon: [name] Osmond, MD;  Location: [place] [facility];  Service: Cardiovascular;  Laterality:  N/A;   SPINE SURGERY     TONSILLECTOMY  1986   TUBAL LIGATION     Unilateral oophorectomy with hysterectomy      Current Medications: Current Meds  Medication Sig   albuterol (PROVENTIL HFA;VENTOLIN HFA) 108 (90 BASE) MCG/ACT inhaler Inhale 2 puffs into the lungs every 6 (six) hours as needed for shortness of breath or wheezing.   albuterol (PROVENTIL) (2.5 MG/3ML) 0.083% nebulizer solution Take 3 mLs (2.5 mg total) by nebulization every 4 (four) hours as needed for wheezing or shortness of breath.   atorvastatin (LIPITOR) 40 MG tablet Take 1 tablet (40 mg total) by mouth daily.   gabapentin (NEURONTIN) 300 MG capsule Take 300 mg by mouth at bedtime.   ondansetron (ZOFRAN-ODT) 8 MG disintegrating tablet Take 8 mg by mouth every 6 (six) hours as needed for vomiting or nausea.   OXYGEN Inhale 4 L into the lungs continuous as needed (shortness of breath).   pantoprazole (PROTONIX) 40 MG tablet TAKE ONE TABLET BY MOUTH DAILY 30-60 MINUTES BEFORE FIRST MEAL OF THE DAY (Patient taking differently: Take 40 mg by mouth at bedtime.)   QUEtiapine (SEROQUEL) 100 MG tablet Take 100 mg  by mouth at bedtime.   traMADol (ULTRAM) 50 MG tablet Take 50-100 mg by mouth every 6 (six) hours as needed for moderate pain.     Allergies:   Codeine, Hydrocodone, Sulfa antibiotics, and Hydrocodone-acetaminophen   Social History   Socioeconomic History   Marital status: Married    Spouse name: Not on file   Number of children: 2   Years of education: Not on file   Highest education level: Not on file  Occupational History   Occupation: [employer]  Tobacco Use   Smoking status: Every Day    Packs/day: 0.50    Years: 35.00    Additional pack years: 0.00    Total pack years: 17.50    Types: Cigarettes    Passive exposure: Current   Smokeless tobacco: Never  Vaping Use   Vaping Use: Never used  Substance and Sexual Activity   Alcohol use: No   Drug use: No   Sexual activity: Not  Currently  Other Topics Concern   Not on file  Social History Narrative   Not on file   Social Determinants of Health   Financial Resource Strain: Not on file  Food Insecurity: Not on file  Transportation Needs: Not on file  Physical Activity: Not on file  Stress: Not on file  Social Connections: Not on file     Family History: The patient's family history includes Colon cancer (age of onset: 41) in her brother; Diabetes in her sister; Heart attack (age of onset: 16) in her mother.  ROS:   Review of Systems  Constitutional: Negative.   HENT: Negative.    Eyes: Negative.   Respiratory: Negative.    Cardiovascular: Negative.   Gastrointestinal: Negative.   Genitourinary: Negative.   Musculoskeletal:  Positive for falls. Negative for back pain, joint pain, myalgias and neck pain.       See HPI.  Skin: Negative.   Neurological:  Positive for dizziness and loss of consciousness. Negative for tingling, tremors, sensory change, speech change, focal weakness, seizures, weakness and headaches.       See HPI.  Endo/Heme/Allergies: Negative.   Psychiatric/Behavioral: Negative.      Please see the history of present illness.    All other systems reviewed and are negative.  EKGs/Labs/Other Studies Reviewed:    The following studies were reviewed today:   EKG:  EKG is not ordered today.    Cardiac monitor (preliminary report) 09/23/2022:   14 day monitor   Rare supraventricular ectopy in the form of isolated PACs, couplets, triplets. 17 runs of SVT longest 17 beats   Rare ventricular ectopy in the form of isolated PVCs, couplets. Isolated 5 beat run of NSVT   Reported symptoms correlated with normal sinus rhythm     Patch Wear Time:  14 days and 0 hours (2024-01-29T10:20:21-0500 to 2024-02-12T10:20:21-0500)   Patient had a min HR of 58 bpm, max HR of 185 bpm, and avg HR of 90 bpm. Predominant underlying rhythm was Sinus Rhythm. 1 run of Ventricular Tachycardia occurred  lasting 5 beats with a max rate of 179 bpm (avg 161 bpm). 17 Supraventricular Tachycardia  runs occurred, the run with the fastest interval lasting 10 beats with a max rate of 185 bpm, the longest lasting 17 beats with an avg rate of 94 bpm. Isolated SVEs were rare (<1.0%), SVE Couplets were rare (<1.0%), and SVE Triplets were rare (<1.0%).  Isolated VEs were rare (<1.0%), VE Couplets were rare (<1.0%), and no VE  Triplets were present. Ventricular Trigeminy was present.   Left heart cath on April 18, 2021:   Prox RCA lesion is 20% stenosed.   Mid Cx lesion is 10% stenosed.   LV end diastolic pressure is normal.   1.  Widely patent mid left circumflex stent with mild disease elsewhere. 2.  Normal LV function with LVEDP of 17 mmHg.  Echocardiogram on April 17, 2021: 1. Left ventricular ejection fraction, by estimation, is 65 to 70%. The  left ventricle has normal function. The left ventricle has no regional  wall motion abnormalities. Left ventricular diastolic parameters are  indeterminate.   2. Right ventricular systolic function is normal. The right ventricular  size is normal. There is normal pulmonary artery systolic pressure. The  estimated right ventricular systolic pressure is [id number] mmHg.   3. The mitral valve is grossly normal. Mild mitral valve regurgitation.   4. The aortic valve is tricuspid. Aortic valve regurgitation is not  visualized.   5. The inferior vena cava is normal in size with <50% respiratory  variability, suggesting right atrial pressure of 8 mmHg.   Comparison(s): No prior Echocardiogram.  Recent Labs: 10/02/2021: B Natriuretic Peptide 150.0 02/14/2022: ALT 13; BUN 9; Creatinine, Ser 0.71; Hemoglobin 11.4; Platelets 293; Potassium 3.5; Sodium 140  Recent Lipid Panel    Component Value Date/Time   CHOL 106 04/17/2021 0702   CHOL 105 04/17/2021 0702   TRIG 97 04/17/2021 0702   TRIG 99 04/17/2021 0702   HDL 46 04/17/2021 0702   HDL 45 04/17/2021 0702    CHOLHDL 2.3 04/17/2021 0702   CHOLHDL 2.3 04/17/2021 0702   VLDL 19 04/17/2021 0702   VLDL 20 04/17/2021 0702   LDLCALC 41 04/17/2021 0702   LDLCALC 40 04/17/2021 0702    Physical Exam:    VS:  BP (!) 146/102 (BP Location: Right Arm, Patient Position: Sitting, Cuff Size: Normal)   Pulse (!) 112   Ht 5\' 4"  (1.626 m)   Wt 116 lb 9.6 oz (52.9 kg)   SpO2 97%   BMI 20.01 kg/m     Wt Readings from Last 3 Encounters:  09/23/22 116 lb 9.6 oz (52.9 kg)  08/22/22 116 lb 6.4 oz (52.8 kg)  07/29/22 118 lb 3.2 oz (53.6 kg)     GEN: Thin, 62 y.o. female, appears ill HEENT: Normal NECK: No JVD; No carotid bruits CARDIAC: S1/S2, RRR, no murmurs, rubs, gallops; 2+ pulses RESPIRATORY:  Diminished to auscultation with scattered rhonchi, congested cough MUSCULOSKELETAL:  No edema; No deformity  SKIN: Warm and dry NEUROLOGIC:  Alert and oriented x 3 PSYCHIATRIC:  Normal affect   ASSESSMENT:    1. Acute exacerbation of chronic obstructive pulmonary disease (COPD) ([place])   2. Rhonchi   3. Tachycardia   4. Essential hypertension, benign   5. Coronary artery disease involving native heart without angina pectoris, unspecified vessel or lesion type   6. Hyperlipidemia, unspecified hyperlipidemia type     PLAN:    In order of problems listed above:  1.  Acute exacerbation of COPD/ rhonchi 2. Tachycardia, HTN 3. CAD 4. HLD  Patient is a 62 year old female with past medical history as mentioned above.  She presents her office today for regular follow-up.  She states she is not feeling well.  Believes it is either pneumonia/flu-see HPI.  Physical exam as mentioned above.  Slightly tachycardic and elevated blood pressure on exam.  Case discussed with (DOD), Dr. Dellia Cloud who recommended ED evaluation, to which I am in  agreement with.  Discussed/recommended this with patient, she is agreeable to present to [facility] today for evaluation.  Offered EMS transportation, however patient declined.  Discussed risks of driving with her history and she verbalized understanding. Case discussed and report given to Forestine Na, ED on-call, Dr. Godfrey Pick, who verbalized understanding of report.  Upon arrival to the emergency department, I recommend the following be done/obtained: Twelve-lead EKG, cardiac telemetry monitoring, close observation of vital signs Labs including the following: CBC with differential, blood cultures, CMET, magnesium, thyroid panel, trops, D-dimer, lactic acid, and respiratory viral panel. Imaging including 2 view chest x-ray, CT scan of chest/head/neck, update echo if necessary-has 1 scheduled for October 14, 2022. Patient will require nebulizer treatments, IV Solu-Medrol, and may require medication to lower HR, appears she is currently not on beta-blocker at this time due to history of COPD. IV ABX if needed for PNA. Consult Cardiology PRN. Discharge when vital signs have improved and condition resolves. Follow-up with outpatient cardiology in 3-4 weeks after hospital discharge.   Medication Adjustments/Labs and Tests Ordered: Current medicines are reviewed at length with the patient today.  Concerns regarding medicines are outlined above.  No orders of the defined types were placed in this encounter.  No orders of the defined types were placed in this encounter.   Patient Instructions  Medication Instructions:  Your physician recommends that you continue on your current medications as directed. Please refer to the Current Medication list given to you today.   Labwork: none  Testing/Procedures: none  Follow-Up:  Your physician recommends that you schedule a follow-up appointment in: 1 month  Any Other Special Instructions Will Be Listed Below (If Applicable).  Please proceed to [facility] ER for further evaluation.  If you need a refill on your cardiac medications before your next appointment, please call your pharmacy.    SignedFinis Bud, NP   09/23/2022 12:44 PM    [place]

## 2022-09-23 NOTE — Discharge Instructions (Signed)
Continue to use your inhalers as directed.  Start the prednisone prescription tomorrow.  Use your oxygen as needed.  You may apply over-the-counter 4% lidocaine patches to your chest to help with pain.  This area should heal on its own.  Please keep your scheduled appointment with your cardiologist for the further workup of your syncopal episodes.  Please return to the emergency department for any new or worsening symptoms.

## 2022-09-27 NOTE — ED Provider Notes (Signed)
[facility] Provider Note   CSN: [id number] Arrival date & time: 09/23/22  1229     History  Chief Complaint  Patient presents with   Chest Pain    MOANI RAEL is a 62 y.o. female.   Chest Pain Associated symptoms: cough   Associated symptoms: no abdominal pain, no dizziness, no fever, no headache, no nausea, no shortness of breath, no vomiting and no weakness        ELIYANNA FOLAN is a 62 y.o. female with past medical history significant for coronary artery disease, previous PE, stroke, ulcerative colitis, CHF, and COPD who presents to the Emergency Department states she was sent here under the advisement of her cardiologist.  States she was sent here to rule out possible pneumonia.  She reports persistent cough for several days.  Notes wheezing despite using her breathing treatments.  She does complain of chest pain that she believes is from excessive coughing.  She also notes occasional syncopal episodes have been going on for several weeks to months.  States that her cardiologist is aware of this and she is scheduled for TTE and carotid Doppler to further evaluate her syncopal episodes.  She denies any episodes in the last few days.  She has home oxygen that she uses on a as needed basis.  She denies any fever, chills, abdominal pain,  does not feel that she is short of breath at this time.  Home Medications Prior to Admission medications   Medication Sig Start Date End Date Taking? Authorizing Provider  predniSONE (DELTASONE) 20 MG tablet Take 2 tablets (40 mg total) by mouth daily. 09/23/22  Yes Lizett Chowning, PA-C  albuterol (PROVENTIL HFA;VENTOLIN HFA) 108 (90 BASE) MCG/ACT inhaler Inhale 2 puffs into the lungs every 6 (six) hours as needed for shortness of breath or wheezing.    [provider]  albuterol (PROVENTIL) (2.5 MG/3ML) 0.083% nebulizer solution Take 3 mLs (2.5 mg total) by nebulization every 4 (four) hours  as needed for wheezing or shortness of breath. 07/10/21   Tanda Rockers, MD  atorvastatin (LIPITOR) 40 MG tablet Take 1 tablet (40 mg total) by mouth daily. 04/19/21 07/29/88  Terrilee Croak, MD  budesonide-formoterol (SYMBICORT) 80-4.5 MCG/ACT inhaler Take 2 puffs first thing in am and then another 2 puffs about 12 hours later. Patient not taking: Reported on 08/21/2022 07/10/21   Tanda Rockers, MD  gabapentin (NEURONTIN) 300 MG capsule Take 300 mg by mouth at bedtime. 10/12/20   [provider]  guaiFENesin (MUCINEX) 600 MG 12 hr tablet Take 1 tablet (600 mg total) by mouth 2 (two) times daily. Patient not taking: Reported on 08/21/2022 04/11/14   Kathie Dike, MD  ondansetron (ZOFRAN-ODT) 8 MG disintegrating tablet Take 8 mg by mouth every 6 (six) hours as needed for vomiting or nausea. 06/26/21   [provider]  OXYGEN Inhale 4 L into the lungs continuous as needed (shortness of breath).    [provider]  pantoprazole (PROTONIX) 40 MG tablet TAKE ONE TABLET BY MOUTH DAILY 30-60 MINUTES BEFORE FIRST MEAL OF THE DAY Patient taking differently: Take 40 mg by mouth at bedtime. 02/15/22   Tanda Rockers, MD  QUEtiapine (SEROQUEL) 100 MG tablet Take 100 mg by mouth at bedtime. 11/06/20   [provider]  traMADol (ULTRAM) 50 MG tablet Take 50-100 mg by mouth every 6 (six) hours as needed for moderate pain. 07/08/22   [provider]  Allergies    Codeine, Hydrocodone, Sulfa antibiotics, and Hydrocodone-acetaminophen    Review of Systems   Review of Systems  Constitutional:  Negative for chills and fever.  HENT:  Negative for congestion.   Respiratory:  Positive for cough. Negative for shortness of breath.   Cardiovascular:  Positive for chest pain.  Gastrointestinal:  Negative for abdominal pain, nausea and vomiting.  Genitourinary:  Negative for difficulty urinating and dysuria.  Skin:  Negative for wound.  Neurological:  Positive for syncope.  Negative for dizziness, weakness and headaches.    Physical Exam Updated Vital Signs BP (!) 134/96 (BP Location: Left Arm)   Pulse 98   Temp 98 F (36.7 C) (Oral)   Resp 16   Ht 5\' 4"  (1.626 m)   Wt 52.6 kg   SpO2 100%   BMI 19.91 kg/m  Physical Exam Vitals and nursing note reviewed.  Constitutional:      General: She is not in acute distress.    Appearance: Normal appearance. She is well-developed. She is not ill-appearing, toxic-appearing or diaphoretic.  HENT:     Mouth/Throat:     Mouth: Mucous membranes are moist.  Cardiovascular:     Rate and Rhythm: Normal rate and regular rhythm.     Pulses: Normal pulses.  Pulmonary:     Effort: Pulmonary effort is normal.     Breath sounds: No stridor. Wheezing present. No rales.     Comments: Tender to palpation central chest.  No bony deformity or crepitus. Chest:     Chest wall: Tenderness present.  Abdominal:     Palpations: Abdomen is soft.     Tenderness: There is no abdominal tenderness.  Musculoskeletal:     Right lower leg: No edema.     Left lower leg: No edema.  Skin:    General: Skin is warm.  Neurological:     General: No focal deficit present.     Mental Status: She is alert.     Sensory: No sensory deficit.     Motor: No weakness.     ED Results / Procedures / Treatments   Labs (all labs ordered are listed, but only abnormal results are displayed) Labs Reviewed  COMPREHENSIVE METABOLIC PANEL - Abnormal; Notable for the following components:      Result Value   Glucose, Bld 100 (*)    All other components within normal limits  D-DIMER, QUANTITATIVE - Abnormal; Notable for the following components:   D-Dimer, Quant 0.76 (*)    All other components within normal limits  RESP PANEL BY RT-PCR (RSV, FLU A&B, COVID)  [id number]  CULTURE, BLOOD (ROUTINE X 2)  CULTURE, BLOOD (ROUTINE X 2)  CBC WITH DIFFERENTIAL/PLATELET  MAGNESIUM  TSH  LACTIC ACID, PLASMA  TROPONIN I (HIGH SENSITIVITY)  TROPONIN I (HIGH  SENSITIVITY)    EKG EKG Interpretation  Date/Time:  Monday September 23 2022 12:51:56 EDT Ventricular Rate:  104 PR Interval:  146 QRS Duration: 86 QT Interval:  338 QTC Calculation: 444 R Axis:   79 Text Interpretation: Sinus tachycardia Right atrial enlargement Nonspecific ST abnormality Abnormal ECG When compared with ECG of 14-Feb-2022 02:45, PREVIOUS ECG IS PRESENT Confirmed by Margaretmary Eddy [phone]) on 09/23/2022 7:11:13 PM  Radiology CT Angio Chest PE W and/or Wo Contrast  Result Date: 09/23/2022 CLINICAL DATA:  Positive D-dimer. Chest pain. Cough for several days. EXAM: CT ANGIOGRAPHY CHEST WITH CONTRAST TECHNIQUE: Multidetector CT imaging of the chest was performed using the standard protocol during bolus administration of  intravenous contrast. Multiplanar CT image reconstructions and MIPs were obtained to evaluate the vascular anatomy. RADIATION DOSE REDUCTION: This exam was performed according to the departmental dose-optimization program which includes automated exposure control, adjustment of the mA and/or kV according to patient size and/or use of iterative reconstruction technique. CONTRAST:  78mL OMNIPAQUE IOHEXOL 350 MG/ML SOLN COMPARISON:  CT angiogram 10/02/2021. Chest x-ray 09/23/2022 earlier FINDINGS: Cardiovascular: No pulmonary embolism identified. Heart is nonenlarged. Tiny pericardial effusion. Coronary artery calcifications are seen. Please correlate for other coronary risk factors. The thoracic aorta has a normal course and caliber with scattered partially calcified atherosclerotic plaque. Mediastinum/Nodes: No specific abnormal lymph node enlargement identified in the axillary region, hilum or mediastinum. Slightly patulous thoracic esophagus. Lungs/Pleura: Apical pleural thickening identified with some calcifications. Centrilobular emphysematous changes of the apices. Some mild lung base areas of subtle ground-glass and atelectasis. No consolidation, pneumothorax or  effusion. Upper Abdomen: Adrenal glands are grossly preserved. Musculoskeletal: Fixation hardware seen along the lower cervical spine at the edge of the imaging field. Slight curvature of the thoracic spine with mild degenerative changes. Old sternal fracture identified with some callus. This was not seen on the study of April 2023. Review of the MIP images confirms the above findings. IMPRESSION: No pulmonary embolism identified. Emphysematous lung changes. Trace pericardial fluid. Coronary artery calcifications are seen. Please correlate for other coronary risk factors. Transverse sternal fracture. This was not clearly seen on the study of April 2023. Please correlate with clinical history Aortic Atherosclerosis (ICD10-I70.0) and Emphysema (ICD10-J43.9). Electronically Signed   By: Jill Side M.D.   On: 09/23/2022 17:33   DG Chest 2 View  Result Date: 09/23/2022 CLINICAL DATA:  Pneumonia + Cough EXAM: CHEST - 2 VIEW COMPARISON:  CXR 05/15/22 FINDINGS: Pulmonary hyperinflation. Apical predominant emphysema. The heart size and mediastinal contours are within normal limits. No focal airspace opacity. The visualized skeletal structures are unremarkable. IMPRESSION: Pulmonary hyperinflation and apical predominant emphysema. No focal airspace opacity. Electronically Signed   By: Marin Roberts M.D.   On: 09/23/2022 13:39     Procedures Procedures    Medications Ordered in ED Medications  ipratropium-albuterol (DUONEB) 0.5-2.5 (3) MG/3ML nebulizer solution 3 mL (3 mLs Nebulization Given 09/23/22 1739)  predniSONE (DELTASONE) tablet 60 mg (60 mg Oral Given 09/23/22 1810)  iohexol (OMNIPAQUE) 350 MG/ML injection 75 mL (75 mLs Intravenous Contrast Given 09/23/22 1711)    ED Course/ Medical Decision Making/ A&P                             Medical Decision Making Patient sent here under recommendation of cardiologist.  Cardiologist concerned of possible pneumonia.  Recommended septic workup along with  chest x-ray.  Patient does not appear septic on arrival.  She does have some expiratory wheezes and rhonchi on lung exam.  No hypoxia tachycardia or tachypnea.  No increased work of breathing on my exam.  I do not appreciate any peripheral edema.  Sepsis would be included on diff dx as well as PNA, bronchitis, PE, ACS, exacerbation of her COPD  Amount and/or Complexity of Data Reviewed Labs: ordered.    Details: Labs w/o leukocytosis, respir panel neg, chemistries unremarkable, lactic, magnesium and trop all reassuring.  D dimer mildly elevated  Radiology: ordered.    Details: CXR w/o PNA  CT angio chest w/o acute PE.  Transverse sternal fx noted ECG/medicine tests: ordered.    Details: Sinus tach, non specific ST wave deformity  Discussion of management or test interpretation with external provider(s): Work up w/o sepsis, PE, PNA or ACS.  Chest pain felt to be related to sternal fx seen on CT imaging.  Pt reports hx of falls secondary to syncope and the fx maybe result of one of her falls.  Fx felt to heal w/o intervention. SHe is being evaluated by her cardiologist for her syncope which has been intermittent and present for weeks to months.    Pt lungs sounds improved after neb here.  Ambulated in the dept on RA w/o difficulty or hypoxia Will tx with steroid, she will continue her inhalers and close f/u with cards.  Return precautions discussed  Risk Prescription drug management.           Final Clinical Impression(s) / ED Diagnoses Final diagnoses:  COPD exacerbation ([place])  Closed fracture of sternum, unspecified portion of sternum, initial encounter    Rx / DC Orders ED Discharge Orders          Ordered    predniSONE (DELTASONE) 20 MG tablet  Daily        09/23/22 1916              Kem Parkinson, PA-C 09/27/22 1539    Godfrey Pick, MD 10/02/22 1601

## 2022-09-28 LAB — CULTURE, BLOOD (ROUTINE X 2)
Culture: NO GROWTH
Culture: NO GROWTH
Special Requests: ADEQUATE
Special Requests: ADEQUATE

## 2022-10-14 ENCOUNTER — Other Ambulatory Visit: Payer: 59

## 2022-10-16 DIAGNOSIS — M549 Dorsalgia, unspecified: Secondary | ICD-10-CM | POA: Diagnosis not present

## 2022-10-16 DIAGNOSIS — G8918 Other acute postprocedural pain: Secondary | ICD-10-CM | POA: Diagnosis not present

## 2022-10-16 DIAGNOSIS — I2510 Atherosclerotic heart disease of native coronary artery without angina pectoris: Secondary | ICD-10-CM | POA: Diagnosis not present

## 2022-10-22 DIAGNOSIS — J441 Chronic obstructive pulmonary disease with (acute) exacerbation: Secondary | ICD-10-CM | POA: Diagnosis not present

## 2022-10-22 DIAGNOSIS — I2510 Atherosclerotic heart disease of native coronary artery without angina pectoris: Secondary | ICD-10-CM | POA: Diagnosis not present

## 2022-10-22 DIAGNOSIS — G8918 Other acute postprocedural pain: Secondary | ICD-10-CM | POA: Diagnosis not present

## 2022-10-22 DIAGNOSIS — R0902 Hypoxemia: Secondary | ICD-10-CM | POA: Diagnosis not present

## 2022-10-22 DIAGNOSIS — M549 Dorsalgia, unspecified: Secondary | ICD-10-CM | POA: Diagnosis not present

## 2022-10-24 ENCOUNTER — Ambulatory Visit: Payer: 59 | Attending: Nurse Practitioner | Admitting: Nurse Practitioner

## 2022-10-24 ENCOUNTER — Encounter: Payer: Self-pay | Admitting: Nurse Practitioner

## 2022-10-24 VITALS — BP 140/90 | HR 81 | Ht 64.0 in | Wt 120.4 lb

## 2022-10-24 DIAGNOSIS — E785 Hyperlipidemia, unspecified: Secondary | ICD-10-CM

## 2022-10-24 DIAGNOSIS — I2510 Atherosclerotic heart disease of native coronary artery without angina pectoris: Secondary | ICD-10-CM | POA: Diagnosis not present

## 2022-10-24 DIAGNOSIS — I10 Essential (primary) hypertension: Secondary | ICD-10-CM | POA: Diagnosis not present

## 2022-10-24 DIAGNOSIS — Z87898 Personal history of other specified conditions: Secondary | ICD-10-CM

## 2022-10-24 DIAGNOSIS — J449 Chronic obstructive pulmonary disease, unspecified: Secondary | ICD-10-CM

## 2022-10-24 DIAGNOSIS — Z8673 Personal history of transient ischemic attack (TIA), and cerebral infarction without residual deficits: Secondary | ICD-10-CM | POA: Diagnosis not present

## 2022-10-24 DIAGNOSIS — Z79899 Other long term (current) drug therapy: Secondary | ICD-10-CM | POA: Diagnosis not present

## 2022-10-24 MED ORDER — LOSARTAN POTASSIUM 25 MG PO TABS: 25.0000 mg | ORAL_TABLET | Freq: Every day | ORAL | 1 refills | Status: DC

## 2022-10-24 NOTE — Patient Instructions (Addendum)
Medication Instructions:  Your physician has recommended you make the following change in your medication:  START losartan 25 mg once a day Continue all other medications as directed  Labwork: BMET due in 2 weeks @ [facility]  Testing/Procedures: none  Follow-Up:  Your physician recommends that you schedule a follow-up appointment in: 6 weeks  Any Other Special Instructions Will Be Listed Below (If Applicable).  Please monitor your blood pressure and bring blood pressure log (included in this packet) with you to your next appointment.  If you need a refill on your cardiac medications before your next appointment, please call your pharmacy.

## 2022-10-24 NOTE — Progress Notes (Signed)
Cardiology Office Note:    Date:  10/24/2022  ID:  CARMINE YOUNGBERG, DOB 07/19/1960, MRN [id number]  PCP:  Lianne Moris, PA-C   [facility] Providers Cardiologist:  Dina Rich, MD     Referring MD: Lianne Moris, PA-C   CC: Here for follow-up  History of Present Illness:    [name] [name] is a very pleasant 62 y.o. female with a hx of the following:   CAD Hyperlipidemia History of CVA History of PE COPD  Previous cardiovascular history includes history of prior stent in 2001.  Cardiac catheterization in 2010 showed patent coronary arteries and stent.  Cardiac catheterization in 2022 showed patent vessels and left circumflex stent.  Echocardiogram at that time revealed normal EF, no wall motion abnormalities, mild MR.   Last seen by Dr. Dina Rich on June 11, 2021.  Patient was doing well at the time.  Denied any chest pain or shortness of breath.  No medication changes were made. Was told to follow-up in 6 months.  Presented to [facility] in October 2023 for altered mental status.   EMS found patient unresponsive at home on the floor, pupils were constricted.  Patient was given Narcan by EMS to improve mental status.  Patient did not remember what happened.  EMS stated blood sugar was elevated.  She denied any pain.  She denied any drug use.  Blood pressure 84/58.  EKG showed normal sinus rhythm.  Our office was contacted for preoperative cardiovascular risk assessment.  Pending posterior cervical fusion with lateral mass fixation and instrumentation along C3-4, C4-5, and C5-6.  Surgery to be performed by Dr. Tressie Stalker on August 26, 2022.   07/29/2022 -  Admitted to 3 syncopal episodes, sounding vasovagal in nature.  She described 1 episode while going to the bathroom on the toilet and after standing up, fell down and hit her head after losing consciousness.  On Christmas Day 2023, she was making a meal, had diarrhea, and later on lost  consciousness.  Denied any acute injuries or cardiac symptoms surrounding events. Denied any stroke-like symptoms. Denied any chest pain, shortness of breath, palpitations, orthopnea, PND, swelling or significant weight changes, acute bleeding, or claudication.  Admitted to lightheadedness and dizziness. Cardiac monitor results below. Scheduled for TTE and carotid doppler for syncope work-up. Negative orthostatics.   09/23/2022 - Had not been doing well over the past week and reported being ill. Endorsed fevers, with chills, and diaphoresis.  Did a COVID test at home that was negative, thought this is either flu or pneumonia. Noted 2 recurrent syncopal episodes since last visit.  First episode happened while getting off the toilet, second episode happened while bending over to do laundry.  She went into the laundry basket, fell and gashed her right eyebrow area, did not require stitches.  Denied any anginal symptoms, but admitted to mid chest pain while coughing. Does admit that BP and HR have been elevated recently, attributed elevated BP due to chronic pain/musculoskeletal issues.  Noted tremors from her chronic neurological/musculoskeletal conditions.  Encouraged her to present to the ED based on symptoms.  CTA of chest and chest x-ray were negative for anything acute.  No PE identified despite elevated D-dimer.  She was sinus tachycardia on EKG, nonspecific ST wave deformity.  Her lungs sounds improved after nebulizer.  Was treated with steroid.  10/24/2022 - Doing better since last visit. Continues to note nasal congestion/URI symptoms.  Denies any chest pain or syncope.  Occasionally has dizziness  while bending over.  Chief concern today is her elevated blood pressure.  It is elevated at home.  Has never been on BP medicine before.  Denies any other associated symptoms or concerns. Denies any chest pain, shortness of breath, palpitations, syncope, presyncope, orthopnea, PND, swelling or significant weight  changes, acute bleeding, or claudication.  Past Medical History:  Diagnosis Date   Acid reflux    Arthritis    CAD (coronary artery disease)    S/P cath with stent in 2001   CHF (congestive heart failure)    Collagen vascular disease    COPD (chronic obstructive pulmonary disease)    CVA (cerebral vascular accident) 1992   DU (duodenal ulcer) 09/19/2009   Hiatal hernia    EGD september 2007. normal except for small hiatal hernia. She underwent small bowel biopsy with history of diarrhea at that time. This was negative   History of colonoscopy    August 2007. She had a pedunculated polyp at 30 cm. which was hamartomatous polyp. She  is due for 5-year followup with family history of coln cancer in a brother at age 81   History of colonoscopy    2005 revealed a linear ulcer with scar versus inflammatory process at the mid right colon, but normal terminal ileum. A biopsy of this area revealed an ulceration, but nonspecific   History of coronary angiogram    CT angiogram in 2007. negative with normal mesenteric arteries   History of kidney stones    Migraine    Pneumonia    pt states she gets pneumonia about twice a year   Pulmonary embolus 1999   Ulcerative colitis    Urinary frequency     Past Surgical History:  Procedure Laterality Date   ABDOMINAL HYSTERECTOMY  1985   ANTERIOR CERVICAL DECOMP/DISCECTOMY FUSION N/A 08/15/2021   Procedure: Cervical Three-Four, Cervical Four-Five, Cervical Five-Six  Anterior cervical decompression/discectomy/fusion/interbody prosthesis/plate/screws;  Surgeon: Tressie Stalker, MD;  Location: [facility] OR;  Service: Neurosurgery;  Laterality: N/A;  Cervical Three-Four, Cervical Four-Five, Cervical Five-Six  Anterior cervical decompression/discectomy/fusion/interbody prosthesis/plate/screws   APPENDECTOMY     CESAREAN SECTION  1979   CHOLECYSTECTOMY  2021   COLONOSCOPY  01/2006   Rourk-pedunculated polyp 30 cm hamartomatous polyp,   COLONOSCOPY  2005    Lineal ulcer or mid right colon, normal TI nonspecific biopsy   CORONARY ANGIOPLASTY WITH STENT PLACEMENT  2001   [facility], [place]   ESOPHAGOGASTRODUODENOSCOPY  09/19/2009   Rourk -2 duodenal bulbar ulcers, small HH otherwise normal/tiny distal esophageal erosions with mild erosive reflux   ESOPHAGOGASTRODUODENOSCOPY  03/2006   Rourk-Hiatal hernia, negative small bowel biopsy   LEFT HEART CATH AND CORONARY ANGIOGRAPHY N/A 04/18/2021   Procedure: LEFT HEART CATH AND CORONARY ANGIOGRAPHY;  Surgeon: Orbie Pyo, MD;  Location: [facility] INVASIVE CV LAB;  Service: Cardiovascular;  Laterality: N/A;   SPINE SURGERY     TONSILLECTOMY  1986   TUBAL LIGATION     Unilateral oophorectomy with hysterectomy      Current Medications: Current Meds  Medication Sig   albuterol (PROVENTIL HFA;VENTOLIN HFA) 108 (90 BASE) MCG/ACT inhaler Inhale 2 puffs into the lungs every 6 (six) hours as needed for shortness of breath or wheezing.   albuterol (PROVENTIL) (2.5 MG/3ML) 0.083% nebulizer solution Take 3 mLs (2.5 mg total) by nebulization every 4 (four) hours as needed for wheezing or shortness of breath.   atorvastatin (LIPITOR) 40 MG tablet Take 1 tablet (40 mg total) by mouth daily.   gabapentin (  NEURONTIN) 300 MG capsule Take 300 mg by mouth at bedtime.   ondansetron (ZOFRAN-ODT) 8 MG disintegrating tablet Take 8 mg by mouth every 6 (six) hours as needed for vomiting or nausea.   OXYGEN Inhale 4 L into the lungs continuous as needed (shortness of breath).   pantoprazole (PROTONIX) 40 MG tablet TAKE ONE TABLET BY MOUTH DAILY 30-60 MINUTES BEFORE FIRST MEAL OF THE DAY (Patient taking differently: Take 40 mg by mouth at bedtime.)   predniSONE (DELTASONE) 10 MG tablet Take 10 mg by mouth daily with breakfast.   QUEtiapine (SEROQUEL) 100 MG tablet Take 100 mg by mouth at bedtime.   traMADol (ULTRAM) 50 MG tablet Take 50-100 mg by mouth every 6 (six) hours as needed for moderate pain.     Allergies:    Codeine, Hydrocodone, Sulfa antibiotics, and Hydrocodone-acetaminophen   Social History   Socioeconomic History   Marital status: Married    Spouse name: Not on file   Number of children: 2   Years of education: Not on file   Highest education level: Not on file  Occupational History   Occupation: [employer]  Tobacco Use   Smoking status: Every Day    Packs/day: 0.50    Years: 35.00    Additional pack years: 0.00    Total pack years: 17.50    Types: Cigarettes    Passive exposure: Current   Smokeless tobacco: Never  Vaping Use   Vaping Use: Never used  Substance and Sexual Activity   Alcohol use: No   Drug use: No   Sexual activity: Not Currently  Other Topics Concern   Not on file  Social History Narrative   Not on file   Social Determinants of Health   Financial Resource Strain: Not on file  Food Insecurity: Not on file  Transportation Needs: Not on file  Physical Activity: Not on file  Stress: Not on file  Social Connections: Not on file     Family History: The patient's family history includes Colon cancer (age of onset: 44) in her brother; Diabetes in her sister; Heart attack (age of onset: 67) in her mother.  ROS:     Please see the history of present illness.    All other systems reviewed and are negative.  EKGs/Labs/Other Studies Reviewed:    The following studies were reviewed today:   EKG:  EKG is not ordered today.    Cardiac monitor 09/23/2022:   14 day monitor   Rare supraventricular ectopy in the form of isolated PACs, couplets, triplets. 17 runs of SVT longest 17 beats   Rare ventricular ectopy in the form of isolated PVCs, couplets. Isolated 5 beat run of NSVT   Reported symptoms correlated with normal sinus rhythm     Patch Wear Time:  14 days and 0 hours (2024-01-29T10:20:21-0500 to 2024-02-12T10:20:21-0500)   Patient had a min HR of 58 bpm, max HR of 185 bpm, and avg HR of 90 bpm. Predominant underlying rhythm was  Sinus Rhythm. 1 run of Ventricular Tachycardia occurred lasting 5 beats with a max rate of 179 bpm (avg 161 bpm). 17 Supraventricular Tachycardia  runs occurred, the run with the fastest interval lasting 10 beats with a max rate of 185 bpm, the longest lasting 17 beats with an avg rate of 94 bpm. Isolated SVEs were rare (<1.0%), SVE Couplets were rare (<1.0%), and SVE Triplets were rare (<1.0%).  Isolated VEs were rare (<1.0%), VE Couplets were rare (<1.0%), and no VE  Triplets were present. Ventricular Trigeminy was present.   Left heart cath on April 18, 2021:   Prox RCA lesion is 20% stenosed.   Mid Cx lesion is 10% stenosed.   LV end diastolic pressure is normal.   1.  Widely patent mid left circumflex stent with mild disease elsewhere. 2.  Normal LV function with LVEDP of 17 mmHg.  Echocardiogram on April 17, 2021: 1. Left ventricular ejection fraction, by estimation, is 65 to 70%. The  left ventricle has normal function. The left ventricle has no regional  wall motion abnormalities. Left ventricular diastolic parameters are  indeterminate.   2. Right ventricular systolic function is normal. The right ventricular  size is normal. There is normal pulmonary artery systolic pressure. The  estimated right ventricular systolic pressure is 34.0 mmHg.   3. The mitral valve is grossly normal. Mild mitral valve regurgitation.   4. The aortic valve is tricuspid. Aortic valve regurgitation is not  visualized.   5. The inferior vena cava is normal in size with <50% respiratory  variability, suggesting right atrial pressure of 8 mmHg.   Comparison(s): No prior Echocardiogram.  Recent Labs: 09/23/2022: ALT 11; BUN 14; Creatinine, Ser 0.65; Hemoglobin 13.9; Magnesium 1.8; Platelets 288; Potassium 3.9; Sodium 135; TSH 1.379  Recent Lipid Panel    Component Value Date/Time   CHOL 106 04/17/2021 0702   CHOL 105 04/17/2021 0702   TRIG 97 04/17/2021 0702   TRIG 99 04/17/2021 0702   HDL 46  04/17/2021 0702   HDL 45 04/17/2021 0702   CHOLHDL 2.3 04/17/2021 0702   CHOLHDL 2.3 04/17/2021 0702   VLDL 19 04/17/2021 0702   VLDL 20 04/17/2021 0702   LDLCALC 41 04/17/2021 0702   LDLCALC 40 04/17/2021 0702    Physical Exam:    VS:  BP (!) 140/90 (BP Location: Left Arm, Patient Position: Sitting, Cuff Size: Normal)   Pulse 81   Ht 5\' 4"  (1.626 m)   Wt 120 lb 6.4 oz (54.6 kg)   SpO2 97%   BMI 20.67 kg/m     Wt Readings from Last 3 Encounters:  10/24/22 120 lb 6.4 oz (54.6 kg)  09/23/22 116 lb (52.6 kg)  09/23/22 116 lb 9.6 oz (52.9 kg)     GEN: Thin, 62 y.o. female HEENT: Normal NECK: No JVD; No carotid bruits CARDIAC: S1/S2, RRR, no murmurs, rubs, gallops; 2+ pulses RESPIRATORY:  Diminished to auscultation with scattered wheezing, congested cough, runny nose and evidence of nasal congestion MUSCULOSKELETAL:  No edema; No deformity  SKIN: Warm and dry NEUROLOGIC:  Alert and oriented x 3 PSYCHIATRIC:  Normal affect   ASSESSMENT:    1. History of syncope   2. Coronary artery disease involving native coronary artery of native heart without angina pectoris   3. Medication management   4. Essential hypertension, benign   5. Hyperlipidemia, unspecified hyperlipidemia type   6. History of CVA (cerebrovascular accident)   7. Chronic obstructive pulmonary disease, unspecified COPD type     PLAN:    1. Hx of syncope Denies any recurrent syncopal episodes since I last saw her. Etiology unclear, but does sound vasovagal in nature.  Negative orthostatics in past. Previously arranged echocardiogram, arrange carotid Dopplers. Recent monitor negative for any significant arrhythmias or sustained pauses.  Conservative measures discussed for dizziness and lightheadedness, including adequate hydration, wearing compression stockings, and changing positions slowly.  Discussed to avoid driving. She verbalizes understanding.  Highly suspect that several medications that she is taking may  be contributing to this including: tramadol, Seroquel, and hydroxyzine. Continue to follow with PCP.  Heart healthy diet and regular cardiovascular exercise encouraged. Will evaluate workup and see if preoperative cardiovascular risk assessment can be performed at next OV.     2. CAD Stable with no anginal symptoms. No indication for ischemic evaluation. Continue current medication regimen. Heart healthy diet and regular cardiovascular exercise encouraged.   3. HTN, medication management BP on arrival 152/104. Repeat BP 140/90. Currently not on any antihypertensives. Want to avoid BB with diagnosis of COPD and Ace-inhibitor d/t hx of cough. Will start Losartan 25 mg daily and recheck BMET in 2 weeks per protocol. Discussed to monitor BP at home at least 2 hours after medications and sitting for 5-10 minutes. Heart healthy diet and regular cardiovascular exercise encouraged.   4. HLD Most recent LDL 108. Continue atorvastatin. Not at goal. Not addressed at OV, but plan to discuss Lipid Clinic referral at next OV/Leqvio. Heart healthy diet and regular cardiovascular exercise encouraged.    5. History of CVA  Denies any stroke-like symptoms. Only syncopal episodes as mentioned above. Previously stopped on ASA and plavix stopped, managed by PCP. Continue to follow with PCP. ED precautions discussed.   6. COPD Current URI symptoms. Tx for COPD exacerbation in ED on 09/23/2022. Continue to follow with PCP. Plan to refer to pulmonology at next OV.   7. Disposition: Follow-up in 6 weeks with me or APP or sooner if anything changes.   Medication Adjustments/Labs and Tests Ordered: Current medicines are reviewed at length with the patient today.  Concerns regarding medicines are outlined above.  Orders Placed This Encounter  Procedures   Basic metabolic panel   Meds ordered this encounter  Medications   losartan (COZAAR) 25 MG tablet    Sig: Take 1 tablet (25 mg total) by mouth daily.    Dispense:   90 tablet    Refill:  1    10/24/22 New Start    Patient Instructions  Medication Instructions:  Your physician has recommended you make the following change in your medication:  START losartan 25 mg once a day Continue all other medications as directed  Labwork: BMET due in 2 weeks @ [facility]  Testing/Procedures: none  Follow-Up:  Your physician recommends that you schedule a follow-up appointment in: 6 weeks  Any Other Special Instructions Will Be Listed Below (If Applicable).  Please monitor your blood pressure and bring blood pressure log (included in this packet) with you to your next appointment.  If you need a refill on your cardiac medications before your next appointment, please call your pharmacy.     SignedSharlene Dory, NP  10/24/2022 11:39 AM    [facility]

## 2022-10-29 DIAGNOSIS — G8918 Other acute postprocedural pain: Secondary | ICD-10-CM | POA: Diagnosis not present

## 2022-10-29 DIAGNOSIS — M549 Dorsalgia, unspecified: Secondary | ICD-10-CM | POA: Diagnosis not present

## 2022-10-29 DIAGNOSIS — I2510 Atherosclerotic heart disease of native coronary artery without angina pectoris: Secondary | ICD-10-CM | POA: Diagnosis not present

## 2022-11-05 DIAGNOSIS — F32A Depression, unspecified: Secondary | ICD-10-CM | POA: Diagnosis not present

## 2022-11-05 DIAGNOSIS — G8918 Other acute postprocedural pain: Secondary | ICD-10-CM | POA: Diagnosis not present

## 2022-11-05 DIAGNOSIS — I2510 Atherosclerotic heart disease of native coronary artery without angina pectoris: Secondary | ICD-10-CM | POA: Diagnosis not present

## 2022-11-05 DIAGNOSIS — M159 Polyosteoarthritis, unspecified: Secondary | ICD-10-CM | POA: Diagnosis not present

## 2022-11-05 DIAGNOSIS — M549 Dorsalgia, unspecified: Secondary | ICD-10-CM | POA: Diagnosis not present

## 2022-11-06 ENCOUNTER — Ambulatory Visit: Payer: 59 | Attending: Nurse Practitioner

## 2022-11-06 ENCOUNTER — Ambulatory Visit (INDEPENDENT_AMBULATORY_CARE_PROVIDER_SITE_OTHER): Payer: 59

## 2022-11-06 DIAGNOSIS — I2510 Atherosclerotic heart disease of native coronary artery without angina pectoris: Secondary | ICD-10-CM

## 2022-11-06 DIAGNOSIS — R55 Syncope and collapse: Secondary | ICD-10-CM | POA: Diagnosis not present

## 2022-11-07 LAB — ECHOCARDIOGRAM COMPLETE
AR max vel: 1.92 cm2
AV Area VTI: 1.81 cm2
AV Area mean vel: 1.85 cm2
AV Mean grad: 4 mmHg
AV Peak grad: 6.8 mmHg
Ao pk vel: 1.3 m/s
Area-P 1/2: 2.32 cm2
Calc EF: 70.6 %
MV M vel: 4.15 m/s
MV Peak grad: 68.9 mmHg
S' Lateral: 2.5 cm
Single Plane A2C EF: 70.6 %
Single Plane A4C EF: 73.6 %

## 2022-11-11 ENCOUNTER — Other Ambulatory Visit: Payer: 59

## 2022-11-12 DIAGNOSIS — I2510 Atherosclerotic heart disease of native coronary artery without angina pectoris: Secondary | ICD-10-CM | POA: Diagnosis not present

## 2022-11-12 DIAGNOSIS — M549 Dorsalgia, unspecified: Secondary | ICD-10-CM | POA: Diagnosis not present

## 2022-11-12 DIAGNOSIS — G8918 Other acute postprocedural pain: Secondary | ICD-10-CM | POA: Diagnosis not present

## 2022-11-12 DIAGNOSIS — F32A Depression, unspecified: Secondary | ICD-10-CM | POA: Diagnosis not present

## 2022-11-12 DIAGNOSIS — M519 Unspecified thoracic, thoracolumbar and lumbosacral intervertebral disc disorder: Secondary | ICD-10-CM | POA: Diagnosis not present

## 2022-11-18 DIAGNOSIS — G8918 Other acute postprocedural pain: Secondary | ICD-10-CM | POA: Diagnosis not present

## 2022-11-18 DIAGNOSIS — I2510 Atherosclerotic heart disease of native coronary artery without angina pectoris: Secondary | ICD-10-CM | POA: Diagnosis not present

## 2022-11-18 DIAGNOSIS — I219 Acute myocardial infarction, unspecified: Secondary | ICD-10-CM | POA: Diagnosis not present

## 2022-11-18 DIAGNOSIS — F32A Depression, unspecified: Secondary | ICD-10-CM | POA: Diagnosis not present

## 2022-11-18 DIAGNOSIS — M549 Dorsalgia, unspecified: Secondary | ICD-10-CM | POA: Diagnosis not present

## 2022-11-18 DIAGNOSIS — K5190 Ulcerative colitis, unspecified, without complications: Secondary | ICD-10-CM | POA: Diagnosis not present

## 2022-11-18 DIAGNOSIS — G8929 Other chronic pain: Secondary | ICD-10-CM | POA: Diagnosis not present

## 2022-11-18 DIAGNOSIS — M519 Unspecified thoracic, thoracolumbar and lumbosacral intervertebral disc disorder: Secondary | ICD-10-CM | POA: Diagnosis not present

## 2022-11-21 DIAGNOSIS — R0902 Hypoxemia: Secondary | ICD-10-CM | POA: Diagnosis not present

## 2022-11-21 DIAGNOSIS — J441 Chronic obstructive pulmonary disease with (acute) exacerbation: Secondary | ICD-10-CM | POA: Diagnosis not present

## 2022-11-27 DIAGNOSIS — M549 Dorsalgia, unspecified: Secondary | ICD-10-CM | POA: Diagnosis not present

## 2022-11-27 DIAGNOSIS — I2510 Atherosclerotic heart disease of native coronary artery without angina pectoris: Secondary | ICD-10-CM | POA: Diagnosis not present

## 2022-11-27 DIAGNOSIS — M519 Unspecified thoracic, thoracolumbar and lumbosacral intervertebral disc disorder: Secondary | ICD-10-CM | POA: Diagnosis not present

## 2022-11-27 DIAGNOSIS — G8918 Other acute postprocedural pain: Secondary | ICD-10-CM | POA: Diagnosis not present

## 2022-12-05 ENCOUNTER — Ambulatory Visit: Payer: 59 | Admitting: Nurse Practitioner

## 2022-12-05 NOTE — Progress Notes (Deleted)
Cardiology Office Note:    Date:  10/24/2022  ID:  [name] [name], DOB [birth year], MRN [id number]  PCP:  Lianne Moris, PA-C   [facility] Providers Cardiologist:  Dina Rich, MD     Referring MD: Lianne Moris, PA-C   CC: Here for follow-up  History of Present Illness:    [name] [name] is a very pleasant 62 y.o. female with a hx of the following:   CAD Hyperlipidemia History of CVA History of PE COPD  Previous cardiovascular history includes history of prior stent in 2001.  Cardiac catheterization in 2010 showed patent coronary arteries and stent.  Cardiac catheterization in 2022 showed patent vessels and left circumflex stent.  Echocardiogram at that time revealed normal EF, no wall motion abnormalities, mild MR.   Last seen by Dr. Dina Rich on June 11, 2021.  Patient was doing well at the time.  Denied any chest pain or shortness of breath.  No medication changes were made. Was told to follow-up in 6 months.  Presented to [facility] in October 2023 for altered mental status.   EMS found patient unresponsive at home on the floor, pupils were constricted.  Patient was given Narcan by EMS to improve mental status.  Patient did not remember what happened.  EMS stated blood sugar was elevated.  She denied any pain.  She denied any drug use.  Blood pressure 84/58.  EKG showed normal sinus rhythm.  Our office was contacted for preoperative cardiovascular risk assessment.  Pending posterior cervical fusion with lateral mass fixation and instrumentation along C3-4, C4-5, and C5-6.  Surgery to be performed by Dr. Tressie Stalker on August 26, 2022.   07/29/2022 -  Admitted to 3 syncopal episodes, sounding vasovagal in nature.  She described 1 episode while going to the bathroom on the toilet and after standing up, fell down and hit her head after losing consciousness.  On Christmas Day 2023, she was making a meal, had diarrhea, and later on lost  consciousness.  Denied any acute injuries or cardiac symptoms surrounding events. Denied any stroke-like symptoms. Denied any chest pain, shortness of breath, palpitations, orthopnea, PND, swelling or significant weight changes, acute bleeding, or claudication.  Admitted to lightheadedness and dizziness. Cardiac monitor results below. Scheduled for TTE and carotid doppler for syncope work-up. Negative orthostatics.   09/23/2022 - Had not been doing well over the past week and reported being ill. Endorsed fevers, with chills, and diaphoresis.  Did a COVID test at home that was negative, thought this is either flu or pneumonia. Noted 2 recurrent syncopal episodes since last visit.  First episode happened while getting off the toilet, second episode happened while bending over to do laundry.  She went into the laundry basket, fell and gashed her right eyebrow area, did not require stitches.  Denied any anginal symptoms, but admitted to mid chest pain while coughing. Does admit that BP and HR have been elevated recently, attributed elevated BP due to chronic pain/musculoskeletal issues.  Noted tremors from her chronic neurological/musculoskeletal conditions.  Encouraged her to present to the ED based on symptoms.  CTA of chest and chest x-ray were negative for anything acute.  No PE identified despite elevated D-dimer.  She was sinus tachycardia on EKG, nonspecific ST wave deformity.  Her lungs sounds improved after nebulizer.  Was treated with steroid.  10/24/2022 - Doing better since last visit. Continues to note nasal congestion/URI symptoms.  Denies any chest pain or syncope.  Occasionally has dizziness  while bending over.  Chief concern today is her elevated blood pressure.  It is elevated at home.  Has never been on BP medicine before.  Denies any other associated symptoms or concerns. Denies any chest pain, shortness of breath, palpitations, syncope, presyncope, orthopnea, PND, swelling or significant weight  changes, acute bleeding, or claudication.  Past Medical History:  Diagnosis Date   Acid reflux    Arthritis    CAD (coronary artery disease)    S/P cath with stent in 2001   CHF (congestive heart failure) (HCC)    Collagen vascular disease (HCC)    COPD (chronic obstructive pulmonary disease) (HCC)    CVA (cerebral vascular accident) (HCC) 1992   DU (duodenal ulcer) 09/19/2009   Hiatal hernia    EGD september 2007. normal except for small hiatal hernia. She underwent small bowel biopsy with history of diarrhea at that time. This was negative   History of colonoscopy    August 2007. She had a pedunculated polyp at 30 cm. which was hamartomatous polyp. She  is due for 5-year followup with family history of coln cancer in a brother at age 75   History of colonoscopy    2005 revealed a linear ulcer with scar versus inflammatory process at the mid right colon, but normal terminal ileum. A biopsy of this area revealed an ulceration, but nonspecific   History of coronary angiogram    CT angiogram in 2007. negative with normal mesenteric arteries   History of kidney stones    Migraine    Pneumonia    pt states she gets pneumonia about twice a year   Pulmonary embolus (HCC) 1999   Ulcerative colitis (HCC)    Urinary frequency     Past Surgical History:  Procedure Laterality Date   ABDOMINAL HYSTERECTOMY  1985   ANTERIOR CERVICAL DECOMP/DISCECTOMY FUSION N/A 08/15/2021   Procedure: Cervical Three-Four, Cervical Four-Five, Cervical Five-Six  Anterior cervical decompression/discectomy/fusion/interbody prosthesis/plate/screws;  Surgeon: Tressie Stalker, MD;  Location: [facility] OR;  Service: Neurosurgery;  Laterality: N/A;  Cervical Three-Four, Cervical Four-Five, Cervical Five-Six  Anterior cervical decompression/discectomy/fusion/interbody prosthesis/plate/screws   APPENDECTOMY     CESAREAN SECTION  1979   CHOLECYSTECTOMY  2021   COLONOSCOPY  01/2006   Rourk-pedunculated polyp 30 cm  hamartomatous polyp,   COLONOSCOPY  2005   Lineal ulcer or mid right colon, normal TI nonspecific biopsy   CORONARY ANGIOPLASTY WITH STENT PLACEMENT  2001   [facility], [place]   ESOPHAGOGASTRODUODENOSCOPY  09/19/2009   Rourk -2 duodenal bulbar ulcers, small HH otherwise normal/tiny distal esophageal erosions with mild erosive reflux   ESOPHAGOGASTRODUODENOSCOPY  03/2006   Rourk-Hiatal hernia, negative small bowel biopsy   LEFT HEART CATH AND CORONARY ANGIOGRAPHY N/A 04/18/2021   Procedure: LEFT HEART CATH AND CORONARY ANGIOGRAPHY;  Surgeon: Orbie Pyo, MD;  Location: [facility] INVASIVE CV LAB;  Service: Cardiovascular;  Laterality: N/A;   SPINE SURGERY     TONSILLECTOMY  1986   TUBAL LIGATION     Unilateral oophorectomy with hysterectomy      Current Medications: Current Meds  Medication Sig   albuterol (PROVENTIL HFA;VENTOLIN HFA) 108 (90 BASE) MCG/ACT inhaler Inhale 2 puffs into the lungs every 6 (six) hours as needed for shortness of breath or wheezing.   albuterol (PROVENTIL) (2.5 MG/3ML) 0.083% nebulizer solution Take 3 mLs (2.5 mg total) by nebulization every 4 (four) hours as needed for wheezing or shortness of breath.   atorvastatin (LIPITOR) 40 MG tablet Take 1 tablet (40 mg total)  by mouth daily.   gabapentin (NEURONTIN) 300 MG capsule Take 300 mg by mouth at bedtime.   ondansetron (ZOFRAN-ODT) 8 MG disintegrating tablet Take 8 mg by mouth every 6 (six) hours as needed for vomiting or nausea.   OXYGEN Inhale 4 L into the lungs continuous as needed (shortness of breath).   pantoprazole (PROTONIX) 40 MG tablet TAKE ONE TABLET BY MOUTH DAILY 30-60 MINUTES BEFORE FIRST MEAL OF THE DAY (Patient taking differently: Take 40 mg by mouth at bedtime.)   predniSONE (DELTASONE) 10 MG tablet Take 10 mg by mouth daily with breakfast.   QUEtiapine (SEROQUEL) 100 MG tablet Take 100 mg by mouth at bedtime.   traMADol (ULTRAM) 50 MG tablet Take 50-100 mg by mouth every 6 (six) hours as  needed for moderate pain.     Allergies:   Codeine, Hydrocodone, Sulfa antibiotics, and Hydrocodone-acetaminophen   Social History   Socioeconomic History   Marital status: Married    Spouse name: Not on file   Number of children: 2   Years of education: Not on file   Highest education level: Not on file  Occupational History   Occupation: [employer]  Tobacco Use   Smoking status: Every Day    Packs/day: 0.50    Years: 35.00    Additional pack years: 0.00    Total pack years: 17.50    Types: Cigarettes    Passive exposure: Current   Smokeless tobacco: Never  Vaping Use   Vaping Use: Never used  Substance and Sexual Activity   Alcohol use: No   Drug use: No   Sexual activity: Not Currently  Other Topics Concern   Not on file  Social History Narrative   Not on file   Social Determinants of Health   Financial Resource Strain: Not on file  Food Insecurity: Not on file  Transportation Needs: Not on file  Physical Activity: Not on file  Stress: Not on file  Social Connections: Not on file     Family History: The patient's family history includes Colon cancer (age of onset: 62) in her brother; Diabetes in her sister; Heart attack (age of onset: 50) in her mother.  ROS:     Please see the history of present illness.    All other systems reviewed and are negative.  EKGs/Labs/Other Studies Reviewed:    The following studies were reviewed today:   EKG:  EKG is not ordered today.    Cardiac monitor 09/23/2022:   14 day monitor   Rare supraventricular ectopy in the form of isolated PACs, couplets, triplets. 17 runs of SVT longest 17 beats   Rare ventricular ectopy in the form of isolated PVCs, couplets. Isolated 5 beat run of NSVT   Reported symptoms correlated with normal sinus rhythm     Patch Wear Time:  14 days and 0 hours (2024-01-29T10:20:21-0500 to 2024-02-12T10:20:21-0500)   Patient had a min HR of 58 bpm, max HR of 185 bpm, and avg HR  of 90 bpm. Predominant underlying rhythm was Sinus Rhythm. 1 run of Ventricular Tachycardia occurred lasting 5 beats with a max rate of 179 bpm (avg 161 bpm). 17 Supraventricular Tachycardia  runs occurred, the run with the fastest interval lasting 10 beats with a max rate of 185 bpm, the longest lasting 17 beats with an avg rate of 94 bpm. Isolated SVEs were rare (<1.0%), SVE Couplets were rare (<1.0%), and SVE Triplets were rare (<1.0%).  Isolated VEs were rare (<1.0%), VE Couplets  were rare (<1.0%), and no VE Triplets were present. Ventricular Trigeminy was present.   Left heart cath on April 18, 2021:   Prox RCA lesion is 20% stenosed.   Mid Cx lesion is 10% stenosed.   LV end diastolic pressure is normal.   1.  Widely patent mid left circumflex stent with mild disease elsewhere. 2.  Normal LV function with LVEDP of 17 mmHg.  Echocardiogram on April 17, 2021: 1. Left ventricular ejection fraction, by estimation, is 65 to 70%. The  left ventricle has normal function. The left ventricle has no regional  wall motion abnormalities. Left ventricular diastolic parameters are  indeterminate.   2. Right ventricular systolic function is normal. The right ventricular  size is normal. There is normal pulmonary artery systolic pressure. The  estimated right ventricular systolic pressure is 34.0 mmHg.   3. The mitral valve is grossly normal. Mild mitral valve regurgitation.   4. The aortic valve is tricuspid. Aortic valve regurgitation is not  visualized.   5. The inferior vena cava is normal in size with <50% respiratory  variability, suggesting right atrial pressure of 8 mmHg.   Comparison(s): No prior Echocardiogram.  Recent Labs: 09/23/2022: ALT 11; BUN 14; Creatinine, Ser 0.65; Hemoglobin 13.9; Magnesium 1.8; Platelets 288; Potassium 3.9; Sodium 135; TSH 1.379  Recent Lipid Panel    Component Value Date/Time   CHOL 106 04/17/2021 0702   CHOL 105 04/17/2021 0702   TRIG 97 04/17/2021  0702   TRIG 99 04/17/2021 0702   HDL 46 04/17/2021 0702   HDL 45 04/17/2021 0702   CHOLHDL 2.3 04/17/2021 0702   CHOLHDL 2.3 04/17/2021 0702   VLDL 19 04/17/2021 0702   VLDL 20 04/17/2021 0702   LDLCALC 41 04/17/2021 0702   LDLCALC 40 04/17/2021 0702    Physical Exam:    VS:  There were no vitals taken for this visit.    Wt Readings from Last 3 Encounters:  10/24/22 120 lb 6.4 oz (54.6 kg)  09/23/22 116 lb (52.6 kg)  09/23/22 116 lb 9.6 oz (52.9 kg)     GEN: Thin, 62 y.o. female HEENT: Normal NECK: No JVD; No carotid bruits CARDIAC: S1/S2, RRR, no murmurs, rubs, gallops; 2+ pulses RESPIRATORY:  Diminished to auscultation with scattered wheezing, congested cough, runny nose and evidence of nasal congestion MUSCULOSKELETAL:  No edema; No deformity  SKIN: Warm and dry NEUROLOGIC:  Alert and oriented x 3 PSYCHIATRIC:  Normal affect   ASSESSMENT:    No diagnosis found.   PLAN:    1. Hx of syncope Denies any recurrent syncopal episodes since I last saw her. Etiology unclear, but does sound vasovagal in nature.  Negative orthostatics in past. Previously arranged echocardiogram, arrange carotid Dopplers. Recent monitor negative for any significant arrhythmias or sustained pauses.  Conservative measures discussed for dizziness and lightheadedness, including adequate hydration, wearing compression stockings, and changing positions slowly.  Discussed to avoid driving. She verbalizes understanding.  Highly suspect that several medications that she is taking may be contributing to this including: tramadol, Seroquel, and hydroxyzine. Continue to follow with PCP.  Heart healthy diet and regular cardiovascular exercise encouraged. Will evaluate workup and see if preoperative cardiovascular risk assessment can be performed at next OV.     2. CAD Stable with no anginal symptoms. No indication for ischemic evaluation. Continue current medication regimen. Heart healthy diet and regular  cardiovascular exercise encouraged.   3. HTN, medication management BP on arrival 152/104. Repeat BP 140/90. Currently not on  any antihypertensives. Want to avoid BB with diagnosis of COPD and Ace-inhibitor d/t hx of cough. Will start Losartan 25 mg daily and recheck BMET in 2 weeks per protocol. Discussed to monitor BP at home at least 2 hours after medications and sitting for 5-10 minutes. Heart healthy diet and regular cardiovascular exercise encouraged.   4. HLD Most recent LDL 108. Continue atorvastatin. Not at goal. Not addressed at OV, but plan to discuss Lipid Clinic referral at next OV/Leqvio. Heart healthy diet and regular cardiovascular exercise encouraged.    5. History of CVA  Denies any stroke-like symptoms. Only syncopal episodes as mentioned above. Previously stopped on ASA and plavix stopped, managed by PCP. Continue to follow with PCP. ED precautions discussed.   6. COPD Current URI symptoms. Tx for COPD exacerbation in ED on 09/23/2022. Continue to follow with PCP. Plan to refer to pulmonology at next OV.   7. Disposition: Follow-up in 6 weeks with me or APP or sooner if anything changes.   Medication Adjustments/Labs and Tests Ordered: Current medicines are reviewed at length with the patient today.  Concerns regarding medicines are outlined above.  No orders of the defined types were placed in this encounter.  No orders of the defined types were placed in this encounter.   There are no Patient Instructions on file for this visit.    Signed, Sharlene Dory, NP  12/05/2022 8:25 AM    [facility]

## 2023-03-03 DIAGNOSIS — F419 Anxiety disorder, unspecified: Secondary | ICD-10-CM | POA: Diagnosis not present

## 2023-03-03 DIAGNOSIS — G8929 Other chronic pain: Secondary | ICD-10-CM | POA: Diagnosis not present

## 2023-03-03 DIAGNOSIS — F1120 Opioid dependence, uncomplicated: Secondary | ICD-10-CM | POA: Diagnosis not present

## 2023-03-03 DIAGNOSIS — F32A Depression, unspecified: Secondary | ICD-10-CM | POA: Diagnosis not present

## 2023-03-03 IMAGING — CT CT ANGIO CHEST
2 of 9 series · 18 of 46 positions shown · IV contrast (agent unspecified)
Comparison: Chest x-ray 11/01/2021, CT chest 09/27/2021

CLINICAL DATA: Pt c/o increased SOB X 3 days COPD pt, recently dx
with PN. Pulmonary embolism (PE) suspected, positive D-dimer.

EXAM:
CT ANGIOGRAPHY CHEST WITH CONTRAST
TECHNIQUE: Multidetector CT imaging of the chest was performed using the
standard protocol during bolus administration of intravenous
contrast. Multiplanar CT image reconstructions and MIPs were
obtained to evaluate the vascular anatomy.

[Series 5: pe axial thins · axial · 0.67mm/px · z∈[+1199,+1443]mm · 15 of 347 slices shown]
[im 21/347  lung]
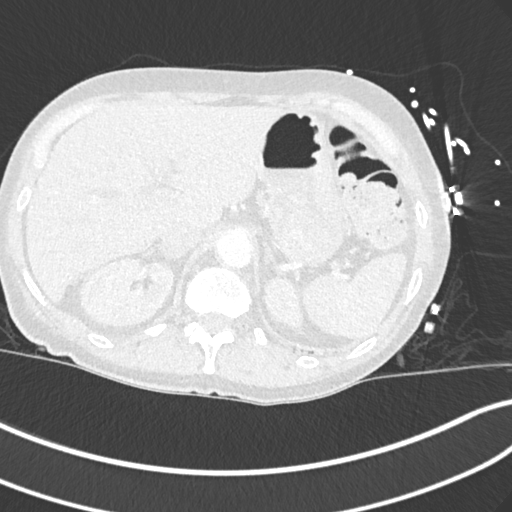
[im 41/347  soft-tissue]
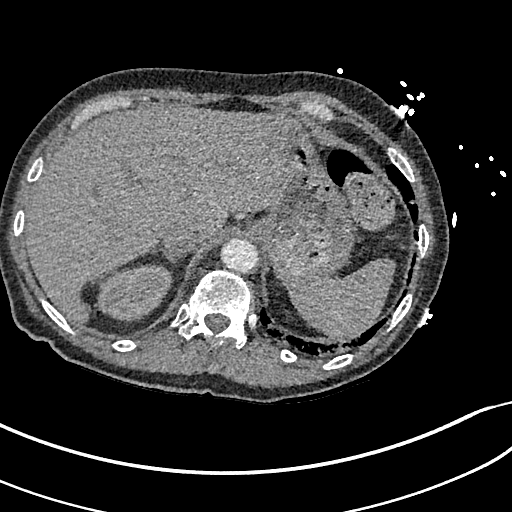
[im 62/347  lung]
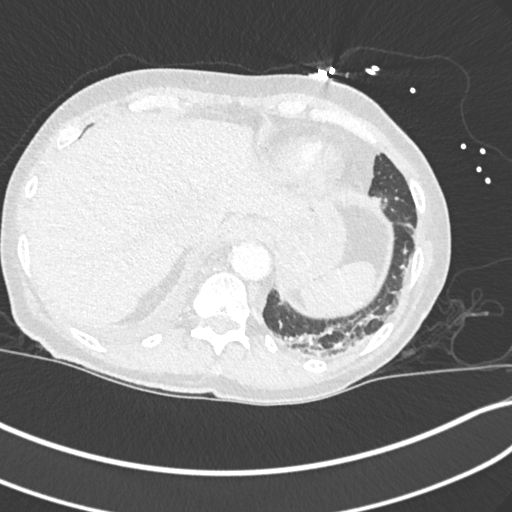
[im 82/347  soft-tissue]
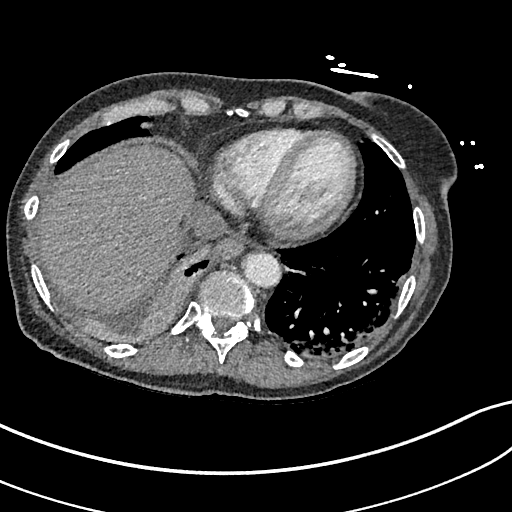
[im 102/347  lung]
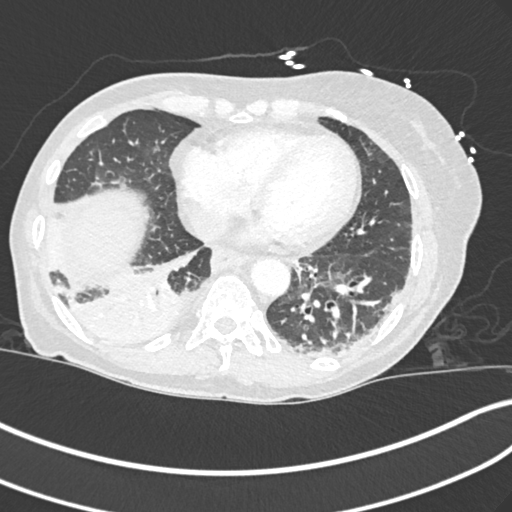
[im 123/347  soft-tissue]
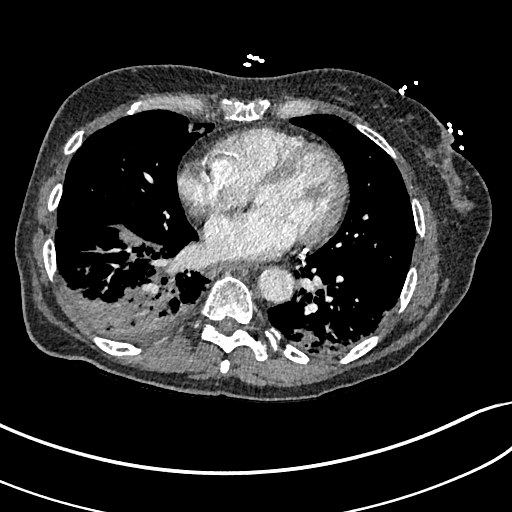
[im 143/347  lung]
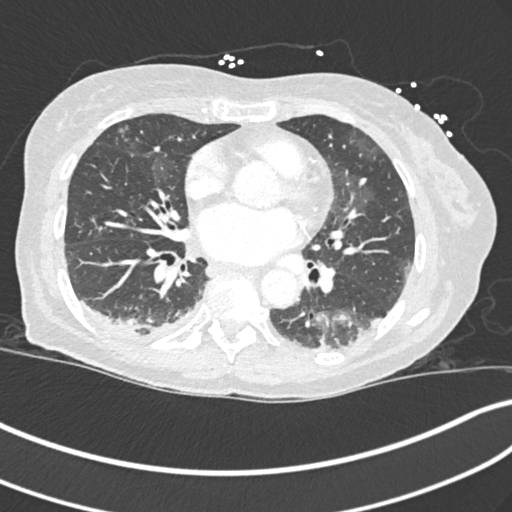
[im 184/347  soft-tissue]
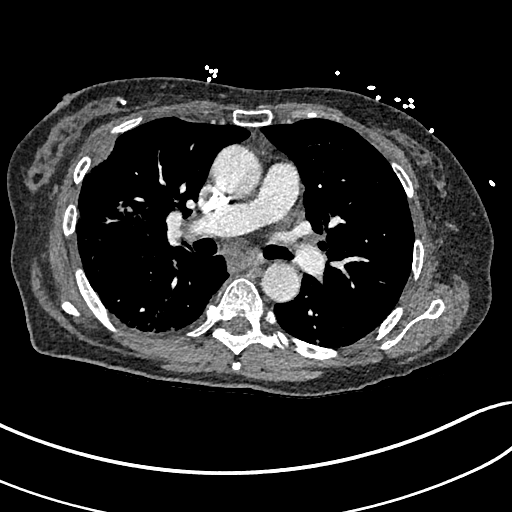
[im 204/347  lung]
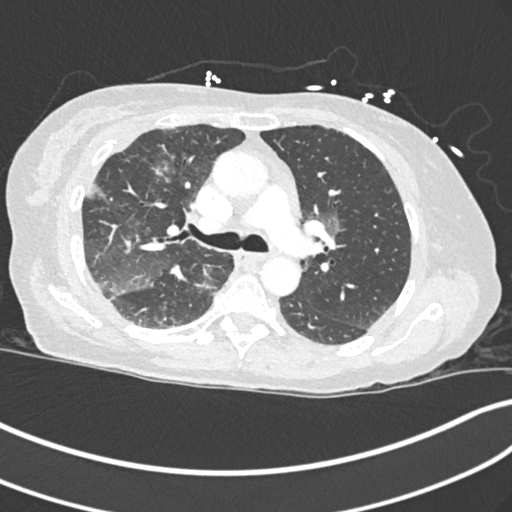
[im 224/347  soft-tissue]
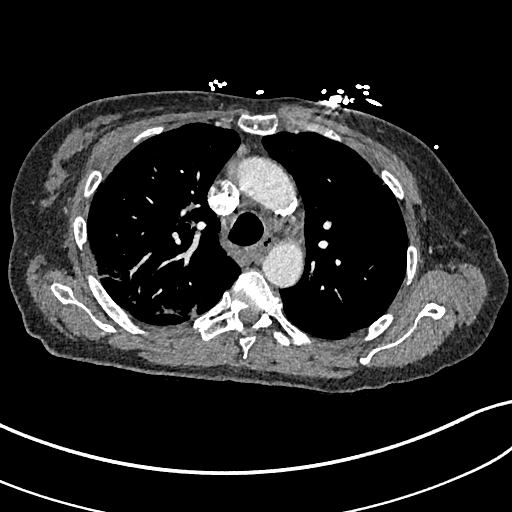
[im 245/347  lung]
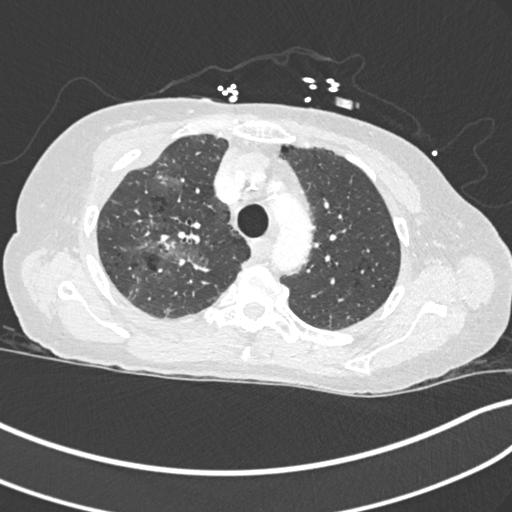
[im 265/347  soft-tissue]
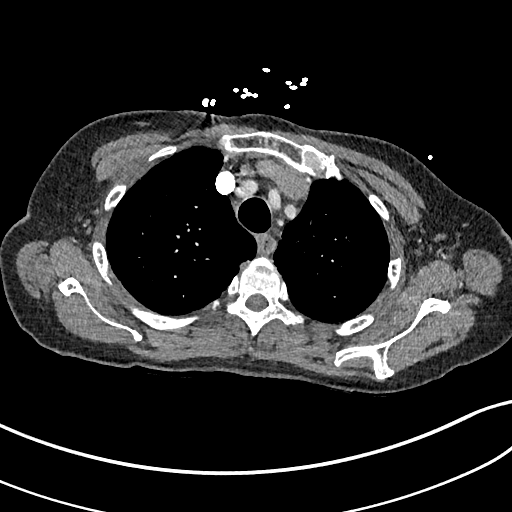
[im 285/347  lung]
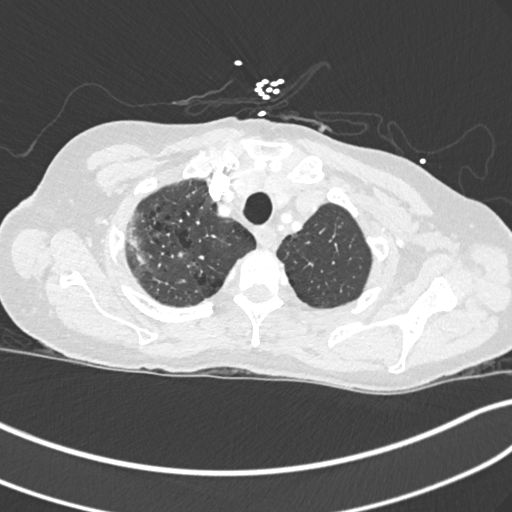
[im 306/347  soft-tissue]
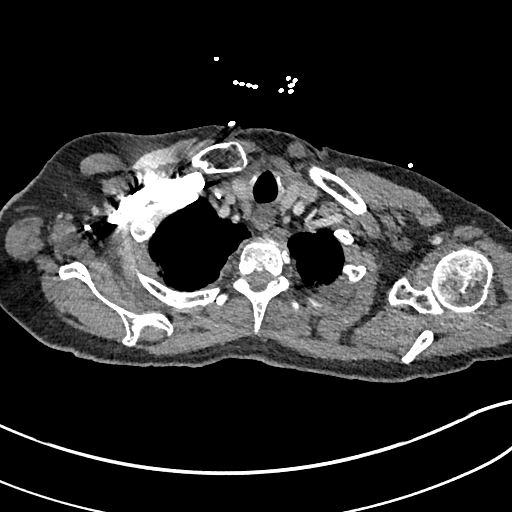
[im 326/347  lung]
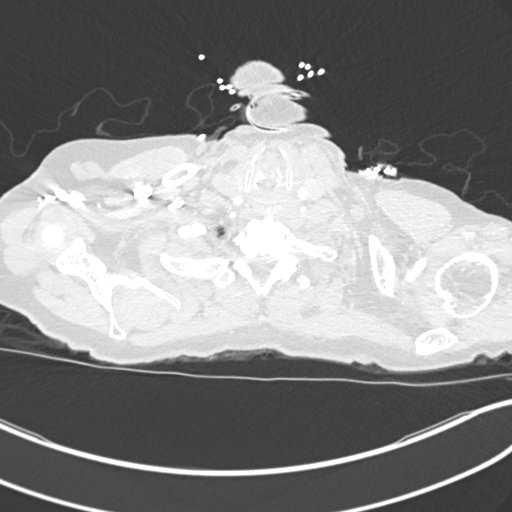

[Series 8: cor soft · coronal · 0.62mm/px · 3 of 132 slices shown]
[im 33/132  soft-tissue]
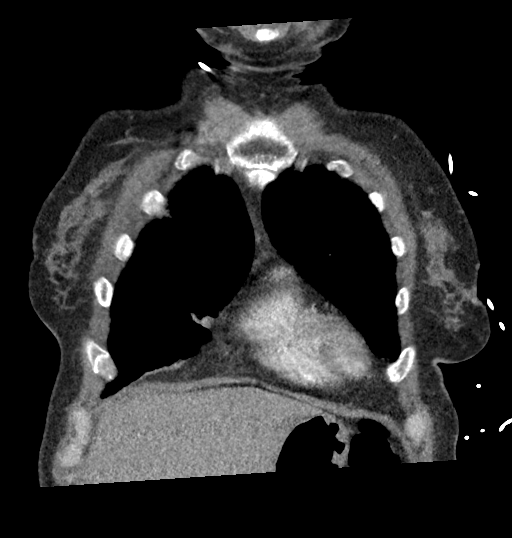
[im 66/132  soft-tissue]
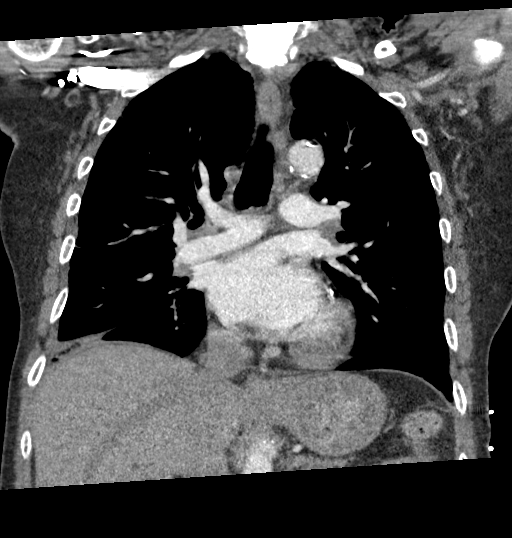
[im 99/132  soft-tissue]
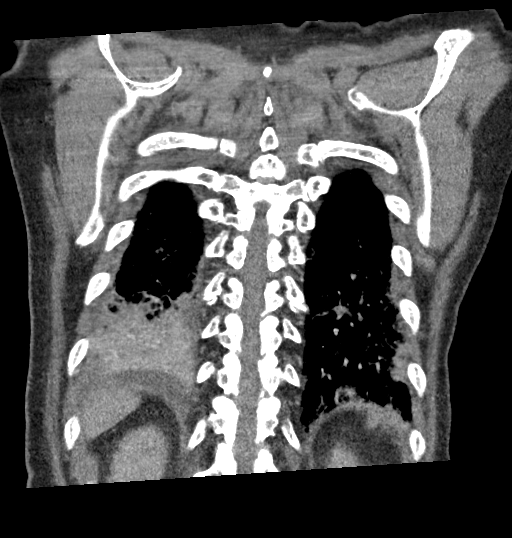

[18 of 46 positions shown; findings below may reference images not displayed]

RADIATION DOSE REDUCTION: This exam was performed according to the
departmental dose-optimization program which includes automated
exposure control, adjustment of the mA and/or kV according to
patient size and/or use of iterative reconstruction technique.

IV infiltrated approximately 20mL contrast in the upper extremity.

CONTRAST:  75mL OMNIPAQUE IOHEXOL 350 MG/ML SOLN
FINDINGS: Cardiovascular: Satisfactory opacification of the pulmonary arteries
to the segmental level. No evidence of pulmonary embolism. Normal
heart size. No significant pericardial effusion. The thoracic aorta
is normal in caliber. Mild atherosclerotic plaque of the thoracic
aorta. Three-vessel coronary artery calcifications.

Mediastinum/Nodes: No enlarged mediastinal, hilar, or axillary lymph
nodes. Thyroid gland, trachea, and esophagus demonstrate no
significant findings.

Lungs/Pleura: Paraseptal and centrilobular emphysematous changes.
Biapical pleural/pulmonary scarring. Interval worsening of diffuse
peribronchovascular ground-glass airspace opacities. Interval
development of a more consolidative right lower lobe consolidation
with air bronchograms. No pulmonary nodule. No pulmonary mass.
Interval development of a trace left pleural effusion. No right
pleural effusion. No pneumothorax.

Upper Abdomen: No acute abnormality.

Musculoskeletal:

No abdominal wall hernia or abnormality.

No suspicious lytic or blastic osseous lesions. No acute displaced
fracture. Cortical irregularity of the distal sternal body likely
old healed fracture.

Review of the MIP images confirms the above findings.
IMPRESSION: 1. No pulmonary embolus.
2. Interval worsening multifocal pneumonia. Recommend follow-up CT
in 3 months to evaluate for resolution and exclude underlying
malignancy.
3.  Interval development of a trace left pleural effusion.
4. Aortic Atherosclerosis (E5ZL3-9XR.R) and Emphysema (E5ZL3-M47.H).
5. Of note, per CT tech IV infiltrated approximately 20mL contrast
in the upper extremity.

## 2023-03-03 IMAGING — DX DG CHEST 2V
2 series · 2 of 2 positions shown · non-contrast
Comparison: Chest x-ray 09/27/2021, CT chest 09/27/2021

CLINICAL DATA: CP/SOB

EXAM:
CHEST - 2 VIEW

[chest pa]
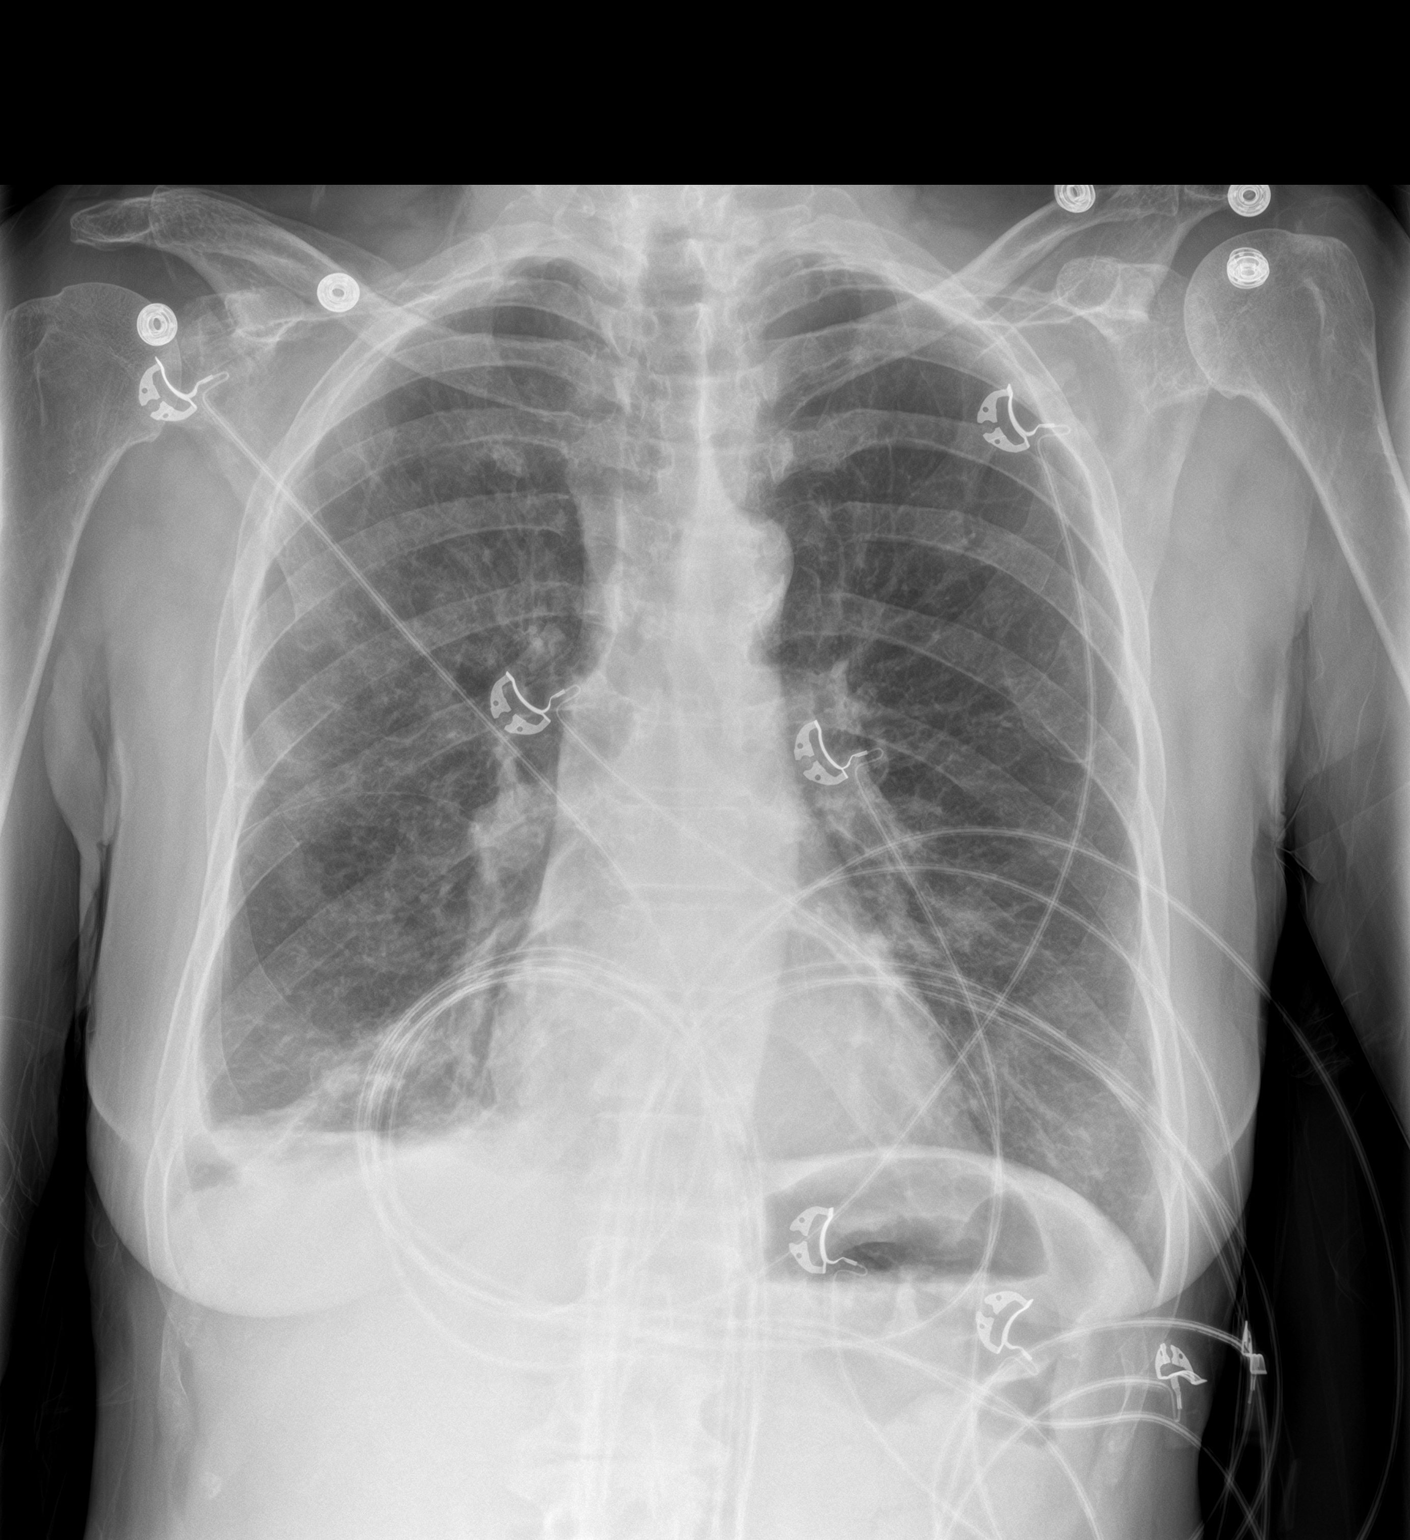

[chest lat]
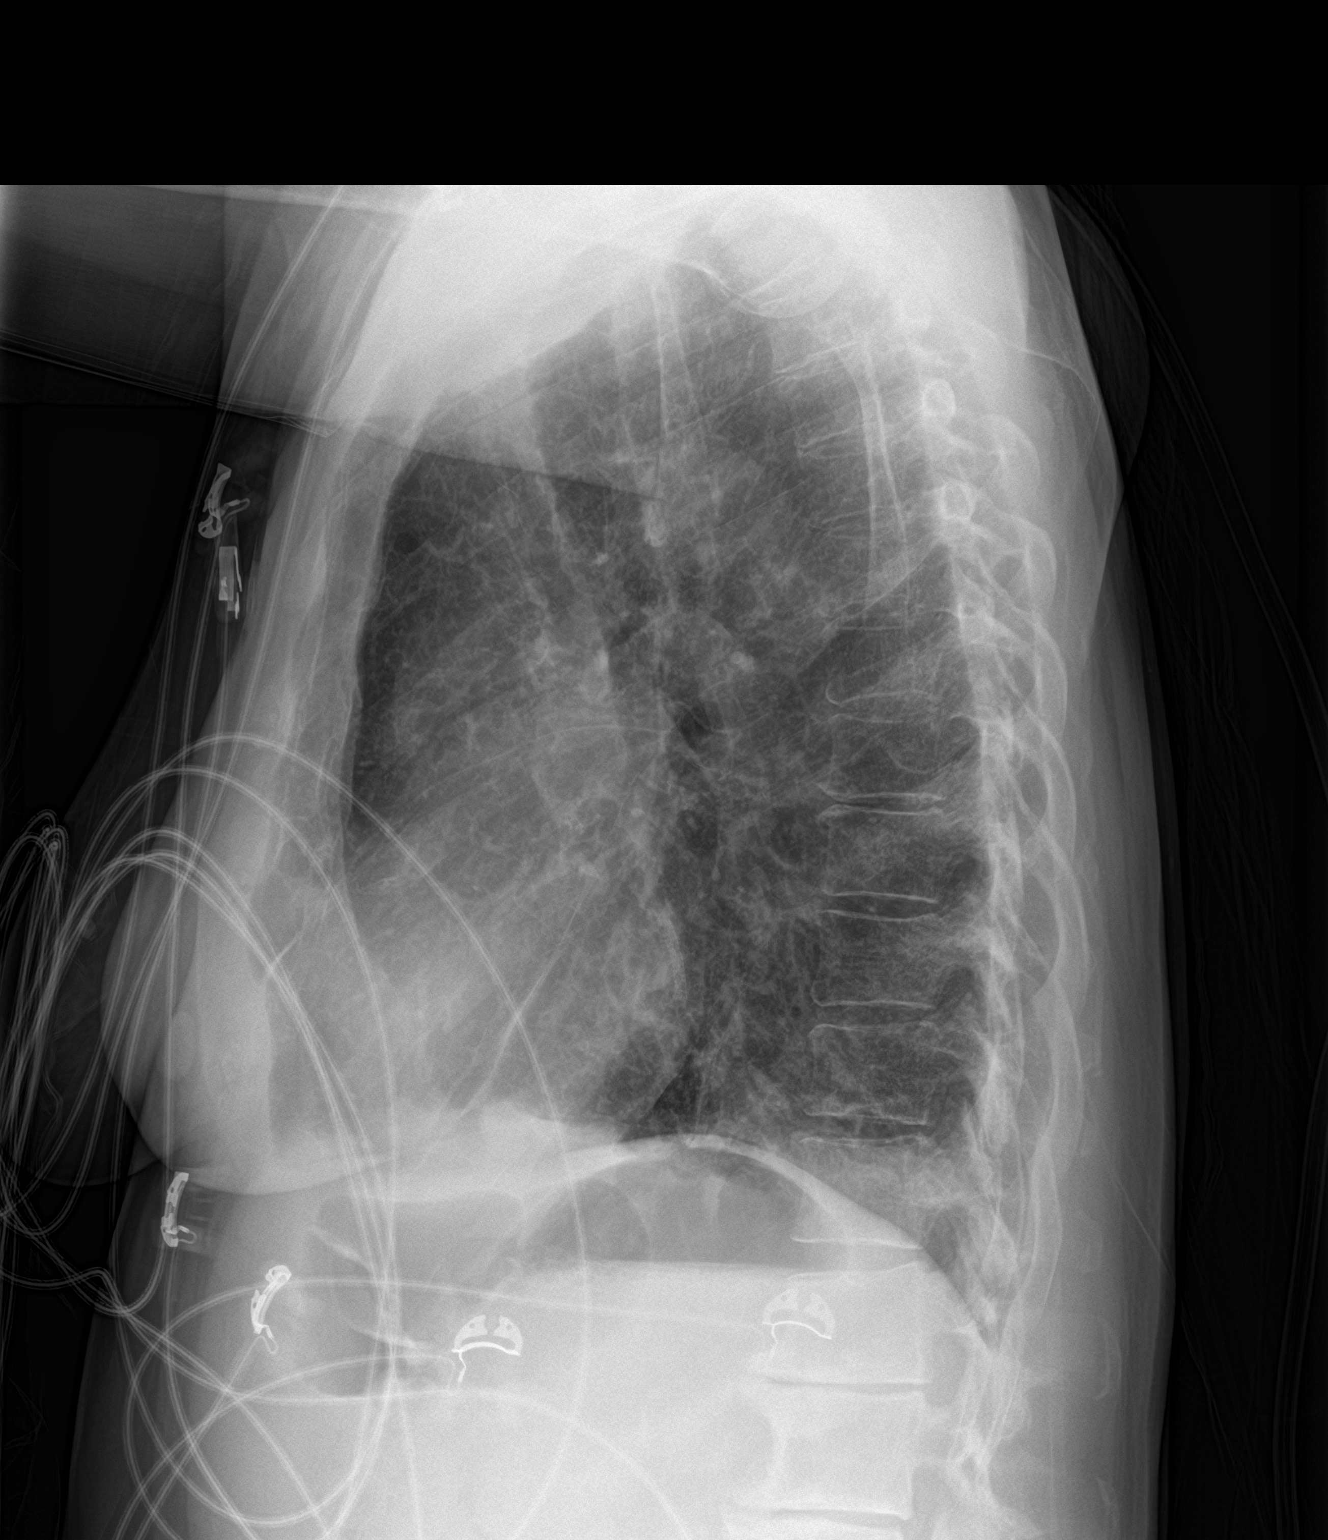

[2 of 2 positions shown; findings below may reference images not displayed]

FINDINGS: The heart and mediastinal contours are unchanged. Aortic
calcification.

Vague patchy airspace opacities. Chronic coarsened interstitial
markings with no overt pulmonary edema. Trace right pleural
effusion. No pneumothorax.

No acute osseous abnormality.
IMPRESSION: 1. Vague patchy airspace opacities that are better evaluated on CT
chest 09/27/2021.
2. Trace right pleural effusion.
3. Aortic Atherosclerosis ([id number].U) and Emphysema ([id number].D).

## 2023-03-10 DIAGNOSIS — F32A Depression, unspecified: Secondary | ICD-10-CM | POA: Diagnosis not present

## 2023-03-10 DIAGNOSIS — F1120 Opioid dependence, uncomplicated: Secondary | ICD-10-CM | POA: Diagnosis not present

## 2023-03-10 DIAGNOSIS — I2510 Atherosclerotic heart disease of native coronary artery without angina pectoris: Secondary | ICD-10-CM | POA: Diagnosis not present

## 2023-03-10 DIAGNOSIS — M549 Dorsalgia, unspecified: Secondary | ICD-10-CM | POA: Diagnosis not present

## 2023-03-17 DIAGNOSIS — F1120 Opioid dependence, uncomplicated: Secondary | ICD-10-CM | POA: Diagnosis not present

## 2023-03-24 DIAGNOSIS — F32A Depression, unspecified: Secondary | ICD-10-CM | POA: Diagnosis not present

## 2023-03-24 DIAGNOSIS — G8929 Other chronic pain: Secondary | ICD-10-CM | POA: Diagnosis not present

## 2023-03-24 DIAGNOSIS — R0902 Hypoxemia: Secondary | ICD-10-CM | POA: Diagnosis not present

## 2023-03-24 DIAGNOSIS — J441 Chronic obstructive pulmonary disease with (acute) exacerbation: Secondary | ICD-10-CM | POA: Diagnosis not present

## 2023-03-24 DIAGNOSIS — M519 Unspecified thoracic, thoracolumbar and lumbosacral intervertebral disc disorder: Secondary | ICD-10-CM | POA: Diagnosis not present

## 2023-03-24 DIAGNOSIS — M549 Dorsalgia, unspecified: Secondary | ICD-10-CM | POA: Diagnosis not present

## 2023-03-24 DIAGNOSIS — F1120 Opioid dependence, uncomplicated: Secondary | ICD-10-CM | POA: Diagnosis not present

## 2023-03-24 DIAGNOSIS — G8918 Other acute postprocedural pain: Secondary | ICD-10-CM | POA: Diagnosis not present

## 2023-03-31 DIAGNOSIS — I2510 Atherosclerotic heart disease of native coronary artery without angina pectoris: Secondary | ICD-10-CM | POA: Diagnosis not present

## 2023-03-31 DIAGNOSIS — F32A Depression, unspecified: Secondary | ICD-10-CM | POA: Diagnosis not present

## 2023-03-31 DIAGNOSIS — M519 Unspecified thoracic, thoracolumbar and lumbosacral intervertebral disc disorder: Secondary | ICD-10-CM | POA: Diagnosis not present

## 2023-03-31 DIAGNOSIS — F1120 Opioid dependence, uncomplicated: Secondary | ICD-10-CM | POA: Diagnosis not present

## 2023-03-31 DIAGNOSIS — G8918 Other acute postprocedural pain: Secondary | ICD-10-CM | POA: Diagnosis not present

## 2023-03-31 DIAGNOSIS — M549 Dorsalgia, unspecified: Secondary | ICD-10-CM | POA: Diagnosis not present

## 2023-03-31 DIAGNOSIS — F419 Anxiety disorder, unspecified: Secondary | ICD-10-CM | POA: Diagnosis not present

## 2023-04-07 DIAGNOSIS — M549 Dorsalgia, unspecified: Secondary | ICD-10-CM | POA: Diagnosis not present

## 2023-04-07 DIAGNOSIS — G8918 Other acute postprocedural pain: Secondary | ICD-10-CM | POA: Diagnosis not present

## 2023-04-07 DIAGNOSIS — F1120 Opioid dependence, uncomplicated: Secondary | ICD-10-CM | POA: Diagnosis not present

## 2023-04-07 DIAGNOSIS — F419 Anxiety disorder, unspecified: Secondary | ICD-10-CM | POA: Diagnosis not present

## 2023-04-07 DIAGNOSIS — I2510 Atherosclerotic heart disease of native coronary artery without angina pectoris: Secondary | ICD-10-CM | POA: Diagnosis not present

## 2023-04-07 DIAGNOSIS — F32A Depression, unspecified: Secondary | ICD-10-CM | POA: Diagnosis not present

## 2023-04-07 DIAGNOSIS — M519 Unspecified thoracic, thoracolumbar and lumbosacral intervertebral disc disorder: Secondary | ICD-10-CM | POA: Diagnosis not present

## 2023-04-14 DIAGNOSIS — F1120 Opioid dependence, uncomplicated: Secondary | ICD-10-CM | POA: Diagnosis not present

## 2023-04-14 DIAGNOSIS — M519 Unspecified thoracic, thoracolumbar and lumbosacral intervertebral disc disorder: Secondary | ICD-10-CM | POA: Diagnosis not present

## 2023-04-14 DIAGNOSIS — I2510 Atherosclerotic heart disease of native coronary artery without angina pectoris: Secondary | ICD-10-CM | POA: Diagnosis not present

## 2023-04-14 DIAGNOSIS — G8918 Other acute postprocedural pain: Secondary | ICD-10-CM | POA: Diagnosis not present

## 2023-04-14 DIAGNOSIS — F419 Anxiety disorder, unspecified: Secondary | ICD-10-CM | POA: Diagnosis not present

## 2023-04-14 DIAGNOSIS — M549 Dorsalgia, unspecified: Secondary | ICD-10-CM | POA: Diagnosis not present

## 2023-04-14 DIAGNOSIS — F32A Depression, unspecified: Secondary | ICD-10-CM | POA: Diagnosis not present

## 2023-04-21 ENCOUNTER — Encounter: Payer: Self-pay | Admitting: Nurse Practitioner

## 2023-04-21 ENCOUNTER — Ambulatory Visit: Payer: Medicare HMO | Attending: Nurse Practitioner | Admitting: Nurse Practitioner

## 2023-04-21 VITALS — BP 118/88 | HR 83 | Ht 64.0 in | Wt 119.0 lb

## 2023-04-21 DIAGNOSIS — E785 Hyperlipidemia, unspecified: Secondary | ICD-10-CM

## 2023-04-21 DIAGNOSIS — F1120 Opioid dependence, uncomplicated: Secondary | ICD-10-CM | POA: Diagnosis not present

## 2023-04-21 DIAGNOSIS — E782 Mixed hyperlipidemia: Secondary | ICD-10-CM

## 2023-04-21 DIAGNOSIS — M519 Unspecified thoracic, thoracolumbar and lumbosacral intervertebral disc disorder: Secondary | ICD-10-CM | POA: Diagnosis not present

## 2023-04-21 DIAGNOSIS — Z87898 Personal history of other specified conditions: Secondary | ICD-10-CM

## 2023-04-21 DIAGNOSIS — Z79899 Other long term (current) drug therapy: Secondary | ICD-10-CM | POA: Diagnosis not present

## 2023-04-21 DIAGNOSIS — F419 Anxiety disorder, unspecified: Secondary | ICD-10-CM | POA: Diagnosis not present

## 2023-04-21 DIAGNOSIS — Z86718 Personal history of other venous thrombosis and embolism: Secondary | ICD-10-CM

## 2023-04-21 DIAGNOSIS — I10 Essential (primary) hypertension: Secondary | ICD-10-CM | POA: Diagnosis not present

## 2023-04-21 DIAGNOSIS — M549 Dorsalgia, unspecified: Secondary | ICD-10-CM | POA: Diagnosis not present

## 2023-04-21 DIAGNOSIS — I2510 Atherosclerotic heart disease of native coronary artery without angina pectoris: Secondary | ICD-10-CM | POA: Diagnosis not present

## 2023-04-21 DIAGNOSIS — G8918 Other acute postprocedural pain: Secondary | ICD-10-CM | POA: Diagnosis not present

## 2023-04-21 DIAGNOSIS — J449 Chronic obstructive pulmonary disease, unspecified: Secondary | ICD-10-CM

## 2023-04-21 DIAGNOSIS — Z01810 Encounter for preprocedural cardiovascular examination: Secondary | ICD-10-CM | POA: Diagnosis not present

## 2023-04-21 DIAGNOSIS — Z8673 Personal history of transient ischemic attack (TIA), and cerebral infarction without residual deficits: Secondary | ICD-10-CM

## 2023-04-21 DIAGNOSIS — F32A Depression, unspecified: Secondary | ICD-10-CM | POA: Diagnosis not present

## 2023-04-21 MED ORDER — ASPIRIN 81 MG PO CAPS: 81.0000 mg | ORAL_CAPSULE | Freq: Every day | ORAL | Status: DC

## 2023-04-21 MED ORDER — ATORVASTATIN CALCIUM 80 MG PO TABS: 40.0000 mg | ORAL_TABLET | Freq: Every day | ORAL | 2 refills | Status: DC

## 2023-04-21 NOTE — Progress Notes (Unsigned)
Cardiology Office Note:    Date:  10/24/2022  ID:  [name] [name], DOB 08-17-1960, MRN [id number]  PCP:  Lianne Moris, PA-C   [facility] Providers Cardiologist:  Dina Rich, MD     Referring MD: Lianne Moris, PA-C   CC: Here for follow-up  History of Present Illness:    [name] [name] is a very pleasant 62 y.o. female with a hx of the following:   CAD Hyperlipidemia History of CVA History of PE COPD  Previous cardiovascular history includes history of prior stent in 2001.  Cardiac catheterization in 2010 showed patent coronary arteries and stent.  Cardiac catheterization in 2022 showed patent vessels and left circumflex stent.  Echocardiogram at that time revealed normal EF, no wall motion abnormalities, mild MR.   Last seen by Dr. Dina Rich on June 11, 2021.  Patient was doing well at the time.  Denied any chest pain or shortness of breath.  No medication changes were made. Was told to follow-up in 6 months.  Presented to [facility] in October 2023 for altered mental status.   EMS found patient unresponsive at home on the floor, pupils were constricted.  Patient was given Narcan by EMS to improve mental status.  Patient did not remember what happened.  EMS stated blood sugar was elevated.  She denied any pain.  She denied any drug use.  Blood pressure 84/58.  EKG showed normal sinus rhythm.  Our office was contacted for preoperative cardiovascular risk assessment.  Pending posterior cervical fusion with lateral mass fixation and instrumentation along C3-4, C4-5, and C5-6.  Surgery to be performed by Dr. Tressie Stalker on August 26, 2022.   07/29/2022 -  Admitted to 3 syncopal episodes, sounding vasovagal in nature.  She described 1 episode while going to the bathroom on the toilet and after standing up, fell down and hit her head after losing consciousness.  On Christmas Day 2023, she was making a meal, had diarrhea, and later on lost  consciousness.  Denied any acute injuries or cardiac symptoms surrounding events. Denied any stroke-like symptoms. Denied any chest pain, shortness of breath, palpitations, orthopnea, PND, swelling or significant weight changes, acute bleeding, or claudication.  Admitted to lightheadedness and dizziness. Cardiac monitor results below. Scheduled for TTE and carotid doppler for syncope work-up. Negative orthostatics.   09/23/2022 - Had not been doing well over the past week and reported being ill. Endorsed fevers, with chills, and diaphoresis.  Did a COVID test at home that was negative, thought this is either flu or pneumonia. Noted 2 recurrent syncopal episodes since last visit.  First episode happened while getting off the toilet, second episode happened while bending over to do laundry.  She went into the laundry basket, fell and gashed her right eyebrow area, did not require stitches.  Denied any anginal symptoms, but admitted to mid chest pain while coughing. Does admit that BP and HR have been elevated recently, attributed elevated BP due to chronic pain/musculoskeletal issues.  Noted tremors from her chronic neurological/musculoskeletal conditions.  Encouraged her to present to the ED based on symptoms.  CTA of chest and chest x-ray were negative for anything acute.  No PE identified despite elevated D-dimer.  She was sinus tachycardia on EKG, nonspecific ST wave deformity.  Her lungs sounds improved after nebulizer.  Was treated with steroid.  10/24/2022 - Doing better since last visit. Continues to note nasal congestion/URI symptoms.  Denies any chest pain or syncope.  Occasionally has dizziness  while bending over.  Chief concern today is her elevated blood pressure.  It is elevated at home.  Has never been on BP medicine before.  Denies any other associated symptoms or concerns. Denies any chest pain, shortness of breath, palpitations, syncope, presyncope, orthopnea, PND, swelling or significant weight  changes, acute bleeding, or claudication.    Restart Aspirin 81 mg daily. Increase Atorvastatin to 80 mg daily. Obtain FLP/LFT in 6 weeks at [facility] patient to hold Aspirin for 1 week prior to her neck surgery with Dr. Lovell Sheehan.  F/u with Dr. Wyline Mood in 6 months.   Past Medical History:  Diagnosis Date   Acid reflux    Arthritis    CAD (coronary artery disease)    S/P cath with stent in 2001   CHF (congestive heart failure) (HCC)    Collagen vascular disease (HCC)    COPD (chronic obstructive pulmonary disease) (HCC)    CVA (cerebral vascular accident) (HCC) 1992   DU (duodenal ulcer) 09/19/2009   Hiatal hernia    EGD september 2007. normal except for small hiatal hernia. She underwent small bowel biopsy with history of diarrhea at that time. This was negative   History of colonoscopy    August 2007. She had a pedunculated polyp at 30 cm. which was hamartomatous polyp. She  is due for 5-year followup with family history of coln cancer in a brother at age 88   History of colonoscopy    2005 revealed a linear ulcer with scar versus inflammatory process at the mid right colon, but normal terminal ileum. A biopsy of this area revealed an ulceration, but nonspecific   History of coronary angiogram    CT angiogram in 2007. negative with normal mesenteric arteries   History of kidney stones    Migraine    Pneumonia    pt states she gets pneumonia about twice a year   Pulmonary embolus (HCC) 1999   Ulcerative colitis (HCC)    Urinary frequency     Past Surgical History:  Procedure Laterality Date   ABDOMINAL HYSTERECTOMY  1985   ANTERIOR CERVICAL DECOMP/DISCECTOMY FUSION N/A 08/15/2021   Procedure: Cervical Three-Four, Cervical Four-Five, Cervical Five-Six  Anterior cervical decompression/discectomy/fusion/interbody prosthesis/plate/screws;  Surgeon: Tressie Stalker, MD;  Location: [facility] OR;  Service: Neurosurgery;  Laterality: N/A;  Cervical Three-Four, Cervical Four-Five,  Cervical Five-Six  Anterior cervical decompression/discectomy/fusion/interbody prosthesis/plate/screws   APPENDECTOMY     CESAREAN SECTION  1979   CHOLECYSTECTOMY  2021   COLONOSCOPY  01/2006   Rourk-pedunculated polyp 30 cm hamartomatous polyp,   COLONOSCOPY  2005   Lineal ulcer or mid right colon, normal TI nonspecific biopsy   CORONARY ANGIOPLASTY WITH STENT PLACEMENT  2001   [facility], [place]   ESOPHAGOGASTRODUODENOSCOPY  09/19/2009   Rourk -2 duodenal bulbar ulcers, small HH otherwise normal/tiny distal esophageal erosions with mild erosive reflux   ESOPHAGOGASTRODUODENOSCOPY  03/2006   Rourk-Hiatal hernia, negative small bowel biopsy   LEFT HEART CATH AND CORONARY ANGIOGRAPHY N/A 04/18/2021   Procedure: LEFT HEART CATH AND CORONARY ANGIOGRAPHY;  Surgeon: Orbie Pyo, MD;  Location: [facility] INVASIVE CV LAB;  Service: Cardiovascular;  Laterality: N/A;   SPINE SURGERY     TONSILLECTOMY  1986   TUBAL LIGATION     Unilateral oophorectomy with hysterectomy      Current Medications: Current Meds  Medication Sig   albuterol (PROVENTIL HFA;VENTOLIN HFA) 108 (90 BASE) MCG/ACT inhaler Inhale 2 puffs into the lungs every 6 (six) hours as needed for shortness  of breath or wheezing.   albuterol (PROVENTIL) (2.5 MG/3ML) 0.083% nebulizer solution Take 3 mLs (2.5 mg total) by nebulization every 4 (four) hours as needed for wheezing or shortness of breath.   atorvastatin (LIPITOR) 40 MG tablet Take 1 tablet (40 mg total) by mouth daily.   gabapentin (NEURONTIN) 300 MG capsule Take 300 mg by mouth at bedtime.   ondansetron (ZOFRAN-ODT) 8 MG disintegrating tablet Take 8 mg by mouth every 6 (six) hours as needed for vomiting or nausea.   OXYGEN Inhale 4 L into the lungs continuous as needed (shortness of breath).   pantoprazole (PROTONIX) 40 MG tablet TAKE ONE TABLET BY MOUTH DAILY 30-60 MINUTES BEFORE FIRST MEAL OF THE DAY (Patient taking differently: Take 40 mg by mouth at bedtime.)    predniSONE (DELTASONE) 10 MG tablet Take 10 mg by mouth daily with breakfast.   QUEtiapine (SEROQUEL) 100 MG tablet Take 100 mg by mouth at bedtime.   traMADol (ULTRAM) 50 MG tablet Take 50-100 mg by mouth every 6 (six) hours as needed for moderate pain.     Allergies:   Codeine, Hydrocodone, Sulfa antibiotics, and Hydrocodone-acetaminophen   Social History   Socioeconomic History   Marital status: Married    Spouse name: Not on file   Number of children: 2   Years of education: Not on file   Highest education level: Not on file  Occupational History   Occupation: [employer]  Tobacco Use   Smoking status: Every Day    Current packs/day: 0.50    Average packs/day: 0.5 packs/day for 35.0 years (17.5 ttl pk-yrs)    Types: Cigarettes    Passive exposure: Current   Smokeless tobacco: Never  Vaping Use   Vaping status: Never Used  Substance and Sexual Activity   Alcohol use: No   Drug use: No   Sexual activity: Not Currently  Other Topics Concern   Not on file  Social History Narrative   Not on file   Social Determinants of Health   Financial Resource Strain: Not on file  Food Insecurity: No Food Insecurity (12/10/2021)   Received from [facility], [facility]   Hunger Vital Sign    Worried About Running Out of Food in the Last Year: Never true    Ran Out of Food in the Last Year: Never true  Transportation Needs: No Transportation Needs (08/23/2020)   Received from [facility], [facility]   PRAPARE - Transportation    Lack of Transportation (Medical): No    Lack of Transportation (Non-Medical): No  Physical Activity: Not on file  Stress: Not on file  Social Connections: Unknown (11/13/2021)   Received from [facility], [facility]   Social Network    Social Network: Not on file     Family History: The patient's family history includes Colon cancer (age of onset: 68) in her brother; Diabetes in her sister; Heart attack (age of onset:  29) in her mother.  ROS:     Please see the history of present illness.    All other systems reviewed and are negative.  EKGs/Labs/Other Studies Reviewed:    The following studies were reviewed today:   EKG:  EKG is not ordered today.    Cardiac monitor 09/23/2022:   14 day monitor   Rare supraventricular ectopy in the form of isolated PACs, couplets, triplets. 17 runs of SVT longest 17 beats   Rare ventricular ectopy in the form of isolated PVCs, couplets. Isolated  5 beat run of NSVT   Reported symptoms correlated with normal sinus rhythm     Patch Wear Time:  14 days and 0 hours (2024-01-29T10:20:21-0500 to 2024-02-12T10:20:21-0500)   Patient had a min HR of 58 bpm, max HR of 185 bpm, and avg HR of 90 bpm. Predominant underlying rhythm was Sinus Rhythm. 1 run of Ventricular Tachycardia occurred lasting 5 beats with a max rate of 179 bpm (avg 161 bpm). 17 Supraventricular Tachycardia  runs occurred, the run with the fastest interval lasting 10 beats with a max rate of 185 bpm, the longest lasting 17 beats with an avg rate of 94 bpm. Isolated SVEs were rare (<1.0%), SVE Couplets were rare (<1.0%), and SVE Triplets were rare (<1.0%).  Isolated VEs were rare (<1.0%), VE Couplets were rare (<1.0%), and no VE Triplets were present. Ventricular Trigeminy was present.   Left heart cath on April 18, 2021:   Prox RCA lesion is 20% stenosed.   Mid Cx lesion is 10% stenosed.   LV end diastolic pressure is normal.   1.  Widely patent mid left circumflex stent with mild disease elsewhere. 2.  Normal LV function with LVEDP of 17 mmHg.  Echocardiogram on April 17, 2021: 1. Left ventricular ejection fraction, by estimation, is 65 to 70%. The  left ventricle has normal function. The left ventricle has no regional  wall motion abnormalities. Left ventricular diastolic parameters are  indeterminate.   2. Right ventricular systolic function is normal. The right ventricular  size is normal.  There is normal pulmonary artery systolic pressure. The  estimated right ventricular systolic pressure is 34.0 mmHg.   3. The mitral valve is grossly normal. Mild mitral valve regurgitation.   4. The aortic valve is tricuspid. Aortic valve regurgitation is not  visualized.   5. The inferior vena cava is normal in size with <50% respiratory  variability, suggesting right atrial pressure of 8 mmHg.   Comparison(s): No prior Echocardiogram.  Recent Labs: 09/23/2022: ALT 11; BUN 14; Creatinine, Ser 0.65; Hemoglobin 13.9; Magnesium 1.8; Platelets 288; Potassium 3.9; Sodium 135; TSH 1.379  Recent Lipid Panel    Component Value Date/Time   CHOL 106 04/17/2021 0702   CHOL 105 04/17/2021 0702   TRIG 97 04/17/2021 0702   TRIG 99 04/17/2021 0702   HDL 46 04/17/2021 0702   HDL 45 04/17/2021 0702   CHOLHDL 2.3 04/17/2021 0702   CHOLHDL 2.3 04/17/2021 0702   VLDL 19 04/17/2021 0702   VLDL 20 04/17/2021 0702   LDLCALC 41 04/17/2021 0702   LDLCALC 40 04/17/2021 0702    Physical Exam:    VS:  There were no vitals taken for this visit.    Wt Readings from Last 3 Encounters:  10/24/22 120 lb 6.4 oz (54.6 kg)  09/23/22 116 lb (52.6 kg)  09/23/22 116 lb 9.6 oz (52.9 kg)     GEN: Thin, 62 y.o. female HEENT: Normal NECK: No JVD; No carotid bruits CARDIAC: S1/S2, RRR, no murmurs, rubs, gallops; 2+ pulses RESPIRATORY:  Diminished to auscultation with scattered wheezing, congested cough, runny nose and evidence of nasal congestion MUSCULOSKELETAL:  No edema; No deformity  SKIN: Warm and dry NEUROLOGIC:  Alert and oriented x 3 PSYCHIATRIC:  Normal affect   ASSESSMENT:    No diagnosis found.   PLAN:    1. Hx of syncope Denies any recurrent syncopal episodes since I last saw her. Etiology unclear, but does sound vasovagal in nature.  Negative orthostatics in past. Previously arranged echocardiogram,  arrange carotid Dopplers. Recent monitor negative for any significant arrhythmias or  sustained pauses.  Conservative measures discussed for dizziness and lightheadedness, including adequate hydration, wearing compression stockings, and changing positions slowly.  Discussed to avoid driving. She verbalizes understanding.  Highly suspect that several medications that she is taking may be contributing to this including: tramadol, Seroquel, and hydroxyzine. Continue to follow with PCP.  Heart healthy diet and regular cardiovascular exercise encouraged. Will evaluate workup and see if preoperative cardiovascular risk assessment can be performed at next OV.       2. CAD Stable with no anginal symptoms. No indication for ischemic evaluation. Continue current medication regimen. Heart healthy diet and regular cardiovascular exercise encouraged.   3. HTN, medication management BP on arrival 152/104. Repeat BP 140/90. Currently not on any antihypertensives. Want to avoid BB with diagnosis of COPD and Ace-inhibitor d/t hx of cough. Will start Losartan 25 mg daily and recheck BMET in 2 weeks per protocol. Discussed to monitor BP at home at least 2 hours after medications and sitting for 5-10 minutes. Heart healthy diet and regular cardiovascular exercise encouraged.   4. HLD Most recent LDL 108. Continue atorvastatin. Not at goal. Not addressed at OV, but plan to discuss Lipid Clinic referral at next OV/Leqvio. Heart healthy diet and regular cardiovascular exercise encouraged.    5. History of CVA  Denies any stroke-like symptoms. Only syncopal episodes as mentioned above. Previously stopped on ASA and plavix stopped, managed by PCP. Continue to follow with PCP. ED precautions discussed.   6. COPD Current URI symptoms. Tx for COPD exacerbation in ED on 09/23/2022. Continue to follow with PCP.   7. Disposition: Follow-up in 6 weeks with me or APP or sooner if anything changes.   Medication Adjustments/Labs and Tests Ordered: Current medicines are reviewed at length with the patient today.   Concerns regarding medicines are outlined above.  No orders of the defined types were placed in this encounter.  No orders of the defined types were placed in this encounter.   There are no Patient Instructions on file for this visit.    Signed, Sharlene Dory, NP  04/21/2023 8:33 AM    [facility]

## 2023-04-21 NOTE — Patient Instructions (Addendum)
Medication Instructions:  Your physician has recommended you make the following change in your medication:  Please restart Aspirin 81 MG CAPS Daily  Please increase atorvastatin (LIPITOR) 80 MG tablet daily   Labwork: FLP and LFT in 6 Weeks at [facility]   Testing/Procedures: None   Follow-Up: Your physician recommends that you schedule a follow-up appointment in: 6 Months   Any Other Special Instructions Will Be Listed Below (If Applicable).  Instruct patient to hold Aspirin for 1 week prior to her neck surgery with Dr. Lovell Sheehan.   If you need a refill on your cardiac medications before your next appointment, please call your pharmacy.

## 2023-04-22 MED ORDER — ATORVASTATIN CALCIUM 80 MG PO TABS: 80.0000 mg | ORAL_TABLET | Freq: Every day | ORAL | 2 refills | Status: DC

## 2023-04-28 DIAGNOSIS — F1120 Opioid dependence, uncomplicated: Secondary | ICD-10-CM | POA: Diagnosis not present

## 2023-04-28 DIAGNOSIS — G8918 Other acute postprocedural pain: Secondary | ICD-10-CM | POA: Diagnosis not present

## 2023-04-28 DIAGNOSIS — M549 Dorsalgia, unspecified: Secondary | ICD-10-CM | POA: Diagnosis not present

## 2023-04-28 DIAGNOSIS — F419 Anxiety disorder, unspecified: Secondary | ICD-10-CM | POA: Diagnosis not present

## 2023-04-28 DIAGNOSIS — M519 Unspecified thoracic, thoracolumbar and lumbosacral intervertebral disc disorder: Secondary | ICD-10-CM | POA: Diagnosis not present

## 2023-04-28 DIAGNOSIS — I2510 Atherosclerotic heart disease of native coronary artery without angina pectoris: Secondary | ICD-10-CM | POA: Diagnosis not present

## 2023-04-28 DIAGNOSIS — F32A Depression, unspecified: Secondary | ICD-10-CM | POA: Diagnosis not present

## 2023-05-06 DIAGNOSIS — M519 Unspecified thoracic, thoracolumbar and lumbosacral intervertebral disc disorder: Secondary | ICD-10-CM | POA: Diagnosis not present

## 2023-05-06 DIAGNOSIS — F32A Depression, unspecified: Secondary | ICD-10-CM | POA: Diagnosis not present

## 2023-05-06 DIAGNOSIS — M549 Dorsalgia, unspecified: Secondary | ICD-10-CM | POA: Diagnosis not present

## 2023-05-06 DIAGNOSIS — F419 Anxiety disorder, unspecified: Secondary | ICD-10-CM | POA: Diagnosis not present

## 2023-05-06 DIAGNOSIS — I2510 Atherosclerotic heart disease of native coronary artery without angina pectoris: Secondary | ICD-10-CM | POA: Diagnosis not present

## 2023-05-06 DIAGNOSIS — G8918 Other acute postprocedural pain: Secondary | ICD-10-CM | POA: Diagnosis not present

## 2023-05-06 DIAGNOSIS — F1120 Opioid dependence, uncomplicated: Secondary | ICD-10-CM | POA: Diagnosis not present

## 2023-05-12 DIAGNOSIS — I2510 Atherosclerotic heart disease of native coronary artery without angina pectoris: Secondary | ICD-10-CM | POA: Diagnosis not present

## 2023-05-12 DIAGNOSIS — F32A Depression, unspecified: Secondary | ICD-10-CM | POA: Diagnosis not present

## 2023-05-12 DIAGNOSIS — F1120 Opioid dependence, uncomplicated: Secondary | ICD-10-CM | POA: Diagnosis not present

## 2023-05-12 DIAGNOSIS — G8918 Other acute postprocedural pain: Secondary | ICD-10-CM | POA: Diagnosis not present

## 2023-05-12 DIAGNOSIS — F419 Anxiety disorder, unspecified: Secondary | ICD-10-CM | POA: Diagnosis not present

## 2023-05-12 DIAGNOSIS — M549 Dorsalgia, unspecified: Secondary | ICD-10-CM | POA: Diagnosis not present

## 2023-05-19 DIAGNOSIS — M549 Dorsalgia, unspecified: Secondary | ICD-10-CM | POA: Diagnosis not present

## 2023-05-19 DIAGNOSIS — K5190 Ulcerative colitis, unspecified, without complications: Secondary | ICD-10-CM | POA: Diagnosis not present

## 2023-05-19 DIAGNOSIS — I219 Acute myocardial infarction, unspecified: Secondary | ICD-10-CM | POA: Diagnosis not present

## 2023-05-19 DIAGNOSIS — F1120 Opioid dependence, uncomplicated: Secondary | ICD-10-CM | POA: Diagnosis not present

## 2023-05-23 ENCOUNTER — Ambulatory Visit: Payer: 59 | Admitting: Nurse Practitioner

## 2023-05-26 DIAGNOSIS — F1120 Opioid dependence, uncomplicated: Secondary | ICD-10-CM | POA: Diagnosis not present

## 2023-05-26 DIAGNOSIS — M549 Dorsalgia, unspecified: Secondary | ICD-10-CM | POA: Diagnosis not present

## 2023-05-26 DIAGNOSIS — I219 Acute myocardial infarction, unspecified: Secondary | ICD-10-CM | POA: Diagnosis not present

## 2023-05-26 DIAGNOSIS — K5190 Ulcerative colitis, unspecified, without complications: Secondary | ICD-10-CM | POA: Diagnosis not present

## 2023-05-28 DIAGNOSIS — F1120 Opioid dependence, uncomplicated: Secondary | ICD-10-CM | POA: Diagnosis not present

## 2023-05-28 DIAGNOSIS — F32A Depression, unspecified: Secondary | ICD-10-CM | POA: Diagnosis not present

## 2023-06-02 DIAGNOSIS — M549 Dorsalgia, unspecified: Secondary | ICD-10-CM | POA: Diagnosis not present

## 2023-06-02 DIAGNOSIS — K5190 Ulcerative colitis, unspecified, without complications: Secondary | ICD-10-CM | POA: Diagnosis not present

## 2023-06-02 DIAGNOSIS — I219 Acute myocardial infarction, unspecified: Secondary | ICD-10-CM | POA: Diagnosis not present

## 2023-06-02 DIAGNOSIS — F1120 Opioid dependence, uncomplicated: Secondary | ICD-10-CM | POA: Diagnosis not present

## 2023-06-04 DIAGNOSIS — F1120 Opioid dependence, uncomplicated: Secondary | ICD-10-CM | POA: Diagnosis not present

## 2023-06-04 DIAGNOSIS — F419 Anxiety disorder, unspecified: Secondary | ICD-10-CM | POA: Diagnosis not present

## 2023-06-09 DIAGNOSIS — F32A Depression, unspecified: Secondary | ICD-10-CM | POA: Diagnosis not present

## 2023-06-09 DIAGNOSIS — K5190 Ulcerative colitis, unspecified, without complications: Secondary | ICD-10-CM | POA: Diagnosis not present

## 2023-06-09 DIAGNOSIS — I219 Acute myocardial infarction, unspecified: Secondary | ICD-10-CM | POA: Diagnosis not present

## 2023-06-09 DIAGNOSIS — M549 Dorsalgia, unspecified: Secondary | ICD-10-CM | POA: Diagnosis not present

## 2023-06-09 DIAGNOSIS — F1120 Opioid dependence, uncomplicated: Secondary | ICD-10-CM | POA: Diagnosis not present

## 2023-06-10 DIAGNOSIS — Z20828 Contact with and (suspected) exposure to other viral communicable diseases: Secondary | ICD-10-CM | POA: Diagnosis not present

## 2023-06-10 DIAGNOSIS — Z6822 Body mass index (BMI) 22.0-22.9, adult: Secondary | ICD-10-CM | POA: Diagnosis not present

## 2023-06-10 DIAGNOSIS — R030 Elevated blood-pressure reading, without diagnosis of hypertension: Secondary | ICD-10-CM | POA: Diagnosis not present

## 2023-06-10 DIAGNOSIS — J449 Chronic obstructive pulmonary disease, unspecified: Secondary | ICD-10-CM | POA: Diagnosis not present

## 2023-06-10 DIAGNOSIS — J209 Acute bronchitis, unspecified: Secondary | ICD-10-CM | POA: Diagnosis not present

## 2023-06-23 DIAGNOSIS — M519 Unspecified thoracic, thoracolumbar and lumbosacral intervertebral disc disorder: Secondary | ICD-10-CM | POA: Diagnosis not present

## 2023-06-23 DIAGNOSIS — F1120 Opioid dependence, uncomplicated: Secondary | ICD-10-CM | POA: Diagnosis not present

## 2023-06-23 DIAGNOSIS — G8918 Other acute postprocedural pain: Secondary | ICD-10-CM | POA: Diagnosis not present

## 2023-06-23 DIAGNOSIS — I2510 Atherosclerotic heart disease of native coronary artery without angina pectoris: Secondary | ICD-10-CM | POA: Diagnosis not present

## 2023-06-23 DIAGNOSIS — M549 Dorsalgia, unspecified: Secondary | ICD-10-CM | POA: Diagnosis not present

## 2023-06-23 DIAGNOSIS — F32A Depression, unspecified: Secondary | ICD-10-CM | POA: Diagnosis not present

## 2023-07-03 ENCOUNTER — Telehealth: Payer: Self-pay | Admitting: Cardiology

## 2023-07-03 NOTE — Telephone Encounter (Signed)
 Patient is requesting call back to discuss past appt for pre-op clearance. She states that she was denied by the requesting office stating that she was not at an acceptable risk for surgery and is requesting call back.

## 2023-07-04 NOTE — Telephone Encounter (Signed)
 Spoke with patient - informed her that per E. Philis Nettle, NP last office note dictation - states that she is at acceptable risk for surgery.  Will fax this office note to Dr. Peggye Ley now.  Patient verbalized understanding.

## 2023-07-04 NOTE — Telephone Encounter (Addendum)
 Spoke with husband Christen Bame) - he will have her give office a call when he gets back home.

## 2023-07-04 NOTE — Telephone Encounter (Signed)
 Pt returning nurse's call. Please advise

## 2023-08-15 ENCOUNTER — Other Ambulatory Visit: Payer: Self-pay | Admitting: Neurosurgery

## 2023-09-01 ENCOUNTER — Other Ambulatory Visit: Payer: Self-pay | Admitting: Neurosurgery

## 2023-09-02 NOTE — Progress Notes (Signed)
 Surgical Instructions   Your procedure is scheduled on Wednesday, March 12th, 2025. Report to [facility] Main Entrance "A" at 8:30 A.M., then check in with the Admitting office. Any questions or running late day of surgery: call [phone]  Questions prior to your surgery date: call [phone], Monday-Friday, 8am-4pm. If you experience any cold or flu symptoms such as cough, fever, chills, shortness of breath, etc. between now and your scheduled surgery, please notify us at the above number.     Remember:  Do not eat or drink after midnight the night before your surgery    Take these medicines the morning of surgery with A SIP OF WATER: Atorvastatin (Lipitor) Budesonide-Formoterol (Symbicort) inhaler   May take these medicines IF NEEDED: Albuterol Inhaler (bring with you on the day of surgery) Albuterol Nebulizer Ondansetron (Zofran)   Per your cardiologist's instructions, Aspirin should be held for 7 days prior to surgery.  Your last dose would be on Wednesday, March 5th.      One week prior to surgery, STOP taking any Aspirin (unless otherwise instructed by your surgeon) Aleve, Naproxen, Ibuprofen, Motrin, Advil, Goody's, BC's, all herbal medications, fish oil, and non-prescription vitamins.                     Do NOT Smoke (Tobacco/Vaping) for 24 hours prior to your procedure.  If you use a CPAP at night, you may bring your mask/headgear for your overnight stay.   You will be asked to remove any contacts, glasses, piercing's, hearing aid's, dentures/partials prior to surgery. Please bring cases for these items if needed.    Patients discharged the day of surgery will not be allowed to drive home, and someone needs to stay with them for 24 hours.  SURGICAL WAITING ROOM VISITATION Patients may have no more than 2 support people in the waiting area - these visitors may rotate.   Pre-op nurse will coordinate an appropriate time for 1 ADULT support person, who may not  rotate, to accompany patient in pre-op.  Children under the age of 14 must have an adult with them who is not the patient and must remain in the main waiting area with an adult.  If the patient needs to stay at the hospital during part of their recovery, the visitor guidelines for inpatient rooms apply.  Please refer to the [facility] website for the visitor guidelines for any additional information.   If you received a COVID test during your pre-op visit  it is requested that you wear a mask when out in public, stay away from anyone that may not be feeling well and notify your surgeon if you develop symptoms. If you have been in contact with anyone that has tested positive in the last 10 days please notify you surgeon.      Pre-operative 5 CHG Bathing Instructions   You can play a key role in reducing the risk of infection after surgery. Your skin needs to be as free of germs as possible. You can reduce the number of germs on your skin by washing with CHG (chlorhexidine gluconate) soap before surgery. CHG is an antiseptic soap that kills germs and continues to kill germs even after washing.   DO NOT use if you have an allergy to chlorhexidine/CHG or antibacterial soaps. If your skin becomes reddened or irritated, stop using the CHG and notify one of our RNs at [phone].   Please shower with the CHG soap starting 4 days before surgery using  the following schedule:     Please keep in mind the following:  DO NOT shave, including legs and underarms, starting the day of your first shower.   You may shave your face at any point before/day of surgery.  Place clean sheets on your bed the day you start using CHG soap. Use a clean washcloth (not used since being washed) for each shower. DO NOT sleep with pets once you start using the CHG.   CHG Shower Instructions:  Wash your face and private area with normal soap. If you choose to wash your hair, wash first with your normal shampoo.   After you use shampoo/soap, rinse your hair and body thoroughly to remove shampoo/soap residue.  Turn the water OFF and apply about 3 tablespoons (45 ml) of CHG soap to a CLEAN washcloth.  Apply CHG soap ONLY FROM YOUR NECK DOWN TO YOUR TOES (washing for 3-5 minutes)  DO NOT use CHG soap on face, private areas, open wounds, or sores.  Pay special attention to the area where your surgery is being performed.  If you are having back surgery, having someone wash your back for you may be helpful. Wait 2 minutes after CHG soap is applied, then you may rinse off the CHG soap.  Pat dry with a clean towel  Put on clean clothes/pajamas   If you choose to wear lotion, please use ONLY the CHG-compatible lotions that are listed below.  Additional instructions for the day of surgery: DO NOT APPLY any lotions, deodorants, cologne, or perfumes.   Do not bring valuables to the hospital. [facility] is not responsible for any belongings/valuables. Do not wear nail polish, gel polish, artificial nails, or any other type of covering on natural nails (fingers and toes) Do not wear jewelry or makeup Put on clean/comfortable clothes.  Please brush your teeth.  Ask your nurse before applying any prescription medications to the skin.     CHG Compatible Lotions   Aveeno Moisturizing lotion  Cetaphil Moisturizing Cream  Cetaphil Moisturizing Lotion  Clairol Herbal Essence Moisturizing Lotion, Dry Skin  Clairol Herbal Essence Moisturizing Lotion, Extra Dry Skin  Clairol Herbal Essence Moisturizing Lotion, Normal Skin  Curel Age Defying Therapeutic Moisturizing Lotion with Alpha Hydroxy  Curel Extreme Care Body Lotion  Curel Soothing Hands Moisturizing Hand Lotion  Curel Therapeutic Moisturizing Cream, Fragrance-Free  Curel Therapeutic Moisturizing Lotion, Fragrance-Free  Curel Therapeutic Moisturizing Lotion, Original Formula  Eucerin Daily Replenishing Lotion  Eucerin Dry Skin Therapy Plus Alpha Hydroxy  Crme  Eucerin Dry Skin Therapy Plus Alpha Hydroxy Lotion  Eucerin Original Crme  Eucerin Original Lotion  Eucerin Plus Crme Eucerin Plus Lotion  Eucerin TriLipid Replenishing Lotion  Keri Anti-Bacterial Hand Lotion  Keri Deep Conditioning Original Lotion Dry Skin Formula Softly Scented  Keri Deep Conditioning Original Lotion, Fragrance Free Sensitive Skin Formula  Keri Lotion Fast Absorbing Fragrance Free Sensitive Skin Formula  Keri Lotion Fast Absorbing Softly Scented Dry Skin Formula  Keri Original Lotion  Keri Skin Renewal Lotion Keri Silky Smooth Lotion  Keri Silky Smooth Sensitive Skin Lotion  Nivea Body Creamy Conditioning Oil  Nivea Body Extra Enriched Lotion  Nivea Body Original Lotion  Nivea Body Sheer Moisturizing Lotion Nivea Crme  Nivea Skin Firming Lotion  NutraDerm 30 Skin Lotion  NutraDerm Skin Lotion  NutraDerm Therapeutic Skin Cream  NutraDerm Therapeutic Skin Lotion  ProShield Protective Hand Cream  Provon moisturizing lotion  Please read over the following fact sheets that you were given.

## 2023-09-03 ENCOUNTER — Encounter (HOSPITAL_COMMUNITY): Payer: Self-pay | Admitting: Physician Assistant

## 2023-09-03 ENCOUNTER — Encounter (HOSPITAL_COMMUNITY)
Admission: RE | Admit: 2023-09-03 | Discharge: 2023-09-03 | Disposition: A | Discharge status: Home / Self Care | Source: Ambulatory Visit | Attending: Neurosurgery | Admitting: Neurosurgery

## 2023-09-03 ENCOUNTER — Encounter (HOSPITAL_COMMUNITY): Payer: Self-pay

## 2023-09-03 ENCOUNTER — Other Ambulatory Visit: Payer: Self-pay

## 2023-09-03 VITALS — BP 108/82 | HR 71 | Temp 98.4°F | Resp 17 | Ht 64.0 in | Wt 129.3 lb

## 2023-09-03 DIAGNOSIS — Z79899 Other long term (current) drug therapy: Secondary | ICD-10-CM | POA: Diagnosis not present

## 2023-09-03 DIAGNOSIS — Z01818 Encounter for other preprocedural examination: Secondary | ICD-10-CM

## 2023-09-03 DIAGNOSIS — Z01812 Encounter for preprocedural laboratory examination: Secondary | ICD-10-CM | POA: Diagnosis present

## 2023-09-03 LAB — BASIC METABOLIC PANEL
Anion gap: 7 (ref 5–15)
BUN: 21 mg/dL (ref 8–23)
CO2: 28 mmol/L (ref 22–32)
Calcium: 9 mg/dL (ref 8.9–10.3)
Chloride: 105 mmol/L (ref 98–111)
Creatinine, Ser: 0.7 mg/dL (ref 0.44–1.00)
GFR, Estimated: >60 mL/min (ref >60)
Glucose, Bld: 91 mg/dL (ref 70–99)
Potassium: 4.9 mmol/L (ref 3.5–5.1)
Sodium: 140 mmol/L (ref 135–145)

## 2023-09-03 LAB — CBC
HCT: 40.8 % (ref 36.0–46.0)
Hemoglobin: 13.4 g/dL (ref 12.0–15.0)
MCH: 29.8 pg (ref 26.0–34.0)
MCHC: 32.8 g/dL (ref 30.0–36.0)
MCV: 90.7 fL (ref 80.0–100.0)
Platelets: 246 10*3/uL (ref 150–400)
RBC: 4.5 MIL/uL (ref 3.87–5.11)
RDW: 13.7 % (ref 11.5–15.5)
WBC: 9.2 10*3/uL (ref 4.0–10.5)
nRBC: 0 % (ref 0.0–0.2)

## 2023-09-03 LAB — SURGICAL PCR SCREEN
MRSA, PCR: NEGATIVE
Staphylococcus aureus: POSITIVE — AB

## 2023-09-03 NOTE — Progress Notes (Addendum)
 PCP - Lianne Moris at [facility] in [facility] Cardiologist - Dr. Dina Rich Pulmonologist - has not seen since January 2023  PPM/ICD - denies Device Orders - n/a Rep Notified - n/a  Chest x-ray - 09/23/22 EKG - 09/23/22 Stress Test - 04/17/21 ECHO - 11/06/22 Cardiac Cath - 04/18/21   Sleep Study - denies CPAP - n/a  No DM  Last dose of GLP1 agonist-  n/a GLP1 instructions: n/a  Blood Thinner Instructions: n/a Aspirin Instructions:  patient states she has not started taking her Aspirin- read patient the Aspirin instructions from her cardiologist appointment back in October - patient was to start taking Aspirin again but needed to hold 7 days prior to surgery.   ERAS Protcol - NPO   COVID TEST- n/a   Anesthesia review: yes - COPD, CAD, cardiac clearance  Patient has a runny nose at PAT appointment and states that she has a non-productive cough in the mornings.  Patient also informed nurse that she no longer uses her Symbicort inhaler, has not used her nebulizer recently but did use her Albuterol Inhaler on Saturday.  Patient also states that she gets pneumonia 2-3 times year.  Patient was told that she needs to contact her PCP ASAP to be seen for her medication regimen.   Dr. Lovell Sheehan' office was notified that the patient needs to be seen by her PCP.    Patient does wear oxygen at night but has not needed her oxygen during the day.    Patient denies shortness of breath, fever, cough and chest pain at PAT appointment   All instructions explained to the patient, with a verbal understanding of the material. Patient agrees to go over the instructions while at home for a better understanding. Patient also instructed to self quarantine after being tested for COVID-19. The opportunity to ask questions was provided.

## 2023-09-04 ENCOUNTER — Telehealth: Payer: Self-pay | Admitting: Internal Medicine

## 2023-09-04 LAB — TYPE AND SCREEN
ABO/RH(D): O POS
Antibody Screen: NEGATIVE

## 2023-09-04 NOTE — Telephone Encounter (Signed)
 Fax received from Dr. Tressie Stalker with [facility] to perform a cervical fusion under general anesthesia on patient.  Patient needs surgery clearance. Surgery is 09/10/23. Patient was seen on 07/10/21. Office protocol is a risk assessment can be sent to surgeon if patient has been seen in 60 days or less.   Pt will need appt to be cleared  She is scheduled for appt with Dr. Sherene Sires on 09/10/23, however, this is the surgery date  I called to discuss with Lowella Bandy at [facility] and I also called the pt and there was no answer- LMTCB

## 2023-09-04 NOTE — Telephone Encounter (Signed)
 Spoke with Lowella Bandy, scheduler for Dr. Lovell Sheehan  They are aware that the pt needs ov here and so they cancelled her surgery for now  Will hold in clearance pool until visit with Dr Sherene Sires is done and then will fax info to them

## 2023-09-05 ENCOUNTER — Encounter: Payer: Self-pay | Admitting: Cardiology

## 2023-09-09 NOTE — Progress Notes (Unsigned)
 [name] [name], female    DOB: May 05, 1961   MRN: [id number]   Brief patient profile:  69  yowf active smoker  referred to pulmonary clinic in [place]  07/10/2021 by [name] Gosling PA for copd with symptoms starting around 2012  prev eval by Dr [name] [name] with GOLD 0 critieria/ AB phenotype  but requested office closer to home      History of Present Illness  07/10/2021  Pulmonary/ 1st office eval/ [name] [name] / [name]  on advair 250 Chief Complaint  Patient presents with   New Patient (Initial Visit)    Patient here for copd and emphysema. Ref by cardiologist. States she is using 4LO2 cont. At night all night and prn during day. Coughing up green/brown mucus.   Dyspnea:  [employer] shopping / no HC parking / some limited by neck from houswork Cough: every night at hs after lie down and also while asleep x one year / augmentin best also helps nasal congestion/ coughs so hard gets fainty Sleep: bed is flat with pillows x 40 degrees with overt hb / on 02  SABA use: twice daily hfa/ neb once every 2 weeks Rec Plan A = Automatic = Always=    Symbicort 80 Take 2 puffs first thing in am and then another 2 puffs about 12 hours later.  Work on inhaler technique:  Plan B = Backup (to supplement plan A, not to replace it) Only use your albuterol inhaler as a rescue medication Plan C = Crisis (instead of Plan B but only if Plan B stops working) - only use your albuterol nebulizer if you first try Plan B  For cough /congestion > mucinex dm 1200 mg twice daily = 2400 mg total per 24 hours Prednisone 10 mg take  4 each am x 2 days,   2 each am x 2 days,  1 each am x 2 days and stop  Augmentin 875 mg take one pill twice daily  X 10 days  Pantoprazole (protonix) 40 mg   Take  30-60 min before first meal of the day and Pepcid (famotidine)  20 mg after supper until return to office  GERD diet reviewed, bed blocks rec  The key is to stop smoking completely before smoking completely stops  you!  Please schedule a follow up office visit in 6 weeks, call sooner if needed with all medications /inhalers/ solutions in hand    09/10/2023  f/u ov/[name] re: AB maint on symb 80  did not  bring meds / did not return as requested, and now needing clearance for neck surgery/ still smoking but cutting dwon  Chief Complaint  Patient presents with   Medical Clearance    Pt is here for surgery clearance , she also is having coughing congestion  she is taking doxcy. 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    Also denies any obvious fluctuation of symptoms with weather or environmental changes or other aggravating or alleviating factors except as outlined above   No unusual exposure hx or h/o childhood pna/ asthma or knowledge  of premature birth.  Current Allergies, Complete Past Medical History, Past Surgical History, Family History, and Social History were reviewed in [employer] record.  ROS  The following are not active complaints unless bolded Hoarseness, sore throat, dysphagia, dental problems, itching, sneezing,  nasal congestion or discharge of excess mucus or purulent secretions, ear ache,   fever, chills, sweats, unintended wt loss or wt gain, classically pleuritic or exertional cp,  orthopnea pnd or arm/hand swelling  or leg swelling, presyncope, palpitations, abdominal pain, anorexia, nausea, vomiting, diarrhea  or change in bowel habits or change in bladder habits, change in stools or change in urine, dysuria, hematuria,  rash, arthralgias, visual complaints, headache, numbness, weakness or ataxia or problems with walking  or coordination,  change in mood or  memory.        Current Meds  Medication Sig   albuterol (PROVENTIL HFA;VENTOLIN HFA) 108 (90 BASE) MCG/ACT inhaler Inhale 2 puffs into the lungs every 6 (six) hours as needed for shortness of breath or wheezing.   albuterol (PROVENTIL) (2.5 MG/3ML) 0.083% nebulizer solution Take 3 mLs (2.5 mg total) by nebulization every 4 (four) hours as needed for wheezing or shortness of breath.   aspirin EC 81 MG tablet Take 81 mg by mouth daily. Swallow whole.   budesonide-formoterol (SYMBICORT) 80-4.5 MCG/ACT inhaler Take 2 puffs first thing in am and then another 2 puffs about 12 hours later.   buprenorphine (SUBUTEX) 8 MG SUBL SL tablet Place 8 mg under the tongue in the morning, at noon, in the evening, and at bedtime.   gabapentin (NEURONTIN) 300 MG capsule Take 300 mg by mouth at bedtime.   guaiFENesin (MUCINEX) 600 MG 12 hr tablet Take 1 tablet (600 mg total) by mouth 2 (two) times daily.   ondansetron (ZOFRAN-ODT) 8 MG disintegrating tablet Take 8 mg by mouth every 6 (six) hours as needed for vomiting or nausea.   OXYGEN Inhale 4 L into the lungs continuous as needed (shortness of breath).   pantoprazole (PROTONIX) 40 MG tablet TAKE ONE TABLET BY MOUTH DAILY 30-60 MINUTES BEFORE FIRST MEAL OF THE DAY (Patient taking differently: Take 40 mg by mouth at bedtime.)   promethazine (PHENERGAN) 25 MG tablet Take 25 mg by mouth every 6 (six) hours as needed.   QUEtiapine (SEROQUEL) 100 MG tablet Take 100 mg by mouth at bedtime.                    Past Medical History:  Diagnosis Date   Acid reflux    CAD (coronary artery disease)    S/P cath with stent in 2001   CHF (congestive heart failure) (HCC)    Collagen vascular disease (HCC)    COPD (chronic obstructive pulmonary disease) (HCC)    CVA (cerebral vascular accident) (HCC) 1992   DU (duodenal ulcer) 09/19/2009   Hiatal hernia    EGD september 2007. normal except for small hiatal hernia. She underwent  small bowel biopsy with history of diarrhea at that time. This was negative   History of colonoscopy    August 2007. She had a pedunculated polyp at 30 cm. which was hamartomatous polyp. She  is due for 5-year followup with family history of coln cancer in a brother at age 59   History of colonoscopy    2005 revealed a linear ulcer with scar versus inflammatory process at the mid right colon, but normal terminal ileum. A biopsy of this area revealed an ulceration, but nonspecific  History of coronary angiogram    CT angiogram in 2007. negative with normal mesenteric arteries   History of kidney stones    Migraine    Pulmonary embolus (HCC) 1999   Ulcerative colitis (HCC)    Urinary frequency       Objective:     Wt Readings from Last 3 Encounters:  09/10/23 127 lb (57.6 kg)  09/03/23 129 lb 4.8 oz (58.7 kg)  04/21/23 119 lb (54 kg)      Vital signs reviewed  09/10/2023  - Note at rest 02 sats  ***% on ***   General appearance:    amb wf/ congested cough     HEENT : Oropharynx  ***  Nasal turbinates ***   NECK :  without  apparent JVD/ palpable Nodes/TM    LUNGS: no acc muscle use,  Min barrel  contour chest wall with bilateral  junky mostly upper airway rhonchi     CV:  RRR  no s3 or murmur or increase in P2, and no edema   ABD:  soft and nontender with pos end  insp Hoover's  in the supine position.  No bruits or organomegaly appreciated   MS:  Nl gait/ ext warm without deformities Or obvious joint restrictions  calf tenderness, cyanosis or clubbing     SKIN: warm and dry without lesions    NEURO:  alert, approp, nl sensorium with  no motor or cerebellar deficits apparent.            Assessment

## 2023-09-10 ENCOUNTER — Ambulatory Visit: Admitting: Internal Medicine

## 2023-09-10 ENCOUNTER — Encounter: Payer: Self-pay | Admitting: Internal Medicine

## 2023-09-10 ENCOUNTER — Inpatient Hospital Stay (HOSPITAL_COMMUNITY): Admit: 2023-09-10 | Payer: Medicare HMO | Admitting: Neurosurgery

## 2023-09-10 VITALS — BP 109/73 | HR 85 | Ht 64.0 in | Wt 127.0 lb

## 2023-09-10 DIAGNOSIS — K219 Gastro-esophageal reflux disease without esophagitis: Secondary | ICD-10-CM

## 2023-09-10 DIAGNOSIS — J449 Chronic obstructive pulmonary disease, unspecified: Secondary | ICD-10-CM

## 2023-09-10 DIAGNOSIS — F17210 Nicotine dependence, cigarettes, uncomplicated: Secondary | ICD-10-CM

## 2023-09-10 SURGERY — POSTERIOR CERVICAL FUSION/FORAMINOTOMY LEVEL 3
Anesthesia: General

## 2023-09-10 MED ORDER — PREDNISONE 10 MG PO TABS: ORAL_TABLET | ORAL | 0 refills | Status: DC

## 2023-09-10 MED ORDER — FAMOTIDINE 20 MG PO TABS: ORAL_TABLET | ORAL | 11 refills | Status: DC

## 2023-09-10 MED ORDER — AMOXICILLIN-POT CLAVULANATE 875-125 MG PO TABS: 1.0000 | ORAL_TABLET | Freq: Two times a day (BID) | ORAL | 0 refills | Status: DC

## 2023-09-10 MED ORDER — BREZTRI AEROSPHERE 160-9-4.8 MCG/ACT IN AERO: 2.0000 | INHALATION_SPRAY | Freq: Two times a day (BID) | RESPIRATORY_TRACT | Status: DC

## 2023-09-10 MED ORDER — PANTOPRAZOLE SODIUM 40 MG PO TBEC
40.0000 mg | DELAYED_RELEASE_TABLET | Freq: Every day | ORAL | 2 refills | Status: DC
Start: 2023-09-10 — End: 2023-11-26

## 2023-09-10 MED ORDER — ALBUTEROL SULFATE (2.5 MG/3ML) 0.083% IN NEBU: 2.5000 mg | INHALATION_SOLUTION | RESPIRATORY_TRACT | 12 refills | Status: DC | PRN

## 2023-09-10 NOTE — Patient Instructions (Addendum)
 Plan A = Automatic = Always=    Symbicort 80 (or breztri)  Take 2 puffs first thing in am and then another 2 puffs about 12 hours later.    Work on inhaler technique:  relax and gently blow all the way out then take a nice smooth full deep breath back in, triggering the inhaler at same time you start breathing in.  Hold for up to 5 seconds if you can. Blow out thru nose. Rinse and gargle with water when done.  If mouth or throat bother you at all,  try brushing teeth/gums/tongue with arm and hammer toothpaste/ make a slurry and gargle and spit out.     Plan B = Backup (to supplement plan A, not to replace it) Only use your albuterol inhaler as a rescue medication to be used if you can't catch your breath by resting or doing a relaxed purse lip breathing pattern.  - The less you use it, the better it will work when you need it. - Ok to use the inhaler up to 2 puffs  every 4 hours if you must but call for appointment if use goes up over your usual need - Don't leave home without it !!  (think of it like the spare tire for your car)   Plan C = Crisis (instead of Plan B but only if Plan B stops working) - only use your albuterol nebulizer if you first try Plan B and it fails to help > ok to use the nebulizer up to every 4 hours but if start needing it regularly call for immediate appointment   For cough/ congestion > mucinex or mucinex dm  up to maximum of  1200 mg every 12 hours and use the flutter valve as much as you can    Prednisone 10 mg take  4 each am x 2 days,   2 each am x 2 days,  1 each am x 2 days and stop    Augmentin 875 mg take one pill twice daily  X 10 days - take at breakfast and supper with large glass of water.  It would help reduce the usual side effects (diarrhea and yeast infections) if you ate cultured yogurt at lunch.   Pantoprazole (protonix) 40 mg   Take  30-60 min before first meal of the day and Pepcid (famotidine)  20 mg after supper until return to office - this is  the best way to tell whether stomach acid is contributing to your problem.    GERD (REFLUX)  is an extremely common cause of respiratory symptoms just like yours , many times with no obvious heartburn at all.    It can be treated with medication, but also with lifestyle changes including elevation of the head of your bed (ideally with 6-8inch blocks under the headboard of your bed),  Smoking cessation, avoidance of late meals, excessive alcohol, and avoid fatty foods, chocolate, peppermint, colas, red wine, and acidic juices such as orange juice.  NO MINT OR MENTHOL PRODUCTS SO NO COUGH DROPS  USE SUGARLESS CANDY INSTEAD (Jolley ranchers or Stover's or Life Savers) or even ice chips will also do - the key is to swallow to prevent all throat clearing. NO OIL BASED VITAMINS - use powdered substitutes.  Avoid fish oil when coughing.   The key is to stop smoking completely before smoking completely stops you!   Please schedule a follow up office visit in 2  weeks, call sooner if needed with all  medications /inhalers/ solutions in hand so we can verify exactly what you are taking. This includes all medications from all doctors and over the counters

## 2023-09-11 NOTE — Assessment & Plan Note (Signed)
 Active smoker PFT's  10/05/19 FEV1 (2.10 % ) ratio 0.77  p 2 % improvement from saba p 0 prior to study with DLCO  14.86 (76%) corrects to 3.42 (80%)  for alv volume and FV curve min concavity   - 07/10/2021 try symbicort 80 2bid and max gerd rx  - 09/10/2023  After extensive coaching inhaler device,  effectiveness =    60% from a baseline of 30% using breztri sample  Refractory  AB  = DDX of  difficult airways management almost all start with A and  include Adherence, Ace Inhibitors, Acid Reflux, Active Sinus Disease, Alpha 1 Antitripsin deficiency, Anxiety masquerading as Airways dz,  ABPA,  Allergy(esp in young), Aspiration (esp in elderly), Adverse effects of meds,  Active smoking or vaping, A bunch of PE's (a small clot burden can't cause this syndrome unless there is already severe underlying pulm or vascular dz with poor reserve) plus two Bs  = Bronchiectasis and Beta blocker use..and one C= CHF   Adherence is always the initial "prime suspect" and is a multilayered concern that requires a "trust but verify" approach in every patient - starting with knowing how to use medications, especially inhalers, correctly, keeping up with refills and understanding the fundamental difference between maintenance and prns vs those medications only taken for a very short course and then stopped and not refilled.  -see hfa teaching - return with all meds in hand using a trust but verify approach to confirm accurate Medication  Reconciliation The principal here is that until we are certain that the  patients are doing what we've asked, it makes no sense to ask them to do more.  Active smoking sep a/p  ? Allergy >  Prednisone 10 mg take  4 each am x 2 days,   2 each am x 2 days,  1 each am x 2 days and stop   ? Active sinus dz > augmentin x 10 days  ? Bronchiectasis :  For cough/ congestion > mucinex or mucinex dm  up to maximum of  1200 mg every 12 hours and use the flutter valve as much as you can

## 2023-09-11 NOTE — Assessment & Plan Note (Signed)
 Counseled re importance of smoking cessation but did not meet time criteria for separate billing    F/u 2 weeks to clear for neck surgery   Each maintenance medication was reviewed in detail including emphasizing most importantly the difference between maintenance and prns and under what circumstances the prns are to be triggered using an action plan format where appropriate.  Total time for H and P, chart review, counseling, reviewing hfa/neb/flutter  device(s) and generating customized AVS unique to this office visit / same day charting = 42 min pre op eval

## 2023-09-21 NOTE — Progress Notes (Deleted)
 [name] [name], female    DOB: Nov 13, 1960   MRN: [id number]   Brief patient profile:  63  yowf active smoker  referred to pulmonary clinic in [place]  07/10/2021 by Unk Garb PA for copd with symptoms starting around 2012  prev eval by Dr Waylan Haggard with GOLD 0 critieria/ AB phenotype  but requested office closer to home      History of Present Illness  07/10/2021  Pulmonary/ 1st office eval/ Demara Lover / [name]  on advair 250 Chief Complaint  Patient presents with   New Patient (Initial Visit)    Patient here for copd and emphysema. Ref by cardiologist. States she is using 4LO2 cont. At night all night and prn during day. Coughing up green/brown mucus.   Dyspnea:  [employer] shopping / no HC parking / some limited by neck from houswork Cough: every night at hs after lie down and also while asleep x one year / augmentin  best also helps nasal congestion/ coughs so hard gets fainty Sleep: bed is flat with pillows x 40 degrees with overt hb / on 02  SABA use: twice daily hfa/ neb once every 2 weeks Rec Plan A = Automatic = Always=    Symbicort  80 Take 2 puffs first thing in am and then another 2 puffs about 12 hours later.  Work on inhaler technique:  Plan B = Backup (to supplement plan A, not to replace it) Only use your albuterol  inhaler as a rescue medication Plan C = Crisis (instead of Plan B but only if Plan B stops working) - only use your albuterol  nebulizer if you first try Plan B  For cough /congestion > mucinex  dm 1200 mg twice daily = 2400 mg total per 24 hours Prednisone  10 mg take  4 each am x 2 days,   2 each am x 2 days,  1 each am x 2 days and stop  Augmentin  875 mg take one pill twice daily  X 10 days  Pantoprazole  (protonix ) 40 mg   Take  30-60 min before first meal of the day and Pepcid  (famotidine )  20 mg after supper until return to office  GERD diet reviewed, bed blocks rec  The key is to stop smoking completely before smoking completely stops  you!  Please schedule a follow up office visit in 6 weeks, call sooner if needed with all medications /inhalers/ solutions in hand    09/10/2023  f/u ov/[place] office/Simya Tercero re: AB maint on symb 80  did not  bring meds / did not return as requested, and now needing clearance for neck surgery which she was forced to cancel due to resp cc / still smoking but cutting dwon  Chief Complaint  Patient presents with   Medical Clearance    Pt is here for surgery clearance , she also is having coughing congestion  she is taking doxcy. 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 Thick/ assoc with nasal congestion same mucus  Sleeping: bed with bunch of pillows or recliner x months   resp cc  SABA use: puff sev per day  02: prn  Rec Plan A = Automatic = Always=    Symbicort  80 (or breztri )  Take 2 puffs first thing in am and then another 2 puffs about 12 hours later.  Work on inhaler technique:   Plan B = Backup (to supplement plan A, not to replace it) Only use your  albuterol  inhaler as a rescue medication Plan C = Crisis (instead of Plan B but only if Plan B stops working) - only use your albuterol  nebulizer if you first try Plan B and it fails to help > For cough/ congestion > mucinex  or mucinex  dm  up to maximum of  1200 mg every 12 hours and use the flutter valve as much as you can   Prednisone  10 mg take  4 each am x 2 days,   2 each am x 2 days,  1 each am x 2 days and stop  Augmentin  875 mg take one pill twice daily  X 10 days  Pantoprazole  (protonix ) 40 mg   Take  30-60 min before first meal of the day and Pepcid  (famotidine )  20 mg after supper until return to office GERD diet reviewed, bed blocks rec   The key is to stop smoking completely before smoking completely stops you!   Please schedule a follow up office visit in 2  weeks, call sooner if needed with all medications /inhalers/ solutions in hand    09/25/2023  f/u ov/[place]  office/Nafisa Olds re: *** maint on *** did *** bring meds  No chief complaint on file.   Dyspnea:  *** Cough: *** Sleeping: ***   resp cc  SABA use: *** 02: ***  Lung cancer screening: ***   No obvious day to day or daytime variability or assoc excess/ purulent sputum or mucus plugs or hemoptysis or cp or chest tightness, subjective wheeze or overt sinus or hb symptoms.    Also denies any obvious fluctuation of symptoms with weather or environmental changes or other aggravating or alleviating factors except as outlined above   No unusual exposure hx or h/o childhood pna/ asthma or knowledge of premature birth.  Current Allergies, Complete Past Medical History, Past Surgical History, Family History, and Social History were reviewed in [employer] record.  ROS  The following are not active complaints unless bolded Hoarseness, sore throat, dysphagia, dental problems, itching, sneezing,  nasal congestion or discharge of excess mucus or purulent secretions, ear ache,   fever, chills, sweats, unintended wt loss or wt gain, classically pleuritic or exertional cp,  orthopnea pnd or arm/hand swelling  or leg swelling, presyncope, palpitations, abdominal pain, anorexia, nausea, vomiting, diarrhea  or change in bowel habits or change in bladder habits, change in stools or change in urine, dysuria, hematuria,  rash, arthralgias, visual complaints, headache, numbness, weakness or ataxia or problems with walking or coordination,  change in mood or  memory.        No outpatient medications have been marked as taking for the 09/25/23 encounter (Appointment) with Diamond Formica, MD.                    Past Medical History:  Diagnosis Date   Acid reflux    CAD (coronary artery disease)    S/P cath with stent in 2001   CHF (congestive heart failure) (HCC)    Collagen vascular disease (HCC)    COPD (chronic obstructive pulmonary disease) (HCC)    CVA (cerebral vascular accident)  (HCC) 1992   DU (duodenal ulcer) 09/19/2009   Hiatal hernia    EGD september 2007. normal except for small hiatal hernia. She underwent small bowel biopsy with history of diarrhea at that time. This was negative   History of colonoscopy    August 2007. She had a pedunculated polyp at 30 cm. which was hamartomatous polyp.  She  is due for 5-year followup with family history of coln cancer in a brother at age 49   History of colonoscopy    2005 revealed a linear ulcer with scar versus inflammatory process at the mid right colon, but normal terminal ileum. A biopsy of this area revealed an ulceration, but nonspecific   History of coronary angiogram    CT angiogram in 2007. negative with normal mesenteric arteries   History of kidney stones    Migraine    Pulmonary embolus (HCC) 1999   Ulcerative colitis (HCC)    Urinary frequency       Objective:    Wts   09/25/2023        ***   09/10/23 127 lb (57.6 kg)  09/03/23 129 lb 4.8 oz (58.7 kg)  04/21/23 119 lb (54 kg)     Vital signs reviewed  09/25/2023  - Note at rest 02 sats  ***% on ***   General appearance:    ***  edentulous    Min barre***      Cxr ordered 09/10/2023 > did not go     Assessment

## 2023-09-25 ENCOUNTER — Ambulatory Visit: Admitting: Internal Medicine

## 2023-10-01 ENCOUNTER — Telehealth: Payer: Self-pay | Admitting: Internal Medicine

## 2023-10-01 NOTE — Telephone Encounter (Signed)
 Patient did not complete the DG Chest 2 view requested on  09/10/23   Please advise

## 2023-10-07 NOTE — Telephone Encounter (Signed)
 Per last visit 09/10/23 the surgery was cancelled  Closing this encounter

## 2023-10-13 DIAGNOSIS — K5190 Ulcerative colitis, unspecified, without complications: Secondary | ICD-10-CM | POA: Diagnosis not present

## 2023-10-13 DIAGNOSIS — I219 Acute myocardial infarction, unspecified: Secondary | ICD-10-CM | POA: Diagnosis not present

## 2023-10-13 DIAGNOSIS — M549 Dorsalgia, unspecified: Secondary | ICD-10-CM | POA: Diagnosis not present

## 2023-10-19 NOTE — Progress Notes (Deleted)
 [name] [name], female    DOB: 30-Dec-1960   MRN: [id number]   Brief patient profile:  63  yowf active smoker  referred to pulmonary clinic in [place]  07/10/2021 by Unk Garb PA for copd with symptoms starting around 2012  prev eval by Dr Waylan Haggard with GOLD 0 critieria/ AB phenotype  but requested office closer to home      History of Present Illness  07/10/2021  Pulmonary/ 1st office eval/ [name] / [name]  on advair 250 Chief Complaint  Patient presents with   New Patient (Initial Visit)    Patient here for copd and emphysema. Ref by cardiologist. States she is using 4LO2 cont. At night all night and prn during day. Coughing up green/brown mucus.   Dyspnea:  [employer] shopping / no HC parking / some limited by neck from houswork Cough: every night at hs after lie down and also while asleep x one year / augmentin  best also helps nasal congestion/ coughs so hard gets fainty Sleep: bed is flat with pillows x 40 degrees with overt hb / on 02  SABA use: twice daily hfa/ neb once every 2 weeks Rec Plan A = Automatic = Always=    Symbicort  80 Take 2 puffs first thing in am and then another 2 puffs about 12 hours later.  Work on inhaler technique:  Plan B = Backup (to supplement plan A, not to replace it) Only use your albuterol  inhaler as a rescue medication Plan C = Crisis (instead of Plan B but only if Plan B stops working) - only use your albuterol  nebulizer if you first try Plan B  For cough /congestion > mucinex  dm 1200 mg twice daily = 2400 mg total per 24 hours Prednisone  10 mg take  4 each am x 2 days,   2 each am x 2 days,  1 each am x 2 days and stop  Augmentin  875 mg take one pill twice daily  X 10 days  Pantoprazole  (protonix ) 40 mg   Take  30-60 min before first meal of the day and Pepcid  (famotidine )  20 mg after supper until return to office  GERD diet reviewed, bed blocks rec  The key is to stop smoking completely before smoking completely stops  you!  Please schedule a follow up office visit in 6 weeks, call sooner if needed with all medications /inhalers/ solutions in hand    09/10/2023  f/u ov/[name] re: AB maint on symb 80  did not  bring meds / did not return as requested, and now needing clearance for neck surgery which she was forced to cancel due to resp cc / still smoking but cutting dwon  Chief Complaint  Patient presents with   Medical Clearance    Pt is here for surgery clearance , she also is having coughing congestion  she is taking doxcy. 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 Thick/ assoc with nasal congestion same mucus  Sleeping: bed with bunch of pillows or recliner x months   resp cc  SABA use: puff sev per day  02: prn  Rec Plan A = Automatic = Always=    Symbicort  80 (or breztri )  Take 2 puffs first thing in am and then another 2 puffs about 12 hours later.  Work on inhaler technique:   Plan B = Backup (to supplement plan A, not to replace it) Only use your  albuterol  inhaler as a rescue medication Plan C = Crisis (instead of Plan B but only if Plan B stops working) - only use your albuterol  nebulizer if you first try Plan B and it fails to help > For cough/ congestion > mucinex  or mucinex  dm  up to maximum of  1200 mg every 12 hours and use the flutter valve as much as you can   Prednisone  10 mg take  4 each am x 2 days,   2 each am x 2 days,  1 each am x 2 days and stop  Augmentin  875 mg take one pill twice daily  X 10 days  Pantoprazole  (protonix ) 40 mg   Take  30-60 min before first meal of the day and Pepcid  (famotidine )  20 mg after supper until return to office GERD diet reviewed, bed blocks rec   The key is to stop smoking completely before smoking completely stops you!   Please schedule a follow up office visit in 2  weeks, call sooner if needed with all medications /inhalers/ solutions in hand    10/20/2023  f/u ov/[name]  office/[name] re: AB maint on *** did *** bring meds  No chief complaint on file.   Dyspnea:  *** Cough: *** Sleeping: ***   resp cc  SABA use: *** 02: ***  Lung cancer screening: ***   No obvious day to day or daytime variability or assoc excess/ purulent sputum or mucus plugs or hemoptysis or cp or chest tightness, subjective wheeze or overt sinus or hb symptoms.    Also denies any obvious fluctuation of symptoms with weather or environmental changes or other aggravating or alleviating factors except as outlined above   No unusual exposure hx or h/o childhood pna/ asthma or knowledge of premature birth.  Current Allergies, Complete Past Medical History, Past Surgical History, Family History, and Social History were reviewed in [employer] [facility] record.  ROS  The following are not active complaints unless bolded Hoarseness, sore throat, dysphagia, dental problems, itching, sneezing,  nasal congestion or discharge of excess mucus or purulent secretions, ear ache,   fever, chills, sweats, unintended wt loss or wt gain, classically pleuritic or exertional cp,  orthopnea pnd or arm/hand swelling  or leg swelling, presyncope, palpitations, abdominal pain, anorexia, nausea, vomiting, diarrhea  or change in bowel habits or change in bladder habits, change in stools or change in urine, dysuria, hematuria,  rash, arthralgias, visual complaints, headache, numbness, weakness or ataxia or problems with walking or coordination,  change in mood or  memory.        Current Meds  Medication Sig   buPROPion (WELLBUTRIN SR) 150 MG 12 hr tablet Take 150 mg by mouth every morning.                    Past Medical History:  Diagnosis Date   Acid reflux    CAD (coronary artery disease)    S/P cath with stent in 2001   CHF (congestive heart failure) (HCC)    Collagen vascular disease (HCC)    COPD (chronic obstructive pulmonary disease) (HCC)    CVA (cerebral vascular accident) (HCC)  1992   DU (duodenal ulcer) 09/19/2009   Hiatal hernia    EGD september 2007. normal except for small hiatal hernia. She underwent small bowel biopsy with history of diarrhea at that time. This was negative   History of colonoscopy    August 2007. She had a pedunculated polyp at 30 cm.  which was hamartomatous polyp. She  is due for 5-year followup with family history of coln cancer in a brother at age 38   History of colonoscopy    2005 revealed a linear ulcer with scar versus inflammatory process at the mid right colon, but normal terminal ileum. A biopsy of this area revealed an ulceration, but nonspecific   History of coronary angiogram    CT angiogram in 2007. negative with normal mesenteric arteries   History of kidney stones    Migraine    Pulmonary embolus (HCC) 1999   Ulcerative colitis (HCC)    Urinary frequency       Objective:    Wts   10/20/2023        ***   09/10/23 127 lb (57.6 kg)  09/03/23 129 lb 4.8 oz (58.7 kg)  04/21/23 119 lb (54 kg)     Vital signs reviewed  10/20/2023  - Note at rest 02 sats  ***% on ***   General appearance:    ***  edentulous    Min barre***      Cxr ordered 09/10/2023 > did not go     Assessment

## 2023-10-20 ENCOUNTER — Encounter: Payer: Self-pay | Admitting: Internal Medicine

## 2023-10-20 ENCOUNTER — Ambulatory Visit: Admitting: Internal Medicine

## 2023-10-21 NOTE — Progress Notes (Unsigned)
 [name] [name], female    DOB: [birth year]   MRN: [id number]   Brief patient profile:  63 yowf active smoker  referred to pulmonary clinic in [place]  07/10/2021 by Unk Garb PA for copd with symptoms starting around 2012  prev eval by Dr Waylan Haggard with GOLD 0 criteria/ AB phenotype  but requested office closer to home     History of Present Illness  07/10/2021  Pulmonary/ 1st office eval/ [name] / [name]  on advair 250 Chief Complaint  Patient presents with   New Patient (Initial Visit)    Patient here for copd and emphysema. Ref by cardiologist. States she is using 4LO2 cont. At night all night and prn during day. Coughing up green/brown mucus.   Dyspnea:  [employer] shopping / no HC parking / some limited by neck from houswork Cough: every night at hs after lie down and also while asleep x one year / augmentin  best also helps nasal congestion/ coughs so hard gets fainty Sleep: bed is flat with pillows x 40 degrees with overt hb / on 02  SABA use: twice daily hfa/ neb once every 2 weeks Rec Plan A = Automatic = Always=    Symbicort  80 Take 2 puffs first thing in am and then another 2 puffs about 12 hours later.  Work on inhaler technique:  Plan B = Backup (to supplement plan A, not to replace it) Only use your albuterol  inhaler as a rescue medication Plan C = Crisis (instead of Plan B but only if Plan B stops working) - only use your albuterol  nebulizer if you first try Plan B  For cough /congestion > mucinex  dm 1200 mg twice daily = 2400 mg total per 24 hours Prednisone  10 mg take  4 each am x 2 days,   2 each am x 2 days,  1 each am x 2 days and stop  Augmentin  875 mg take one pill twice daily  X 10 days  Pantoprazole  (protonix ) 40 mg   Take  30-60 min before first meal of the day and Pepcid  (famotidine )  20 mg after supper until return to office  GERD diet reviewed, bed blocks rec  The key is to stop smoking completely before smoking completely stops you!      09/10/2023  f/u ov/[name] office/[name] re: AB maint on symb 80  did not  bring meds / did not return as requested, and now needing clearance for neck surgery which she was forced to cancel due to resp cc / still smoking but cutting dwon  Chief Complaint  Patient presents with   Medical Clearance    Pt is here for surgery clearance , she also is having coughing congestion  she is taking doxcy. 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 Thick/ assoc with nasal congestion same mucus  Sleeping: bed with bunch of pillows or recliner x months   resp cc  SABA use: puff sev per day  02: prn  Rec Plan A = Automatic = Always=    Symbicort  80 (or breztri )  Take 2 puffs first thing in am and then another 2 puffs about 12 hours later.  Work on inhaler technique:   Plan B = Backup (to supplement plan A, not to replace it) Only use your albuterol  inhaler as a rescue medication Plan C = Crisis (instead of Plan B but only if Plan B stops working) - only  use your albuterol  nebulizer if you first try Plan B and it fails to help > For cough/ congestion > mucinex  or mucinex  dm  up to maximum of  1200 mg every 12 hours and use the flutter valve as much as you can   Prednisone  10 mg take  4 each am x 2 days,   2 each am x 2 days,  1 each am x 2 days and stop  Augmentin  875 mg take one pill twice daily  X 10 days  Pantoprazole  (protonix ) 40 mg   Take  30-60 min before first meal of the day and Pepcid  (famotidine )  20 mg after supper until return to office GERD diet reviewed, bed blocks rec   The key is to stop smoking completely before smoking completely stops you!     10/22/2023  f/u ov/[name] office/[name] re: AB maint on Symbicort  80 did  bring meds - was much better transiently p last round of pred and augmentin  x one week then worse again/ no change active smoking  Chief Complaint  Patient presents with   Results   COPD   Dyspnea:  pushing buggy at  [employer] vs [employer] Cough: worse am thick gey Sleeping: flat bed propped up on a bunch of pillows s  resp cc  SABA use: twice a day albuterol  hfa  02: concentrator is broken    No obvious day to day or daytime variability or assoc   mucus plugs or hemoptysis or cp or chest tightness, subjective wheeze or overt sinus or hb symptoms.    Also denies any obvious fluctuation of symptoms with weather or environmental changes or other aggravating or alleviating factors except as outlined above   No unusual exposure hx or h/o childhood pna/ asthma or knowledge of premature birth.  Current Allergies, Complete Past Medical History, Past Surgical History, Family History, and Social History were reviewed in [employer] [facility] record.  ROS  The following are not active complaints unless bolded Hoarseness, sore throat, dysphagia, dental problems, itching, sneezing,  nasal congestion or discharge of excess mucus or purulent secretions, ear ache,  fever, chills, sweats, unintended wt loss or wt gain, classically pleuritic or exertional cp,  orthopnea pnd or arm/hand swelling  or leg swelling, presyncope, palpitations, abdominal pain, anorexia, nausea, vomiting, diarrhea  or change in bowel habits or change in bladder habits, change in stools or change in urine, dysuria, hematuria,  rash, arthralgias, visual complaints, headache, numbness, weakness or ataxia or problems with walking or coordination,  change in mood or  memory.        Current Meds  Medication Sig   albuterol  (PROVENTIL  HFA;VENTOLIN  HFA) 108 (90 BASE) MCG/ACT inhaler Inhale 2 puffs into the lungs every 6 (six) hours as needed for shortness of breath or wheezing.   albuterol  (PROVENTIL ) (2.5 MG/3ML) 0.083% nebulizer solution Take 3 mLs (2.5 mg total) by nebulization every 4 (four) hours as needed for wheezing or shortness of breath.   albuterol  (PROVENTIL ) (2.5 MG/3ML) 0.083% nebulizer solution Take 3 mLs (2.5 mg total)  by nebulization every 4 (four) hours as needed.        aspirin  EC 81 MG tablet Take 81 mg by mouth daily. Swallow whole.   budeson-glycopyrrolate-formoterol  (BREZTRI  AEROSPHERE) 160-9-4.8 MCG/ACT AERO Inhale 2 puffs into the lungs in the morning and at bedtime.   budesonide -formoterol  (SYMBICORT ) 80-4.5 MCG/ACT inhaler Take 2 puffs first thing in am and then another 2 puffs about 12 hours later.  buprenorphine  (SUBUTEX ) 8 MG SUBL SL tablet Place 8 mg under the tongue in the morning, at noon, in the evening, and at bedtime.   buPROPion (WELLBUTRIN SR) 150 MG 12 hr tablet Take 150 mg by mouth every morning.   famotidine  (PEPCID ) 20 MG tablet One after supper   gabapentin  (NEURONTIN ) 300 MG capsule Take 300 mg by mouth at bedtime.   guaiFENesin  (MUCINEX ) 600 MG 12 hr tablet Take 1 tablet (600 mg total) by mouth 2 (two) times daily.   ondansetron  (ZOFRAN -ODT) 8 MG disintegrating tablet Take 8 mg by mouth every 6 (six) hours as needed for vomiting or nausea.   OXYGEN  Inhale 4 L into the lungs continuous as needed (shortness of breath).   pantoprazole  (PROTONIX ) 40 MG tablet Take 1 tablet (40 mg total) by mouth daily. Take 30-60 min before first meal of the day         promethazine  (PHENERGAN ) 25 MG tablet Take 25 mg by mouth every 6 (six) hours as needed.   QUEtiapine  (SEROQUEL ) 100 MG tablet Take 100 mg by mouth at bedtime.                    Past Medical History:  Diagnosis Date   Acid reflux    CAD (coronary artery disease)    S/P cath with stent in 2001   CHF (congestive heart failure) (HCC)    Collagen vascular disease (HCC)    COPD (chronic obstructive pulmonary disease) (HCC)    CVA (cerebral vascular accident) (HCC) 1992   DU (duodenal ulcer) 09/19/2009   Hiatal hernia    EGD september 2007. normal except for small hiatal hernia. She underwent small bowel biopsy with history of diarrhea at that time. This was negative   History of colonoscopy    August 2007. She had a  pedunculated polyp at 30 cm. which was hamartomatous polyp. She  is due for 5-year followup with family history of coln cancer in a brother at age 69   History of colonoscopy    2005 revealed a linear ulcer with scar versus inflammatory process at the mid right colon, but normal terminal ileum. A biopsy of this area revealed an ulceration, but nonspecific   History of coronary angiogram    CT angiogram in 2007. negative with normal mesenteric arteries   History of kidney stones    Migraine    Pulmonary embolus (HCC) 1999   Ulcerative colitis (HCC)    Urinary frequency       Objective:    Wts   10/22/2023        126   09/10/23 127 lb (57.6 kg)  09/03/23 129 lb 4.8 oz (58.7 kg)  04/21/23 119 lb (54 kg)     Vital signs reviewed  10/22/2023  - Note at rest 02 sats  89% on RA   General appearance:    chronically ill amb wf/ congested cough     HEENT : Oropharynx  clear /edentulous   Nasal turbinates nl    NECK :  without  apparent JVD/ palpable Nodes/TM    LUNGS: no acc muscle use,  Min barrel  contour chest wall with bilateral  junky sounding insp /exp rhonchi and  without cough on insp or exp maneuvers and min  Hyperresonant  to  percussion bilaterally    CV:  RRR  no s3 or murmur or increase in P2, and no edema   ABD:  soft and nontender with pos end  insp Hoover's  in the supine position.  No bruits or organomegaly appreciated   MS:  Nl gait/ ext warm without deformities Or obvious joint restrictions  calf tenderness, cyanosis or clubbing     SKIN: warm and dry without lesions    NEURO:  alert, approp, nl sensorium with  no motor or cerebellar deficits apparent.                 Assessment

## 2023-10-22 ENCOUNTER — Encounter: Payer: Self-pay | Admitting: Internal Medicine

## 2023-10-22 ENCOUNTER — Ambulatory Visit: Admitting: Internal Medicine

## 2023-10-22 ENCOUNTER — Telehealth: Payer: Self-pay | Admitting: Internal Medicine

## 2023-10-22 VITALS — BP 122/78 | HR 77 | Ht 64.0 in | Wt 126.2 lb

## 2023-10-22 DIAGNOSIS — F17210 Nicotine dependence, cigarettes, uncomplicated: Secondary | ICD-10-CM

## 2023-10-22 DIAGNOSIS — G4734 Idiopathic sleep related nonobstructive alveolar hypoventilation: Secondary | ICD-10-CM | POA: Diagnosis not present

## 2023-10-22 DIAGNOSIS — J449 Chronic obstructive pulmonary disease, unspecified: Secondary | ICD-10-CM | POA: Diagnosis not present

## 2023-10-22 MED ORDER — PREDNISONE 10 MG PO TABS: ORAL_TABLET | ORAL | 0 refills | Status: DC

## 2023-10-22 MED ORDER — AMOXICILLIN-POT CLAVULANATE 875-125 MG PO TABS: 1.0000 | ORAL_TABLET | Freq: Two times a day (BID) | ORAL | 0 refills | Status: DC

## 2023-10-22 NOTE — Assessment & Plan Note (Signed)
 Active smoker PFT's  10/05/19 FEV1  2.10  ( 85% )  ratio 0.77  p 2 % improvement from saba p 0 prior to study with DLCO  14.86 (76%) corrects to 3.42 (80%)  for alv volume and FV curve min concavity   - 07/10/2021 try symbicort  80 2bid and max gerd rx  - 09/10/2023 flutter valve training  - 10/22/2023  After extensive coaching inhaler device,  effectiveness =    75% (short Ti) >>> continue symbicort  80  2 bid and pred x 8 days/ augmentin  x 10 days start one week preop and stop all smoking

## 2023-10-22 NOTE — Patient Instructions (Addendum)
 For cough/ congestion > mucinex  or mucinex  dm  up to maximum of  1200 mg every 12 hours and use the flutter valve as much as you can     Once you know the date of surgery, go ahead and start these at least 7 days pre op.  Prednisone  10 mg Take 4 for two days three for two days two for two days one for two days   Augmentin  875 mg take one pill twice daily  X 10 days - take at breakfast and supper with large glass of water.  It would help reduce the usual side effects (diarrhea and yeast infections) if you ate cultured yogurt at lunch.   Pantoprazole  (protonix ) 40 mg   Take  30-60 min before first meal of the day and Pepcid  (famotidine )  20 mg after supper until return to office - this is the best way to tell whether stomach acid is contributing to your problem.    The key is to stop smoking completely before smoking completely stops you!  For smoking cessation classes call [phone]      Please schedule a follow up visit in 3 months but call sooner if needed

## 2023-10-22 NOTE — Assessment & Plan Note (Addendum)
Counseled re importance of smoking cessation but did not meet time criteria for separate billing            Each maintenance medication was reviewed in detail including emphasizing most importantly the difference between maintenance and prns and under what circumstances the prns are to be triggered using an action plan format where appropriate.  Total time for H and P, chart review, counseling, reviewing hfa device(s) and generating customized AVS unique to this office visit / same day charting  > 30 min       

## 2023-10-22 NOTE — Telephone Encounter (Signed)
 After chart review I realized she needs an ONO RA to qualify for new concentrator, she does not require amb 02   So   1) I ordered ono on RA  2)  noted also I had ordered CXR last ov not done  that will need to be done before I can officially approve her for surgery

## 2023-10-22 NOTE — Assessment & Plan Note (Signed)
 On 02 hs since Oct 2022  - 10/22/2023   Walked on RA  x  3  lap(s) =  approx 450   ft  @ mod pace, stopped due to end of study  with lowest 02 sats 93% and tired, mild chest tight (chronic)   - ONO on RA 10/22/2023 >>>   Will need to requalify for home concentrator as does not require amb 02

## 2023-10-27 DIAGNOSIS — I219 Acute myocardial infarction, unspecified: Secondary | ICD-10-CM | POA: Diagnosis not present

## 2023-10-27 DIAGNOSIS — M549 Dorsalgia, unspecified: Secondary | ICD-10-CM | POA: Diagnosis not present

## 2023-10-27 DIAGNOSIS — K5190 Ulcerative colitis, unspecified, without complications: Secondary | ICD-10-CM | POA: Diagnosis not present

## 2023-11-14 ENCOUNTER — Other Ambulatory Visit: Payer: Self-pay

## 2023-11-14 ENCOUNTER — Emergency Department (HOSPITAL_COMMUNITY)

## 2023-11-14 ENCOUNTER — Encounter (HOSPITAL_COMMUNITY): Payer: Self-pay

## 2023-11-14 ENCOUNTER — Emergency Department (HOSPITAL_COMMUNITY)
Admission: EM | Admit: 2023-11-14 | Discharge: 2023-11-14 | Discharge status: Against Medical Advice | Source: Home / Self Care | Attending: Emergency Medicine | Admitting: Emergency Medicine

## 2023-11-14 DIAGNOSIS — R918 Other nonspecific abnormal finding of lung field: Secondary | ICD-10-CM | POA: Diagnosis not present

## 2023-11-14 DIAGNOSIS — R059 Cough, unspecified: Secondary | ICD-10-CM | POA: Diagnosis not present

## 2023-11-14 DIAGNOSIS — Z8673 Personal history of transient ischemic attack (TIA), and cerebral infarction without residual deficits: Secondary | ICD-10-CM | POA: Diagnosis not present

## 2023-11-14 DIAGNOSIS — Z5329 Procedure and treatment not carried out because of patient's decision for other reasons: Secondary | ICD-10-CM | POA: Insufficient documentation

## 2023-11-14 DIAGNOSIS — R062 Wheezing: Secondary | ICD-10-CM | POA: Insufficient documentation

## 2023-11-14 DIAGNOSIS — F17210 Nicotine dependence, cigarettes, uncomplicated: Secondary | ICD-10-CM | POA: Insufficient documentation

## 2023-11-14 DIAGNOSIS — M25511 Pain in right shoulder: Secondary | ICD-10-CM | POA: Insufficient documentation

## 2023-11-14 DIAGNOSIS — J441 Chronic obstructive pulmonary disease with (acute) exacerbation: Secondary | ICD-10-CM | POA: Diagnosis not present

## 2023-11-14 DIAGNOSIS — E876 Hypokalemia: Secondary | ICD-10-CM | POA: Insufficient documentation

## 2023-11-14 DIAGNOSIS — R040 Epistaxis: Secondary | ICD-10-CM | POA: Diagnosis not present

## 2023-11-14 DIAGNOSIS — J440 Chronic obstructive pulmonary disease with acute lower respiratory infection: Secondary | ICD-10-CM | POA: Diagnosis not present

## 2023-11-14 DIAGNOSIS — R000 Tachycardia, unspecified: Secondary | ICD-10-CM | POA: Diagnosis not present

## 2023-11-14 DIAGNOSIS — R0789 Other chest pain: Secondary | ICD-10-CM | POA: Diagnosis not present

## 2023-11-14 DIAGNOSIS — J449 Chronic obstructive pulmonary disease, unspecified: Secondary | ICD-10-CM | POA: Insufficient documentation

## 2023-11-14 DIAGNOSIS — Z7951 Long term (current) use of inhaled steroids: Secondary | ICD-10-CM | POA: Diagnosis not present

## 2023-11-14 DIAGNOSIS — Z833 Family history of diabetes mellitus: Secondary | ICD-10-CM | POA: Diagnosis not present

## 2023-11-14 DIAGNOSIS — D72829 Elevated white blood cell count, unspecified: Secondary | ICD-10-CM | POA: Insufficient documentation

## 2023-11-14 DIAGNOSIS — K219 Gastro-esophageal reflux disease without esophagitis: Secondary | ICD-10-CM | POA: Diagnosis not present

## 2023-11-14 DIAGNOSIS — R0602 Shortness of breath: Secondary | ICD-10-CM | POA: Diagnosis not present

## 2023-11-14 DIAGNOSIS — J9811 Atelectasis: Secondary | ICD-10-CM | POA: Diagnosis not present

## 2023-11-14 DIAGNOSIS — J9621 Acute and chronic respiratory failure with hypoxia: Secondary | ICD-10-CM | POA: Diagnosis not present

## 2023-11-14 DIAGNOSIS — D649 Anemia, unspecified: Secondary | ICD-10-CM | POA: Diagnosis not present

## 2023-11-14 DIAGNOSIS — Z1152 Encounter for screening for COVID-19: Secondary | ICD-10-CM | POA: Diagnosis not present

## 2023-11-14 DIAGNOSIS — G894 Chronic pain syndrome: Secondary | ICD-10-CM | POA: Diagnosis not present

## 2023-11-14 DIAGNOSIS — I2510 Atherosclerotic heart disease of native coronary artery without angina pectoris: Secondary | ICD-10-CM | POA: Diagnosis not present

## 2023-11-14 DIAGNOSIS — I509 Heart failure, unspecified: Secondary | ICD-10-CM | POA: Insufficient documentation

## 2023-11-14 DIAGNOSIS — I700 Atherosclerosis of aorta: Secondary | ICD-10-CM | POA: Diagnosis not present

## 2023-11-14 DIAGNOSIS — R112 Nausea with vomiting, unspecified: Secondary | ICD-10-CM | POA: Diagnosis not present

## 2023-11-14 DIAGNOSIS — Z7982 Long term (current) use of aspirin: Secondary | ICD-10-CM | POA: Diagnosis not present

## 2023-11-14 DIAGNOSIS — K5190 Ulcerative colitis, unspecified, without complications: Secondary | ICD-10-CM | POA: Diagnosis not present

## 2023-11-14 DIAGNOSIS — Z8249 Family history of ischemic heart disease and other diseases of the circulatory system: Secondary | ICD-10-CM | POA: Diagnosis not present

## 2023-11-14 DIAGNOSIS — J189 Pneumonia, unspecified organism: Secondary | ICD-10-CM | POA: Diagnosis not present

## 2023-11-14 DIAGNOSIS — Z955 Presence of coronary angioplasty implant and graft: Secondary | ICD-10-CM | POA: Diagnosis not present

## 2023-11-14 DIAGNOSIS — I2699 Other pulmonary embolism without acute cor pulmonale: Secondary | ICD-10-CM | POA: Diagnosis not present

## 2023-11-14 DIAGNOSIS — E871 Hypo-osmolality and hyponatremia: Secondary | ICD-10-CM | POA: Diagnosis not present

## 2023-11-14 LAB — COMPREHENSIVE METABOLIC PANEL WITH GFR
ALT: 12 U/L (ref 0–44)
AST: 16 U/L (ref 15–41)
Albumin: 3.6 g/dL (ref 3.5–5.0)
Alkaline Phosphatase: 74 U/L (ref 38–126)
Anion gap: 14 (ref 5–15)
BUN: 14 mg/dL (ref 8–23)
CO2: 25 mmol/L (ref 22–32)
Calcium: 9.1 mg/dL (ref 8.9–10.3)
Chloride: 95 mmol/L — ABNORMAL LOW (ref 98–111)
Creatinine, Ser: 0.82 mg/dL (ref 0.44–1.00)
GFR, Estimated: >60 mL/min (ref >60)
Glucose, Bld: 118 mg/dL — ABNORMAL HIGH (ref 70–99)
Potassium: 3.3 mmol/L — ABNORMAL LOW (ref 3.5–5.1)
Sodium: 134 mmol/L — ABNORMAL LOW (ref 135–145)
Total Bilirubin: 0.8 mg/dL (ref 0.0–1.2)
Total Protein: 7.5 g/dL (ref 6.5–8.1)

## 2023-11-14 LAB — BRAIN NATRIURETIC PEPTIDE: B Natriuretic Peptide: 106 pg/mL — ABNORMAL HIGH (ref 0.0–100.0)

## 2023-11-14 LAB — RESP PANEL BY RT-PCR (RSV, FLU A&B, COVID)  RVPGX2
Influenza A by PCR: NEGATIVE
Influenza B by PCR: NEGATIVE
Resp Syncytial Virus by PCR: NEGATIVE
SARS Coronavirus 2 by RT PCR: NEGATIVE

## 2023-11-14 LAB — CBC WITH DIFFERENTIAL/PLATELET
Abs Immature Granulocytes: 0.07 10*3/uL (ref 0.00–0.07)
Basophils Absolute: 0 10*3/uL (ref 0.0–0.1)
Basophils Relative: 0 %
Eosinophils Absolute: 0 10*3/uL (ref 0.0–0.5)
Eosinophils Relative: 0 %
HCT: 38.3 % (ref 36.0–46.0)
Hemoglobin: 12.6 g/dL (ref 12.0–15.0)
Immature Granulocytes: 1 %
Lymphocytes Relative: 5 %
Lymphs Abs: 0.6 10*3/uL — ABNORMAL LOW (ref 0.7–4.0)
MCH: 29.2 pg (ref 26.0–34.0)
MCHC: 32.9 g/dL (ref 30.0–36.0)
MCV: 88.7 fL (ref 80.0–100.0)
Monocytes Absolute: 1.5 10*3/uL — ABNORMAL HIGH (ref 0.1–1.0)
Monocytes Relative: 11 %
Neutro Abs: 11.6 10*3/uL — ABNORMAL HIGH (ref 1.7–7.7)
Neutrophils Relative %: 83 %
Platelets: 220 10*3/uL (ref 150–400)
RBC: 4.32 MIL/uL (ref 3.87–5.11)
RDW: 13 % (ref 11.5–15.5)
WBC: 13.8 10*3/uL — ABNORMAL HIGH (ref 4.0–10.5)
nRBC: 0 % (ref 0.0–0.2)

## 2023-11-14 LAB — URINALYSIS, ROUTINE W REFLEX MICROSCOPIC
Bilirubin Urine: NEGATIVE
Glucose, UA: NEGATIVE mg/dL
Ketones, ur: 20 mg/dL — AB
Nitrite: NEGATIVE
Protein, ur: 100 mg/dL — AB
Specific Gravity, Urine: 1.021 (ref 1.005–1.030)
pH: 5 (ref 5.0–8.0)

## 2023-11-14 LAB — LIPASE, BLOOD: Lipase: 21 U/L (ref 11–51)

## 2023-11-14 LAB — TROPONIN I (HIGH SENSITIVITY)
Troponin I (High Sensitivity): 4 ng/L (ref <18)
Troponin I (High Sensitivity): 4 ng/L (ref <18)

## 2023-11-14 MED ORDER — ONDANSETRON HCL 4 MG PO TABS: 4.0000 mg | ORAL_TABLET | ORAL | 0 refills | Status: AC | PRN

## 2023-11-14 MED ORDER — KETOROLAC TROMETHAMINE 15 MG/ML IJ SOLN
15.0000 mg | Freq: Once | INTRAMUSCULAR | Status: AC
  Administered 2023-11-14: 15 mg via INTRAVENOUS
  Filled 2023-11-14: qty 1

## 2023-11-14 MED ORDER — SODIUM CHLORIDE 0.9 % IV BOLUS
1000.0000 mL | Freq: Once | INTRAVENOUS | Status: AC
  Administered 2023-11-14: 1000 mL via INTRAVENOUS

## 2023-11-14 MED ORDER — ONDANSETRON HCL 4 MG/2ML IJ SOLN
4.0000 mg | Freq: Once | INTRAMUSCULAR | Status: AC
  Administered 2023-11-14: 4 mg via INTRAVENOUS
  Filled 2023-11-14: qty 2

## 2023-11-14 NOTE — ED Triage Notes (Addendum)
 Pt stated that she has been vomiting since Monday and is now experiencing side pain that radiates to her back. Pt wears 3 L PRN at home and is sating 91 in triage.

## 2023-11-14 NOTE — ED Provider Notes (Signed)
 [place] EMERGENCY DEPARTMENT AT [facility] Provider Note  CSN: [id number] Arrival date & time: 11/14/23 1115  Chief Complaint(s) Emesis  HPI [name] [name] is a 63 y.o. female with past medical history as below, significant for CAD, CHF, PE, UC  who presents to the ED with complaint of n/v x4 days, chest pain   Patient reports nausea and vomiting over the past 4 days.  She was vomiting multiple times a day and began having pain in her right shoulder and also into her right sided chest wall.  She had some diarrhea which did resolve.  Having some abdominal cramping which also resolved.  She normally wears 3 L nasal cannula and feels she is breathing normally.  Feels she may have had a subjective fever yesterday.  No chills.  No change in urination.   Past Medical History Past Medical History:  Diagnosis Date   Acid reflux    Arthritis    CAD (coronary artery disease)    S/P cath with stent in 2001   CHF (congestive heart failure) (HCC)    Collagen vascular disease (HCC)    COPD (chronic obstructive pulmonary disease) (HCC)    CVA (cerebral vascular accident) (HCC) 1992   DU (duodenal ulcer) 09/19/2009   Hiatal hernia    EGD september 2007. normal except for small hiatal hernia. She underwent small bowel biopsy with history of diarrhea at that time. This was negative   History of colonoscopy    August 2007. She had a pedunculated polyp at 30 cm. which was hamartomatous polyp. She  is due for 5-year followup with family history of coln cancer in a brother at age 15   History of colonoscopy    2005 revealed a linear ulcer with scar versus inflammatory process at the mid right colon, but normal terminal ileum. A biopsy of this area revealed an ulceration, but nonspecific   History of coronary angiogram    CT angiogram in 2007. negative with normal mesenteric arteries   History of kidney stones    Migraine    Pneumonia    pt states she gets pneumonia about twice a year    Pulmonary embolus (HCC) 1999   Ulcerative colitis (HCC)    Urinary frequency    Patient Active Problem List   Diagnosis Date Noted   Opioid dependence on agonist therapy (HCC) 12/24/2021   Substance abuse (HCC) 10/03/2021   Edema of right upper arm 10/03/2021   Elevated d-dimer 10/03/2021   Cervical spondylosis with radiculopathy 08/15/2021   Nocturnal hypoxemia 07/10/2021   Unstable angina (HCC)    Chest pain 04/16/2021   COPD  GOLD 0/ active smoker    Sciatica 08/25/2016   Obstructive chronic bronchitis with acute bronchitis (HCC) 06/04/2016   Community acquired pneumonia 04/09/2014   Pneumonia 04/09/2014   Tobacco use disorder 02/14/2014   Other emphysema (HCC) 01/11/2013   Dysuria 11/18/2011   Family history of colon cancer 11/18/2011   GERD 09/18/2009   Dysphagia 09/18/2009   EPIGASTRIC PAIN 09/18/2009   Cigarette smoker 09/15/2009   Migraine headache 09/15/2009   Coronary atherosclerosis 09/15/2009   Cerebral artery occlusion with cerebral infarction (HCC) 09/15/2009   WEIGHT LOSS 09/15/2009   Nausea with vomiting 09/15/2009   URINARY FREQUENCY 09/15/2009   ABDOMINAL PAIN OTHER SPECIFIED SITE 09/15/2009   PULMONARY EMBOLISM, HX OF 09/15/2009   CONSTIPATION, CHRONIC, HX OF 09/15/2009   RENAL CALCULUS, HX OF 09/15/2009   Home Medication(s) Prior to Admission medications   Medication  Sig Start Date End Date Taking? Authorizing Provider  divalproex (DEPAKOTE) 250 MG DR tablet Take 250 mg by mouth daily. 10/27/23  Yes [provider]  ondansetron  (ZOFRAN ) 4 MG tablet Take 1 tablet (4 mg total) by mouth every 4 (four) hours as needed for nausea or vomiting. 11/14/23  Yes Russella Courts A, DO  albuterol  (PROVENTIL  HFA;VENTOLIN  HFA) 108 (90 BASE) MCG/ACT inhaler Inhale 2 puffs into the lungs every 6 (six) hours as needed for shortness of breath or wheezing.    [provider]  albuterol  (PROVENTIL ) (2.5 MG/3ML) 0.083% nebulizer solution Take 3 mLs (2.5 mg  total) by nebulization every 4 (four) hours as needed for wheezing or shortness of breath. 07/10/21   Diamond Formica, MD  albuterol  (PROVENTIL ) (2.5 MG/3ML) 0.083% nebulizer solution Take 3 mLs (2.5 mg total) by nebulization every 4 (four) hours as needed. 09/10/23   Diamond Formica, MD  amoxicillin -clavulanate (AUGMENTIN ) 875-125 MG tablet Take 1 tablet by mouth 2 (two) times daily. 10/22/23   Diamond Formica, MD  aspirin  EC 81 MG tablet Take 81 mg by mouth daily. Swallow whole.    [provider]  atorvastatin  (LIPITOR) 80 MG tablet Take 1 tablet (80 mg total) by mouth daily. 04/22/23 07/21/23  Lasalle Pointer, NP  budeson-glycopyrrolate-formoterol  (BREZTRI  AEROSPHERE) 160-9-4.8 MCG/ACT AERO Inhale 2 puffs into the lungs in the morning and at bedtime. 09/10/23   Diamond Formica, MD  budesonide -formoterol  (SYMBICORT ) 80-4.5 MCG/ACT inhaler Take 2 puffs first thing in am and then another 2 puffs about 12 hours later. 07/10/21   Diamond Formica, MD  buprenorphine  (SUBUTEX ) 8 MG SUBL SL tablet Place 8 mg under the tongue in the morning, at noon, in the evening, and at bedtime. 08/18/23   [provider]  buPROPion (WELLBUTRIN SR) 150 MG 12 hr tablet Take 150 mg by mouth every morning. 09/15/23   [provider]  famotidine  (PEPCID ) 20 MG tablet One after supper 09/10/23   Diamond Formica, MD  gabapentin  (NEURONTIN ) 300 MG capsule Take 300 mg by mouth at bedtime. 10/12/20   [provider]  guaiFENesin  (MUCINEX ) 600 MG 12 hr tablet Take 1 tablet (600 mg total) by mouth 2 (two) times daily. 04/11/14   Gwendalyn Lemma, MD  ondansetron  (ZOFRAN -ODT) 8 MG disintegrating tablet Take 8 mg by mouth every 6 (six) hours as needed for vomiting or nausea. 06/26/21   [provider]  OXYGEN  Inhale 4 L into the lungs continuous as needed (shortness of breath).    [provider]  pantoprazole  (PROTONIX ) 40 MG tablet Take 1 tablet (40 mg total) by mouth daily. Take 30-60 min  before first meal of the day 09/10/23   Diamond Formica, MD  predniSONE  (DELTASONE ) 10 MG tablet Take 4 for two days three for two days two for two days one for two days T 10/22/23   Diamond Formica, MD  promethazine  (PHENERGAN ) 25 MG tablet Take 25 mg by mouth every 6 (six) hours as needed. 08/20/23   [provider]  QUEtiapine  (SEROQUEL ) 100 MG tablet Take 100 mg by mouth at bedtime. 11/06/20   [provider]  Past Surgical History Past Surgical History:  Procedure Laterality Date   ABDOMINAL HYSTERECTOMY  1985   ANTERIOR CERVICAL DECOMP/DISCECTOMY FUSION N/A 08/15/2021   Procedure: Cervical Three-Four, Cervical Four-Five, Cervical Five-Six  Anterior cervical decompression/discectomy/fusion/interbody prosthesis/plate/screws;  Surgeon: Garry Kansas, MD;  Location: [facility] OR;  Service: Neurosurgery;  Laterality: N/A;  Cervical Three-Four, Cervical Four-Five, Cervical Five-Six  Anterior cervical decompression/discectomy/fusion/interbody prosthesis/plate/screws   APPENDECTOMY     CESAREAN SECTION  1979   CHOLECYSTECTOMY  2021   COLONOSCOPY  01/2006   Rourk-pedunculated polyp 30 cm hamartomatous polyp,   COLONOSCOPY  2005   Lineal ulcer or mid right colon, normal TI nonspecific biopsy   CORONARY ANGIOPLASTY WITH STENT PLACEMENT  2001   [facility], [place]   ESOPHAGOGASTRODUODENOSCOPY  09/19/2009   Rourk -2 duodenal bulbar ulcers, small HH otherwise normal/tiny distal esophageal erosions with mild erosive reflux   ESOPHAGOGASTRODUODENOSCOPY  03/2006   Rourk-Hiatal hernia, negative small bowel biopsy   LEFT HEART CATH AND CORONARY ANGIOGRAPHY N/A 04/18/2021   Procedure: LEFT HEART CATH AND CORONARY ANGIOGRAPHY;  Surgeon: Kyra Phy, MD;  Location: [facility] INVASIVE CV LAB;  Service: Cardiovascular;  Laterality: N/A;   SPINE SURGERY      TONSILLECTOMY  1986   TUBAL LIGATION     Unilateral oophorectomy with hysterectomy     Family History Family History  Problem Relation Age of Onset   Colon cancer Brother 8       died 1 year later   Heart attack Mother 36   Diabetes Sister     Social History Social History   Tobacco Use   Smoking status: Every Day    Current packs/day: 0.50    Average packs/day: 0.5 packs/day for 35.0 years (17.5 ttl pk-yrs)    Types: Cigarettes    Passive exposure: Current   Smokeless tobacco: Never  Vaping Use   Vaping status: Never Used  Substance Use Topics   Alcohol use: No   Drug use: No   Allergies Codeine , Hydrocodone, Sulfa antibiotics, and Hydrocodone-acetaminophen   Review of Systems A thorough review of systems was obtained and all systems are negative except as noted in the HPI and PMH.   Physical Exam Vital Signs  I have reviewed the triage vital signs BP (!) 130/102   Pulse (!) 107   Temp 100.2 F (37.9 C) (Oral)   Resp (!) 24   Ht 5\' 4"  (1.626 m)   Wt 59 kg   SpO2 93%   BMI 22.31 kg/m  Physical Exam Vitals and nursing note reviewed.  Constitutional:      General: She is not in acute distress.    Appearance: Normal appearance.  HENT:     Head: Normocephalic and atraumatic.     Right Ear: External ear normal.     Left Ear: External ear normal.     Nose: Nose normal.     Mouth/Throat:     Mouth: Mucous membranes are moist.  Eyes:     General: No scleral icterus.       Right eye: No discharge.        Left eye: No discharge.  Cardiovascular:     Rate and Rhythm: Regular rhythm. Tachycardia present.     Pulses: Normal pulses.     Heart sounds: Normal heart sounds.  Pulmonary:     Effort: Pulmonary effort is normal. No respiratory distress.     Breath sounds: No stridor. Wheezing present.     Comments: Trace wheeze Abdominal:  General: Abdomen is flat. There is no distension.     Palpations: Abdomen is soft.     Tenderness: There is no abdominal  tenderness.  Musculoskeletal:       Arms:     Cervical back: No rigidity.       Back:     Right lower leg: No edema.     Left lower leg: No edema.  Skin:    General: Skin is warm and dry.     Capillary Refill: Capillary refill takes less than 2 seconds.  Neurological:     Mental Status: She is alert.  Psychiatric:        Mood and Affect: Mood normal.        Behavior: Behavior normal. Behavior is cooperative.     ED Results and Treatments Labs (all labs ordered are listed, but only abnormal results are displayed) Labs Reviewed  CBC WITH DIFFERENTIAL/PLATELET - Abnormal; Notable for the following components:      Result Value   WBC 13.8 (*)    Neutro Abs 11.6 (*)    Lymphs Abs 0.6 (*)    Monocytes Absolute 1.5 (*)    All other components within normal limits  COMPREHENSIVE METABOLIC PANEL WITH GFR - Abnormal; Notable for the following components:   Sodium 134 (*)    Potassium 3.3 (*)    Chloride 95 (*)    Glucose, Bld 118 (*)    All other components within normal limits  URINALYSIS, ROUTINE W REFLEX MICROSCOPIC - Abnormal; Notable for the following components:   APPearance HAZY (*)    Hgb urine dipstick MODERATE (*)    Ketones, ur 20 (*)    Protein, ur 100 (*)    Leukocytes,Ua TRACE (*)    Bacteria, UA RARE (*)    All other components within normal limits  BRAIN NATRIURETIC PEPTIDE - Abnormal; Notable for the following components:   B Natriuretic Peptide 106.0 (*)    All other components within normal limits  RESP PANEL BY RT-PCR (RSV, FLU A&B, COVID)  RVPGX2  LIPASE, BLOOD  TROPONIN I (HIGH SENSITIVITY)  TROPONIN I (HIGH SENSITIVITY)                                                                                                                          Radiology DG Chest Portable 1 View Result Date: 11/14/2023 CLINICAL DATA:  Cough. EXAM: PORTABLE CHEST 1 VIEW COMPARISON:  Chest radiograph dated 09/23/2022. FINDINGS: Bibasilar atelectasis. No focal  consolidation, pleural effusion or pneumothorax. Cardiac silhouette is within normal limits. Atherosclerotic calcification of the aorta. No acute osseous pathology. IMPRESSION: Bibasilar atelectasis. No focal consolidation. Electronically Signed   By: Angus Bark M.D.   On: 11/14/2023 11:58    Pertinent labs & imaging results that were available during my care of the patient were reviewed by me and considered in my medical decision making (see MDM for details).  Medications Ordered in ED Medications  sodium chloride  0.9 % bolus  1,000 mL (0 mLs Intravenous Stopped 11/14/23 1405)  ondansetron  (ZOFRAN ) injection 4 mg (4 mg Intravenous Given 11/14/23 1225)  ketorolac  (TORADOL ) 15 MG/ML injection 15 mg (15 mg Intravenous Given 11/14/23 1406)                                                                                                                                     Procedures Procedures  (including critical care time)  Medical Decision Making / ED Course    Medical Decision Making:    CATRICIA WILKINSON is a 63 y.o. female with past medical history as below, significant for CAD, CHF, PE, UC  who presents to the ED with complaint of n/v x4 days, chest pain . The complaint involves an extensive differential diagnosis and also carries with it a high risk of complications and morbidity.  Serious etiology was considered. Ddx includes but is not limited to: Differential diagnosis includes but is not exclusive to acute cholecystitis, intrathoracic causes for epigastric abdominal pain, gastritis, duodenitis, pancreatitis, small bowel or large bowel obstruction, abdominal aortic aneurysm, hernia, gastritis, ACS, pneumonia, PE etc.   Complete initial physical exam performed, notably the patient was in initially had some difficulty breathing she walked from her car to the treatment area not on her oxygen  when she was able to go back on her 3 L nasal cannula her breathing greatly improved. Reviewed  and confirmed nursing documentation for past medical history, family history, social history.  Vital signs reviewed.     Brief summary:  63 year old female history as above here with nausea and vomiting x 4 days, shoulder and chest pain that began after the vomiting. Nausea and vomiting has improved.  She had diarrhea previously which also improved.  Abd cramping is improved.  Still having lingering shoulder pain and chest wall pain that began after multiple episodes of forceful vomiting. Initial labs ordered and reviewed. Patient reports that she would like to leave, has to go pick up family member.  Reports that she would like a refill on Zofran  and some pain medicine and then she will come back later after she gets her family member taking care of.  Did recommend further workup before she leaves including advanced imaging, further labs and potential admission.  Patient is adamant that she needs to leave and will come back later tonight.  Clinical Course as of 11/14/23 1408  Fri Nov 14, 2023  1402 WBC(!): 13.8 Likely secondary to vomiting, patient also has low-grade fever.  Concern for possible underlying infection [SG]  1403 Potassium(!): 3.3 likely due to volume loss/vomiting [SG]    Clinical Course User Index [SG] Teddi Favors, DO    Will send Rx for zofran  to her pharmacy  The patient has requested to leave the ED against medical advice. I believe this patient is of sound mind and medical decision making capacity to refuse medical care. The patient is responding and asking  questions appropriately. The patient is oriented to person, place and time. The patient is not psychotic, delusional, suicidal, homicidal or hallucinating. The patient demonstrates a normal mental capacity to make decisions regarding their healthcare. The patient is clinically sober and does not appear to be under the influence of any illicit drugs at this time. The patient has been advised of the risks, in layman  terms, of leaving AMA which include, but are not limited to death, coma, permanent disability, loss of current lifestyle, delay in diagnosis. Alternatives have been offered - the patient remains steadfast in their wish to leave. The patient has been advised that should they change their mind they are welcome to return to this hospital, or any other, at any time. The patient understands that in no way does an AMA discharge mean that I do not want them to have the best medical care available. To this end, I have offered appropriate prescriptions, referrals, and discharge instructions. The patient did sign AMA paperwork. The above discussion was witnessed by another member of staff.              Additional history obtained: -Additional history obtained from na -External records from outside source obtained and reviewed including: Chart review including previous notes, labs, imaging, consultation notes including  Pdmp Prior labs   Lab Tests: -I ordered, reviewed, and interpreted labs.   The pertinent results include:   Labs Reviewed  CBC WITH DIFFERENTIAL/PLATELET - Abnormal; Notable for the following components:      Result Value   WBC 13.8 (*)    Neutro Abs 11.6 (*)    Lymphs Abs 0.6 (*)    Monocytes Absolute 1.5 (*)    All other components within normal limits  COMPREHENSIVE METABOLIC PANEL WITH GFR - Abnormal; Notable for the following components:   Sodium 134 (*)    Potassium 3.3 (*)    Chloride 95 (*)    Glucose, Bld 118 (*)    All other components within normal limits  URINALYSIS, ROUTINE W REFLEX MICROSCOPIC - Abnormal; Notable for the following components:   APPearance HAZY (*)    Hgb urine dipstick MODERATE (*)    Ketones, ur 20 (*)    Protein, ur 100 (*)    Leukocytes,Ua TRACE (*)    Bacteria, UA RARE (*)    All other components within normal limits  BRAIN NATRIURETIC PEPTIDE - Abnormal; Notable for the following components:   B Natriuretic Peptide 106.0 (*)     All other components within normal limits  RESP PANEL BY RT-PCR (RSV, FLU A&B, COVID)  RVPGX2  LIPASE, BLOOD  TROPONIN I (HIGH SENSITIVITY)  TROPONIN I (HIGH SENSITIVITY)    Notable for as above  EKG   EKG Interpretation Date/Time:  Friday Nov 14 2023 11:49:18 EDT Ventricular Rate:  105 PR Interval:  127 QRS Duration:  99 QT Interval:  365 QTC Calculation: 483 R Axis:   82  Text Interpretation: Sinus tachycardia Atrial premature complex Borderline right axis deviation Borderline T wave abnormalities Confirmed by Russella Courts ([id number]) on 11/14/2023 2:04:51 PM         Imaging Studies ordered: I ordered imaging studies including cxr I independently visualized the following imaging with scope of interpretation limited to determining acute life threatening conditions related to emergency care; findings noted above I agree with the radiologist interpretation If any imaging was obtained with contrast I closely monitored patient for any possible adverse reaction a/w contrast administration in the emergency department   Medicines  ordered and prescription drug management: Meds ordered this encounter  Medications   sodium chloride  0.9 % bolus 1,000 mL   ondansetron  (ZOFRAN ) injection 4 mg   ketorolac  (TORADOL ) 15 MG/ML injection 15 mg   ondansetron  (ZOFRAN ) 4 MG tablet    Sig: Take 1 tablet (4 mg total) by mouth every 4 (four) hours as needed for nausea or vomiting.    Dispense:  5 tablet    Refill:  0    -I have reviewed the patients home medicines and have made adjustments as needed   Consultations Obtained: na   Cardiac Monitoring: The patient was maintained on a cardiac monitor.  I personally viewed and interpreted the cardiac monitored which showed an underlying rhythm of: nsr Continuous pulse oximetry interpreted by myself, 96% on 3L.    Social Determinants of Health:  Diagnosis or treatment significantly limited by social determinants of health: current  smoker   Reevaluation: After the interventions noted above, I reevaluated the patient and found that they have improved  Co morbidities that complicate the patient evaluation  Past Medical History:  Diagnosis Date   Acid reflux    Arthritis    CAD (coronary artery disease)    S/P cath with stent in 2001   CHF (congestive heart failure) (HCC)    Collagen vascular disease (HCC)    COPD (chronic obstructive pulmonary disease) (HCC)    CVA (cerebral vascular accident) (HCC) 1992   DU (duodenal ulcer) 09/19/2009   Hiatal hernia    EGD september 2007. normal except for small hiatal hernia. She underwent small bowel biopsy with history of diarrhea at that time. This was negative   History of colonoscopy    August 2007. She had a pedunculated polyp at 30 cm. which was hamartomatous polyp. She  is due for 5-year followup with family history of coln cancer in a brother at age 87   History of colonoscopy    2005 revealed a linear ulcer with scar versus inflammatory process at the mid right colon, but normal terminal ileum. A biopsy of this area revealed an ulceration, but nonspecific   History of coronary angiogram    CT angiogram in 2007. negative with normal mesenteric arteries   History of kidney stones    Migraine    Pneumonia    pt states she gets pneumonia about twice a year   Pulmonary embolus (HCC) 1999   Ulcerative colitis (HCC)    Urinary frequency       Dispostion: Disposition decision including need for hospitalization was considered, and patient Left against medical advice    Final Clinical Impression(s) / ED Diagnoses Final diagnoses:  Nausea and vomiting, unspecified vomiting type        Teddi Favors, DO 11/14/23 1408

## 2023-11-14 NOTE — Discharge Instructions (Addendum)
 It was a pleasure caring for you today in the emergency department.  Please return to the emergency department if you would like to complete your care.

## 2023-11-17 ENCOUNTER — Other Ambulatory Visit: Payer: Self-pay

## 2023-11-17 ENCOUNTER — Encounter (HOSPITAL_COMMUNITY): Payer: Self-pay | Admitting: *Deleted

## 2023-11-17 ENCOUNTER — Emergency Department (HOSPITAL_COMMUNITY)

## 2023-11-17 ENCOUNTER — Inpatient Hospital Stay (HOSPITAL_COMMUNITY)
Admission: EM | Admit: 2023-11-17 | Discharge: 2023-11-26 | DRG: 193 | Disposition: A | Discharge status: Home Health | Attending: Family Medicine | Admitting: Family Medicine

## 2023-11-17 DIAGNOSIS — Z885 Allergy status to narcotic agent status: Secondary | ICD-10-CM

## 2023-11-17 DIAGNOSIS — J449 Chronic obstructive pulmonary disease, unspecified: Secondary | ICD-10-CM

## 2023-11-17 DIAGNOSIS — I2699 Other pulmonary embolism without acute cor pulmonale: Secondary | ICD-10-CM | POA: Diagnosis present

## 2023-11-17 DIAGNOSIS — Z0389 Encounter for observation for other suspected diseases and conditions ruled out: Secondary | ICD-10-CM | POA: Diagnosis not present

## 2023-11-17 DIAGNOSIS — F17200 Nicotine dependence, unspecified, uncomplicated: Secondary | ICD-10-CM | POA: Diagnosis present

## 2023-11-17 DIAGNOSIS — R918 Other nonspecific abnormal finding of lung field: Secondary | ICD-10-CM | POA: Diagnosis not present

## 2023-11-17 DIAGNOSIS — Z7982 Long term (current) use of aspirin: Secondary | ICD-10-CM | POA: Diagnosis not present

## 2023-11-17 DIAGNOSIS — Z8249 Family history of ischemic heart disease and other diseases of the circulatory system: Secondary | ICD-10-CM

## 2023-11-17 DIAGNOSIS — J441 Chronic obstructive pulmonary disease with (acute) exacerbation: Principal | ICD-10-CM | POA: Diagnosis present

## 2023-11-17 DIAGNOSIS — Z833 Family history of diabetes mellitus: Secondary | ICD-10-CM

## 2023-11-17 DIAGNOSIS — R079 Chest pain, unspecified: Secondary | ICD-10-CM | POA: Diagnosis not present

## 2023-11-17 DIAGNOSIS — Z1152 Encounter for screening for COVID-19: Secondary | ICD-10-CM

## 2023-11-17 DIAGNOSIS — K5190 Ulcerative colitis, unspecified, without complications: Secondary | ICD-10-CM | POA: Diagnosis present

## 2023-11-17 DIAGNOSIS — Z882 Allergy status to sulfonamides status: Secondary | ICD-10-CM

## 2023-11-17 DIAGNOSIS — G894 Chronic pain syndrome: Secondary | ICD-10-CM | POA: Diagnosis present

## 2023-11-17 DIAGNOSIS — E871 Hypo-osmolality and hyponatremia: Secondary | ICD-10-CM | POA: Diagnosis present

## 2023-11-17 DIAGNOSIS — R0602 Shortness of breath: Secondary | ICD-10-CM | POA: Diagnosis not present

## 2023-11-17 DIAGNOSIS — Z981 Arthrodesis status: Secondary | ICD-10-CM

## 2023-11-17 DIAGNOSIS — I2510 Atherosclerotic heart disease of native coronary artery without angina pectoris: Secondary | ICD-10-CM | POA: Diagnosis present

## 2023-11-17 DIAGNOSIS — F17210 Nicotine dependence, cigarettes, uncomplicated: Secondary | ICD-10-CM | POA: Diagnosis present

## 2023-11-17 DIAGNOSIS — J440 Chronic obstructive pulmonary disease with acute lower respiratory infection: Secondary | ICD-10-CM | POA: Diagnosis present

## 2023-11-17 DIAGNOSIS — F32A Depression, unspecified: Secondary | ICD-10-CM | POA: Diagnosis present

## 2023-11-17 DIAGNOSIS — J9621 Acute and chronic respiratory failure with hypoxia: Secondary | ICD-10-CM | POA: Diagnosis not present

## 2023-11-17 DIAGNOSIS — T380X5A Adverse effect of glucocorticoids and synthetic analogues, initial encounter: Secondary | ICD-10-CM | POA: Diagnosis not present

## 2023-11-17 DIAGNOSIS — J432 Centrilobular emphysema: Secondary | ICD-10-CM | POA: Diagnosis not present

## 2023-11-17 DIAGNOSIS — E876 Hypokalemia: Secondary | ICD-10-CM | POA: Diagnosis present

## 2023-11-17 DIAGNOSIS — F1910 Other psychoactive substance abuse, uncomplicated: Secondary | ICD-10-CM | POA: Diagnosis present

## 2023-11-17 DIAGNOSIS — Z955 Presence of coronary angioplasty implant and graft: Secondary | ICD-10-CM | POA: Diagnosis not present

## 2023-11-17 DIAGNOSIS — Z7951 Long term (current) use of inhaled steroids: Secondary | ICD-10-CM | POA: Diagnosis not present

## 2023-11-17 DIAGNOSIS — K219 Gastro-esophageal reflux disease without esophagitis: Secondary | ICD-10-CM | POA: Diagnosis present

## 2023-11-17 DIAGNOSIS — F419 Anxiety disorder, unspecified: Secondary | ICD-10-CM | POA: Diagnosis present

## 2023-11-17 DIAGNOSIS — R000 Tachycardia, unspecified: Secondary | ICD-10-CM | POA: Diagnosis not present

## 2023-11-17 DIAGNOSIS — J189 Pneumonia, unspecified organism: Principal | ICD-10-CM

## 2023-11-17 DIAGNOSIS — D649 Anemia, unspecified: Secondary | ICD-10-CM | POA: Diagnosis present

## 2023-11-17 DIAGNOSIS — Z8673 Personal history of transient ischemic attack (TIA), and cerebral infarction without residual deficits: Secondary | ICD-10-CM

## 2023-11-17 DIAGNOSIS — Z716 Tobacco abuse counseling: Secondary | ICD-10-CM

## 2023-11-17 DIAGNOSIS — R040 Epistaxis: Secondary | ICD-10-CM | POA: Diagnosis not present

## 2023-11-17 DIAGNOSIS — B9789 Other viral agents as the cause of diseases classified elsewhere: Secondary | ICD-10-CM | POA: Diagnosis present

## 2023-11-17 DIAGNOSIS — Z9981 Dependence on supplemental oxygen: Secondary | ICD-10-CM

## 2023-11-17 DIAGNOSIS — Z79899 Other long term (current) drug therapy: Secondary | ICD-10-CM

## 2023-11-17 DIAGNOSIS — F1120 Opioid dependence, uncomplicated: Secondary | ICD-10-CM | POA: Diagnosis present

## 2023-11-17 DIAGNOSIS — J90 Pleural effusion, not elsewhere classified: Secondary | ICD-10-CM | POA: Diagnosis not present

## 2023-11-17 LAB — I-STAT CHEM 8, ED
BUN: 16 mg/dL (ref 8–23)
Calcium, Ion: 1.12 mmol/L — ABNORMAL LOW (ref 1.15–1.40)
Chloride: 98 mmol/L (ref 98–111)
Creatinine, Ser: 0.8 mg/dL (ref 0.44–1.00)
Glucose, Bld: 127 mg/dL — ABNORMAL HIGH (ref 70–99)
HCT: 31 % — ABNORMAL LOW (ref 36.0–46.0)
Hemoglobin: 10.5 g/dL — ABNORMAL LOW (ref 12.0–15.0)
Potassium: 3.3 mmol/L — ABNORMAL LOW (ref 3.5–5.1)
Sodium: 137 mmol/L (ref 135–145)
TCO2: 31 mmol/L (ref 22–32)

## 2023-11-17 LAB — CBC WITH DIFFERENTIAL/PLATELET
Abs Immature Granulocytes: 0.2 10*3/uL — ABNORMAL HIGH (ref 0.00–0.07)
Basophils Absolute: 0 10*3/uL (ref 0.0–0.1)
Basophils Relative: 0 %
Eosinophils Absolute: 0 10*3/uL (ref 0.0–0.5)
Eosinophils Relative: 0 %
HCT: 32.5 % — ABNORMAL LOW (ref 36.0–46.0)
Hemoglobin: 10.9 g/dL — ABNORMAL LOW (ref 12.0–15.0)
Lymphocytes Relative: 13 %
Lymphs Abs: 2.2 10*3/uL (ref 0.7–4.0)
MCH: 30.1 pg (ref 26.0–34.0)
MCHC: 33.5 g/dL (ref 30.0–36.0)
MCV: 89.8 fL (ref 80.0–100.0)
Metamyelocytes Relative: 1 %
Monocytes Absolute: 1.2 10*3/uL — ABNORMAL HIGH (ref 0.1–1.0)
Monocytes Relative: 7 %
Neutro Abs: 13.7 10*3/uL — ABNORMAL HIGH (ref 1.7–7.7)
Neutrophils Relative %: 79 %
Platelets: 311 10*3/uL (ref 150–400)
RBC: 3.62 MIL/uL — ABNORMAL LOW (ref 3.87–5.11)
RDW: 13.5 % (ref 11.5–15.5)
WBC: 17.3 10*3/uL — ABNORMAL HIGH (ref 4.0–10.5)
nRBC: 0.2 % (ref 0.0–0.2)

## 2023-11-17 LAB — COMPREHENSIVE METABOLIC PANEL WITH GFR
ALT: 11 U/L (ref 0–44)
AST: 12 U/L — ABNORMAL LOW (ref 15–41)
Albumin: 2.7 g/dL — ABNORMAL LOW (ref 3.5–5.0)
Alkaline Phosphatase: 73 U/L (ref 38–126)
Anion gap: 9 (ref 5–15)
BUN: 18 mg/dL (ref 8–23)
CO2: 28 mmol/L (ref 22–32)
Calcium: 8.4 mg/dL — ABNORMAL LOW (ref 8.9–10.3)
Chloride: 97 mmol/L — ABNORMAL LOW (ref 98–111)
Creatinine, Ser: 0.84 mg/dL (ref 0.44–1.00)
GFR, Estimated: >60 mL/min (ref >60)
Glucose, Bld: 109 mg/dL — ABNORMAL HIGH (ref 70–99)
Potassium: 3.4 mmol/L — ABNORMAL LOW (ref 3.5–5.1)
Sodium: 134 mmol/L — ABNORMAL LOW (ref 135–145)
Total Bilirubin: 0.4 mg/dL (ref 0.0–1.2)
Total Protein: 6.3 g/dL — ABNORMAL LOW (ref 6.5–8.1)

## 2023-11-17 LAB — TROPONIN I (HIGH SENSITIVITY)
Troponin I (High Sensitivity): 5 ng/L (ref <18)
Troponin I (High Sensitivity): 4 ng/L (ref <18)

## 2023-11-17 MED ORDER — BUPRENORPHINE HCL 2 MG SL SUBL
8.0000 mg | SUBLINGUAL_TABLET | Freq: Two times a day (BID) | SUBLINGUAL | Status: DC
  Administered 2023-11-17 – 2023-11-22 (×11): 8 mg via SUBLINGUAL
  Filled 2023-11-17 (×13): qty 4

## 2023-11-17 MED ORDER — QUETIAPINE FUMARATE 100 MG PO TABS
100.0000 mg | ORAL_TABLET | Freq: Every day | ORAL | Status: DC
Start: 2023-11-17 — End: 2023-11-26
  Administered 2023-11-17 – 2023-11-25 (×9): 100 mg via ORAL
  Filled 2023-11-17 (×9): qty 1

## 2023-11-17 MED ORDER — ACETAMINOPHEN 650 MG RE SUPP: 650.0000 mg | Freq: Four times a day (QID) | RECTAL | Status: DC | PRN

## 2023-11-17 MED ORDER — PANTOPRAZOLE SODIUM 40 MG PO TBEC
40.0000 mg | DELAYED_RELEASE_TABLET | Freq: Every day | ORAL | Status: DC
  Administered 2023-11-17 – 2023-11-26 (×10): 40 mg via ORAL
  Filled 2023-11-17 (×10): qty 1

## 2023-11-17 MED ORDER — SODIUM CHLORIDE 0.9% FLUSH
3.0000 mL | Freq: Two times a day (BID) | INTRAVENOUS | Status: DC
  Administered 2023-11-17 – 2023-11-26 (×16): 3 mL via INTRAVENOUS

## 2023-11-17 MED ORDER — ATORVASTATIN CALCIUM 40 MG PO TABS: 80.0000 mg | ORAL_TABLET | Freq: Every day | ORAL | Status: DC

## 2023-11-17 MED ORDER — ONDANSETRON HCL 4 MG PO TABS: 4.0000 mg | ORAL_TABLET | Freq: Four times a day (QID) | ORAL | Status: DC | PRN

## 2023-11-17 MED ORDER — POTASSIUM CHLORIDE CRYS ER 20 MEQ PO TBCR
40.0000 meq | EXTENDED_RELEASE_TABLET | Freq: Once | ORAL | Status: AC
  Administered 2023-11-17: 40 meq via ORAL
  Filled 2023-11-17: qty 2

## 2023-11-17 MED ORDER — ONDANSETRON HCL 4 MG/2ML IJ SOLN: 4.0000 mg | Freq: Once | INTRAMUSCULAR | Status: AC

## 2023-11-17 MED ORDER — ACETAMINOPHEN 325 MG PO TABS
650.0000 mg | ORAL_TABLET | Freq: Four times a day (QID) | ORAL | Status: DC | PRN
  Administered 2023-11-19: 650 mg via ORAL
  Filled 2023-11-17: qty 2

## 2023-11-17 MED ORDER — GUAIFENESIN ER 600 MG PO TB12: 600.0000 mg | ORAL_TABLET | Freq: Two times a day (BID) | ORAL | Status: DC

## 2023-11-17 MED ORDER — SODIUM CHLORIDE 0.9% FLUSH
3.0000 mL | Freq: Two times a day (BID) | INTRAVENOUS | Status: DC
  Administered 2023-11-17 – 2023-11-25 (×13): 3 mL via INTRAVENOUS

## 2023-11-17 MED ORDER — OXYCODONE-ACETAMINOPHEN 5-325 MG PO TABS
1.0000 | ORAL_TABLET | Freq: Once | ORAL | Status: AC
  Administered 2023-11-17: 1 via ORAL
  Filled 2023-11-17: qty 1

## 2023-11-17 MED ORDER — GABAPENTIN 300 MG PO CAPS: 300.0000 mg | ORAL_CAPSULE | Freq: Every day | ORAL | Status: DC

## 2023-11-17 MED ORDER — BISACODYL 10 MG RE SUPP: 10.0000 mg | Freq: Every day | RECTAL | Status: DC | PRN

## 2023-11-17 MED ORDER — TRAZODONE HCL 50 MG PO TABS: 50.0000 mg | ORAL_TABLET | Freq: Every evening | ORAL | Status: DC | PRN

## 2023-11-17 MED ORDER — GABAPENTIN 300 MG PO CAPS
300.0000 mg | ORAL_CAPSULE | Freq: Three times a day (TID) | ORAL | Status: DC
  Administered 2023-11-17 – 2023-11-26 (×27): 300 mg via ORAL
  Filled 2023-11-17 (×27): qty 1

## 2023-11-17 MED ORDER — ALBUTEROL SULFATE (2.5 MG/3ML) 0.083% IN NEBU
2.5000 mg | INHALATION_SOLUTION | RESPIRATORY_TRACT | Status: DC | PRN
  Filled 2023-11-17: qty 3

## 2023-11-17 MED ORDER — SODIUM CHLORIDE 0.9 % IV SOLN
500.0000 mg | Freq: Once | INTRAVENOUS | Status: AC
  Administered 2023-11-17: 500 mg via INTRAVENOUS
  Filled 2023-11-17: qty 5

## 2023-11-17 MED ORDER — ASPIRIN 81 MG PO TBEC
81.0000 mg | DELAYED_RELEASE_TABLET | Freq: Every day | ORAL | Status: DC
  Administered 2023-11-18 – 2023-11-26 (×9): 81 mg via ORAL
  Filled 2023-11-17 (×9): qty 1

## 2023-11-17 MED ORDER — POTASSIUM CHLORIDE 10 MEQ/100ML IV SOLN
10.0000 meq | INTRAVENOUS | Status: AC
  Administered 2023-11-17 – 2023-11-18 (×4): 10 meq via INTRAVENOUS
  Filled 2023-11-17 (×4): qty 100

## 2023-11-17 MED ORDER — DIVALPROEX SODIUM 250 MG PO DR TAB
250.0000 mg | DELAYED_RELEASE_TABLET | Freq: Every day | ORAL | Status: DC
  Administered 2023-11-17 – 2023-11-26 (×10): 250 mg via ORAL
  Filled 2023-11-17 (×10): qty 1

## 2023-11-17 MED ORDER — HEPARIN SODIUM (PORCINE) 5000 UNIT/ML IJ SOLN
5000.0000 [IU] | Freq: Three times a day (TID) | INTRAMUSCULAR | Status: DC
  Administered 2023-11-17 – 2023-11-18 (×3): 5000 [IU] via SUBCUTANEOUS
  Filled 2023-11-17 (×3): qty 1

## 2023-11-17 MED ORDER — SODIUM CHLORIDE 0.9 % IV SOLN: INTRAVENOUS | Status: AC | PRN

## 2023-11-17 MED ORDER — ONDANSETRON HCL 4 MG/2ML IJ SOLN
4.0000 mg | Freq: Four times a day (QID) | INTRAMUSCULAR | Status: DC | PRN
  Administered 2023-11-20 – 2023-11-26 (×3): 4 mg via INTRAVENOUS
  Filled 2023-11-17 (×3): qty 2

## 2023-11-17 MED ORDER — ALBUTEROL SULFATE (2.5 MG/3ML) 0.083% IN NEBU
2.5000 mg | INHALATION_SOLUTION | Freq: Once | RESPIRATORY_TRACT | Status: AC
  Administered 2023-11-17: 2.5 mg via RESPIRATORY_TRACT
  Filled 2023-11-17: qty 3

## 2023-11-17 MED ORDER — SODIUM CHLORIDE 0.9 % IV SOLN
2.0000 g | Freq: Once | INTRAVENOUS | Status: AC
  Administered 2023-11-17: 2 g via INTRAVENOUS
  Filled 2023-11-17: qty 20

## 2023-11-17 MED ORDER — BUPROPION HCL ER (SR) 150 MG PO TB12
150.0000 mg | ORAL_TABLET | Freq: Every morning | ORAL | Status: DC
  Administered 2023-11-17 – 2023-11-26 (×10): 150 mg via ORAL
  Filled 2023-11-17 (×10): qty 1

## 2023-11-17 MED ORDER — IPRATROPIUM-ALBUTEROL 0.5-2.5 (3) MG/3ML IN SOLN
3.0000 mL | Freq: Once | RESPIRATORY_TRACT | Status: AC
  Administered 2023-11-17: 3 mL via RESPIRATORY_TRACT
  Filled 2023-11-17: qty 3

## 2023-11-17 MED ORDER — SODIUM CHLORIDE 0.9 % IV SOLN
500.0000 mg | INTRAVENOUS | Status: DC
  Administered 2023-11-18 – 2023-11-21 (×4): 500 mg via INTRAVENOUS
  Filled 2023-11-17 (×5): qty 5

## 2023-11-17 MED ORDER — SODIUM CHLORIDE 0.9% FLUSH: 3.0000 mL | INTRAVENOUS | Status: DC | PRN

## 2023-11-17 MED ORDER — BUDESON-GLYCOPYRROL-FORMOTEROL 160-9-4.8 MCG/ACT IN AERO: 2.0000 | INHALATION_SPRAY | Freq: Two times a day (BID) | RESPIRATORY_TRACT | Status: DC

## 2023-11-17 MED ORDER — ONDANSETRON HCL 4 MG/2ML IJ SOLN
INTRAMUSCULAR | Status: AC
  Administered 2023-11-17: 4 mg via INTRAVENOUS
  Filled 2023-11-17: qty 2

## 2023-11-17 MED ORDER — IPRATROPIUM-ALBUTEROL 0.5-2.5 (3) MG/3ML IN SOLN
3.0000 mL | Freq: Four times a day (QID) | RESPIRATORY_TRACT | Status: DC
  Administered 2023-11-17 – 2023-11-18 (×3): 3 mL via RESPIRATORY_TRACT
  Filled 2023-11-17 (×3): qty 3

## 2023-11-17 MED ORDER — ONDANSETRON 4 MG PO TBDP
4.0000 mg | ORAL_TABLET | Freq: Once | ORAL | Status: DC
  Filled 2023-11-17: qty 1

## 2023-11-17 MED ORDER — METHYLPREDNISOLONE SODIUM SUCC 40 MG IJ SOLR
40.0000 mg | Freq: Two times a day (BID) | INTRAMUSCULAR | Status: DC
  Administered 2023-11-17 – 2023-11-18 (×2): 40 mg via INTRAVENOUS
  Filled 2023-11-17 (×2): qty 1

## 2023-11-17 MED ORDER — DM-GUAIFENESIN ER 30-600 MG PO TB12
1.0000 | ORAL_TABLET | Freq: Two times a day (BID) | ORAL | Status: DC
  Administered 2023-11-17 – 2023-11-26 (×19): 1 via ORAL
  Filled 2023-11-17 (×19): qty 1

## 2023-11-17 MED ORDER — BUDESONIDE 0.25 MG/2ML IN SUSP
0.2500 mg | Freq: Two times a day (BID) | RESPIRATORY_TRACT | Status: DC
Start: 2023-11-17 — End: 2023-11-18
  Administered 2023-11-17 – 2023-11-18 (×2): 0.25 mg via RESPIRATORY_TRACT
  Filled 2023-11-17 (×2): qty 2

## 2023-11-17 MED ORDER — MAGNESIUM SULFATE 2 GM/50ML IV SOLN
2.0000 g | Freq: Once | INTRAVENOUS | Status: AC
  Administered 2023-11-17: 2 g via INTRAVENOUS
  Filled 2023-11-17: qty 50

## 2023-11-17 MED ORDER — SODIUM CHLORIDE 0.9 % IV SOLN
2.0000 g | INTRAVENOUS | Status: DC
  Administered 2023-11-18: 2 g via INTRAVENOUS
  Filled 2023-11-17: qty 20

## 2023-11-17 MED ORDER — METHYLPREDNISOLONE SODIUM SUCC 125 MG IJ SOLR
125.0000 mg | Freq: Once | INTRAMUSCULAR | Status: AC
Start: 2023-11-17 — End: 2023-11-17
  Administered 2023-11-17: 125 mg via INTRAVENOUS
  Filled 2023-11-17: qty 2

## 2023-11-17 MED ORDER — POLYETHYLENE GLYCOL 3350 17 G PO PACK
17.0000 g | PACK | Freq: Every day | ORAL | Status: DC | PRN
  Filled 2023-11-17: qty 1

## 2023-11-17 MED ORDER — FAMOTIDINE 20 MG PO TABS: 20.0000 mg | ORAL_TABLET | Freq: Every day | ORAL | Status: DC

## 2023-11-17 NOTE — ED Notes (Signed)
 Pt on 3 L [place] at this time. SpO2 97%

## 2023-11-17 NOTE — ED Notes (Signed)
 RT at bedside.

## 2023-11-17 NOTE — ED Notes (Signed)
 Pt states she is on 3 L via [place] at baseline. States Hx of COPD. Pt is currently on 4 L [place] with an SpO2 of 92%

## 2023-11-17 NOTE — ED Provider Notes (Signed)
 [facility] EMERGENCY DEPARTMENT AT [facility] Provider Note   CSN: [id number] Arrival date & time: 11/17/23  0865     History  Chief Complaint  Patient presents with   Chest Pain    [name] [name] is a 63 y.o. female.  Patient complains of shortness of breath and cough.  She has a history of COPD.  Her shortness of breath gotten worse.  The history is provided by the patient and medical records. No language interpreter was used.  Cough Cough characteristics:  Non-productive Sputum characteristics:  Nondescript Severity:  Moderate Onset quality:  Sudden Timing:  Constant Progression:  Worsening Chronicity:  New Smoker: no   Context: not animal exposure   Relieved by:  Nothing Worsened by:  Nothing Associated symptoms: no chest pain, no eye discharge, no headaches and no rash        Home Medications Prior to Admission medications   Medication Sig Start Date End Date Taking? Authorizing Provider  albuterol  (PROVENTIL  HFA;VENTOLIN  HFA) 108 (90 BASE) MCG/ACT inhaler Inhale 2 puffs into the lungs every 6 (six) hours as needed for shortness of breath or wheezing.    [provider]  albuterol  (PROVENTIL ) (2.5 MG/3ML) 0.083% nebulizer solution Take 3 mLs (2.5 mg total) by nebulization every 4 (four) hours as needed for wheezing or shortness of breath. 07/10/21   Diamond Formica, MD  albuterol  (PROVENTIL ) (2.5 MG/3ML) 0.083% nebulizer solution Take 3 mLs (2.5 mg total) by nebulization every 4 (four) hours as needed. 09/10/23   Diamond Formica, MD  amoxicillin -clavulanate (AUGMENTIN ) 875-125 MG tablet Take 1 tablet by mouth 2 (two) times daily. 10/22/23   Diamond Formica, MD  aspirin  EC 81 MG tablet Take 81 mg by mouth daily. Swallow whole.    [provider]  atorvastatin  (LIPITOR) 80 MG tablet Take 1 tablet (80 mg total) by mouth daily. 04/22/23 07/21/23  Lasalle Pointer, NP  budeson-glycopyrrolate-formoterol  (BREZTRI  AEROSPHERE) 160-9-4.8 MCG/ACT AERO  Inhale 2 puffs into the lungs in the morning and at bedtime. 09/10/23   Diamond Formica, MD  budesonide -formoterol  (SYMBICORT ) 80-4.5 MCG/ACT inhaler Take 2 puffs first thing in am and then another 2 puffs about 12 hours later. 07/10/21   Diamond Formica, MD  buprenorphine  (SUBUTEX ) 8 MG SUBL SL tablet Place 8 mg under the tongue in the morning, at noon, in the evening, and at bedtime. 08/18/23   [provider]  buPROPion  (WELLBUTRIN  SR) 150 MG 12 hr tablet Take 150 mg by mouth every morning. 09/15/23   [provider]  divalproex  (DEPAKOTE ) 250 MG DR tablet Take 250 mg by mouth daily. 10/27/23   [provider]  famotidine  (PEPCID ) 20 MG tablet One after supper 09/10/23   Diamond Formica, MD  gabapentin  (NEURONTIN ) 300 MG capsule Take 300 mg by mouth at bedtime. 10/12/20   [provider]  guaiFENesin  (MUCINEX ) 600 MG 12 hr tablet Take 1 tablet (600 mg total) by mouth 2 (two) times daily. 04/11/14   Gwendalyn Lemma, MD  ondansetron  (ZOFRAN ) 4 MG tablet Take 1 tablet (4 mg total) by mouth every 4 (four) hours as needed for nausea or vomiting. 11/14/23   Teddi Favors, DO  ondansetron  (ZOFRAN -ODT) 8 MG disintegrating tablet Take 8 mg by mouth every 6 (six) hours as needed for vomiting or nausea. 06/26/21   [provider]  OXYGEN  Inhale 4 L into the lungs continuous as needed (shortness of breath).    [provider]  pantoprazole  (PROTONIX )  40 MG tablet Take 1 tablet (40 mg total) by mouth daily. Take 30-60 min before first meal of the day 09/10/23   Diamond Formica, MD  predniSONE  (DELTASONE ) 10 MG tablet Take 4 for two days three for two days two for two days one for two days T 10/22/23   Diamond Formica, MD  promethazine  (PHENERGAN ) 25 MG tablet Take 25 mg by mouth every 6 (six) hours as needed. 08/20/23   [provider]  QUEtiapine  (SEROQUEL ) 100 MG tablet Take 100 mg by mouth at bedtime. 11/06/20   [provider]      Allergies     Codeine , Hydrocodone, Sulfa antibiotics, and Hydrocodone-acetaminophen     Review of Systems   Review of Systems  Constitutional:  Negative for appetite change and fatigue.  HENT:  Negative for congestion, ear discharge and sinus pressure.   Eyes:  Negative for discharge.  Respiratory:  Positive for cough.   Cardiovascular:  Negative for chest pain.  Gastrointestinal:  Negative for abdominal pain and diarrhea.  Genitourinary:  Negative for frequency and hematuria.  Musculoskeletal:  Negative for back pain.  Skin:  Negative for rash.  Neurological:  Negative for seizures and headaches.  Psychiatric/Behavioral:  Negative for hallucinations.     Physical Exam Updated Vital Signs BP 102/71   Pulse 98   Temp 98.5 F (36.9 C) (Oral)   Resp 15   Ht 5\' 4"  (1.626 m)   Wt 59 kg   SpO2 93%   BMI 22.31 kg/m  Physical Exam Vitals and nursing note reviewed.  Constitutional:      Appearance: She is well-developed.  HENT:     Head: Normocephalic.     Nose: Nose normal.  Eyes:     General: No scleral icterus.    Conjunctiva/sclera: Conjunctivae normal.  Neck:     Thyroid : No thyromegaly.  Cardiovascular:     Rate and Rhythm: Normal rate and regular rhythm.     Heart sounds: No murmur heard.    No friction rub. No gallop.  Pulmonary:     Breath sounds: No stridor. Wheezing present. No rales.  Chest:     Chest wall: No tenderness.  Abdominal:     General: There is no distension.     Tenderness: There is no abdominal tenderness. There is no rebound.  Musculoskeletal:        General: Normal range of motion.     Cervical back: Neck supple.  Lymphadenopathy:     Cervical: No cervical adenopathy.  Skin:    Findings: No erythema or rash.  Neurological:     Mental Status: She is alert and oriented to person, place, and time.     Motor: No abnormal muscle tone.     Coordination: Coordination normal.  Psychiatric:        Behavior: Behavior normal.     ED Results / Procedures  / Treatments   Labs (all labs ordered are listed, but only abnormal results are displayed) Labs Reviewed  CBC WITH DIFFERENTIAL/PLATELET - Abnormal; Notable for the following components:      Result Value   WBC 17.3 (*)    RBC 3.62 (*)    Hemoglobin 10.9 (*)    HCT 32.5 (*)    Neutro Abs 13.7 (*)    Monocytes Absolute 1.2 (*)    Abs Immature Granulocytes 0.20 (*)    All other components within normal limits  COMPREHENSIVE METABOLIC PANEL WITH GFR - Abnormal; Notable for the following components:  Sodium 134 (*)    Potassium 3.4 (*)    Chloride 97 (*)    Glucose, Bld 109 (*)    Calcium  8.4 (*)    Total Protein 6.3 (*)    Albumin 2.7 (*)    AST 12 (*)    All other components within normal limits  I-STAT CHEM 8, ED  TROPONIN I (HIGH SENSITIVITY)  TROPONIN I (HIGH SENSITIVITY)    EKG None  Radiology DG Chest Port 1 View Result Date: 11/17/2023 CLINICAL DATA:  Shortness of breath. EXAM: PORTABLE CHEST 1 VIEW COMPARISON:  11/14/2023. FINDINGS: There are heterogeneous opacities at the bilateral lung bases, slightly more pronounced than the prior exam. Findings may represent combination of atelectasis and or pneumonitis. Correlate clinically. Redemonstration of blunting of right lateral costophrenic angle, which may represent trace right pleural effusion. No significant interval change. No significant left pleural effusion. No pneumothorax on either side. Stable cardio-mediastinal silhouette. No acute osseous abnormalities. Partially seen lower cervical spinal fixation hardware. The soft tissues are within normal limits. IMPRESSION: *Heterogeneous opacities at the bilateral lung bases, slightly more pronounced than the prior exam. Findings may represent combination of atelectasis and or pneumonitis. Electronically Signed   By: Beula Brunswick M.D.   On: 11/17/2023 08:33    Procedures Procedures    Medications Ordered in ED Medications  oxyCODONE -acetaminophen  (PERCOCET/ROXICET)  5-325 MG per tablet 1 tablet (has no administration in time range)  magnesium  sulfate IVPB 2 g 50 mL (2 g Intravenous New Bag/Given 11/17/23 0852)  cefTRIAXone  (ROCEPHIN ) 2 g in sodium chloride  0.9 % 100 mL IVPB (has no administration in time range)  azithromycin  (ZITHROMAX ) 500 mg in sodium chloride  0.9 % 250 mL IVPB (has no administration in time range)  ipratropium-albuterol  (DUONEB) 0.5-2.5 (3) MG/3ML nebulizer solution 3 mL (3 mLs Nebulization Given 11/17/23 0825)  albuterol  (PROVENTIL ) (2.5 MG/3ML) 0.083% nebulizer solution 2.5 mg (2.5 mg Nebulization Given 11/17/23 0825)  methylPREDNISolone  sodium succinate (SOLU-MEDROL ) 125 mg/2 mL injection 125 mg (125 mg Intravenous Given 11/17/23 0832)  ondansetron  (ZOFRAN ) injection 4 mg (4 mg Intravenous Given 11/17/23 0831)    ED Course/ Medical Decision Making/ A&P Clinical Course as of 11/21/23 1104  Mon Nov 17, 2023  1001 Signout obtained from Dr. Bryna Car.  Patient with COPD exacerbation.  Received Zithromax , Medrol , magnesium , Rocephin , and butyryl.  Baseline 3 L/min now on 4 L/min sats have been 89 to 96%.  Continues to have wheezing after above treatment.  Plan admission to hospitalist service for further evaluation and treatment [DR]    Clinical Course User Index [DR] Auston Blush, MD                                 Medical Decision Making Amount and/or Complexity of Data Reviewed Labs: ordered. Radiology: ordered. ECG/medicine tests: ordered.  Risk Prescription drug management. Decision regarding hospitalization.   Patient with hypoxia and exacerbation of COPD with possible pneumonia.  She will be admitted to medicine        Final Clinical Impression(s) / ED Diagnoses Final diagnoses:  None    Rx / DC Orders ED Discharge Orders     None         Cheyenne Cotta, MD 11/21/23 1104

## 2023-11-17 NOTE — ED Triage Notes (Signed)
 Pt c/o right side chest pain that radiates up her shoulder and down her back x 4 days  Pt has non-productive cough

## 2023-11-17 NOTE — ED Notes (Signed)
Ambulatory with assistance.

## 2023-11-17 NOTE — H&P (Signed)
 History and Physical   Patient: [name] [name] WUJ:[id number] DOB: 03/21/1961 DOA: 11/17/2023 DOS: the patient was seen and examined on 11/17/2023 PCP: [name], PA-C  Patient coming from: Home  Chief Complaint:  Chief Complaint  Patient presents with   Chest Pain   HPI: [name] is a 63 y.o. female with medical history significant for ongoing tobacco abuse, COPD with chronic hypoxic respiratory failure on 3 L of oxygen  via nasal cannula PTA, prior history of pulmonary embolism, CAD with prior angioplasty and stent placement in 2001, GERD with prior history of duodenal ulcers, and prior history of CVA presents to the ED with worsening shortness of breath and worsening hypoxia. - Over the last 4 to 5 days patient has had episodes of recurrent emesis, she later developed right-sided chest wall and rib cage as well as anterior abdominal wall and flank pain presumably from excessive vomiting and retching. - Emesis was without bile or blood - No diarrhea - Cough becoming more productive  In ED chest x-ray shows--IMPRESSION: *Heterogeneous opacities at the bilateral lung bases, slightly more pronounced than the prior exam. Findings may represent  possible pneumonitis. Troponin 5 >>4, EKG shows sinus tachycardia without acute changes - SOdium 134 potassium 3.4 bicarb 28 creatinine 0.84 LFTs are not elevated - WBC 17.3, Hgb 10.9 and platelets 311 - Flu COVID and RSV were negative on 11/14/2023  Review of Systems: As mentioned in the history of present illness. All other systems reviewed and are negative. Past Medical History:  Diagnosis Date   Acid reflux    Arthritis    CAD (coronary artery disease)    S/P cath with stent in 2001   CHF (congestive heart failure) (HCC)    Collagen vascular disease (HCC)    COPD (chronic obstructive pulmonary disease) (HCC)    CVA (cerebral vascular accident) (HCC) 1992   DU (duodenal ulcer) 09/19/2009   Hiatal hernia    EGD september 2007.  normal except for small hiatal hernia. She underwent small bowel biopsy with history of diarrhea at that time. This was negative   History of colonoscopy    August 2007. She had a pedunculated polyp at 30 cm. which was hamartomatous polyp. She  is due for 5-year followup with family history of coln cancer in a brother at age 44   History of colonoscopy    2005 revealed a linear ulcer with scar versus inflammatory process at the mid right colon, but normal terminal ileum. A biopsy of this area revealed an ulceration, but nonspecific   History of coronary angiogram    CT angiogram in 2007. negative with normal mesenteric arteries   History of kidney stones    Migraine    Pneumonia    pt states she gets pneumonia about twice a year   Pulmonary embolus (HCC) 1999   Ulcerative colitis (HCC)    Urinary frequency    Past Surgical History:  Procedure Laterality Date   ABDOMINAL HYSTERECTOMY  1985   ANTERIOR CERVICAL DECOMP/DISCECTOMY FUSION N/A 08/15/2021   Procedure: Cervical Three-Four, Cervical Four-Five, Cervical Five-Six  Anterior cervical decompression/discectomy/fusion/interbody prosthesis/plate/screws;  Surgeon: Garry Kansas, MD;  Location: [facility] OR;  Service: Neurosurgery;  Laterality: N/A;  Cervical Three-Four, Cervical Four-Five, Cervical Five-Six  Anterior cervical decompression/discectomy/fusion/interbody prosthesis/plate/screws   APPENDECTOMY     CESAREAN SECTION  1979   CHOLECYSTECTOMY  2021   COLONOSCOPY  01/2006   Rourk-pedunculated polyp 30 cm hamartomatous polyp,   COLONOSCOPY  2005   Lineal ulcer or mid  right colon, normal TI nonspecific biopsy   CORONARY ANGIOPLASTY WITH STENT PLACEMENT  2001   [facility], [place]   ESOPHAGOGASTRODUODENOSCOPY  09/19/2009   Rourk -2 duodenal bulbar ulcers, small HH otherwise normal/tiny distal esophageal erosions with mild erosive reflux   ESOPHAGOGASTRODUODENOSCOPY  03/2006   Rourk-Hiatal hernia, negative small bowel biopsy   LEFT  HEART CATH AND CORONARY ANGIOGRAPHY N/A 04/18/2021   Procedure: LEFT HEART CATH AND CORONARY ANGIOGRAPHY;  Surgeon: Kyra Phy, MD;  Location: [facility] INVASIVE CV LAB;  Service: Cardiovascular;  Laterality: N/A;   SPINE SURGERY     TONSILLECTOMY  1986   TUBAL LIGATION     Unilateral oophorectomy with hysterectomy     Social History:  reports that she has been smoking cigarettes. She has a 17.5 pack-year smoking history. She has been exposed to tobacco smoke. She has never used smokeless tobacco. She reports that she does not drink alcohol and does not use drugs.  Allergies  Allergen Reactions   Codeine     Hydrocodone Nausea And Vomiting   Sulfa Antibiotics Hives and Itching   Hydrocodone-Acetaminophen  Nausea And Vomiting and Anxiety    Family History  Problem Relation Age of Onset   Colon cancer Brother 90       died 1 year later   Heart attack Mother 6   Diabetes Sister     Prior to Admission medications   Medication Sig Start Date End Date Taking? Authorizing Provider  albuterol  (PROVENTIL  HFA;VENTOLIN  HFA) 108 (90 BASE) MCG/ACT inhaler Inhale 2 puffs into the lungs every 6 (six) hours as needed for shortness of breath or wheezing.    [provider]  albuterol  (PROVENTIL ) (2.5 MG/3ML) 0.083% nebulizer solution Take 3 mLs (2.5 mg total) by nebulization every 4 (four) hours as needed for wheezing or shortness of breath. 07/10/21   Diamond Formica, MD  aspirin  EC 81 MG tablet Take 81 mg by mouth daily. Swallow whole.    [provider]  budeson-glycopyrrolate-formoterol  (BREZTRI  AEROSPHERE) 160-9-4.8 MCG/ACT AERO Inhale 2 puffs into the lungs in the morning and at bedtime. 09/10/23   Diamond Formica, MD  buprenorphine  (SUBUTEX ) 8 MG SUBL SL tablet Place 8 mg under the tongue in the morning and at bedtime. 08/18/23   [provider]  buPROPion  (WELLBUTRIN  SR) 150 MG 12 hr tablet Take 150 mg by mouth every morning. 09/15/23   [provider]   divalproex  (DEPAKOTE ) 250 MG DR tablet Take 250 mg by mouth daily. 10/27/23   [provider]  famotidine  (PEPCID ) 20 MG tablet One after supper Patient taking differently: Take 20 mg by mouth at bedtime. One after supper 09/10/23   Diamond Formica, MD  gabapentin  (NEURONTIN ) 300 MG capsule Take 300 mg by mouth 3 (three) times daily. 10/12/20   [provider]  guaiFENesin  (MUCINEX ) 600 MG 12 hr tablet Take 1 tablet (600 mg total) by mouth 2 (two) times daily. 04/11/14   Gwendalyn Lemma, MD  ondansetron  (ZOFRAN ) 4 MG tablet Take 1 tablet (4 mg total) by mouth every 4 (four) hours as needed for nausea or vomiting. 11/14/23   Russella Courts A, DO  ondansetron  (ZOFRAN -ODT) 8 MG disintegrating tablet Take 8 mg by mouth every 6 (six) hours as needed for vomiting or nausea. 06/26/21   [provider]  OXYGEN  Inhale 4 L into the lungs continuous as needed (shortness of breath).    [provider]  pantoprazole  (PROTONIX ) 40 MG tablet Take 1 tablet (40 mg total)  by mouth daily. Take 30-60 min before first meal of the day 09/10/23   Diamond Formica, MD  promethazine  (PHENERGAN ) 25 MG tablet Take 25 mg by mouth every 6 (six) hours as needed. 08/20/23   [provider]  QUEtiapine  (SEROQUEL ) 100 MG tablet Take 100 mg by mouth at bedtime. 11/06/20   [provider]    Physical Exam: Vitals:   11/17/23 1030 11/17/23 1045 11/17/23 1106 11/17/23 1347  BP: 104/70 104/67 95/73   Pulse: 90 84 88   Resp: 20 20 (!) 22   Temp:  98.5 F (36.9 C) 98.8 F (37.1 C)   TempSrc:  Oral Oral   SpO2: 97% 97% 91% 93%  Weight:      Height:        Physical Exam Gen:- Awake Alert, in no acute distress, dyspnea on exertion HEENT:- [place].AT, No sclera icterus Nose- 5L/min Neck-Supple Neck,No JVD,.  Lungs-diminished breath sounds, few scattered rhonchi  CV- S1, S2 normal, RRR Abd-  +ve B.Sounds, Abd Soft, No tenderness,    Extremity/Skin:- No  edema,   good pedal pulses   Psych-affect is appropriate, oriented x3 Neuro-no new focal deficits, no tremors  Data Reviewed: In ED chest x-ray shows--IMPRESSION: *Heterogeneous opacities at the bilateral lung bases, slightly more pronounced than the prior exam. Findings may represent  possible pneumonitis. Troponin 5 >>4, EKG shows sinus tachycardia without acute changes - SOdium 134 potassium 3.4 bicarb 28 creatinine 0.84 LFTs are not elevated - WBC 17.3, Hgb 10.9 and platelets 311 - Flu COVID and RSV were negative on 11/14/2023  Assessment and Plan: 1)CAP--- clinically and radiologically suspect some component of aspiration pneumonia - Patient had recurrent emesis for the last 4 to 5 days or so - Chest x-ray suggestive of pneumonia, WBC 17.3  - Treat empirically with Rocephin  and azithromycin  along with bronchodilators and mucolytics  2)Acute COPD exacerbation--due to #1 above - Management as above #1 - IV Solu-Medrol  as ordered  3)Acute on chronic hypoxic respiratory failure--at baseline uses 3 L of oxygen  via nasal cannula but not very compliant at home due to ongoing tobacco abuse -- Currently requiring 4 to 5 L of oxygen  via nasal cannula - Anticipate improvement with management as above #1 and #2  4)GERD--history of prior duodenal ulcer - Protonix  as ordered especially given steroid use  5) acute anemia--Hgb down to 10.9, it was 12.6 on 11/14/2023 - Patient with episode of recurrent emesis recently - Check stool occult blood - Protonix  as above #4  6)Mild HypoNatremia/Hypokalemia--sodium is 134, potassium 3.4 in the setting of recurrent emesis and GI losses  --avoid dehydration, - Tolerating oral intake well continue to hydrate  7)Depression/anxiety--stable, continue Wellbutrin , Depakote  and Seroquel   8)Chronic pain Syndrome/chronic Opiate Dependence--- patient with history of snorting hydrocodone and other opiates in the past - continue Subutex  - Continue gabapentin    Advance Care Planning:    Code Status: Full Code   Family Communication: Na--- none available at this time  Severity of Illness: The appropriate patient status for this patient is OBSERVATION. Observation status is judged to be reasonable and necessary in order to provide the required intensity of service to ensure the patient's safety. The patient's presenting symptoms, physical exam findings, and initial radiographic and laboratory data in the context of their medical condition is felt to place them at decreased risk for further clinical deterioration. Furthermore, it is anticipated that the patient will be medically stable for discharge from the hospital within 2 midnights of admission.  Author: Colin Dawley, MD 11/17/2023 6:29 PM  For on call review [email or web address].

## 2023-11-17 NOTE — Plan of Care (Signed)

## 2023-11-18 ENCOUNTER — Inpatient Hospital Stay (HOSPITAL_COMMUNITY)

## 2023-11-18 DIAGNOSIS — J441 Chronic obstructive pulmonary disease with (acute) exacerbation: Secondary | ICD-10-CM

## 2023-11-18 DIAGNOSIS — F17200 Nicotine dependence, unspecified, uncomplicated: Secondary | ICD-10-CM

## 2023-11-18 DIAGNOSIS — F1910 Other psychoactive substance abuse, uncomplicated: Secondary | ICD-10-CM

## 2023-11-18 DIAGNOSIS — J9621 Acute and chronic respiratory failure with hypoxia: Secondary | ICD-10-CM | POA: Diagnosis not present

## 2023-11-18 DIAGNOSIS — J189 Pneumonia, unspecified organism: Secondary | ICD-10-CM | POA: Diagnosis not present

## 2023-11-18 LAB — RESPIRATORY PANEL BY PCR

## 2023-11-18 LAB — BLOOD GAS, VENOUS
Acid-Base Excess: 5.1 mmol/L — ABNORMAL HIGH (ref 0.0–2.0)
Bicarbonate: 29.9 mmol/L — ABNORMAL HIGH (ref 20.0–28.0)
Drawn by: 7012
O2 Saturation: 77.8 %
Patient temperature: 37.1
pCO2, Ven: 44 mmHg (ref 44–60)
pH, Ven: 7.44 — ABNORMAL HIGH (ref 7.25–7.43)
pO2, Ven: 45 mmHg (ref 32–45)

## 2023-11-18 LAB — CBC
HCT: 35.1 % — ABNORMAL LOW (ref 36.0–46.0)
Hemoglobin: 11.2 g/dL — ABNORMAL LOW (ref 12.0–15.0)
MCH: 29.2 pg (ref 26.0–34.0)
MCHC: 31.9 g/dL (ref 30.0–36.0)
MCV: 91.4 fL (ref 80.0–100.0)
Platelets: 333 10*3/uL (ref 150–400)
RBC: 3.84 MIL/uL — ABNORMAL LOW (ref 3.87–5.11)
RDW: 13.8 % (ref 11.5–15.5)
WBC: 26 10*3/uL — ABNORMAL HIGH (ref 4.0–10.5)
nRBC: 0.2 % (ref 0.0–0.2)

## 2023-11-18 LAB — BASIC METABOLIC PANEL WITH GFR
Anion gap: 10 (ref 5–15)
BUN: 20 mg/dL (ref 8–23)
CO2: 25 mmol/L (ref 22–32)
Calcium: 8.1 mg/dL — ABNORMAL LOW (ref 8.9–10.3)
Chloride: 97 mmol/L — ABNORMAL LOW (ref 98–111)
Creatinine, Ser: 0.74 mg/dL (ref 0.44–1.00)
GFR, Estimated: >60 mL/min (ref >60)
Glucose, Bld: 138 mg/dL — ABNORMAL HIGH (ref 70–99)
Potassium: 4.3 mmol/L (ref 3.5–5.1)
Sodium: 132 mmol/L — ABNORMAL LOW (ref 135–145)

## 2023-11-18 LAB — MISC LABCORP TEST (SEND OUT): Labcorp test code: 83935

## 2023-11-18 LAB — BRAIN NATRIURETIC PEPTIDE: B Natriuretic Peptide: 676 pg/mL — ABNORMAL HIGH (ref 0.0–100.0)

## 2023-11-18 LAB — MRSA NEXT GEN BY PCR, NASAL: MRSA by PCR Next Gen: NOT DETECTED

## 2023-11-18 MED ORDER — PIPERACILLIN-TAZOBACTAM 3.375 G IVPB
3.3750 g | Freq: Three times a day (TID) | INTRAVENOUS | Status: DC
  Administered 2023-11-19 – 2023-11-22 (×10): 3.375 g via INTRAVENOUS
  Filled 2023-11-18 (×9): qty 50

## 2023-11-18 MED ORDER — REVEFENACIN 175 MCG/3ML IN SOLN
175.0000 ug | Freq: Every day | RESPIRATORY_TRACT | Status: DC
  Administered 2023-11-19 – 2023-11-26 (×8): 175 ug via RESPIRATORY_TRACT
  Filled 2023-11-18 (×8): qty 3

## 2023-11-18 MED ORDER — NICOTINE 14 MG/24HR TD PT24
14.0000 mg | MEDICATED_PATCH | Freq: Every day | TRANSDERMAL | Status: DC
  Administered 2023-11-18 – 2023-11-26 (×9): 14 mg via TRANSDERMAL
  Filled 2023-11-18 (×9): qty 1

## 2023-11-18 MED ORDER — CHLORHEXIDINE GLUCONATE CLOTH 2 % EX PADS
6.0000 | MEDICATED_PAD | Freq: Every day | CUTANEOUS | Status: DC
  Administered 2023-11-19 – 2023-11-25 (×7): 6 via TOPICAL

## 2023-11-18 MED ORDER — IOHEXOL 350 MG/ML SOLN
75.0000 mL | Freq: Once | INTRAVENOUS | Status: AC | PRN
  Administered 2023-11-18: 75 mL via INTRAVENOUS

## 2023-11-18 MED ORDER — METHYLPREDNISOLONE SODIUM SUCC 40 MG IJ SOLR: 40.0000 mg | Freq: Three times a day (TID) | INTRAMUSCULAR | Status: DC

## 2023-11-18 MED ORDER — METHYLPREDNISOLONE SODIUM SUCC 125 MG IJ SOLR: 60.0000 mg | Freq: Two times a day (BID) | INTRAMUSCULAR | Status: DC

## 2023-11-18 MED ORDER — IPRATROPIUM-ALBUTEROL 0.5-2.5 (3) MG/3ML IN SOLN
3.0000 mL | Freq: Four times a day (QID) | RESPIRATORY_TRACT | Status: AC
  Administered 2023-11-18 – 2023-11-19 (×3): 3 mL via RESPIRATORY_TRACT
  Filled 2023-11-18 (×3): qty 3

## 2023-11-18 MED ORDER — PIPERACILLIN-TAZOBACTAM 3.375 G IVPB 30 MIN
3.3750 g | Freq: Once | INTRAVENOUS | Status: AC
  Administered 2023-11-18: 3.375 g via INTRAVENOUS
  Filled 2023-11-18: qty 50

## 2023-11-18 MED ORDER — ALBUTEROL SULFATE (2.5 MG/3ML) 0.083% IN NEBU
2.5000 mg | INHALATION_SOLUTION | Freq: Once | RESPIRATORY_TRACT | Status: AC
  Administered 2023-11-18: 2.5 mg via RESPIRATORY_TRACT
  Filled 2023-11-18: qty 3

## 2023-11-18 MED ORDER — ARFORMOTEROL TARTRATE 15 MCG/2ML IN NEBU
15.0000 ug | INHALATION_SOLUTION | Freq: Two times a day (BID) | RESPIRATORY_TRACT | Status: DC
  Administered 2023-11-18 – 2023-11-26 (×16): 15 ug via RESPIRATORY_TRACT
  Filled 2023-11-18 (×16): qty 2

## 2023-11-18 MED ORDER — HEPARIN BOLUS VIA INFUSION
2500.0000 [IU] | Freq: Once | INTRAVENOUS | Status: AC
  Administered 2023-11-18: 2500 [IU] via INTRAVENOUS
  Filled 2023-11-18: qty 2500

## 2023-11-18 MED ORDER — HEPARIN (PORCINE) 25000 UT/250ML-% IV SOLN
1450.0000 [IU]/h | INTRAVENOUS | Status: DC
  Administered 2023-11-18: 1050 [IU]/h via INTRAVENOUS
  Administered 2023-11-19: 1300 [IU]/h via INTRAVENOUS
  Administered 2023-11-20: 1450 [IU]/h via INTRAVENOUS
  Filled 2023-11-18 (×3): qty 250

## 2023-11-18 MED ORDER — IPRATROPIUM-ALBUTEROL 0.5-2.5 (3) MG/3ML IN SOLN: 3.0000 mL | Freq: Two times a day (BID) | RESPIRATORY_TRACT | Status: DC

## 2023-11-18 MED ORDER — BUDESONIDE 0.5 MG/2ML IN SUSP
0.5000 mg | Freq: Two times a day (BID) | RESPIRATORY_TRACT | Status: DC
  Administered 2023-11-18 – 2023-11-26 (×16): 0.5 mg via RESPIRATORY_TRACT
  Filled 2023-11-18 (×16): qty 2

## 2023-11-18 MED ORDER — METHYLPREDNISOLONE SODIUM SUCC 125 MG IJ SOLR
60.0000 mg | Freq: Two times a day (BID) | INTRAMUSCULAR | Status: DC
  Administered 2023-11-18 – 2023-11-20 (×5): 60 mg via INTRAVENOUS
  Filled 2023-11-18 (×5): qty 2

## 2023-11-18 NOTE — TOC Initial Note (Signed)
 Transition of Care [facility]) - Initial/Assessment Note    Patient Details  Name: [name] [name] MRN: [id number] Date of Birth: June 22, 1961  Transition of Care [facility]) CM/SW Contact:    Juanda Noon Phone Number: 11/18/2023, 8:04 AM  Clinical Narrative:                  Patient is at risk for readmission . Patient was admitted for Pneumonia, organism unspecified. CSW assessed patient . Patient lives at home with spouse. Patient states that she is independent; She states that she has equipment in her bathroom , a WC, and a walker. Patient states that her husband does most of the driving. Patient has had HH in the past but unable to recall agency. TOC will continue to follow.    Expected Discharge Plan: Home/Self Care Barriers to Discharge: Continued Medical Work up   Patient Goals and CMS Choice Patient states their goals for this hospitalization and ongoing recovery are:: DC back home CMS Medicare.gov Compare Post Acute Care list provided to:: Patient        Expected Discharge Plan and Services In-house Referral: Clinical Social Work Discharge Planning Services: CM Consult Post Acute Care Choice: Durable Medical Equipment Living arrangements for the past 2 months: Single Family Home                                      Prior Living Arrangements/Services Living arrangements for the past 2 months: Single Family Home Lives with:: Spouse Patient language and need for interpreter reviewed:: Yes Do you feel safe going back to the place where you live?: Yes      Need for Family Participation in Patient Care: Yes (Comment) Care giver support system in place?: No (comment) Current home services: DME Criminal Activity/Legal Involvement Pertinent to Current Situation/Hospitalization: No - Comment as needed  Activities of Daily Living      Permission Sought/Granted      Share Information with NAME: Marti     Permission granted to share info w Relationship:  Patient     Emotional Assessment Appearance:: Appears stated age Attitude/Demeanor/Rapport: Engaged, Self-Confident Affect (typically observed): Accepting Orientation: : Oriented to Self, Oriented to Place, Oriented to  Time, Oriented to Situation Alcohol / Substance Use: Tobacco Use    Admission diagnosis:  Pneumonia, organism unspecified(486) [J18.9] Patient Active Problem List   Diagnosis Date Noted   Pneumonia, organism unspecified(486) 11/17/2023   COPD with acute exacerbation (HCC) 11/17/2023   Acute on chronic hypoxic respiratory failure (HCC) 11/17/2023   Opioid dependence on agonist therapy (HCC) 12/24/2021   Substance abuse (HCC) 10/03/2021   Edema of right upper arm 10/03/2021   Elevated d-dimer 10/03/2021   Cervical spondylosis with radiculopathy 08/15/2021   Nocturnal hypoxemia 07/10/2021   Unstable angina (HCC)    Chest pain 04/16/2021   COPD  GOLD 0/ active smoker    Sciatica 08/25/2016   Obstructive chronic bronchitis with acute bronchitis (HCC) 06/04/2016   Community acquired pneumonia 04/09/2014   Pneumonia 04/09/2014   Tobacco use disorder 02/14/2014   Other emphysema (HCC) 01/11/2013   Dysuria 11/18/2011   Family history of colon cancer 11/18/2011   GERD (gastroesophageal reflux disease) 09/18/2009   Dysphagia 09/18/2009   EPIGASTRIC PAIN 09/18/2009   Cigarette smoker 09/15/2009   Migraine headache 09/15/2009   Coronary atherosclerosis 09/15/2009   Cerebral artery occlusion with cerebral infarction (HCC) 09/15/2009   WEIGHT LOSS  09/15/2009   Nausea with vomiting 09/15/2009   URINARY FREQUENCY 09/15/2009   ABDOMINAL PAIN OTHER SPECIFIED SITE 09/15/2009   PULMONARY EMBOLISM, HX OF 09/15/2009   CONSTIPATION, CHRONIC, HX OF 09/15/2009   RENAL CALCULUS, HX OF 09/15/2009   PCP:  [name], PA-C Pharmacy:   [facility] - [place] - [address] [place] [address] Phone: [phone] Fax: [phone]     Social  Drivers of Health (SDOH) Social History: SDOH Screenings   Food Insecurity: No Food Insecurity (11/18/2023)  Transportation Needs: No Transportation Needs (08/23/2020)   [facility], [facility]  Social Connections: Unknown (11/13/2021)   [facility], [facility]  Tobacco Use: High Risk (11/17/2023)   SDOH Interventions:     Readmission Risk Interventions    11/18/2023    8:03 AM  Readmission Risk Prevention Plan  Transportation Screening Complete  HRI or Home Care Consult Complete  Social Work Consult for Recovery Care Planning/Counseling Complete  Palliative Care Screening Not Applicable  Medication Review Oceanographer) Complete

## 2023-11-18 NOTE — Progress Notes (Addendum)
 Responded to nursing call:  sob and increase oxygen  demand   Subjective: Pt state she is more sob than this am since taking a walk.  Denies cp, n/v/d, abd pain.  No f/c  Vitals:   11/18/23 0704 11/18/23 1300 11/18/23 1434 11/18/23 1454  BP:  124/76    Pulse:  78    Resp:  (!) 26 20   Temp:  98.6 F (37 C)    TempSrc:  Oral    SpO2: 90% (!) 89% (!) 89% 90%  Weight:      Height:       CV--RRR Lung--bilateral rales.  Bilateral exp wheeze Abd--soft+BS/NT Ext--no edema, no rash   Assessment/Plan: Acute on chronic resp failure with hypoxia -due to COPD ex -on 3L at home -up to 10L -aim for saturation >88% -transfer to SDU -give additional alb neb now -f/u viral resp panel -check COVID/Flu/RSV  CAP -continue ceftriaxone Annamarie Barrier -MRSA screen   COPD exacerbation -increase pulmicort  to 0.5 mg bid -add yupelri -continue duoneb -add brovana -continue solumedrol -check VBG   ADDENDUM 1652 -SPOKE WITH RADIOLOGIST, DR. HENN REGARDING CTA CHEST -SMALL RUL PE -WORSENING BILATERAL INFILTRATES >>>start IV heparin  >>>Echo >>>broaden abx coverage>>Zosyn >>>MRSA screen >>>continue azithro    The patient is critically ill with multiple organ systems failure and requires high complexity decision making for assessment and support, frequent evaluation and titration of therapies, application of advanced monitoring technologies and extensive interpretation of multiple databases.  Critical care time - 35 mins.    Demaris Fillers, DO [facility]

## 2023-11-18 NOTE — Progress Notes (Signed)
 Progress Note   Patient: [name] [name] [id number]:[id number] DOB: 03/04/1961 DOA: 11/17/2023     1 DOS: the patient was seen and examined on 11/18/2023   Brief hospital course: [name] [name] is a 63 y.o. female with medical history significant for ongoing tobacco abuse, COPD with chronic hypoxic respiratory failure on 3 L of oxygen  via nasal cannula PTA, prior history of pulmonary embolism, CAD with prior angioplasty and stent placement in 2001, GERD with prior history of duodenal ulcers, and prior history of CVA presents to the ED with worsening shortness of breath and worsening hypoxia.   Patient is admitted to TRH service for further management evaluation of acute on chronic hypoxic respiratory failure due to community-acquired pneumonia, COPD exacerbation  Assessment and Plan: Acute on chronic hypoxic respiratory failure Community-acquired pneumonia Acute COPD exacerbation Patient does have high oxygen  requirement than her usual 3 L at home.  She is currently on 6 to 7 L saturating 88%. WBC 17k,  CTA chest rule out PE ordered, 20 pathogen viral panel ordered. Patient will be continued on empiric Rocephin  and azithromycin  therapy. Continue bronchodilators scheduled. Continue Mucinex . IV Solu-Medrol  40 mg dose increased to every 8 hours. Given her deterioration in clinical status we will move her to a stepdown unit.  Hyponatremia - Improved. Hypokalemia- s/p oral repletion. Will get  repeat BMP. Encourage oral diet, fluids.  Acute on chronic anemia- Repeat CBC, check iron profile, b12, folate levels. Occult stool pending. Reports no active bleeding. Monitor h/h daily.  Chronic pain Syndrome/chronic Opiate Dependence--- patient with history of snorting hydrocodone and other opiates in the past, continue Subutex . Continue gabapentin   GERD continue PPI.  Tobacco abuse- Smoking cessation counseled. Nicotine  patch ordered,      Out of bed to chair. Incentive spirometry. Nursing  supportive care. Fall, aspiration precautions. Diet:  Diet Orders (From admission, onward)     Start     Ordered   11/17/23 1019  Diet Heart Room service appropriate? Yes; Fluid consistency: Thin  Diet effective now       Question Answer Comment  Room service appropriate? Yes   Fluid consistency: Thin      11/17/23 1019           DVT prophylaxis: heparin  injection 5,000 Units Start: 11/17/23 2200 SCDs Start: 11/17/23 1019 Place TED hose Start: 11/17/23 1019  Level of care: Telemetry   Code Status: Full Code  Subjective: Patient is seen and examined today morning. She has shortness of breath, more chest discomfort on right side with deep breath. Has low appetite. Weak, not eating well.  Physical Exam: Vitals:   11/17/23 2041 11/18/23 0423 11/18/23 0704 11/18/23 1300  BP: 126/79 128/88  124/76  Pulse: 87 64  78  Resp:    (!) 26  Temp: 98.2 F (36.8 C) 97.7 F (36.5 C)  98.6 F (37 C)  TempSrc: Oral Oral  Oral  SpO2: 91% 91% 90% (!) 89%  Weight:      Height:        General - Elderly Caucasian ill female, moderate respiratory distress HEENT - PERRLA, EOMI, atraumatic head, non tender sinuses. Lung - Clear, basal rales, diffuse wheezes, using accessory muscles. Heart - S1, S2 heard, no murmurs, rubs, trace pedal edema. Abdomen - Soft, non tender, bowel sounds good Neuro - Alert, awake and oriented x 3, non focal exam. Skin - Warm and dry.  Data Reviewed:      Latest Ref Rng & Units 11/17/2023    9:47  AM 11/17/2023    8:32 AM 11/14/2023   12:03 PM  CBC  WBC 4.0 - 10.5 K/uL  17.3  13.8   Hemoglobin 12.0 - 15.0 g/dL 13.0  86.5  78.4   Hematocrit 36.0 - 46.0 % 31.0  32.5  38.3   Platelets 150 - 400 K/uL  311  220       Latest Ref Rng & Units 11/17/2023    9:47 AM 11/17/2023    8:32 AM 11/14/2023   12:03 PM  BMP  Glucose 70 - 99 mg/dL 696  295  284   BUN 8 - 23 mg/dL 16  18  14    Creatinine 0.44 - 1.00 mg/dL 1.32  4.40  1.02   Sodium 135 - 145 mmol/L 137  134   134   Potassium 3.5 - 5.1 mmol/L 3.3  3.4  3.3   Chloride 98 - 111 mmol/L 98  97  95   CO2 22 - 32 mmol/L  28  25   Calcium  8.9 - 10.3 mg/dL  8.4  9.1    DG Chest Port 1 View Result Date: 11/17/2023 CLINICAL DATA:  Shortness of breath. EXAM: PORTABLE CHEST 1 VIEW COMPARISON:  11/14/2023. FINDINGS: There are heterogeneous opacities at the bilateral lung bases, slightly more pronounced than the prior exam. Findings may represent combination of atelectasis and or pneumonitis. Correlate clinically. Redemonstration of blunting of right lateral costophrenic angle, which may represent trace right pleural effusion. No significant interval change. No significant left pleural effusion. No pneumothorax on either side. Stable cardio-mediastinal silhouette. No acute osseous abnormalities. Partially seen lower cervical spinal fixation hardware. The soft tissues are within normal limits. IMPRESSION: *Heterogeneous opacities at the bilateral lung bases, slightly more pronounced than the prior exam. Findings may represent combination of atelectasis and or pneumonitis. Electronically Signed   By: [name] [name] M.D.   On: 11/17/2023 08:33    Family Communication: Discussed with patient, husband at bedside. They understand and agree. All questions answered.  Disposition: Status is: Inpatient Remains inpatient appropriate because: IV antibiotics, hypoxia, pleuritic chest pain  Planned Discharge Destination: Home with Home Health     MDM level 3- She has right sided chest pain, hypoxia, bilateral infiltrates, now on 6-7L supplemental O2, need IV antibiotics, IV steroids, close monitoring in step down unit. Sh eis at high risk for sudden clinical deterioration.  Author: Aisha Hove, MD 11/18/2023 2:28 PM Secure chat 7am to 7pm For on call review [email or web address].

## 2023-11-18 NOTE — Progress Notes (Signed)
 Mobility Specialist Progress Note:    11/18/23 1109  Mobility  Activity Ambulated with assistance in hallway  Level of Assistance Standby assist, set-up cues, supervision of patient - no hands on  Assistive Device None  Distance Ambulated (ft) 50 ft  Range of Motion/Exercises Active;All extremities  Activity Response Tolerated well  Mobility Referral Yes  Mobility visit 1 Mobility  Mobility Specialist Start Time (ACUTE ONLY) 1010  Mobility Specialist Stop Time (ACUTE ONLY) 1037  Mobility Specialist Time Calculation (min) (ACUTE ONLY) 27 min   Pt received in bed, family at bedside. Agreeable to mobility, required supervision to stand and ambulate with no AD. Tolerated well, c/o CP after ambulation. Vital signs stable, notified RN. Left pt supine, all needs met.  Glinda Lapping Mobility Specialist Please contact via Special educational needs teacher or  Rehab office at [phone]

## 2023-11-18 NOTE — Progress Notes (Signed)
 Notified by NT that patient was tachypnic and O2 sats marginal on recent set of vitals. Went in to assess patient and noticed increased WOB while resting. Placed continuous pulse ox on patient and sats were 82-83%. Increased supplemental oxygen  from 4L to 6L and notified RT. MD made aware and orders received. RT at the bedside.

## 2023-11-18 NOTE — Plan of Care (Signed)
 Pt frequently drowsy but easily aroused. Pt reports she sleeps heavy.Vitals stable. Pt received runs of potassium plus po potassium. Walks 1 assist to bathroom. Oxygen  on.  Problem: Education: Goal: Knowledge of General Education information will improve Description: Including pain rating scale, medication(s)/side effects and non-pharmacologic comfort measures Outcome: Progressing   Problem: Health Behavior/Discharge Planning: Goal: Ability to manage health-related needs will improve Outcome: Progressing   Problem: Clinical Measurements: Goal: Ability to maintain clinical measurements within normal limits will improve Outcome: Progressing Goal: Will remain free from infection Outcome: Progressing Goal: Diagnostic test results will improve Outcome: Progressing Goal: Respiratory complications will improve Outcome: Progressing Goal: Cardiovascular complication will be avoided Outcome: Progressing   Problem: Activity: Goal: Risk for activity intolerance will decrease Outcome: Progressing   Problem: Nutrition: Goal: Adequate nutrition will be maintained Outcome: Progressing   Problem: Coping: Goal: Level of anxiety will decrease Outcome: Progressing   Problem: Elimination: Goal: Will not experience complications related to bowel motility Outcome: Progressing Goal: Will not experience complications related to urinary retention Outcome: Progressing   Problem: Pain Managment: Goal: General experience of comfort will improve and/or be controlled Outcome: Progressing   Problem: Safety: Goal: Ability to remain free from injury will improve Outcome: Progressing   Problem: Skin Integrity: Goal: Risk for impaired skin integrity will decrease Outcome: Progressing

## 2023-11-18 NOTE — Progress Notes (Signed)
 PHARMACY - ANTICOAGULATION CONSULT NOTE  Pharmacy Consult for heparin  Indication: pulmonary embolus  Allergies  Allergen Reactions   Codeine     Hydrocodone Nausea And Vomiting   Sulfa Antibiotics Hives and Itching   Hydrocodone-Acetaminophen  Nausea And Vomiting and Anxiety    Patient Measurements: Height: 5\' 4"  (162.6 cm) Weight: 63.3 kg (139 lb 8.8 oz) IBW/kg (Calculated) : 54.7 HEPARIN  DW (KG): 63.3  Vital Signs: Temp: 98.7 F (37.1 C) (05/20 1632) Temp Source: Oral (05/20 1632) BP: 124/76 (05/20 1300) Pulse Rate: 86 (05/20 1634)  Labs: Recent Labs    11/17/23 0832 11/17/23 0947 11/17/23 1151 11/18/23 1642  HGB 10.9* 10.5*  --  11.2*  HCT 32.5* 31.0*  --  35.1*  PLT 311  --   --  333  CREATININE 0.84 0.80  --   --   TROPONINIHS 5  --  4  --     Estimated Creatinine Clearance: 61.3 mL/min (by C-G formula based on SCr of 0.8 mg/dL).   Medical History: Past Medical History:  Diagnosis Date   Acid reflux    Arthritis    CAD (coronary artery disease)    S/P cath with stent in 2001   CHF (congestive heart failure) (HCC)    Collagen vascular disease (HCC)    COPD (chronic obstructive pulmonary disease) (HCC)    CVA (cerebral vascular accident) (HCC) 1992   DU (duodenal ulcer) 09/19/2009   Hiatal hernia    EGD september 2007. normal except for small hiatal hernia. She underwent small bowel biopsy with history of diarrhea at that time. This was negative   History of colonoscopy    August 2007. She had a pedunculated polyp at 30 cm. which was hamartomatous polyp. She  is due for 5-year followup with family history of coln cancer in a brother at age 26   History of colonoscopy    2005 revealed a linear ulcer with scar versus inflammatory process at the mid right colon, but normal terminal ileum. A biopsy of this area revealed an ulceration, but nonspecific   History of coronary angiogram    CT angiogram in 2007. negative with normal mesenteric arteries   History  of kidney stones    Migraine    Pneumonia    pt states she gets pneumonia about twice a year   Pulmonary embolus (HCC) 1999   Ulcerative colitis (HCC)    Urinary frequency     Medications:  Medications Prior to Admission  Medication Sig Dispense Refill Last Dose/Taking   albuterol  (PROVENTIL  HFA;VENTOLIN  HFA) 108 (90 BASE) MCG/ACT inhaler Inhale 2 puffs into the lungs every 6 (six) hours as needed for shortness of breath or wheezing.      albuterol  (PROVENTIL ) (2.5 MG/3ML) 0.083% nebulizer solution Take 3 mLs (2.5 mg total) by nebulization every 4 (four) hours as needed for wheezing or shortness of breath.      aspirin  EC 81 MG tablet Take 81 mg by mouth daily. Swallow whole.      budeson-glycopyrrolate-formoterol  (BREZTRI  AEROSPHERE) 160-9-4.8 MCG/ACT AERO Inhale 2 puffs into the lungs in the morning and at bedtime. 1 each     buprenorphine  (SUBUTEX ) 8 MG SUBL SL tablet Place 8 mg under the tongue in the morning and at bedtime.      buPROPion  (WELLBUTRIN  SR) 150 MG 12 hr tablet Take 150 mg by mouth every morning.      divalproex  (DEPAKOTE ) 250 MG DR tablet Take 250 mg by mouth daily.      famotidine  (PEPCID )  20 MG tablet One after supper (Patient taking differently: Take 20 mg by mouth at bedtime. One after supper) 30 tablet 11    gabapentin  (NEURONTIN ) 300 MG capsule Take 300 mg by mouth 3 (three) times daily.      guaiFENesin  (MUCINEX ) 600 MG 12 hr tablet Take 1 tablet (600 mg total) by mouth 2 (two) times daily. 12 tablet 0    ondansetron  (ZOFRAN ) 4 MG tablet Take 1 tablet (4 mg total) by mouth every 4 (four) hours as needed for nausea or vomiting. 5 tablet 0    ondansetron  (ZOFRAN -ODT) 8 MG disintegrating tablet Take 8 mg by mouth every 6 (six) hours as needed for vomiting or nausea.      OXYGEN  Inhale 4 L into the lungs continuous as needed (shortness of breath).      pantoprazole  (PROTONIX ) 40 MG tablet Take 1 tablet (40 mg total) by mouth daily. Take 30-60 min before first meal of  the day 30 tablet 2    promethazine  (PHENERGAN ) 25 MG tablet Take 25 mg by mouth every 6 (six) hours as needed.      QUEtiapine  (SEROQUEL ) 100 MG tablet Take 100 mg by mouth at bedtime.       Assessment: Pharmacy consulted to dose heparin  in patient with pulmonary embolism.  Patient is not on anticoagulation prior to admission but has been getting subq heparin  with last dose 5/20 1351.    CBC WNL  Goal of Therapy:  Heparin  level 0.3-0.7 units/ml Monitor platelets by anticoagulation protocol: Yes   Plan:  Give 2500 units bolus x 1 Start heparin  infusion at 1050 units/hr Check anti-Xa level in 6 hours and daily while on heparin  Continue to monitor H&H and platelets  Cliffton Dama, PharmD Clinical Pharmacist 11/18/2023 5:01 PM

## 2023-11-19 ENCOUNTER — Other Ambulatory Visit (HOSPITAL_COMMUNITY): Payer: Self-pay | Admitting: *Deleted

## 2023-11-19 ENCOUNTER — Inpatient Hospital Stay (HOSPITAL_COMMUNITY)

## 2023-11-19 DIAGNOSIS — R079 Chest pain, unspecified: Secondary | ICD-10-CM

## 2023-11-19 DIAGNOSIS — I2699 Other pulmonary embolism without acute cor pulmonale: Secondary | ICD-10-CM

## 2023-11-19 DIAGNOSIS — J189 Pneumonia, unspecified organism: Secondary | ICD-10-CM | POA: Diagnosis not present

## 2023-11-19 LAB — BASIC METABOLIC PANEL WITH GFR
Anion gap: 8 (ref 5–15)
BUN: 20 mg/dL (ref 8–23)
CO2: 26 mmol/L (ref 22–32)
Calcium: 8.1 mg/dL — ABNORMAL LOW (ref 8.9–10.3)
Chloride: 100 mmol/L (ref 98–111)
Creatinine, Ser: 0.71 mg/dL (ref 0.44–1.00)
GFR, Estimated: >60 mL/min (ref >60)
Glucose, Bld: 132 mg/dL — ABNORMAL HIGH (ref 70–99)
Potassium: 4.3 mmol/L (ref 3.5–5.1)
Sodium: 138 mmol/L (ref 135–145)

## 2023-11-19 LAB — CBC
HCT: 34.7 % — ABNORMAL LOW (ref 36.0–46.0)
Hemoglobin: 11.6 g/dL — ABNORMAL LOW (ref 12.0–15.0)
MCH: 30.2 pg (ref 26.0–34.0)
MCHC: 33.4 g/dL (ref 30.0–36.0)
MCV: 90.4 fL (ref 80.0–100.0)
Platelets: 351 10*3/uL (ref 150–400)
RBC: 3.84 MIL/uL — ABNORMAL LOW (ref 3.87–5.11)
RDW: 13.9 % (ref 11.5–15.5)
WBC: 24.5 10*3/uL — ABNORMAL HIGH (ref 4.0–10.5)
nRBC: 0.1 % (ref 0.0–0.2)

## 2023-11-19 LAB — ECHOCARDIOGRAM COMPLETE
AR max vel: 2.32 cm2
AV Area VTI: 2.3 cm2
AV Area mean vel: 2.14 cm2
AV Mean grad: 3 mmHg
AV Peak grad: 6.3 mmHg
Ao pk vel: 1.25 m/s
Area-P 1/2: 5.88 cm2
Calc EF: 68.9 %
Height: 64 in
MV M vel: 5.99 m/s
MV Peak grad: 143.3 mmHg
MV VTI: 1.95 cm2
S' Lateral: 2.9 cm
Single Plane A2C EF: 71.1 %
Single Plane A4C EF: 69.4 %
Weight: 2232.82 [oz_av]

## 2023-11-19 LAB — HEPARIN LEVEL (UNFRACTIONATED)
Heparin Unfractionated: 0.22 [IU]/mL — ABNORMAL LOW (ref 0.30–0.70)
Heparin Unfractionated: 0.34 [IU]/mL (ref 0.30–0.70)
Heparin Unfractionated: 0.14 [IU]/mL — ABNORMAL LOW (ref 0.30–0.70)

## 2023-11-19 MED ORDER — HEPARIN BOLUS VIA INFUSION
2000.0000 [IU] | Freq: Once | INTRAVENOUS | Status: AC
  Administered 2023-11-19: 2000 [IU] via INTRAVENOUS
  Filled 2023-11-19: qty 2000

## 2023-11-19 NOTE — Progress Notes (Signed)
 PHARMACY - ANTICOAGULATION CONSULT NOTE  Pharmacy Consult for heparin  Indication: pulmonary embolus  Labs: Recent Labs    11/17/23 0832 11/17/23 0947 11/17/23 1151 11/18/23 1642 11/19/23 0008  HGB 10.9* 10.5*  --  11.2* 11.6*  HCT 32.5* 31.0*  --  35.1* 34.7*  PLT 311  --   --  333 351  HEPARINUNFRC  --   --   --   --  0.22*  CREATININE 0.84 0.80  --  0.74  --   TROPONINIHS 5  --  4  --   --    Assessment: 62yo female subtherapeutic on heparin  with initial dosing for PE; no infusion issues or signs of bleeding per RN.  Goal of Therapy:  Heparin  level 0.3-0.7 units/ml   Plan:  2000 units heparin  bolus. Increase heparin  infusion by 3 units/kg/hr to 1250 units/hr. Check level in 6 hours.   Lonnie Roberts, PharmD, BCPS 11/19/2023 1:07 AM

## 2023-11-19 NOTE — Progress Notes (Signed)
 PHARMACY - ANTICOAGULATION CONSULT NOTE  Pharmacy Consult for heparin  Indication: pulmonary embolus  Allergies  Allergen Reactions   Codeine     Hydrocodone Nausea And Vomiting   Sulfa Antibiotics Hives and Itching   Hydrocodone-Acetaminophen  Nausea And Vomiting and Anxiety    Patient Measurements: Height: 5\' 4"  (162.6 cm) Weight: 63.3 kg (139 lb 8.8 oz) IBW/kg (Calculated) : 54.7 HEPARIN  DW (KG): 63.3  Vital Signs: Temp: 99.4 F (37.4 C) (05/21 0728) Temp Source: Axillary (05/21 0728) BP: 132/82 (05/21 0800) Pulse Rate: 64 (05/21 0800)  Labs: Recent Labs    11/17/23 0832 11/17/23 0947 11/17/23 1151 11/18/23 1642 11/19/23 0008 11/19/23 0815  HGB 10.9* 10.5*  --  11.2* 11.6*  --   HCT 32.5* 31.0*  --  35.1* 34.7*  --   PLT 311  --   --  333 351  --   HEPARINUNFRC  --   --   --   --  0.22* 0.34  CREATININE 0.84 0.80  --  0.74 0.71  --   TROPONINIHS 5  --  4  --   --   --     Estimated Creatinine Clearance: 61.3 mL/min (by C-G formula based on SCr of 0.71 mg/dL).   Medical History: Past Medical History:  Diagnosis Date   Acid reflux    Arthritis    CAD (coronary artery disease)    S/P cath with stent in 2001   CHF (congestive heart failure) (HCC)    Collagen vascular disease (HCC)    COPD (chronic obstructive pulmonary disease) (HCC)    CVA (cerebral vascular accident) (HCC) 1992   DU (duodenal ulcer) 09/19/2009   Hiatal hernia    EGD september 2007. normal except for small hiatal hernia. She underwent small bowel biopsy with history of diarrhea at that time. This was negative   History of colonoscopy    August 2007. She had a pedunculated polyp at 30 cm. which was hamartomatous polyp. She  is due for 5-year followup with family history of coln cancer in a brother at age 53   History of colonoscopy    2005 revealed a linear ulcer with scar versus inflammatory process at the mid right colon, but normal terminal ileum. A biopsy of this area revealed an  ulceration, but nonspecific   History of coronary angiogram    CT angiogram in 2007. negative with normal mesenteric arteries   History of kidney stones    Migraine    Pneumonia    pt states she gets pneumonia about twice a year   Pulmonary embolus (HCC) 1999   Ulcerative colitis (HCC)    Urinary frequency     Medications:  Medications Prior to Admission  Medication Sig Dispense Refill Last Dose/Taking   albuterol  (PROVENTIL  HFA;VENTOLIN  HFA) 108 (90 BASE) MCG/ACT inhaler Inhale 2 puffs into the lungs every 6 (six) hours as needed for shortness of breath or wheezing.      albuterol  (PROVENTIL ) (2.5 MG/3ML) 0.083% nebulizer solution Take 3 mLs (2.5 mg total) by nebulization every 4 (four) hours as needed for wheezing or shortness of breath.      aspirin  EC 81 MG tablet Take 81 mg by mouth daily. Swallow whole.      budeson-glycopyrrolate-formoterol  (BREZTRI  AEROSPHERE) 160-9-4.8 MCG/ACT AERO Inhale 2 puffs into the lungs in the morning and at bedtime. 1 each     buprenorphine  (SUBUTEX ) 8 MG SUBL SL tablet Place 8 mg under the tongue in the morning and at bedtime.  buPROPion  (WELLBUTRIN  SR) 150 MG 12 hr tablet Take 150 mg by mouth every morning.      divalproex  (DEPAKOTE ) 250 MG DR tablet Take 250 mg by mouth daily.      famotidine  (PEPCID ) 20 MG tablet One after supper (Patient taking differently: Take 20 mg by mouth at bedtime. One after supper) 30 tablet 11    gabapentin  (NEURONTIN ) 300 MG capsule Take 300 mg by mouth 3 (three) times daily.      guaiFENesin  (MUCINEX ) 600 MG 12 hr tablet Take 1 tablet (600 mg total) by mouth 2 (two) times daily. 12 tablet 0    ondansetron  (ZOFRAN ) 4 MG tablet Take 1 tablet (4 mg total) by mouth every 4 (four) hours as needed for nausea or vomiting. 5 tablet 0    ondansetron  (ZOFRAN -ODT) 8 MG disintegrating tablet Take 8 mg by mouth every 6 (six) hours as needed for vomiting or nausea.      OXYGEN  Inhale 4 L into the lungs continuous as needed  (shortness of breath).      pantoprazole  (PROTONIX ) 40 MG tablet Take 1 tablet (40 mg total) by mouth daily. Take 30-60 min before first meal of the day 30 tablet 2    promethazine  (PHENERGAN ) 25 MG tablet Take 25 mg by mouth every 6 (six) hours as needed.      QUEtiapine  (SEROQUEL ) 100 MG tablet Take 100 mg by mouth at bedtime.       Assessment: Pharmacy consulted to dose heparin  in patient with pulmonary embolism.  Patient is not on anticoagulation prior to admission but has been getting subq heparin  with last dose 5/20 1351.    HL 0.34- low end of therapeutic CBC WNL  Goal of Therapy:  Heparin  level 0.3-0.7 units/ml Monitor platelets by anticoagulation protocol: Yes   Plan:  Heparin  infusion at 1300 units/hr Check anti-Xa level in 6 hours and daily Continue to monitor H&H and platelets    Cliffton Dama, PharmD Clinical Pharmacist 11/19/2023 10:04 AM

## 2023-11-19 NOTE — Progress Notes (Signed)
  Echocardiogram 2D Echocardiogram has been performed.  Viviana Trimble L Jalik Gellatly RDCS 11/19/2023, 1:37 PM

## 2023-11-19 NOTE — Progress Notes (Signed)
 PHARMACY - ANTICOAGULATION CONSULT NOTE  Pharmacy Consult for heparin  Indication: pulmonary embolus  Allergies  Allergen Reactions   Codeine     Hydrocodone Nausea And Vomiting   Sulfa Antibiotics Hives and Itching   Hydrocodone-Acetaminophen  Nausea And Vomiting and Anxiety    Patient Measurements: Height: 5\' 4"  (162.6 cm) Weight: 63.3 kg (139 lb 8.8 oz) IBW/kg (Calculated) : 54.7 HEPARIN  DW (KG): 63.3  Vital Signs: Temp: 99.4 F (37.4 C) (05/21 0728) Temp Source: Axillary (05/21 0728) BP: 127/96 (05/21 1500) Pulse Rate: 71 (05/21 1500)  Labs: Recent Labs    11/17/23 0832 11/17/23 0947 11/17/23 1151 11/18/23 1642 11/19/23 0008 11/19/23 0815 11/19/23 1630  HGB 10.9* 10.5*  --  11.2* 11.6*  --   --   HCT 32.5* 31.0*  --  35.1* 34.7*  --   --   PLT 311  --   --  333 351  --   --   HEPARINUNFRC  --   --   --   --  0.22* 0.34 0.14*  CREATININE 0.84 0.80  --  0.74 0.71  --   --   TROPONINIHS 5  --  4  --   --   --   --     Estimated Creatinine Clearance: 61.3 mL/min (by C-G formula based on SCr of 0.71 mg/dL).   Medical History: Past Medical History:  Diagnosis Date   Acid reflux    Arthritis    CAD (coronary artery disease)    S/P cath with stent in 2001   CHF (congestive heart failure) (HCC)    Collagen vascular disease (HCC)    COPD (chronic obstructive pulmonary disease) (HCC)    CVA (cerebral vascular accident) (HCC) 1992   DU (duodenal ulcer) 09/19/2009   Hiatal hernia    EGD september 2007. normal except for small hiatal hernia. She underwent small bowel biopsy with history of diarrhea at that time. This was negative   History of colonoscopy    August 2007. She had a pedunculated polyp at 30 cm. which was hamartomatous polyp. She  is due for 5-year followup with family history of coln cancer in a brother at age 1   History of colonoscopy    2005 revealed a linear ulcer with scar versus inflammatory process at the mid right colon, but normal terminal  ileum. A biopsy of this area revealed an ulceration, but nonspecific   History of coronary angiogram    CT angiogram in 2007. negative with normal mesenteric arteries   History of kidney stones    Migraine    Pneumonia    pt states she gets pneumonia about twice a year   Pulmonary embolus (HCC) 1999   Ulcerative colitis (HCC)    Urinary frequency     Assessment: Pharmacy consulted to dose heparin  in patient with pulmonary embolism.  Patient is not on anticoagulation prior to admission but has been getting subq heparin  with last dose 5/20 1351.    Heparin  level low this evening at 0.14 on 1300 units/hr. No bleeding or IV issues noted.   Goal of Therapy:  Heparin  level 0.3-0.7 units/ml Monitor platelets by anticoagulation protocol: Yes   Plan:  Rebolus with heparin  2000 units then increase heparin  rate to 1450 units/hr Check anti-Xa level in 6-8 hours Continue to monitor H&H and platelets  Audra Blend PharmD., BCPS Clinical Pharmacist 11/19/2023 5:16 PM

## 2023-11-19 NOTE — Progress Notes (Signed)
 [facility] PROGRESS NOTE  [name] [name] (DOB: [birth year]) ZOX:[id number] PCP: [name], PA-C  Brief Narrative: [name] [name] is a 63 y.o. 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 Had worsening 5/20, transferred to SDU. Pulmonary embolism seen as well as worsening infiltrates on CTA for which heparin  was started and antibiotic coverage was broadened.   Subjective: Breathing stable over past 12 hours or so, still very short of breath and severe cough is persistent.   Objective: BP (!) 127/96   Pulse 71   Temp 99.4 F (37.4 C) (Axillary)   Resp 15   Ht 5\' 4"  (1.626 m)   Wt 63.3 kg   SpO2 92%   BMI 23.95 kg/m   Gen: Appears older than stated age in mild distress Pulm: Crackles and expiratory wheezes bilaterally, tachypneic without retractions on 15L   CV: RRR, no MRG or pitting edema GI: Soft, NT, ND, +BS  Neuro: Alert and oriented. No new focal deficits. Ext: Warm, no deformities Skin: No open wounds on visualized skin   Assessment & Plan: Acute on chronic hypoxic respiratory failure: VBG reassuring.  - Continue supplemental oxygen  to maintain normal WOB and SpO2 >89%.  - Consider PCCM consult if not responding to changes in therapy initiated in past 24 hours. Pt is full code.  - Treat underlying conditions as below.  - BNP elevated, echo pending though appears grossly euvolemic.   Acute RUL PE: No evidence of RV strain radiographically, VSS.  - Started heparin .  - Echo pending - This is 2nd VTE for pt (first was in 90's, was on coumadin for months), will plan to transition to DOAC and continue indefinitely.  - LE venous U/S  Progressive worsening pneumonia:  - MRSA PCR neg. Continue zosyn which would help cover in the event of aspiration.  -  Monitor cultures - Mucolytic - Note +rhinovirus, droplet precautions.   AECOPD: No wheezing this morning.  - Continue IV steroids and inhaled bronchodilators (scheduled and prn)  Tobacco use:  - Cessation counseling provided  GERD:  - Continue PPI  Chronic pain:  - Continue buprenorphine  and gabapentin   Hypokalemia: Resolved  Hyponatremia: Resolved  [name], MD [facility] [email or web address] 11/19/2023, 4:55 PM

## 2023-11-19 NOTE — Plan of Care (Signed)

## 2023-11-19 NOTE — Plan of Care (Signed)
  Problem: Clinical Measurements: Goal: Ability to maintain clinical measurements within normal limits will improve Outcome: Progressing Goal: Will remain free from infection Outcome: Progressing Goal: Diagnostic test results will improve Outcome: Progressing Goal: Respiratory complications will improve Outcome: Progressing Goal: Cardiovascular complication will be avoided Outcome: Progressing   Problem: Coping: Goal: Level of anxiety will decrease Outcome: Progressing   Problem: Elimination: Goal: Will not experience complications related to bowel motility Outcome: Progressing Goal: Will not experience complications related to urinary retention Outcome: Progressing   Problem: Pain Managment: Goal: General experience of comfort will improve and/or be controlled Outcome: Progressing   Problem: Safety: Goal: Ability to remain free from injury will improve Outcome: Progressing   Problem: Skin Integrity: Goal: Risk for impaired skin integrity will decrease Outcome: Progressing

## 2023-11-20 ENCOUNTER — Inpatient Hospital Stay (HOSPITAL_COMMUNITY)

## 2023-11-20 ENCOUNTER — Other Ambulatory Visit (HOSPITAL_COMMUNITY): Payer: Self-pay

## 2023-11-20 ENCOUNTER — Telehealth (HOSPITAL_COMMUNITY): Payer: Self-pay | Admitting: Pharmacy Technician

## 2023-11-20 DIAGNOSIS — J189 Pneumonia, unspecified organism: Secondary | ICD-10-CM | POA: Diagnosis not present

## 2023-11-20 LAB — BASIC METABOLIC PANEL WITH GFR
Anion gap: 7 (ref 5–15)
BUN: 20 mg/dL (ref 8–23)
CO2: 26 mmol/L (ref 22–32)
Calcium: 7.8 mg/dL — ABNORMAL LOW (ref 8.9–10.3)
Chloride: 99 mmol/L (ref 98–111)
Creatinine, Ser: 0.63 mg/dL (ref 0.44–1.00)
GFR, Estimated: >60 mL/min (ref >60)
Glucose, Bld: 125 mg/dL — ABNORMAL HIGH (ref 70–99)
Potassium: 4 mmol/L (ref 3.5–5.1)
Sodium: 132 mmol/L — ABNORMAL LOW (ref 135–145)

## 2023-11-20 LAB — CBC
HCT: 32.9 % — ABNORMAL LOW (ref 36.0–46.0)
Hemoglobin: 11.3 g/dL — ABNORMAL LOW (ref 12.0–15.0)
MCH: 30.5 pg (ref 26.0–34.0)
MCHC: 34.3 g/dL (ref 30.0–36.0)
MCV: 88.9 fL (ref 80.0–100.0)
Platelets: 403 10*3/uL — ABNORMAL HIGH (ref 150–400)
RBC: 3.7 MIL/uL — ABNORMAL LOW (ref 3.87–5.11)
RDW: 14.1 % (ref 11.5–15.5)
WBC: 24.9 10*3/uL — ABNORMAL HIGH (ref 4.0–10.5)
nRBC: 0 % (ref 0.0–0.2)

## 2023-11-20 LAB — HEPARIN LEVEL (UNFRACTIONATED)
Heparin Unfractionated: 0.48 [IU]/mL (ref 0.30–0.70)
Heparin Unfractionated: 0.54 [IU]/mL (ref 0.30–0.70)

## 2023-11-20 LAB — PROCALCITONIN: Procalcitonin: 0.23 ng/mL

## 2023-11-20 MED ORDER — PROCHLORPERAZINE EDISYLATE 10 MG/2ML IJ SOLN
10.0000 mg | Freq: Four times a day (QID) | INTRAMUSCULAR | Status: DC | PRN
  Administered 2023-11-20 – 2023-11-23 (×6): 10 mg via INTRAVENOUS
  Filled 2023-11-20 (×6): qty 2

## 2023-11-20 MED ORDER — APIXABAN 5 MG PO TABS
10.0000 mg | ORAL_TABLET | Freq: Two times a day (BID) | ORAL | Status: DC
Start: 2023-11-20 — End: 2023-11-27
  Administered 2023-11-20 – 2023-11-26 (×13): 10 mg via ORAL
  Filled 2023-11-20 (×13): qty 2

## 2023-11-20 MED ORDER — SENNOSIDES-DOCUSATE SODIUM 8.6-50 MG PO TABS
1.0000 | ORAL_TABLET | Freq: Every day | ORAL | Status: DC
  Administered 2023-11-20 – 2023-11-26 (×5): 1 via ORAL
  Filled 2023-11-20 (×6): qty 1

## 2023-11-20 MED ORDER — ORAL CARE MOUTH RINSE: 15.0000 mL | OROMUCOSAL | Status: DC | PRN

## 2023-11-20 MED ORDER — ORAL CARE MOUTH RINSE
15.0000 mL | OROMUCOSAL | Status: DC
  Administered 2023-11-20 – 2023-11-26 (×21): 15 mL via OROMUCOSAL

## 2023-11-20 MED ORDER — APIXABAN 5 MG PO TABS: 5.0000 mg | ORAL_TABLET | Freq: Two times a day (BID) | ORAL | Status: DC

## 2023-11-20 NOTE — Progress Notes (Signed)
 PHARMACY - ANTICOAGULATION CONSULT NOTE  Pharmacy Consult for heparin  Indication: pulmonary embolus  Labs: Recent Labs    11/17/23 0832 11/17/23 0947 11/17/23 1151 11/18/23 1642 11/18/23 1642 11/19/23 0008 11/19/23 0815 11/19/23 1630 11/20/23 0208  HGB 10.9*   < >  --  11.2*  --  11.6*  --   --  11.3*  HCT 32.5*   < >  --  35.1*  --  34.7*  --   --  32.9*  PLT 311  --   --  333  --  351  --   --  403*  HEPARINUNFRC  --   --   --   --    < > 0.22* 0.34 0.14* 0.48  CREATININE 0.84   < >  --  0.74  --  0.71  --   --  0.63  TROPONINIHS 5  --  4  --   --   --   --   --   --    < > = values in this interval not displayed.   Assessment/Plan:  63yo female therapeutic on heparin  after rate change. Will continue infusion at current rate of 1450 units/hr and confirm stable with additional level.  Lonnie Roberts, PharmD, BCPS 11/20/2023 2:53 AM

## 2023-11-20 NOTE — Progress Notes (Signed)
 Patient noted with no bowel movement since 5/18. Patient confirms. Patient declines PRN miralax . Says she hasn't had much to eat since 5/18 and isn't concerned with having a bowel movement. Patient with complaints of nausea. Dr. Fausto Hooker aware that IV zofran  wasn't effective. New order for IV compazine.

## 2023-11-20 NOTE — Progress Notes (Signed)
 [facility] PROGRESS NOTE  [name] [name] (DOB: [birth year]) OZH:[id number] PCP: [name], PA-C  Brief Narrative: [name] [name] is a 63 y.o. 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 Had worsening 5/20, transferred to SDU. Pulmonary embolism seen as well as worsening infiltrates on CTA for which heparin  was started and antibiotic coverage was broadened.   Subjective: Feels her breathing is easier this morning, still with cough and chest pain that is absent when not coughing. Using IS. Dyspnea improved. Main complaint this morning is nausea, given zofran  with incomplete relief.   Objective: BP 125/85   Pulse 74   Temp 98.3 F (36.8 C) (Oral)   Resp (!) 22   Ht 5\' 4"  (1.626 m)   Wt 63.3 kg   SpO2 97%   BMI 23.95 kg/m   Gen: Pleasant female appearing older than stated age Pulm: Coarse inspiratory and wheezing expiratory sounds, coughs with deep respiration.   CV: RRR, no MRG or edema GI: Soft, NT, ND, +BS Neuro: Alert and oriented. No new focal deficits. Ext: Warm, no deformities. Skin: No new rashes, lesions or ulcers on visualized skin   Assessment & Plan: Acute on chronic hypoxic respiratory failure: VBG reassuring.  - Continue supplemental oxygen  to maintain normal WOB and SpO2 >89%. Currently 20LPM HHFNC, wean as tolerated.  - Considered PCCM consult, though pt's respiratory status seems to have stabilized.  - Treat underlying conditions as below.  - BNP elevated, echo showed normal biventricular function.   Acute RUL PE: No evidence of RV strain radiographically or on echo, VSS.  - Started heparin , transition to DOAC - This is 2nd VTE for pt (first was in 90's, was on coumadin for months), will plan to transition to DOAC and continue indefinitely.   - LE venous U/S pending  Progressive worsening pneumonia:  - Continue zosyn which would help cover in the event of aspiration. MRSA PCR neg. Completing course of azithromycin  as well. Check PCT. - Monitor cultures (no sputum collected) - Mucolytic - Note +rhinovirus, droplet precautions.   AECOPD:  - Continue IV steroids and inhaled bronchodilators (scheduled and prn)  Nausea:  - Zofran , add compazine prn - Abd benign though limited stool output, will start senokot and continue encouraging ambulation.  Tobacco use:  - Cessation counseling provided  GERD:  - Continue PPI  Chronic pain:  - Continue buprenorphine  and gabapentin   Hypokalemia: Resolved  Hyponatremia: Monitoring  [name] Heckle, MD [facility] [email or web address] 11/20/2023, 11:31 AM

## 2023-11-20 NOTE — Discharge Instructions (Addendum)
 Information on my medicine - ELIQUIS (apixaban)  This medication education was reviewed with me or my healthcare representative as part of my discharge preparation.    Why was Eliquis prescribed for you? Eliquis was prescribed to treat blood clots that may have been found in the veins of your legs (deep vein thrombosis) or in your lungs (pulmonary embolism) and to reduce the risk of them occurring again.  What do You need to know about Eliquis ? The starting dose is 10 mg (two 5 mg tablets) taken TWICE daily for the FIRST SEVEN (7) DAYS, then on 11/27/23  the dose is reduced to ONE 5 mg tablet taken TWICE daily.  Eliquis may be taken with or without food.   Try to take the dose about the same time in the morning and in the evening. If you have difficulty swallowing the tablet whole please discuss with your pharmacist how to take the medication safely.  Take Eliquis exactly as prescribed and DO NOT stop taking Eliquis without talking to the doctor who prescribed the medication.  Stopping may increase your risk of developing a new blood clot.  Refill your prescription before you run out.  After discharge, you should have regular check-up appointments with your healthcare provider that is prescribing your Eliquis.    What do you do if you miss a dose? If a dose of ELIQUIS is not taken at the scheduled time, take it as soon as possible on the same day and twice-daily administration should be resumed. The dose should not be doubled to make up for a missed dose.  Important Safety Information A possible side effect of Eliquis is bleeding. You should call your healthcare provider right away if you experience any of the following: Bleeding from an injury or your nose that does not stop. Unusual colored urine (red or dark brown) or unusual colored stools (red or black). Unusual bruising for unknown reasons. A serious fall or if you hit your head (even if there is no bleeding).  Some  medicines may interact with Eliquis and might increase your risk of bleeding or clotting while on Eliquis. To help avoid this, consult your healthcare provider or pharmacist prior to using any new prescription or non-prescription medications, including herbals, vitamins, non-steroidal anti-inflammatory drugs (NSAIDs) and supplements.  This website has more information on Eliquis (apixaban): http://www.eliquis.com/eliquis/home

## 2023-11-20 NOTE — Telephone Encounter (Signed)
 Pharmacy Patient Advocate Encounter  Insurance verification completed.    The patient is insured through [facility]. Patient has Medicare and is not eligible for a copay card, but may be able to apply for patient assistance or Medicare RX Payment Plan (Patient Must reach out to their plan, if eligible for payment plan), if available.    Ran test claim for ELIQUIS 5MG  TABLETS and the current 30 day co-pay is $12.15.   This test claim was processed through [facility]- copay amounts may vary at other pharmacies due to pharmacy/plan contracts, or as the patient moves through the different stages of their insurance plan.

## 2023-11-20 NOTE — Progress Notes (Signed)
 PHARMACY - ANTICOAGULATION CONSULT NOTE  Pharmacy Consult for heparin  Indication: pulmonary embolus  Allergies  Allergen Reactions   Codeine     Hydrocodone Nausea And Vomiting   Sulfa Antibiotics Hives and Itching   Hydrocodone-Acetaminophen  Nausea And Vomiting and Anxiety    Patient Measurements: Height: 5\' 4"  (162.6 cm) Weight: 63.3 kg (139 lb 8.8 oz) IBW/kg (Calculated) : 54.7 HEPARIN  DW (KG): 63.3  Vital Signs: Temp: 98.3 F (36.8 C) (05/22 0733) Temp Source: Oral (05/22 0733) BP: 132/84 (05/22 1000) Pulse Rate: 93 (05/22 1000)  Labs: Recent Labs    11/17/23 1151 11/18/23 1642 11/18/23 1642 11/19/23 0008 11/19/23 0815 11/19/23 1630 11/20/23 0208 11/20/23 0943  HGB  --  11.2*   < > 11.6*  --   --  11.3*  --   HCT  --  35.1*  --  34.7*  --   --  32.9*  --   PLT  --  333  --  351  --   --  403*  --   HEPARINUNFRC  --   --   --  0.22*   < > 0.14* 0.48 0.54  CREATININE  --  0.74  --  0.71  --   --  0.63  --   TROPONINIHS 4  --   --   --   --   --   --   --    < > = values in this interval not displayed.    Estimated Creatinine Clearance: 61.3 mL/min (by C-G formula based on SCr of 0.63 mg/dL).   Medical History: Past Medical History:  Diagnosis Date   Acid reflux    Arthritis    CAD (coronary artery disease)    S/P cath with stent in 2001   CHF (congestive heart failure) (HCC)    Collagen vascular disease (HCC)    COPD (chronic obstructive pulmonary disease) (HCC)    CVA (cerebral vascular accident) (HCC) 1992   DU (duodenal ulcer) 09/19/2009   Hiatal hernia    EGD september 2007. normal except for small hiatal hernia. She underwent small bowel biopsy with history of diarrhea at that time. This was negative   History of colonoscopy    August 2007. She had a pedunculated polyp at 30 cm. which was hamartomatous polyp. She  is due for 5-year followup with family history of coln cancer in a brother at age 51   History of colonoscopy    2005 revealed a  linear ulcer with scar versus inflammatory process at the mid right colon, but normal terminal ileum. A biopsy of this area revealed an ulceration, but nonspecific   History of coronary angiogram    CT angiogram in 2007. negative with normal mesenteric arteries   History of kidney stones    Migraine    Pneumonia    pt states she gets pneumonia about twice a year   Pulmonary embolus (HCC) 1999   Ulcerative colitis (HCC)    Urinary frequency     Assessment: Pharmacy consulted to dose heparin  in patient with pulmonary embolism.  Patient is not on anticoagulation prior to admission but has been getting subq heparin  with last dose 5/20 1351.    HL 0.54- therapeutic CBC WNL  Goal of Therapy:  Heparin  level 0.3-0.7 units/ml Monitor platelets by anticoagulation protocol: Yes   Plan:  Continue heparin  infusion at 1450 units/hr Check anti-Xa level daily Continue to monitor H&H and platelets  Cliffton Dama, PharmD Clinical Pharmacist 11/20/2023 10:38 AM

## 2023-11-21 DIAGNOSIS — J189 Pneumonia, unspecified organism: Secondary | ICD-10-CM | POA: Diagnosis not present

## 2023-11-21 LAB — BASIC METABOLIC PANEL WITH GFR
Anion gap: 6 (ref 5–15)
BUN: 22 mg/dL (ref 8–23)
CO2: 28 mmol/L (ref 22–32)
Calcium: 8 mg/dL — ABNORMAL LOW (ref 8.9–10.3)
Chloride: 103 mmol/L (ref 98–111)
Creatinine, Ser: 0.64 mg/dL (ref 0.44–1.00)
GFR, Estimated: >60 mL/min (ref >60)
Glucose, Bld: 105 mg/dL — ABNORMAL HIGH (ref 70–99)
Potassium: 4.2 mmol/L (ref 3.5–5.1)
Sodium: 137 mmol/L (ref 135–145)

## 2023-11-21 LAB — CBC
HCT: 33.7 % — ABNORMAL LOW (ref 36.0–46.0)
Hemoglobin: 11.1 g/dL — ABNORMAL LOW (ref 12.0–15.0)
MCH: 30.1 pg (ref 26.0–34.0)
MCHC: 32.9 g/dL (ref 30.0–36.0)
MCV: 91.3 fL (ref 80.0–100.0)
Platelets: 414 10*3/uL — ABNORMAL HIGH (ref 150–400)
RBC: 3.69 MIL/uL — ABNORMAL LOW (ref 3.87–5.11)
RDW: 14.2 % (ref 11.5–15.5)
WBC: 22.2 10*3/uL — ABNORMAL HIGH (ref 4.0–10.5)
nRBC: 0 % (ref 0.0–0.2)

## 2023-11-21 MED ORDER — METHYLPREDNISOLONE SODIUM SUCC 40 MG IJ SOLR
40.0000 mg | Freq: Two times a day (BID) | INTRAMUSCULAR | Status: DC
  Administered 2023-11-21 – 2023-11-23 (×6): 40 mg via INTRAVENOUS
  Filled 2023-11-21 (×6): qty 1

## 2023-11-21 MED ORDER — AZITHROMYCIN 250 MG PO TABS
500.0000 mg | ORAL_TABLET | Freq: Once | ORAL | Status: AC
  Administered 2023-11-22: 500 mg via ORAL
  Filled 2023-11-21: qty 2

## 2023-11-21 NOTE — Plan of Care (Signed)

## 2023-11-21 NOTE — Progress Notes (Signed)
 [facility] PROGRESS NOTE  [name] [name] (DOB: December 26, 1960) [id number] PCP: [name], PA-C  Brief Narrative: [name] [name] is a 63 y.o. 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 Had worsening 5/20, transferred to SDU. Pulmonary embolism seen as well as worsening infiltrates on CTA for which heparin  was started and antibiotic coverage was broadened.   Subjective: Nausea improved, feels better today but still on fairly high flow oxygen . Has no chest pain or other complaints.   Objective: BP 137/75   Pulse 74   Temp 98.5 F (36.9 C) (Axillary)   Resp 16   Ht 5\' 4"  (1.626 m)   Wt 63.3 kg   SpO2 91%   BMI 23.95 kg/m   Gen: No distress Pulm: Decreased air exchange but fewer coarse sounds today, no expiratory wheezing  CV: RRR, no MRG or edema GI: Soft, NT, ND, +BS  Neuro: Alert and oriented. No new focal deficits. Ext: Warm, no deformities. Skin: No new rashes, lesions or ulcers on visualized skin   Assessment & Plan: Acute on chronic hypoxic respiratory failure: VBG reassuring.  - Continue supplemental oxygen  to maintain normal WOB and SpO2 >89%. Currently 15LPM HHFNC and weaning FiO2.  - Treat underlying conditions as below.  - BNP elevated, echo showed normal biventricular function.   Acute RUL PE: No evidence of RV strain radiographically or on echo, VSS.  - Continue DOAC (30-day copay is $12.15).  - This is 2nd VTE for pt (first was in 58's, was on coumadin for months), will plan to transition to DOAC and continue indefinitely.  - LE venous U/S without DVT.  Progressive worsening pneumonia:  - Continue zosyn which would help cover in the event of aspiration. MRSA PCR neg. Completing course of azithromycin  po as well. Trend PCT in AM. - Monitor  cultures (no sputum collected) - Mucolytic - Note +rhinovirus, droplet precautions.   AECOPD:  - Continue IV steroids, taper dose slightly - Continue inhaled bronchodilators (scheduled and prn)  Nausea: Improving - Continue prn zofran  and compazine  - Abd benign though limited stool output, continue regimen with senokot and continue encouraging ambulation.  Tobacco use:  - Cessation counseling provided  GERD:  - Continue PPI  Chronic pain:  - Continue buprenorphine  and gabapentin   Hypokalemia: Resolved  Hyponatremia: Monitoring  [name], MD [facility] [email or web address] 11/21/2023, 11:57 AM

## 2023-11-22 DIAGNOSIS — J189 Pneumonia, unspecified organism: Secondary | ICD-10-CM | POA: Diagnosis not present

## 2023-11-22 LAB — CBC WITH DIFFERENTIAL/PLATELET
Abs Immature Granulocytes: 2.86 10*3/uL — ABNORMAL HIGH (ref 0.00–0.07)
Basophils Absolute: 0 10*3/uL (ref 0.0–0.1)
Basophils Relative: 0 %
Eosinophils Absolute: 0 10*3/uL (ref 0.0–0.5)
Eosinophils Relative: 0 %
HCT: 34.1 % — ABNORMAL LOW (ref 36.0–46.0)
Hemoglobin: 10.9 g/dL — ABNORMAL LOW (ref 12.0–15.0)
Immature Granulocytes: 12 %
Lymphocytes Relative: 4 %
Lymphs Abs: 1 10*3/uL (ref 0.7–4.0)
MCH: 29.3 pg (ref 26.0–34.0)
MCHC: 32 g/dL (ref 30.0–36.0)
MCV: 91.7 fL (ref 80.0–100.0)
Monocytes Absolute: 0.7 10*3/uL (ref 0.1–1.0)
Monocytes Relative: 3 %
Neutro Abs: 19 10*3/uL — ABNORMAL HIGH (ref 1.7–7.7)
Neutrophils Relative %: 81 %
Platelets: 384 10*3/uL (ref 150–400)
RBC: 3.72 MIL/uL — ABNORMAL LOW (ref 3.87–5.11)
RDW: 13.9 % (ref 11.5–15.5)
WBC: 23.9 10*3/uL — ABNORMAL HIGH (ref 4.0–10.5)
nRBC: 0 % (ref 0.0–0.2)

## 2023-11-22 LAB — BASIC METABOLIC PANEL WITH GFR
Anion gap: 8 (ref 5–15)
BUN: 19 mg/dL (ref 8–23)
CO2: 29 mmol/L (ref 22–32)
Calcium: 7.8 mg/dL — ABNORMAL LOW (ref 8.9–10.3)
Chloride: 98 mmol/L (ref 98–111)
Creatinine, Ser: 0.59 mg/dL (ref 0.44–1.00)
GFR, Estimated: >60 mL/min (ref >60)
Glucose, Bld: 120 mg/dL — ABNORMAL HIGH (ref 70–99)
Potassium: 4 mmol/L (ref 3.5–5.1)
Sodium: 135 mmol/L (ref 135–145)

## 2023-11-22 LAB — PROCALCITONIN: Procalcitonin: <0.1 ng/mL

## 2023-11-22 MED ORDER — ENSURE ENLIVE PO LIQD
237.0000 mL | Freq: Two times a day (BID) | ORAL | Status: DC
  Administered 2023-11-22: 237 mL via ORAL

## 2023-11-22 MED ORDER — FUROSEMIDE 10 MG/ML IJ SOLN
40.0000 mg | Freq: Once | INTRAMUSCULAR | Status: AC
  Administered 2023-11-22: 40 mg via INTRAVENOUS
  Filled 2023-11-22: qty 4

## 2023-11-22 MED ORDER — PIPERACILLIN-TAZOBACTAM 3.375 G IVPB
3.3750 g | Freq: Three times a day (TID) | INTRAVENOUS | Status: AC
  Administered 2023-11-22 – 2023-11-25 (×11): 3.375 g via INTRAVENOUS
  Filled 2023-11-22 (×11): qty 50

## 2023-11-22 NOTE — Evaluation (Signed)
 Physical Therapy Evaluation Patient Details Name: [name] [name] MRN: [id number] DOB: 06-03-1961 Today's Date: 11/22/2023  History of Present Illness  [name] [name] is a 63 y.o. female with medical history significant for ongoing tobacco abuse, COPD with chronic hypoxic respiratory failure on 3 L of oxygen  via nasal cannula PTA, prior history of pulmonary embolism, CAD with prior angioplasty and stent placement in 2001, GERD with prior history of duodenal ulcers, and prior history of CVA presents to the ED with worsening shortness of breath and worsening hypoxia.  - Over the last 4 to 5 days patient has had episodes of recurrent emesis, she later developed right-sided chest wall and rib cage as well as anterior abdominal wall and flank pain presumably from excessive vomiting and retching.  - Emesis was without bile or blood  - No diarrhea  - Cough becoming more productive   Clinical Impression  Patient functioning near baseline for functional mobility and gait, except limited for ambulation due to fatigue and high requirement for O2. Patient able to take a few steps forward/backwards at bedside with Min/CGA hand held assist while on 34 LPM HFNC and tolerated sitting up in chair with spouse present after therapy. Patient will benefit from continued skilled physical therapy in hospital and recommended venue below to increase strength, balance, endurance for safe ADLs and gait.          If plan is discharge home, recommend the following: A little help with walking and/or transfers;A little help with bathing/dressing/bathroom;Help with stairs or ramp for entrance;Assistance with cooking/housework   Can travel by private vehicle        Equipment Recommendations None recommended by PT  Recommendations for Other Services       Functional Status Assessment Patient has had a recent decline in their functional status and demonstrates the ability to make significant improvements in function in a  reasonable and predictable amount of time.     Precautions / Restrictions Precautions Precautions: Fall Restrictions Weight Bearing Restrictions Per Provider Order: No      Mobility  Bed Mobility Overal bed mobility: Modified Independent                  Transfers Overall transfer level: Needs assistance Equipment used: 1 person hand held assist Transfers: Sit to/from Stand, Bed to chair/wheelchair/BSC Sit to Stand: Contact guard assist   Step pivot transfers: Contact guard assist       General transfer comment: slightly labored movement requiring hand held assist    Ambulation/Gait Ambulation/Gait assistance: Contact guard assist, Min assist Gait Distance (Feet): 15 Feet Assistive device: 1 person hand held assist Gait Pattern/deviations: Decreased step length - right, Decreased step length - left, Decreased stride length Gait velocity: decreased     General Gait Details: slow labored unsteady movement requiring frequent hand held assist taking steps forward/backwards at bedside before having to sit due to fatigue, SOB wtih SpO2 at 88% while on 34 LPM High Flo O2  Stairs            Wheelchair Mobility     Tilt Bed    Modified Rankin (Stroke Patients Only)       Balance Overall balance assessment: Needs assistance Sitting-balance support: Feet supported, No upper extremity supported Sitting balance-Leahy Scale: Good Sitting balance - Comments: seated at EOB   Standing balance support: During functional activity, No upper extremity supported Standing balance-Leahy Scale: Poor Standing balance comment: fair/poor without AD  Pertinent Vitals/Pain Pain Assessment Pain Assessment: No/denies pain    Home Living Family/patient expects to be discharged to:: Private residence Living Arrangements: Spouse/significant other Available Help at Discharge: Family;Available 24 hours/day Type of Home: House Home  Access: Stairs to enter Entrance Stairs-Rails: None Entrance Stairs-Number of Steps: 2   Home Layout: One level Home Equipment: Pharmacist, hospital (2 wheels);Grab bars - tub/shower;BSC/3in1;Cane - single point      Prior Function Prior Level of Function : Needs assist       Physical Assist : Mobility (physical);ADLs (physical) Mobility (physical): Bed mobility;Transfers;Gait;Stairs   Mobility Comments: household and short distanced community ambulation without AD, shops, does not drive, ADLs Comments: Independent household, assisted community ADLs     Extremity/Trunk Assessment   Upper Extremity Assessment Upper Extremity Assessment: Defer to OT evaluation    Lower Extremity Assessment Lower Extremity Assessment: Generalized weakness    Cervical / Trunk Assessment Cervical / Trunk Assessment: Normal  Communication   Communication Communication: No apparent difficulties    Cognition Arousal: Alert Behavior During Therapy: WFL for tasks assessed/performed   PT - Cognitive impairments: No apparent impairments                         Following commands: Intact       Cueing Cueing Techniques: Verbal cues     General Comments      Exercises     Assessment/Plan    PT Assessment Patient needs continued PT services  PT Problem List Decreased strength;Decreased activity tolerance;Decreased balance;Decreased mobility       PT Treatment Interventions DME instruction;Gait training;Stair training;Functional mobility training;Therapeutic activities;Therapeutic exercise;Balance training;Patient/family education    PT Goals (Current goals can be found in the Care Plan section)  Acute Rehab PT Goals Patient Stated Goal: return home with family to assist PT Goal Formulation: With patient/family Time For Goal Achievement: 11/28/23 Potential to Achieve Goals: Good    Frequency Min 3X/week     Co-evaluation               AM-PAC PT "6  Clicks" Mobility  Outcome Measure Help needed turning from your back to your side while in a flat bed without using bedrails?: None Help needed moving from lying on your back to sitting on the side of a flat bed without using bedrails?: None Help needed moving to and from a bed to a chair (including a wheelchair)?: A Little Help needed standing up from a chair using your arms (e.g., wheelchair or bedside chair)?: A Little Help needed to walk in hospital room?: A Little Help needed climbing 3-5 steps with a railing? : A Lot 6 Click Score: 19    End of Session Equipment Utilized During Treatment: Oxygen  Activity Tolerance: Patient tolerated treatment well Patient left: in chair;with call bell/phone within reach;with family/visitor present Nurse Communication: Mobility status PT Visit Diagnosis: Unsteadiness on feet (R26.81);Other abnormalities of gait and mobility (R26.89);Muscle weakness (generalized) (M62.81)    Time: 1610-9604 PT Time Calculation (min) (ACUTE ONLY): 17 min   Charges:   PT Evaluation $PT Eval Low Complexity: 1 Low PT Treatments $Therapeutic Activity: 8-22 mins PT General Charges $$ ACUTE PT VISIT: 1 Visit         1:18 PM, 11/22/23 Walton Guppy, MPT Physical Therapist with [facility] [phone] office [phone] mobile phone

## 2023-11-22 NOTE — Progress Notes (Signed)
 [facility] PROGRESS NOTE  [name] [name] (DOB: [birth year]) [id number] PCP: [name], PA-C  Brief Narrative: [name] [name] is a 63 y.o. 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 Had worsening 5/20, transferred to SDU. Pulmonary embolism seen as well as worsening infiltrates on CTA for which heparin  was started and antibiotic coverage was broadened. Remains in ICU/SDU with HHFNC supplementation, though not progressing to mechanical ventilation at this point.  Subjective: Feels generally worse, unable to declare anything in particular. Her spouse is at bedside, says she looks better overall but a bit more tired today. No chest pain or other new issues. No vomiting. She is happy she got her hair braided.   Objective: BP (!) 146/95 (BP Location: Right Arm)   Pulse 85   Temp 98.2 F (36.8 C) (Oral)   Resp (!) 21   Ht 5\' 4"  (1.626 m)   Wt 63.3 kg   SpO2 92%   BMI 23.95 kg/m   Gen: No distress, appears chronically ill Pulm: Diminished without wheezes or rhonchi, Nonlabored but tachypneic.  CV: RRR, no edema GI: Soft, NT, ND, +BS  Neuro: Alert and oriented. No new focal deficits. Ext: Warm, no deformities. Skin: No rashes, lesions or ulcers on visualized skin   Assessment & Plan: Acute on chronic hypoxic respiratory failure: VBG reassuring.  - Continue supplemental oxygen  to maintain normal WOB and SpO2 >89%.  - Treat underlying conditions as below.  - BNP elevated, echo showed normal biventricular function. Still HHF-dependent. Will trial dose lasix today.   Acute RUL PE: No evidence of RV strain radiographically or on echo, VSS. LE venous U/S without DVT. - Continue DOAC (30-day copay is $12.15). 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  Progressive worsening pneumonia:  - Continue zosyn x7 days, PCT elevation resolved, completed azithromycin .  - Monitor cultures (no sputum collected) - Mucolytic, continue IS, OOB. - Note +rhinovirus, droplet precautions.   AECOPD:  - Continue IV steroids, taper dose slightly - Continue inhaled bronchodilators (scheduled and prn)  Nausea: Improving - Continue prn zofran  and compazine  - Abd benign though limited stool output, continue regimen with senokot and continue encouraging ambulation.  Tobacco use:  - Cessation counseling provided  GERD:  - Continue PPI  Chronic pain:  - Continue buprenorphine  and gabapentin   Hypokalemia: Resolved  Hyponatremia: Monitoring  [name], MD [facility] [email or web address] 11/22/2023, 10:27 AM

## 2023-11-22 NOTE — Plan of Care (Signed)
  Problem: Acute Rehab PT Goals(only PT should resolve) Goal: Pt Will Go Supine/Side To Sit Outcome: Progressing Flowsheets (Taken 11/22/2023 1320) Pt will go Supine/Side to Sit: Independently Goal: Patient Will Transfer Sit To/From Stand Outcome: Progressing Flowsheets (Taken 11/22/2023 1320) Patient will transfer sit to/from stand: with modified independence Goal: Pt Will Transfer Bed To Chair/Chair To Bed Outcome: Progressing Flowsheets (Taken 11/22/2023 1320) Pt will Transfer Bed to Chair/Chair to Bed: with modified independence Goal: Pt Will Ambulate Outcome: Progressing Flowsheets (Taken 11/22/2023 1320) Pt will Ambulate:  50 feet  with supervision  with least restrictive assistive device   1:20 PM, 11/22/23 Walton Guppy, MPT Physical Therapist with [facility] [phone] office [phone] mobile phone

## 2023-11-23 DIAGNOSIS — J189 Pneumonia, unspecified organism: Secondary | ICD-10-CM | POA: Diagnosis not present

## 2023-11-23 LAB — BASIC METABOLIC PANEL WITH GFR
Anion gap: 7 (ref 5–15)
BUN: 17 mg/dL (ref 8–23)
CO2: 31 mmol/L (ref 22–32)
Calcium: 8.1 mg/dL — ABNORMAL LOW (ref 8.9–10.3)
Chloride: 95 mmol/L — ABNORMAL LOW (ref 98–111)
Creatinine, Ser: 0.53 mg/dL (ref 0.44–1.00)
GFR, Estimated: >60 mL/min (ref >60)
Glucose, Bld: 132 mg/dL — ABNORMAL HIGH (ref 70–99)
Potassium: 3.5 mmol/L (ref 3.5–5.1)
Sodium: 133 mmol/L — ABNORMAL LOW (ref 135–145)

## 2023-11-23 MED ORDER — BUPRENORPHINE HCL-NALOXONE HCL 8-2 MG SL SUBL
1.0000 | SUBLINGUAL_TABLET | Freq: Two times a day (BID) | SUBLINGUAL | Status: DC
  Administered 2023-11-23 – 2023-11-24 (×3): 1 via SUBLINGUAL
  Filled 2023-11-23 (×3): qty 1

## 2023-11-23 MED ORDER — FUROSEMIDE 10 MG/ML IJ SOLN
40.0000 mg | Freq: Once | INTRAMUSCULAR | Status: AC
  Administered 2023-11-23: 40 mg via INTRAVENOUS
  Filled 2023-11-23: qty 4

## 2023-11-23 NOTE — Plan of Care (Signed)
  Problem: Education: Goal: Knowledge of General Education information will improve Description: Including pain rating scale, medication(s)/side effects and non-pharmacologic comfort measures Outcome: Progressing   Problem: Clinical Measurements: Goal: Ability to maintain clinical measurements within normal limits will improve Outcome: Progressing Goal: Will remain free from infection Outcome: Progressing   Problem: Activity: Goal: Risk for activity intolerance will decrease Outcome: Progressing   Problem: Nutrition: Goal: Adequate nutrition will be maintained Outcome: Progressing   Problem: Coping: Goal: Level of anxiety will decrease Outcome: Progressing   Problem: Elimination: Goal: Will not experience complications related to bowel motility Outcome: Progressing

## 2023-11-23 NOTE — Plan of Care (Signed)
   Problem: Education: Goal: Knowledge of General Education information will improve Description Including pain rating scale, medication(s)/side effects and non-pharmacologic comfort measures Outcome: Progressing   Problem: Clinical Measurements: Goal: Diagnostic test results will improve Outcome: Progressing Goal: Respiratory complications will improve Outcome: Progressing

## 2023-11-23 NOTE — Progress Notes (Signed)
 [facility] PROGRESS NOTE  [name] [name] (DOB: 28-Jul-1960) ZOX:[id number] PCP: [name], PA-C  Brief Narrative: [name] [name] is a 63 y.o. 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 Had worsening 5/20, transferred to SDU. Pulmonary embolism seen as well as worsening infiltrates on CTA for which heparin  was started and antibiotic coverage was broadened. Remains in ICU/SDU with HHFNC supplementation, though not progressing to mechanical ventilation at this point.  Subjective: Still short of breath, but slowly feeling better despite not coming down on FiO2 yet. She got up to the chair for 7-8 hours yesterday and agrees to do it again. Some nausea again this morning without abdominal pain or vomiting.   Objective: BP 139/86   Pulse 80   Temp 98.6 F (37 C) (Oral)   Resp 20   Ht 5\' 4"  (1.626 m)   Wt 63.3 kg   SpO2 97%   BMI 23.95 kg/m   Gen: No distress Pulm: Tachypneic with coarse bibasilar wounds, no wheezing, remains diminished.  CV: RRR, no MRG, trace edema with compression stockings in place.  GI: Soft, NT, ND, +BS  Neuro: Alert and oriented. No new focal deficits. Ext: Warm, no deformities. Skin: No new rashes, lesions or ulcers on visualized skin   Assessment & Plan: Acute on chronic hypoxic respiratory failure: VBG reassuring.  - Continue supplemental oxygen  to maintain normal WOB and SpO2 >89%. Attempt to wean again today.  - Treat underlying conditions as below.  - BNP elevated, echo showed normal biventricular function, though had good diuretic response to lasix and improved creatinine. Will repeat dose today.   Acute RUL PE: No evidence of RV strain radiographically or on echo, VSS. LE venous U/S without DVT. - Continue DOAC (30-day copay is  $12.15). 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  Progressive worsening pneumonia:  - Continuing zosyn x7 days, PCT elevation resolved, completed azithromycin .  - Monitor cultures (no sputum collected) - Mucolytic, continue IS, OOB. - Note +rhinovirus, droplet precautions.   AECOPD:  - Continue methylprednisolone  40mg  IV q12h, coarse breath sounds, suspect also treating viral pneumonitis, so will keep at this dose.  - Continue inhaled bronchodilators (scheduled and prn)  Nausea: Improving - Continue prn zofran  and compazine  - Abd benign though limited stool output, continue regimen with senokot and continue encouraging ambulation.  Tobacco use:  - Cessation counseling provided  GERD:  - Continue PPI  Chronic pain:  - Continue buprenorphine  and gabapentin   Hypokalemia: Resolved  Hyponatremia: Monitoring  [name], MD [facility] [email or web address] 11/23/2023, 10:45 AM

## 2023-11-24 DIAGNOSIS — J189 Pneumonia, unspecified organism: Secondary | ICD-10-CM | POA: Diagnosis not present

## 2023-11-24 LAB — BASIC METABOLIC PANEL WITH GFR
Anion gap: 7 (ref 5–15)
BUN: 20 mg/dL (ref 8–23)
CO2: 32 mmol/L (ref 22–32)
Calcium: 8.1 mg/dL — ABNORMAL LOW (ref 8.9–10.3)
Chloride: 95 mmol/L — ABNORMAL LOW (ref 98–111)
Creatinine, Ser: 0.56 mg/dL (ref 0.44–1.00)
GFR, Estimated: >60 mL/min (ref >60)
Glucose, Bld: 142 mg/dL — ABNORMAL HIGH (ref 70–99)
Potassium: 3.2 mmol/L — ABNORMAL LOW (ref 3.5–5.1)
Sodium: 134 mmol/L — ABNORMAL LOW (ref 135–145)

## 2023-11-24 LAB — CBC
HCT: 32.4 % — ABNORMAL LOW (ref 36.0–46.0)
Hemoglobin: 10.9 g/dL — ABNORMAL LOW (ref 12.0–15.0)
MCH: 30 pg (ref 26.0–34.0)
MCHC: 33.6 g/dL (ref 30.0–36.0)
MCV: 89.3 fL (ref 80.0–100.0)
Platelets: 377 10*3/uL (ref 150–400)
RBC: 3.63 MIL/uL — ABNORMAL LOW (ref 3.87–5.11)
RDW: 13.8 % (ref 11.5–15.5)
WBC: 23 10*3/uL — ABNORMAL HIGH (ref 4.0–10.5)
nRBC: 0 % (ref 0.0–0.2)

## 2023-11-24 MED ORDER — POTASSIUM CHLORIDE CRYS ER 20 MEQ PO TBCR
40.0000 meq | EXTENDED_RELEASE_TABLET | Freq: Once | ORAL | Status: AC
  Administered 2023-11-24: 40 meq via ORAL
  Filled 2023-11-24: qty 2

## 2023-11-24 MED ORDER — METHYLPREDNISOLONE SODIUM SUCC 40 MG IJ SOLR
40.0000 mg | INTRAMUSCULAR | Status: DC
  Administered 2023-11-24 – 2023-11-25 (×2): 40 mg via INTRAVENOUS
  Filled 2023-11-24 (×2): qty 1

## 2023-11-24 MED ORDER — FUROSEMIDE 10 MG/ML IJ SOLN
40.0000 mg | Freq: Every day | INTRAMUSCULAR | Status: DC
  Administered 2023-11-24 – 2023-11-26 (×3): 40 mg via INTRAVENOUS
  Filled 2023-11-24 (×2): qty 4

## 2023-11-24 MED ORDER — BUPRENORPHINE HCL 8 MG SL SUBL
8.0000 mg | SUBLINGUAL_TABLET | Freq: Two times a day (BID) | SUBLINGUAL | Status: DC
  Administered 2023-11-24 – 2023-11-26 (×4): 8 mg via SUBLINGUAL
  Filled 2023-11-24 (×4): qty 1

## 2023-11-24 NOTE — Progress Notes (Signed)
 Physical Therapy Treatment Patient Details Name: [name] [name] MRN: [id number] DOB: [birth year] Today's Date: 11/24/2023   History of Present Illness [name] [name] is a 63 y.o. female with medical history significant for ongoing tobacco abuse, COPD with chronic hypoxic respiratory failure on 3 L of oxygen  via nasal cannula PTA, prior history of pulmonary embolism, CAD with prior angioplasty and stent placement in 2001, GERD with prior history of duodenal ulcers, and prior history of CVA presents to the ED with worsening shortness of breath and worsening hypoxia.  - Over the last 4 to 5 days patient has had episodes of recurrent emesis, she later developed right-sided chest wall and rib cage as well as anterior abdominal wall and flank pain presumably from excessive vomiting and retching.  - Emesis was without bile or blood  - No diarrhea  - Cough becoming more productive    PT Comments  Pt friendly and willing to participate.  Independent with bed mobility and transfer training.  No AD used during session with no LOB.  Pt limited main by decreased O2 saturation during gait, seated rest breaks required to return O2 saturation to at least 88% or higher prior return to walking.  Lowest O2 saturation at 84%, able to return quickly following cueing for diaphragmatic breathing.  EOS pt left in chair in call bell within reach, RN aware of status.    If plan is discharge home, recommend the following:     Can travel by private vehicle        Equipment Recommendations       Recommendations for Other Services       Precautions / Restrictions Precautions Precautions: Fall Restrictions Weight Bearing Restrictions Per Provider Order: No     Mobility  Bed Mobility Overal bed mobility: Independent                  Transfers Overall transfer level: Modified independent Equipment used: 1 person hand held assist Transfers: Sit to/from Stand, Bed to chair/wheelchair/BSC Sit to Stand:  Contact guard assist           General transfer comment: slightly labored movements with no LOB episodes    Ambulation/Gait Ambulation/Gait assistance: Contact guard assist, Min assist Gait Distance (Feet): 25 Feet Assistive device: 1 person hand held assist Gait Pattern/deviations: Decreased step length - right, Decreased step length - left, Decreased stride length Gait velocity: decreased     General Gait Details: Limited by SpO2 saturation required seated breaks to return to 88% or higher during gait, no LOB episodes   Stairs             Wheelchair Mobility     Tilt Bed    Modified Rankin (Stroke Patients Only)       Balance                                            Communication    Cognition Arousal: Alert Behavior During Therapy: WFL for tasks assessed/performed   PT - Cognitive impairments: No apparent impairments                                Cueing    Exercises General Exercises - Lower Extremity Ankle Circles/Pumps: 20 reps, Seated Long Arc Quad: 20 reps, Seated Hip Flexion/Marching: Both, 10 reps, Seated Toe  Raises: Both, 10 reps, Seated Heel Raises: Both, 10 reps, Seated    General Comments        Pertinent Vitals/Pain Pain Assessment Pain Assessment: No/denies pain    Home Living                          Prior Function            PT Goals (current goals can now be found in the care plan section)      Frequency           PT Plan      Co-evaluation              AM-PAC PT "6 Clicks" Mobility   Outcome Measure  Help needed turning from your back to your side while in a flat bed without using bedrails?: None Help needed moving from lying on your back to sitting on the side of a flat bed without using bedrails?: None Help needed moving to and from a bed to a chair (including a wheelchair)?: A Little Help needed standing up from a chair using your arms (e.g., wheelchair  or bedside chair)?: A Little Help needed to walk in hospital room?: A Little Help needed climbing 3-5 steps with a railing? : A Lot 6 Click Score: 19    End of Session Equipment Utilized During Treatment: Oxygen  Activity Tolerance: Patient tolerated treatment well Patient left: in chair;with call bell/phone within reach Nurse Communication: Mobility status PT Visit Diagnosis: Unsteadiness on feet (R26.81);Other abnormalities of gait and mobility (R26.89);Muscle weakness (generalized) (M62.81)     Time: 1040-1104 PT Time Calculation (min) (ACUTE ONLY): 24 min  Charges:    $Therapeutic Activity: 23-37 mins PT General Charges $$ ACUTE PT VISIT: 1 Visit                    Minor Amble, LPTA/CLT; CBIS [phone]  Alesia Anchors 11/24/2023, 11:11 AM

## 2023-11-24 NOTE — Evaluation (Addendum)
 Occupational Therapy Evaluation Patient Details Name: [name] [name] MRN: [id number] DOB: Mar 24, 1961 Today's Date: 11/24/2023   History of Present Illness   SHERRINA ZAUGG is a 63 y.o. female with medical history significant for ongoing tobacco abuse, COPD with chronic hypoxic respiratory failure on 3 L of oxygen  via nasal cannula PTA, prior history of pulmonary embolism, CAD with prior angioplasty and stent placement in 2001, GERD with prior history of duodenal ulcers, and prior history of CVA presents to the ED with worsening shortness of breath and worsening hypoxia.  - Over the last 4 to 5 days patient has had episodes of recurrent emesis, she later developed right-sided chest wall and rib cage as well as anterior abdominal wall and flank pain presumably from excessive vomiting and retching.  - Emesis was without bile or blood  - No diarrhea  - Cough becoming more productive     Clinical Impressions Pt agreeable to OT evaluation. Pt demonstrates ability to ambulate in the room without physical assist for a few steps and no AD. Able to doff and don socks independently. General weakness in B UE with pt reporting interest in home health OT to build strength in UE. Pt left in the chair with call bell within reach. Pt is not recommended for further acute OT services and will be discharged to care of nursing staff for remaining length of stay.        If plan is discharge home, recommend the following:   A little help with walking and/or transfers;Help with stairs or ramp for entrance;Assist for transportation     Functional Status Assessment   Patient has had a recent decline in their functional status and demonstrates the ability to make significant improvements in function in a reasonable and predictable amount of time.     Equipment Recommendations   None recommended by OT             Precautions/Restrictions   Precautions Precautions: Fall Recall of  Precautions/Restrictions: Intact Restrictions Weight Bearing Restrictions Per Provider Order: No     Mobility Bed Mobility               General bed mobility comments: Pt seated in the chair to start the session.    Transfers Overall transfer level: Independent   Transfers: Sit to/from Stand Sit to Stand: Supervision, Modified independent (Device/Increase time)                  Balance Overall balance assessment: Mild deficits observed, not formally tested                                         ADL either performed or assessed with clinical judgement   ADL Overall ADL's : Modified independent                                       General ADL Comments: Mild labored movement. Able to ambulate ~4 steps forward and backward without physical assist and no AD. Able to doff and don sock independently.     Vision Baseline Vision/History: 0 No visual deficits Ability to See in Adequate Light: 0 Adequate Patient Visual Report: No change from baseline Vision Assessment?: No apparent visual deficits     Perception Perception: Not tested  Praxis Praxis: Not tested       Pertinent Vitals/Pain Pain Assessment Pain Assessment: No/denies pain     Extremity/Trunk Assessment Upper Extremity Assessment Upper Extremity Assessment: Generalized weakness   Lower Extremity Assessment Lower Extremity Assessment: Defer to PT evaluation   Cervical / Trunk Assessment Cervical / Trunk Assessment: Normal   Communication Communication Communication: No apparent difficulties   Cognition Arousal: Alert Behavior During Therapy: WFL for tasks assessed/performed Cognition: No apparent impairments                               Following commands: Intact       Cueing  General Comments   Cueing Techniques: Verbal cues                 Home Living Family/patient expects to be discharged to:: Private  residence Living Arrangements: Spouse/significant other Available Help at Discharge: Family;Available 24 hours/day Type of Home: House Home Access: Stairs to enter [employer] of Steps: 2 Entrance Stairs-Rails: None Home Layout: One level     Bathroom Shower/Tub: Chief Strategy Officer: Standard Bathroom Accessibility: Yes   Home Equipment: Pharmacist, hospital (2 wheels);Grab bars - tub/shower;BSC/3in1;Cane - single point   Additional Comments: per chart      Prior Functioning/Environment Prior Level of Function : Needs assist       Physical Assist : ADLs (physical)   ADLs (physical): IADLs Mobility Comments: household and short distanced community ambulation without AD, shops, does not drive, (per chart) ADLs Comments: Independent household, pt reports they have been more difficult recently. Some assist for IADL's.                 OT Goals(Current goals can be found in the care plan section)   Acute Rehab OT Goals Patient Stated Goal: return home OT Goal Formulation: With patient                                        End of Session Equipment Utilized During Treatment: Oxygen   Activity Tolerance: Patient tolerated treatment well Patient left: in chair;with call bell/phone within reach  OT Visit Diagnosis: Unsteadiness on feet (R26.81);Other abnormalities of gait and mobility (R26.89);Muscle weakness (generalized) (M62.81)                Time: 1610-9604 OT Time Calculation (min): 6 min Charges:  OT General Charges $OT Visit: 1 Visit OT Evaluation $OT Eval Low Complexity: 1 Low  Santonio Speakman OT, MOT  Thurnell Floss 11/24/2023, 11:51 AM

## 2023-11-24 NOTE — Progress Notes (Signed)
 [facility] PROGRESS NOTE  [name] [name] (DOB: Jul 27, 1960) EXB:[id number] PCP: [name], PA-C  Brief Narrative: [name] [name] is a 63 y.o. 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 Had worsening 5/20, transferred to SDU. Pulmonary embolism seen as well as worsening infiltrates on CTA for which heparin  was started and antibiotic coverage was broadened. Remains in ICU/SDU with HHFNC supplementation, starting to improve slowly, to HFNC on 5/26.   Subjective: Feels her nausea is at baseline, a chronic issue, feels breathing is better. Had self-limited epistaxis yesterday that has stayed resolved through the night and this morning.   Objective: BP 107/62   Pulse 75   Temp 98.2 F (36.8 C) (Oral)   Resp 17   Ht 5\' 4"  (1.626 m)   Wt 63.3 kg   SpO2 92%   BMI 23.95 kg/m   Gen: No distress Pulm: Clear, better air exchange throughout, normal rate and effort  CV: RRR, no MRG GI: Soft, NT, ND, +BS  Neuro: Alert and oriented. No new focal deficits. Ext: Warm, no deformities. Skin: No new rashes, lesions or ulcers on visualized skin   Assessment & Plan: Acute on chronic hypoxic respiratory failure: VBG reassuring.  - Continue supplemental oxygen  to maintain normal WOB and SpO2 >89%. Attempt to wean again today. If able to continue weaning, would be eligible for transfer to the floor 5/27.  - Treat underlying conditions as below.  - BNP elevated, echo showed normal biventricular function. Plan to continue lasix 40mg  IV daily, this appears to be improving oxygenation.   Acute RUL PE: No evidence of RV strain radiographically or on echo, VSS. LE venous U/S without DVT. - Continue DOAC (30-day copay is $12.15). 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  Progressive worsening pneumonia:  - Continuing zosyn x7 days, complete tomorrow, PCT elevation resolved, completed azithromycin .  - Monitor cultures (no sputum collected) - Mucolytic, continue IS, OOB. - Note +rhinovirus, droplet precautions.   AECOPD:  - Continue methylprednisolone  40mg  IV, trial taper to daily. Suspect also treating viral pneumonitis, so will keep at this dose.  - Continue inhaled bronchodilators (scheduled and prn)  Nausea: Improving - Continue prn zofran  and compazine  - Abd benign though limited stool output, continue regimen with senokot and continue encouraging ambulation.  Tobacco use:  - Cessation counseling provided  GERD:  - Continue PPI  Chronic pain:  - Continue buprenorphine  and gabapentin   Hypokalemia: Resolved  Hyponatremia: Monitoring  [name], MD [email or web address] 11/24/2023, 9:44 AM

## 2023-11-25 DIAGNOSIS — J189 Pneumonia, unspecified organism: Secondary | ICD-10-CM | POA: Diagnosis not present

## 2023-11-25 LAB — POTASSIUM: Potassium: 3.7 mmol/L (ref 3.5–5.1)

## 2023-11-25 LAB — BASIC METABOLIC PANEL WITH GFR
Anion gap: 6 (ref 5–15)
BUN: 20 mg/dL (ref 8–23)
CO2: 31 mmol/L (ref 22–32)
Calcium: 7.9 mg/dL — ABNORMAL LOW (ref 8.9–10.3)
Chloride: 98 mmol/L (ref 98–111)
Creatinine, Ser: 0.5 mg/dL (ref 0.44–1.00)
GFR, Estimated: >60 mL/min (ref >60)
Glucose, Bld: 97 mg/dL (ref 70–99)
Potassium: 2.6 mmol/L — CL (ref 3.5–5.1)
Sodium: 135 mmol/L (ref 135–145)

## 2023-11-25 LAB — PHOSPHORUS: Phosphorus: 2.7 mg/dL (ref 2.5–4.6)

## 2023-11-25 LAB — MAGNESIUM: Magnesium: 1.8 mg/dL (ref 1.7–2.4)

## 2023-11-25 MED ORDER — PREDNISONE 20 MG PO TABS
40.0000 mg | ORAL_TABLET | Freq: Every day | ORAL | Status: DC
  Administered 2023-11-26: 40 mg via ORAL
  Filled 2023-11-25: qty 2

## 2023-11-25 MED ORDER — POTASSIUM CHLORIDE 10 MEQ/100ML IV SOLN
10.0000 meq | INTRAVENOUS | Status: AC
Start: 2023-11-25 — End: 2023-11-25
  Administered 2023-11-25 (×4): 10 meq via INTRAVENOUS
  Filled 2023-11-25 (×4): qty 100

## 2023-11-25 MED ORDER — POTASSIUM CHLORIDE CRYS ER 20 MEQ PO TBCR
40.0000 meq | EXTENDED_RELEASE_TABLET | Freq: Once | ORAL | Status: AC
  Administered 2023-11-25: 40 meq via ORAL
  Filled 2023-11-25: qty 2

## 2023-11-25 NOTE — Progress Notes (Signed)
 [facility] PROGRESS NOTE  [name] [name] (DOB: 1961/05/23) NFA:[id number] PCP: [name], PA-C  Brief Narrative: [name] [name] is a 63 y.o. 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 Had worsening 5/20, transferred to SDU. Pulmonary embolism seen as well as worsening infiltrates on CTA for which heparin  was started and antibiotic coverage was broadened. Remains in ICU/SDU with HHFNC supplementation, starting to improve slowly, to HFNC on 5/26. Down to 8L O2 on 5/27, transfer to floor.  Subjective: Today is the first day she feels much much better. Breathing nearer baseline (albeit with supplemental oxygen ). Up to chair, eating well, no nausea or vomiting. Grateful for care she's received.   Objective: BP 131/89   Pulse 97   Temp 98.2 F (36.8 C) (Oral)   Resp 19   Ht 5\' 4"  (1.626 m)   Wt 63.3 kg   SpO2 91%   BMI 23.95 kg/m   Gen: Really sweet 62yo F in no distress eating breakfast. Pulm: Better air movement, nonlabored, no wheezes  CV: RRR, no MRG, trace edema GI: Soft, NT, ND, +BS  Neuro: Alert and oriented. No new focal deficits. Ext: Warm, no deformities. Skin: No new rashes, lesions or ulcers on visualized skin   Assessment & Plan: Acute on chronic hypoxic respiratory failure: VBG reassuring.  - Continue supplemental oxygen  to maintain normal WOB and SpO2 >89%. Down to 5L HFNC today, transfer to floor, maximize mobility. - Treat underlying conditions as below.  - BNP elevated, responding well to diuresis, will continue lasix 40mg  IV daily again today  Acute RUL PE: No evidence of RV strain radiographically or on echo, VSS. LE venous U/S without DVT. - Continue DOAC (30-day copay is $12.15). 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  Progressive worsening pneumonia:  - Complete zosyn x7 days, PCT elevation resolved, completed azithromycin  as well.   - Mucolytic, continue IS, OOB. - Note +rhinovirus, droplet precautions.   AECOPD:  - Convert solumedrol to prednisone  starting 5/28.   - Continue inhaled bronchodilators (scheduled and prn)  Nausea: Improving - Continue prn zofran  and compazine  - Abd benign, having stools, continue regimen and encouraging ambulation.  Tobacco use:  - Cessation counseling provided  GERD:  - Continue PPI  Chronic pain:  - Continue buprenorphine  and gabapentin   Hypokalemia: Severe - Repeat supplementation IV and PO - Recheck this PM to guide further Tx.  Hyponatremia: Monitoring  [name], MD [email or web address] 11/25/2023, 11:01 AM

## 2023-11-25 NOTE — Plan of Care (Signed)
  Problem: Education: Goal: Knowledge of General Education information will improve Description: Including pain rating scale, medication(s)/side effects and non-pharmacologic comfort measures 11/25/2023 1906 by Brita Candela, RN Outcome: Progressing 11/25/2023 1906 by Brita Candela, RN Outcome: Progressing   Problem: Clinical Measurements: Goal: Will remain free from infection Outcome: Progressing   Problem: Activity: Goal: Risk for activity intolerance will decrease Outcome: Progressing   Problem: Nutrition: Goal: Adequate nutrition will be maintained Outcome: Progressing   Problem: Coping: Goal: Level of anxiety will decrease Outcome: Progressing   Problem: Elimination: Goal: Will not experience complications related to urinary retention Outcome: Progressing   Problem: Pain Managment: Goal: General experience of comfort will improve and/or be controlled 11/25/2023 1906 by Brita Candela, RN Outcome: Progressing 11/25/2023 1906 by Brita Candela, RN Outcome: Progressing   Problem: Safety: Goal: Ability to remain free from injury will improve 11/25/2023 1906 by Brita Candela, RN Outcome: Progressing 11/25/2023 1906 by Brita Candela, RN Outcome: Progressing   Problem: Skin Integrity: Goal: Risk for impaired skin integrity will decrease Outcome: Progressing

## 2023-11-25 NOTE — Progress Notes (Signed)
   11/25/23 0615  Provider Notification  Provider Name/Title Dr. Amy Kansky  Date Provider Notified 11/25/23  Time Provider Notified [phone]  Method of Notification Page  Notification Reason Critical Result  Test performed and critical result Potassium 2.6  Provider response See new orders

## 2023-11-25 NOTE — Plan of Care (Signed)
  Problem: Education: Goal: Knowledge of General Education information will improve Description: Including pain rating scale, medication(s)/side effects and non-pharmacologic comfort measures Outcome: Progressing   Problem: Clinical Measurements: Goal: Will remain free from infection Outcome: Progressing   Problem: Activity: Goal: Risk for activity intolerance will decrease Outcome: Progressing   Problem: Nutrition: Goal: Adequate nutrition will be maintained Outcome: Progressing   Problem: Coping: Goal: Level of anxiety will decrease Outcome: Progressing   Problem: Pain Managment: Goal: General experience of comfort will improve and/or be controlled Outcome: Progressing   Problem: Safety: Goal: Ability to remain free from injury will improve Outcome: Progressing

## 2023-11-26 DIAGNOSIS — J189 Pneumonia, unspecified organism: Secondary | ICD-10-CM | POA: Diagnosis not present

## 2023-11-26 LAB — BASIC METABOLIC PANEL WITH GFR
Anion gap: 7 (ref 5–15)
BUN: 18 mg/dL (ref 8–23)
CO2: 31 mmol/L (ref 22–32)
Calcium: 8.1 mg/dL — ABNORMAL LOW (ref 8.9–10.3)
Chloride: 103 mmol/L (ref 98–111)
Creatinine, Ser: 0.53 mg/dL (ref 0.44–1.00)
GFR, Estimated: >60 mL/min (ref >60)
Glucose, Bld: 111 mg/dL — ABNORMAL HIGH (ref 70–99)
Potassium: 2.9 mmol/L — ABNORMAL LOW (ref 3.5–5.1)
Sodium: 141 mmol/L (ref 135–145)

## 2023-11-26 LAB — MAGNESIUM: Magnesium: 1.6 mg/dL — ABNORMAL LOW (ref 1.7–2.4)

## 2023-11-26 MED ORDER — APIXABAN 5 MG PO TABS: 5.0000 mg | ORAL_TABLET | Freq: Two times a day (BID) | ORAL | 5 refills | Status: AC

## 2023-11-26 MED ORDER — NICOTINE 14 MG/24HR TD PT24: 14.0000 mg | MEDICATED_PATCH | Freq: Every day | TRANSDERMAL | 0 refills | Status: AC

## 2023-11-26 MED ORDER — BUPROPION HCL ER (SR) 150 MG PO TB12: 150.0000 mg | ORAL_TABLET | Freq: Every morning | ORAL | 5 refills | Status: AC

## 2023-11-26 MED ORDER — POTASSIUM CHLORIDE ER 20 MEQ PO TBCR: 20.0000 meq | EXTENDED_RELEASE_TABLET | Freq: Every day | ORAL | 1 refills | Status: AC

## 2023-11-26 MED ORDER — QUETIAPINE FUMARATE 100 MG PO TABS: 100.0000 mg | ORAL_TABLET | Freq: Every day | ORAL | 4 refills | Status: AC

## 2023-11-26 MED ORDER — PREDNISONE 20 MG PO TABS: 20.0000 mg | ORAL_TABLET | Freq: Every day | ORAL | 0 refills | Status: AC

## 2023-11-26 MED ORDER — BREZTRI AEROSPHERE 160-9-4.8 MCG/ACT IN AERO: 2.0000 | INHALATION_SPRAY | Freq: Two times a day (BID) | RESPIRATORY_TRACT | 3 refills | Status: AC

## 2023-11-26 MED ORDER — GUAIFENESIN ER 600 MG PO TB12: 600.0000 mg | ORAL_TABLET | Freq: Two times a day (BID) | ORAL | 2 refills | Status: AC

## 2023-11-26 MED ORDER — PANTOPRAZOLE SODIUM 40 MG PO TBEC: 40.0000 mg | DELAYED_RELEASE_TABLET | Freq: Every day | ORAL | 2 refills | Status: AC

## 2023-11-26 MED ORDER — MAGNESIUM SULFATE 2 GM/50ML IV SOLN
2.0000 g | Freq: Once | INTRAVENOUS | Status: AC
  Administered 2023-11-26: 2 g via INTRAVENOUS
  Filled 2023-11-26: qty 50

## 2023-11-26 MED ORDER — SENNOSIDES-DOCUSATE SODIUM 8.6-50 MG PO TABS: 2.0000 | ORAL_TABLET | Freq: Every day | ORAL | 3 refills | Status: AC

## 2023-11-26 MED ORDER — ALBUTEROL SULFATE (2.5 MG/3ML) 0.083% IN NEBU: 2.5000 mg | INHALATION_SOLUTION | RESPIRATORY_TRACT | 3 refills | Status: AC | PRN

## 2023-11-26 MED ORDER — ALBUTEROL SULFATE HFA 108 (90 BASE) MCG/ACT IN AERS: 2.0000 | INHALATION_SPRAY | RESPIRATORY_TRACT | 4 refills | Status: AC | PRN

## 2023-11-26 MED ORDER — FUROSEMIDE 40 MG PO TABS: 40.0000 mg | ORAL_TABLET | Freq: Every day | ORAL | 1 refills | Status: AC

## 2023-11-26 MED ORDER — POTASSIUM CHLORIDE CRYS ER 20 MEQ PO TBCR
40.0000 meq | EXTENDED_RELEASE_TABLET | ORAL | Status: AC
  Administered 2023-11-26 (×2): 40 meq via ORAL
  Filled 2023-11-26 (×2): qty 2

## 2023-11-26 MED ORDER — DIVALPROEX SODIUM 250 MG PO DR TAB: 250.0000 mg | DELAYED_RELEASE_TABLET | Freq: Every day | ORAL | 5 refills | Status: AC

## 2023-11-26 MED ORDER — ACETAMINOPHEN 325 MG PO TABS: 650.0000 mg | ORAL_TABLET | Freq: Four times a day (QID) | ORAL | Status: AC | PRN

## 2023-11-26 NOTE — Progress Notes (Signed)
 SATURATION QUALIFICATIONS: (This note is used to comply with regulatory documentation for home oxygen )  Patient Saturations on Room Air at Rest = n/a (pt chronically on 3 lpm [place])  Patient Saturations on Room Air while Ambulating = n/a (pt chronically on 3 lpm [place])  Patient Saturations on 3 liters at Rest = 93%  Patient Saturations on 3 Liters of oxygen  while Ambulating = 88%  Pt with noted Dyspnea with ambulation to bathroom and in room. Recovered SaO2 to 93% with rest.

## 2023-11-26 NOTE — TOC Transition Note (Signed)
 Transition of Care [facility]) - Discharge Note   Patient Details  Name: [name] [name] MRN: [id number] Date of Birth: [birth year]  [facility] CM/SW Contact:  Orelia Binet, RN Phone Number: 11/26/2023, 10:30 AM   Clinical Narrative:   Patient discharging home. Patient has decided she would like HHPT. CMS choices reviewed. Referral sent to [facility] with [facility] for HHPT. He accepted, patient updated. She is active with [employer] for home oxygen  at 3L.    Final next level of care: Home w Home Health Services Barriers to Discharge: Barriers Resolved   Patient Goals and CMS Choice Patient states their goals for this hospitalization and ongoing recovery are:: DC back home CMS Medicare.gov Compare Post Acute Care list provided to:: Patient      Discharge Placement                 Patient and family notified of of transfer: 11/26/23  Discharge Plan and Services Additional resources added to the After Visit Summary for   In-house Referral: Clinical Social Work Discharge Planning Services: CM Consult Post Acute Care Choice: Durable Medical Equipment                 HH Arranged: PT HH Agency: [facility] Date [facility] Agency Contacted: 11/26/23 Time HH Agency Contacted: 1030 Representative spoke with at [facility] Agency: Randel Buss  Social Drivers of Health (SDOH) Interventions SDOH Screenings   Food Insecurity: No Food Insecurity (11/18/2023)  Housing: Low Risk  (11/18/2023)  Transportation Needs: No Transportation Needs (11/18/2023)  Utilities: Not At Risk (11/18/2023)  Social Connections: Unknown (11/13/2021)   Received from [facility]  Tobacco Use: High Risk (11/17/2023)     Readmission Risk Interventions    11/18/2023    8:03 AM  Readmission Risk Prevention Plan  Transportation Screening Complete  HRI or Home Care Consult Complete  Social Work Consult for Recovery Care Planning/Counseling Complete  Palliative Care Screening Not Applicable   Medication Review Oceanographer) Complete

## 2023-11-26 NOTE — Progress Notes (Signed)
 IV removed. Discharge instructions explained. No needs a this time. Pt discharged via wheelchair with husband.

## 2023-11-26 NOTE — Care Management Important Message (Signed)
 Important Message  Patient Details  Name: [name] [name] MRN: [id number] Date of Birth: March 01, 1961   Important Message Given:  Yes - Medicare IM     [name] 11/26/2023, 11:31 AM

## 2023-11-26 NOTE — Discharge Summary (Signed)
 [name] [name], is a 63 y.o. female  DOB 02-18-1961  MRN [id number].  Admission date:  11/17/2023  Admitting Physician  Colin Dawley, MD  Discharge Date:  11/26/2023   Primary MD  [name], PA-C  Recommendations for primary care physician for things to follow:   1)Watch for bleeding while on Blood Thinners--watch for blood in your stool which can make your stool black, maroon, mahogany or red---, blood in your urine which can make your urine pink or red, nosebleeds , also watch for possible bruising -You are taking Apixaban /Eliquis --- which is a blood thinner--- be careful to avoid injury or falls  2)Avoid ibuprofen/Advil/Aleve/Motrin/Goody Powders/Naproxen/BC powders/Meloxicam/Diclofenac /Indomethacin and other Nonsteroidal anti-inflammatory medications as these will make you more likely to bleed and can cause stomach ulcers, can also cause Kidney problems.  3)Follow up with [name], PA-C for recheck in 1 week 4)you need oxygen  at home at 3 L via nasal cannula continuously while awake and while asleep--- smoking or having open fires around oxygen  can cause fire, significant injury and death  Admission Diagnosis  Pneumonia, organism unspecified(486) [J18.9]   Discharge Diagnosis  Pneumonia, organism unspecified(486) [J18.9]    Principal Problem:   Pneumonia, organism unspecified(486) Active Problems:   COPD with acute exacerbation (HCC)   Acute on chronic hypoxic respiratory failure (HCC)   GERD (gastroesophageal reflux disease)   Tobacco use disorder   Community acquired pneumonia   Substance abuse (HCC)   Opioid dependence on agonist therapy (HCC)      Past Medical History:  Diagnosis Date   Acid reflux    Arthritis    CAD (coronary artery disease)    S/P cath with stent in 2001   CHF (congestive heart failure) (HCC)    Collagen vascular disease (HCC)    COPD (chronic  obstructive pulmonary disease) (HCC)    CVA (cerebral vascular accident) (HCC) 1992   DU (duodenal ulcer) 09/19/2009   Hiatal hernia    EGD september 2007. normal except for small hiatal hernia. She underwent small bowel biopsy with history of diarrhea at that time. This was negative   History of colonoscopy    August 2007. She had a pedunculated polyp at 30 cm. which was hamartomatous polyp. She  is due for 5-year followup with family history of coln cancer in a brother at age 69   History of colonoscopy    2005 revealed a linear ulcer with scar versus inflammatory process at the mid right colon, but normal terminal ileum. A biopsy of this area revealed an ulceration, but nonspecific   History of coronary angiogram    CT angiogram in 2007. negative with normal mesenteric arteries   History of kidney stones    Migraine    Pneumonia    pt states she gets pneumonia about twice a year   Pulmonary embolus (HCC) 1999   Ulcerative colitis (HCC)    Urinary frequency     Past Surgical History:  Procedure Laterality Date   ABDOMINAL HYSTERECTOMY  1985   ANTERIOR CERVICAL DECOMP/DISCECTOMY  FUSION N/A 08/15/2021   Procedure: Cervical Three-Four, Cervical Four-Five, Cervical Five-Six  Anterior cervical decompression/discectomy/fusion/interbody prosthesis/plate/screws;  Surgeon: Garry Kansas, MD;  Location: [facility] OR;  Service: Neurosurgery;  Laterality: N/A;  Cervical Three-Four, Cervical Four-Five, Cervical Five-Six  Anterior cervical decompression/discectomy/fusion/interbody prosthesis/plate/screws   APPENDECTOMY     CESAREAN SECTION  1979   CHOLECYSTECTOMY  2021   COLONOSCOPY  01/2006   Rourk-pedunculated polyp 30 cm hamartomatous polyp,   COLONOSCOPY  2005   Lineal ulcer or mid right colon, normal TI nonspecific biopsy   CORONARY ANGIOPLASTY WITH STENT PLACEMENT  2001   [facility], [place]   ESOPHAGOGASTRODUODENOSCOPY  09/19/2009   Rourk -2 duodenal bulbar ulcers, small HH otherwise  normal/tiny distal esophageal erosions with mild erosive reflux   ESOPHAGOGASTRODUODENOSCOPY  03/2006   Rourk-Hiatal hernia, negative small bowel biopsy   LEFT HEART CATH AND CORONARY ANGIOGRAPHY N/A 04/18/2021   Procedure: LEFT HEART CATH AND CORONARY ANGIOGRAPHY;  Surgeon: Kyra Phy, MD;  Location: [facility];  Service: Cardiovascular;  Laterality: N/A;   SPINE SURGERY     TONSILLECTOMY  1986   TUBAL LIGATION     Unilateral oophorectomy with hysterectomy       HPI  from the history and physical done on the day of admission:   HPI: [name] [name] is a 63 y.o. female with medical history significant for ongoing tobacco abuse, COPD with chronic hypoxic respiratory failure on 3 L of oxygen  via nasal cannula PTA, prior history of pulmonary embolism, CAD with prior angioplasty and stent placement in 2001, GERD with prior history of duodenal ulcers, and prior history of CVA presents to the ED with worsening shortness of breath and worsening hypoxia. - Over the last 4 to 5 days patient has had episodes of recurrent emesis, she later developed right-sided chest wall and rib cage as well as anterior abdominal wall and flank pain presumably from excessive vomiting and retching. - Emesis was without bile or blood - No diarrhea - Cough becoming more productive   In ED chest x-ray shows--IMPRESSION: *Heterogeneous opacities at the bilateral lung bases, slightly more pronounced than the prior exam. Findings may represent  possible pneumonitis. Troponin 5 >>4, EKG shows sinus tachycardia without acute changes - SOdium 134 potassium 3.4 bicarb 28 creatinine 0.84 LFTs are not elevated - WBC 17.3, Hgb 10.9 and platelets 311 - Flu COVID and RSV were negative on 11/14/2023   Review of Systems: As mentioned in the history of present illness. All other systems reviewed and are negative.     Hospital Course:   1)Acute on chronic hypoxic respiratory failure: Due to combination of pneumonia  and PE--- - Much improved overall - Currently back to 2 L of oxygen  via nasal cannula from 5 L -Post ambulation O2 sats on 3 L of oxygen  acceptable--- please see nursing documentation   2)Acute RUL PE: No evidence of RV strain radiographically or on echo,  -- LE venous U/S without DVT. - Continue DOAC (30-day copay is $12.15). 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   - Note +rhinovirus, droplet precautions.  -Much improved clinically after interventions as above   4) acute COPD exacerbation--due to #3 above -treated with Solu-Medrol  -Overall much improved -Discharge on p.o. prednisone  mucolytics and bronchodilators   5)Tobacco use:  Smoking cessation counseling for 4  minutes today,  Give Nicotine  patch I have discussed tobacco cessation with the patient.  I have counseled the patient regarding the negative impacts of continued tobacco use including but not limited to lung cancer, COPD, and cardiovascular disease.  I have discussed alternatives to tobacco and modalities that may help facilitate tobacco cessation including but not limited to biofeedback, hypnosis, and medications.  Total time spent with tobacco counseling was 4 minutes.  6)GERD--- continue Protonix   7)Chronic pain:  - Continue buprenorphine  and gabapentin    8)Steroid-induced leukocytosis--patient completed antibiotics as above #3, no new infections identified  Discharge Condition: stable  Follow UP   Follow-up Information     [facility].   Specialty: Home Health Services Why: PT will call to schedule your first home visit. Contact information: [address] [place] [address] [phone]         [name], PA-C. Schedule an appointment as soon as possible for a visit in 1  week(s).   Specialty: Family Medicine Why: recheck Contact information: [address] [place] [address] [phone]                 Diet and Activity recommendation:  As advised  Discharge Instructions    Discharge Instructions     Call MD for:  difficulty breathing, headache or visual disturbances   Complete by: As directed    Call MD for:  persistant dizziness or light-headedness   Complete by: As directed    Call MD for:  persistant nausea and vomiting   Complete by: As directed    Call MD for:  temperature >100.4   Complete by: As directed    Diet - low sodium heart healthy   Complete by: As directed    Discharge instructions   Complete by: As directed    1)Watch for bleeding while on Blood Thinners--watch for blood in your stool which can make your stool black, maroon, mahogany or red---, blood in your urine which can make your urine pink or red, nosebleeds , also watch for possible bruising -You are taking Apixaban /Eliquis --- which is a blood thinner--- be careful to avoid injury or falls  2)Avoid ibuprofen/Advil/Aleve/Motrin/Goody Powders/Naproxen/BC powders/Meloxicam/Diclofenac /Indomethacin and other Nonsteroidal anti-inflammatory medications as these will make you more likely to bleed and can cause stomach ulcers, can also cause Kidney problems.  3)Follow up with [name], PA-C for recheck in 1 week 4)you need oxygen  at home at 3 L via nasal cannula continuously while awake and while asleep--- smoking or having open fires around oxygen  can cause fire, significant injury and death   Increase activity slowly   Complete by: As directed          Discharge Medications     Allergies as of 11/26/2023       Reactions   Codeine     Hydrocodone Nausea And Vomiting   Sulfa Antibiotics Hives, Itching   Hydrocodone-acetaminophen  Nausea And Vomiting, Anxiety        Medication List     STOP taking these medications    aspirin  EC 81 MG tablet    famotidine  20 MG tablet Commonly known as: Pepcid    ondansetron  8 MG disintegrating tablet Commonly known as: ZOFRAN -ODT       TAKE these medications    acetaminophen  325 MG tablet Commonly known as: TYLENOL  Take 2 tablets (650 mg total) by mouth every 6 (six) hours as needed for mild pain (pain score 1-3) (or Fever >/= 101).  albuterol  108 (90 Base) MCG/ACT inhaler Commonly known as: VENTOLIN  HFA Inhale 2 puffs into the lungs every 4 (four) hours as needed for shortness of breath or wheezing. What changed: when to take this   albuterol  (2.5 MG/3ML) 0.083% nebulizer solution Commonly known as: PROVENTIL  Take 3 mLs (2.5 mg total) by nebulization every 4 (four) hours as needed for wheezing or shortness of breath. What changed: Another medication with the same name was changed. Make sure you understand how and when to take each.   apixaban  5 MG Tabs tablet Commonly known as: ELIQUIS  Take 1 tablet (5 mg total) by mouth 2 (two) times daily.   Breztri  Aerosphere 160-9-4.8 MCG/ACT Aero inhaler Generic drug: budesonide -glycopyrrolate-formoterol  Inhale 2 puffs into the lungs in the morning and at bedtime.   buprenorphine  8 MG Subl SL tablet Commonly known as: SUBUTEX  Place 8 mg under the tongue in the morning and at bedtime.   buPROPion  150 MG 12 hr tablet Commonly known as: WELLBUTRIN  SR Take 1 tablet (150 mg total) by mouth every morning.   divalproex  250 MG DR tablet Commonly known as: DEPAKOTE  Take 1 tablet (250 mg total) by mouth daily.   furosemide  40 MG tablet Commonly known as: Lasix  Take 1 tablet (40 mg total) by mouth daily.   gabapentin  300 MG capsule Commonly known as: NEURONTIN  Take 300 mg by mouth 3 (three) times daily.   guaiFENesin  600 MG 12 hr tablet Commonly known as: Mucinex  Take 1 tablet (600 mg total) by mouth 2 (two) times daily.   nicotine  14 mg/24hr patch Commonly known as: NICODERM CQ  - dosed in mg/24 hours Place 1 patch (14 mg total) onto  the skin daily. Start taking on: Nov 27, 2023   ondansetron  4 MG tablet Commonly known as: ZOFRAN  Take 1 tablet (4 mg total) by mouth every 4 (four) hours as needed for nausea or vomiting.   OXYGEN  Inhale 4 L into the lungs continuous as needed (shortness of breath).   pantoprazole  40 MG tablet Commonly known as: Protonix  Take 1 tablet (40 mg total) by mouth daily. What changed: additional instructions   Potassium Chloride  ER 20 MEQ Tbcr Take 1 tablet (20 mEq total) by mouth daily. 1 tab daily by mouth--- take while taking Lasix /furosemide    predniSONE  20 MG tablet Commonly known as: DELTASONE  Take 1 tablet (20 mg total) by mouth daily with breakfast for 5 days.   promethazine  25 MG tablet Commonly known as: PHENERGAN  Take 25 mg by mouth every 6 (six) hours as needed.   QUEtiapine  100 MG tablet Commonly known as: SEROQUEL  Take 1 tablet (100 mg total) by mouth at bedtime.   senna-docusate 8.6-50 MG tablet Commonly known as: Senokot-S Take 2 tablets by mouth daily. Start taking on: Nov 27, 2023        Major procedures and Radiology Reports - PLEASE review detailed and final reports for all details, in brief -   US  Venous Img Lower Bilateral (DVT) Result Date: 11/20/2023 CLINICAL DATA:  Right upper lobe pulmonary embolism. EXAM: BILATERAL LOWER EXTREMITY VENOUS DOPPLER ULTRASOUND TECHNIQUE: Gray-scale sonography with graded compression, as well as color Doppler and duplex ultrasound were performed to evaluate the lower extremity deep venous systems from the level of the common femoral vein and including the common femoral, femoral, profunda femoral, popliteal and calf veins including the posterior tibial, peroneal and gastrocnemius veins when visible. The superficial great saphenous vein was also interrogated. Spectral Doppler was utilized to evaluate flow at rest and with distal augmentation  maneuvers in the common femoral, femoral and popliteal veins. COMPARISON:  None  Available. FINDINGS: RIGHT LOWER EXTREMITY Common Femoral Vein: No evidence of thrombus. Normal compressibility, respiratory phasicity and response to augmentation. Saphenofemoral Junction: No evidence of thrombus. Normal compressibility and flow on color Doppler imaging. Profunda Femoral Vein: No evidence of thrombus. Normal compressibility and flow on color Doppler imaging. Femoral Vein: No evidence of thrombus. Normal compressibility, respiratory phasicity and response to augmentation. Popliteal Vein: No evidence of thrombus. Normal compressibility, respiratory phasicity and response to augmentation. Calf Veins: No evidence of thrombus. Normal compressibility and flow on color Doppler imaging. Superficial Great Saphenous Vein: No evidence of thrombus. Normal compressibility. Venous Reflux:  None. Other Findings: No evidence of superficial thrombophlebitis or abnormal fluid collection. LEFT LOWER EXTREMITY Common Femoral Vein: No evidence of thrombus. Normal compressibility, respiratory phasicity and response to augmentation. Saphenofemoral Junction: No evidence of thrombus. Normal compressibility and flow on color Doppler imaging. Profunda Femoral Vein: No evidence of thrombus. Normal compressibility and flow on color Doppler imaging. Femoral Vein: No evidence of thrombus. Normal compressibility, respiratory phasicity and response to augmentation. Popliteal Vein: No evidence of thrombus. Normal compressibility, respiratory phasicity and response to augmentation. Calf Veins: No evidence of thrombus. Normal compressibility and flow on color Doppler imaging. Superficial Great Saphenous Vein: No evidence of thrombus. Normal compressibility. Venous Reflux:  None. Other Findings: No evidence of superficial thrombophlebitis or abnormal fluid collection. IMPRESSION: No evidence of deep venous thrombosis in either lower extremity. Electronically Signed   By: Erica Hau M.D.   On: 11/20/2023 14:40   ECHOCARDIOGRAM  COMPLETE Result Date: 11/19/2023    ECHOCARDIOGRAM REPORT   Patient Name:   [name] [name] Date of Exam: 11/19/2023 Medical Rec #:  [id number]       Height:       64.0 in Accession #:    [id number]      Weight:       139.6 lb Date of Birth:  1961-01-04      BSA:          1.679 m Patient Age:    62 years        BP:           141/82 mmHg Patient Gender: F               HR:           71 bpm. Exam Location:  [facility] Procedure: 2D Echo, 3D Echo, Cardiac Doppler, Color Doppler and Strain Analysis            (Both Spectral and Color Flow Doppler were utilized during            procedure). Indications:    Pulmonary embolus  History:        Patient has prior history of Echocardiogram examinations, most                 recent 11/07/2022. CAD, COPD and Stroke, Signs/Symptoms:Syncope,                 Chest Pain and Shortness of Breath; Risk Factors:Current Smoker,                 Dyslipidemia and Hypertension.  Sonographer:    Juanita Shaw Referring Phys: [phone] DAVID TAT IMPRESSIONS  1. Left ventricular ejection fraction, by estimation, is 60 to 65%. The left ventricle has normal function. The left ventricle has no regional wall motion abnormalities. Left ventricular diastolic parameters were normal. The average  left ventricular global longitudinal strain is -20.4 %. The global longitudinal strain is normal.  2. Right ventricular systolic function is normal. The right ventricular size is normal. There is normal pulmonary artery systolic pressure.  3. The mitral valve is abnormal. Mild mitral valve regurgitation. No evidence of mitral stenosis.  4. The aortic valve is tricuspid. Aortic valve regurgitation is not visualized. No aortic stenosis is present.  5. The inferior vena cava is normal in size with greater than 50% respiratory variability, suggesting right atrial pressure of 3 mmHg. FINDINGS  Left Ventricle: Left ventricular ejection fraction, by estimation, is 60 to 65%. The left ventricle has normal function. The left  ventricle has no regional wall motion abnormalities. The average left ventricular global longitudinal strain is -20.4 %. Strain was performed and the global longitudinal strain is normal. The left ventricular internal cavity size was normal in size. There is no left ventricular hypertrophy. Left ventricular diastolic parameters were normal. Right Ventricle: The right ventricular size is normal. Right vetricular wall thickness was not well visualized. Right ventricular systolic function is normal. There is normal pulmonary artery systolic pressure. The tricuspid regurgitant velocity is 2.52 m/s, and with an assumed right atrial pressure of 3 mmHg, the estimated right ventricular systolic pressure is 28.4 mmHg. Left Atrium: Left atrial size was normal in size. Right Atrium: Right atrial size was normal in size. Pericardium: There is no evidence of pericardial effusion. Mitral Valve: The mitral valve is abnormal. Mild mitral valve regurgitation. No evidence of mitral valve stenosis. MV peak gradient, 6.0 mmHg. The mean mitral valve gradient is 2.0 mmHg. Tricuspid Valve: The tricuspid valve is normal in structure. Tricuspid valve regurgitation is not demonstrated. No evidence of tricuspid stenosis. Aortic Valve: The aortic valve is tricuspid. Aortic valve regurgitation is not visualized. No aortic stenosis is present. Aortic valve mean gradient measures 3.0 mmHg. Aortic valve peak gradient measures 6.2 mmHg. Aortic valve area, by VTI measures 2.30 cm. Pulmonic Valve: The pulmonic valve was not well visualized. Pulmonic valve regurgitation is not visualized. No evidence of pulmonic stenosis. Aorta: The aortic root and ascending aorta are structurally normal, with no evidence of dilitation. Venous: The inferior vena cava is normal in size with greater than 50% respiratory variability, suggesting right atrial pressure of 3 mmHg. IAS/Shunts: No atrial level shunt detected by color flow Doppler.  LEFT VENTRICLE PLAX 2D  LVIDd:         4.50 cm      Diastology LVIDs:         2.90 cm      LV e' medial:    7.94 cm/s LV PW:         0.70 cm      LV E/e' medial:  14.9 LV IVS:        0.70 cm      LV e' lateral:   14.30 cm/s LVOT diam:     1.80 cm      LV E/e' lateral: 8.3 LV SV:         60 LV SV Index:   36           2D Longitudinal Strain LVOT Area:     2.54 cm     2D Strain GLS Avg:     -20.4 %  LV Volumes (MOD) LV vol d, MOD A2C: 102.0 ml LV vol d, MOD A4C: 106.0 ml LV vol s, MOD A2C: 29.5 ml LV vol s, MOD A4C: 32.4 ml LV SV MOD A2C:  72.5 ml LV SV MOD A4C:     106.0 ml LV SV MOD BP:      72.4 ml RIGHT VENTRICLE             IVC RV Basal diam:  3.50 cm     IVC diam: 2.20 cm RV Mid diam:    2.00 cm RV S prime:     13.30 cm/s TAPSE (M-mode): 2.9 cm LEFT ATRIUM             Index        RIGHT ATRIUM          Index LA diam:        3.60 cm 2.14 cm/m   RA Area:     9.91 cm LA Vol (A2C):   49.5 ml 29.48 ml/m  RA Volume:   18.20 ml 10.84 ml/m LA Vol (A4C):   42.0 ml 25.02 ml/m LA Biplane Vol: 47.9 ml 28.53 ml/m  AORTIC VALVE                    PULMONIC VALVE AV Area (Vmax):    2.32 cm     PV Vmax:       0.83 m/s AV Area (Vmean):   2.14 cm     PV Peak grad:  2.7 mmHg AV Area (VTI):     2.30 cm AV Vmax:           125.00 cm/s AV Vmean:          83.400 cm/s AV VTI:            0.261 m AV Peak Grad:      6.2 mmHg AV Mean Grad:      3.0 mmHg LVOT Vmax:         114.00 cm/s LVOT Vmean:        70.100 cm/s LVOT VTI:          0.236 m LVOT/AV VTI ratio: 0.90  AORTA Ao Root diam: 2.90 cm Ao Asc diam:  3.00 cm MITRAL VALVE                TRICUSPID VALVE MV Area (PHT): 5.88 cm     TR Peak grad:   25.4 mmHg MV Area VTI:   1.95 cm     TR Vmax:        252.00 cm/s MV Peak grad:  6.0 mmHg MV Mean grad:  2.0 mmHg     SHUNTS MV Vmax:       1.22 m/s     Systemic VTI:  0.24 m MV Vmean:      73.4 cm/s    Systemic Diam: 1.80 cm MV Decel Time: 129 msec MR Peak grad: 143.3 mmHg MR Mean grad: 103.5 mmHg MR Vmax:      598.50 cm/s MR Vmean:     484.0 cm/s MV E  velocity: 118.00 cm/s MV A velocity: 85.20 cm/s MV E/A ratio:  1.38 Armida Lander MD Electronically signed by Armida Lander MD Signature Date/Time: 11/19/2023/3:11:08 PM    Final    CT Angio Chest Pulmonary Embolism (PE) W or WO Contrast Result Date: 11/18/2023 CLINICAL DATA:  Pulmonary embolism suspected, high probability. EXAM: CT ANGIOGRAPHY CHEST WITH CONTRAST TECHNIQUE: Multidetector CT imaging of the chest was performed using the standard protocol during bolus administration of intravenous contrast. Multiplanar CT image reconstructions and MIPs were obtained to evaluate the vascular anatomy. RADIATION DOSE REDUCTION: This exam was performed according to the departmental dose-optimization program  which includes automated exposure control, adjustment of the mA and/or kV according to patient size and/or use of iterative reconstruction technique. CONTRAST:  75mL OMNIPAQUE  IOHEXOL  350 MG/ML SOLN COMPARISON:  Chest CTA 09/23/2022 and chest radiograph 11/17/2023 FINDINGS: Cardiovascular: Positive for a small amount of pulmonary embolism in the right upper lobe segmental branches. No other pulmonary emboli. Heart size is within normal limits. Coronary artery calcifications. No significant pericardial effusion. Normal caliber of the thoracic aorta. Mediastinum/Nodes: Mild soft tissue fullness in the right infrahilar region on image 59/4. Mildly prominent lymph nodes in the AP window region. No axillary lymph node enlargement. Lungs/Pleura: Small right pleural effusion. Large amount of consolidation in the posterior lower right lower lobe. Consolidation involving the central and posterior aspect of the left lower lobe. Underlying centrilobular emphysema. Patchy areas of airspace disease in the right middle lobe, right upper lobe and left upper lobe. Airspace disease in the right lower lobe superior segment. Stable 4 mm calcified nodule in the left upper lobe on image 32/6. Stable scarring at the lung apices. Upper  Abdomen: Intrahepatic biliary dilatation is similar to the previous examination. No acute abnormality in the visualized upper abdomen. Musculoskeletal: Anterior plate and screw fixation in the cervical spine. No acute bone abnormality. Review of the MIP images confirms the above findings. IMPRESSION: 1. Positive for a small amount of pulmonary embolism in the right upper lobe. 2. Extensive bilateral airspace disease. This represents a significant change from the chest radiograph on 11/17/2023. Findings are suggestive for multifocal pneumonia and/or aspiration. 3. Small right pleural effusion. 4. Emphysema (ICD10-J43.9). Critical Value/emergent results were called by telephone at the time of interpretation on 11/18/2023 at 4:50 pm to provider Dr. Myrtie Atkinson Tat, who verbally acknowledged these results. Electronically Signed   By: Elene Griffes M.D.   On: 11/18/2023 16:52   DG Chest Port 1 View Result Date: 11/17/2023 CLINICAL DATA:  Shortness of breath. EXAM: PORTABLE CHEST 1 VIEW COMPARISON:  11/14/2023. FINDINGS: There are heterogeneous opacities at the bilateral lung bases, slightly more pronounced than the prior exam. Findings may represent combination of atelectasis and or pneumonitis. Correlate clinically. Redemonstration of blunting of right lateral costophrenic angle, which may represent trace right pleural effusion. No significant interval change. No significant left pleural effusion. No pneumothorax on either side. Stable cardio-mediastinal silhouette. No acute osseous abnormalities. Partially seen lower cervical spinal fixation hardware. The soft tissues are within normal limits. IMPRESSION: *Heterogeneous opacities at the bilateral lung bases, slightly more pronounced than the prior exam. Findings may represent combination of atelectasis and or pneumonitis. Electronically Signed   By: Beula Brunswick M.D.   On: 11/17/2023 08:33   DG Chest Portable 1 View Result Date: 11/14/2023 CLINICAL DATA:  Cough. EXAM:  PORTABLE CHEST 1 VIEW COMPARISON:  Chest radiograph dated 09/23/2022. FINDINGS: Bibasilar atelectasis. No focal consolidation, pleural effusion or pneumothorax. Cardiac silhouette is within normal limits. Atherosclerotic calcification of the aorta. No acute osseous pathology. IMPRESSION: Bibasilar atelectasis. No focal consolidation. Electronically Signed   By: Angus Bark M.D.   On: 11/14/2023 11:58    Micro Results   Recent Results (from the past 240 hours)  Respiratory (~20 pathogens) panel by PCR     Status: Abnormal   Collection Time: 11/18/23  3:00 PM   Specimen: Nasopharyngeal Swab; Respiratory  Result Value Ref Range Status   Adenovirus NOT DETECTED NOT DETECTED Final   Coronavirus 229E NOT DETECTED NOT DETECTED Final    Comment: (NOTE) The Coronavirus on the Respiratory Panel, DOES NOT  test for the novel  Coronavirus (2019 nCoV)    Coronavirus HKU1 NOT DETECTED NOT DETECTED Final   Coronavirus NL63 NOT DETECTED NOT DETECTED Final   Coronavirus OC43 NOT DETECTED NOT DETECTED Final   Metapneumovirus NOT DETECTED NOT DETECTED Final   Rhinovirus / Enterovirus DETECTED (A) NOT DETECTED Final   Influenza A NOT DETECTED NOT DETECTED Final   Influenza B NOT DETECTED NOT DETECTED Final   Parainfluenza Virus 1 NOT DETECTED NOT DETECTED Final   Parainfluenza Virus 2 NOT DETECTED NOT DETECTED Final   Parainfluenza Virus 3 NOT DETECTED NOT DETECTED Final   Parainfluenza Virus 4 NOT DETECTED NOT DETECTED Final   Respiratory Syncytial Virus NOT DETECTED NOT DETECTED Final   Bordetella pertussis NOT DETECTED NOT DETECTED Final   Bordetella Parapertussis NOT DETECTED NOT DETECTED Final   Chlamydophila pneumoniae NOT DETECTED NOT DETECTED Final   Mycoplasma pneumoniae NOT DETECTED NOT DETECTED Final    Comment: Performed at [facility], [address] [place], [place] [address]  MRSA Next Gen by PCR, Nasal     Status: None   Collection Time: 11/18/23  4:29 PM   Specimen: Nasal  Mucosa; Nasal Swab  Result Value Ref Range Status   MRSA by PCR Next Gen NOT DETECTED NOT DETECTED Final    Comment: (NOTE) The GeneXpert MRSA Assay (FDA approved for NASAL specimens only), is one component of a comprehensive MRSA colonization surveillance program. It is not intended to diagnose MRSA infection nor to guide or monitor treatment for MRSA infections. Test performance is not FDA approved in patients less than 69 years old. Performed at [facility], [address] [place], [place] [address]     Today   Subjective    [name] [name] today has no new complaints  No fever  Or chills      - Husband at bedside, questions answered - Doing well on 2 liters of oxygen  at rest and 3 L of oxygen  via nasal cannula with activity   Patient has been seen and examined prior to discharge   Objective   Blood pressure 107/81, pulse 86, temperature 98 F (36.7 C), temperature source Oral, resp. rate 20, height 5' 4 (1.626 m), weight 63.3 kg, SpO2 94%.   Intake/Output Summary (Last 24 hours) at 11/26/2023 1434 Last data filed at 11/26/2023 0905 Gross per 24 hour  Intake 913.8 ml  Output --  Net 913.8 ml   Exam Gen:- Awake Alert, no acute distress, no conversational dyspnea HEENT:- [place].AT, No sclera icterus Nose- [place] 3L/min Neck-Supple Neck,No JVD,.  Lungs-improved air movement, no wheezing  CV- S1, S2 normal, regular Abd-  +ve B.Sounds, Abd Soft, No tenderness,    Extremity/Skin:- No  edema,   good pulses Psych-affect is appropriate, oriented x3 Neuro-no new focal deficits, no tremors    Data Review   CBC w Diff:  Lab Results  Component Value Date   WBC 23.0 (H) 11/24/2023   HGB 10.9 (L) 11/24/2023   HCT 32.4 (L) 11/24/2023   PLT 377 11/24/2023   LYMPHOPCT 4 11/22/2023   MONOPCT 3 11/22/2023   EOSPCT 0 11/22/2023   BASOPCT 0 11/22/2023    CMP:  Lab Results  Component Value Date   NA 141 11/26/2023   K 2.9 (L) 11/26/2023   CL 103 11/26/2023   CO2 31 11/26/2023    BUN 18 11/26/2023   CREATININE 0.53 11/26/2023   PROT 6.3 (L) 11/17/2023   ALBUMIN 2.7 (L) 11/17/2023   BILITOT 0.4  11/17/2023   ALKPHOS 73 11/17/2023   AST 12 (L) 11/17/2023   ALT 11 11/17/2023  .  Total Discharge time is about 33 minutes  Colin Dawley M.D on 11/26/2023 at 2:34 PM  Go to [email or web address] -  for contact info  [facility] - Office  [phone]

## 2023-12-01 DIAGNOSIS — M359 Systemic involvement of connective tissue, unspecified: Secondary | ICD-10-CM | POA: Diagnosis not present

## 2023-12-01 DIAGNOSIS — Z9981 Dependence on supplemental oxygen: Secondary | ICD-10-CM | POA: Diagnosis not present

## 2023-12-01 DIAGNOSIS — Z7951 Long term (current) use of inhaled steroids: Secondary | ICD-10-CM | POA: Diagnosis not present

## 2023-12-01 DIAGNOSIS — Z7901 Long term (current) use of anticoagulants: Secondary | ICD-10-CM | POA: Diagnosis not present

## 2023-12-01 DIAGNOSIS — G43909 Migraine, unspecified, not intractable, without status migrainosus: Secondary | ICD-10-CM | POA: Diagnosis not present

## 2023-12-01 DIAGNOSIS — M519 Unspecified thoracic, thoracolumbar and lumbosacral intervertebral disc disorder: Secondary | ICD-10-CM | POA: Diagnosis not present

## 2023-12-01 DIAGNOSIS — G8918 Other acute postprocedural pain: Secondary | ICD-10-CM | POA: Diagnosis not present

## 2023-12-01 DIAGNOSIS — I2510 Atherosclerotic heart disease of native coronary artery without angina pectoris: Secondary | ICD-10-CM | POA: Diagnosis not present

## 2023-12-01 DIAGNOSIS — K219 Gastro-esophageal reflux disease without esophagitis: Secondary | ICD-10-CM | POA: Diagnosis not present

## 2023-12-01 DIAGNOSIS — K449 Diaphragmatic hernia without obstruction or gangrene: Secondary | ICD-10-CM | POA: Diagnosis not present

## 2023-12-01 DIAGNOSIS — E876 Hypokalemia: Secondary | ICD-10-CM | POA: Diagnosis not present

## 2023-12-01 DIAGNOSIS — I509 Heart failure, unspecified: Secondary | ICD-10-CM | POA: Diagnosis not present

## 2023-12-01 DIAGNOSIS — M549 Dorsalgia, unspecified: Secondary | ICD-10-CM | POA: Diagnosis not present

## 2023-12-01 DIAGNOSIS — D649 Anemia, unspecified: Secondary | ICD-10-CM | POA: Diagnosis not present

## 2023-12-01 DIAGNOSIS — F17210 Nicotine dependence, cigarettes, uncomplicated: Secondary | ICD-10-CM | POA: Diagnosis not present

## 2023-12-01 DIAGNOSIS — J441 Chronic obstructive pulmonary disease with (acute) exacerbation: Secondary | ICD-10-CM | POA: Diagnosis not present

## 2023-12-01 DIAGNOSIS — E871 Hypo-osmolality and hyponatremia: Secondary | ICD-10-CM | POA: Diagnosis not present

## 2023-12-01 DIAGNOSIS — K5190 Ulcerative colitis, unspecified, without complications: Secondary | ICD-10-CM | POA: Diagnosis not present

## 2023-12-01 DIAGNOSIS — I2699 Other pulmonary embolism without acute cor pulmonale: Secondary | ICD-10-CM | POA: Diagnosis not present

## 2023-12-01 DIAGNOSIS — Z981 Arthrodesis status: Secondary | ICD-10-CM | POA: Diagnosis not present

## 2023-12-01 DIAGNOSIS — Z8673 Personal history of transient ischemic attack (TIA), and cerebral infarction without residual deficits: Secondary | ICD-10-CM | POA: Diagnosis not present

## 2023-12-01 DIAGNOSIS — J440 Chronic obstructive pulmonary disease with acute lower respiratory infection: Secondary | ICD-10-CM | POA: Diagnosis not present

## 2023-12-01 DIAGNOSIS — G894 Chronic pain syndrome: Secondary | ICD-10-CM | POA: Diagnosis not present

## 2023-12-01 DIAGNOSIS — Z955 Presence of coronary angioplasty implant and graft: Secondary | ICD-10-CM | POA: Diagnosis not present

## 2023-12-01 DIAGNOSIS — J9621 Acute and chronic respiratory failure with hypoxia: Secondary | ICD-10-CM | POA: Diagnosis not present

## 2023-12-04 DIAGNOSIS — F17210 Nicotine dependence, cigarettes, uncomplicated: Secondary | ICD-10-CM | POA: Diagnosis not present

## 2023-12-04 DIAGNOSIS — J9621 Acute and chronic respiratory failure with hypoxia: Secondary | ICD-10-CM | POA: Diagnosis not present

## 2023-12-04 DIAGNOSIS — I2510 Atherosclerotic heart disease of native coronary artery without angina pectoris: Secondary | ICD-10-CM | POA: Diagnosis not present

## 2023-12-04 DIAGNOSIS — E876 Hypokalemia: Secondary | ICD-10-CM | POA: Diagnosis not present

## 2023-12-04 DIAGNOSIS — Z981 Arthrodesis status: Secondary | ICD-10-CM | POA: Diagnosis not present

## 2023-12-04 DIAGNOSIS — J441 Chronic obstructive pulmonary disease with (acute) exacerbation: Secondary | ICD-10-CM | POA: Diagnosis not present

## 2023-12-04 DIAGNOSIS — K449 Diaphragmatic hernia without obstruction or gangrene: Secondary | ICD-10-CM | POA: Diagnosis not present

## 2023-12-04 DIAGNOSIS — Z7951 Long term (current) use of inhaled steroids: Secondary | ICD-10-CM | POA: Diagnosis not present

## 2023-12-04 DIAGNOSIS — E871 Hypo-osmolality and hyponatremia: Secondary | ICD-10-CM | POA: Diagnosis not present

## 2023-12-04 DIAGNOSIS — Z8673 Personal history of transient ischemic attack (TIA), and cerebral infarction without residual deficits: Secondary | ICD-10-CM | POA: Diagnosis not present

## 2023-12-04 DIAGNOSIS — G894 Chronic pain syndrome: Secondary | ICD-10-CM | POA: Diagnosis not present

## 2023-12-04 DIAGNOSIS — I509 Heart failure, unspecified: Secondary | ICD-10-CM | POA: Diagnosis not present

## 2023-12-04 DIAGNOSIS — Z7901 Long term (current) use of anticoagulants: Secondary | ICD-10-CM | POA: Diagnosis not present

## 2023-12-04 DIAGNOSIS — J440 Chronic obstructive pulmonary disease with acute lower respiratory infection: Secondary | ICD-10-CM | POA: Diagnosis not present

## 2023-12-04 DIAGNOSIS — D649 Anemia, unspecified: Secondary | ICD-10-CM | POA: Diagnosis not present

## 2023-12-04 DIAGNOSIS — M359 Systemic involvement of connective tissue, unspecified: Secondary | ICD-10-CM | POA: Diagnosis not present

## 2023-12-04 DIAGNOSIS — Z955 Presence of coronary angioplasty implant and graft: Secondary | ICD-10-CM | POA: Diagnosis not present

## 2023-12-04 DIAGNOSIS — I2699 Other pulmonary embolism without acute cor pulmonale: Secondary | ICD-10-CM | POA: Diagnosis not present

## 2023-12-04 DIAGNOSIS — G43909 Migraine, unspecified, not intractable, without status migrainosus: Secondary | ICD-10-CM | POA: Diagnosis not present

## 2023-12-04 DIAGNOSIS — K5190 Ulcerative colitis, unspecified, without complications: Secondary | ICD-10-CM | POA: Diagnosis not present

## 2023-12-04 DIAGNOSIS — Z9981 Dependence on supplemental oxygen: Secondary | ICD-10-CM | POA: Diagnosis not present

## 2023-12-04 DIAGNOSIS — K219 Gastro-esophageal reflux disease without esophagitis: Secondary | ICD-10-CM | POA: Diagnosis not present

## 2023-12-10 ENCOUNTER — Telehealth: Payer: Self-pay

## 2023-12-10 DIAGNOSIS — Z981 Arthrodesis status: Secondary | ICD-10-CM | POA: Diagnosis not present

## 2023-12-10 DIAGNOSIS — G894 Chronic pain syndrome: Secondary | ICD-10-CM | POA: Diagnosis not present

## 2023-12-10 DIAGNOSIS — K219 Gastro-esophageal reflux disease without esophagitis: Secondary | ICD-10-CM | POA: Diagnosis not present

## 2023-12-10 DIAGNOSIS — Z9981 Dependence on supplemental oxygen: Secondary | ICD-10-CM | POA: Diagnosis not present

## 2023-12-10 DIAGNOSIS — J441 Chronic obstructive pulmonary disease with (acute) exacerbation: Secondary | ICD-10-CM | POA: Diagnosis not present

## 2023-12-10 DIAGNOSIS — I509 Heart failure, unspecified: Secondary | ICD-10-CM | POA: Diagnosis not present

## 2023-12-10 DIAGNOSIS — I2699 Other pulmonary embolism without acute cor pulmonale: Secondary | ICD-10-CM | POA: Diagnosis not present

## 2023-12-10 DIAGNOSIS — K449 Diaphragmatic hernia without obstruction or gangrene: Secondary | ICD-10-CM | POA: Diagnosis not present

## 2023-12-10 DIAGNOSIS — D649 Anemia, unspecified: Secondary | ICD-10-CM | POA: Diagnosis not present

## 2023-12-10 DIAGNOSIS — Z7901 Long term (current) use of anticoagulants: Secondary | ICD-10-CM | POA: Diagnosis not present

## 2023-12-10 DIAGNOSIS — G43909 Migraine, unspecified, not intractable, without status migrainosus: Secondary | ICD-10-CM | POA: Diagnosis not present

## 2023-12-10 DIAGNOSIS — M359 Systemic involvement of connective tissue, unspecified: Secondary | ICD-10-CM | POA: Diagnosis not present

## 2023-12-10 DIAGNOSIS — Z7951 Long term (current) use of inhaled steroids: Secondary | ICD-10-CM | POA: Diagnosis not present

## 2023-12-10 DIAGNOSIS — J9621 Acute and chronic respiratory failure with hypoxia: Secondary | ICD-10-CM | POA: Diagnosis not present

## 2023-12-10 DIAGNOSIS — Z8673 Personal history of transient ischemic attack (TIA), and cerebral infarction without residual deficits: Secondary | ICD-10-CM | POA: Diagnosis not present

## 2023-12-10 DIAGNOSIS — E876 Hypokalemia: Secondary | ICD-10-CM | POA: Diagnosis not present

## 2023-12-10 DIAGNOSIS — I2510 Atherosclerotic heart disease of native coronary artery without angina pectoris: Secondary | ICD-10-CM | POA: Diagnosis not present

## 2023-12-10 DIAGNOSIS — J440 Chronic obstructive pulmonary disease with acute lower respiratory infection: Secondary | ICD-10-CM | POA: Diagnosis not present

## 2023-12-10 DIAGNOSIS — F17210 Nicotine dependence, cigarettes, uncomplicated: Secondary | ICD-10-CM | POA: Diagnosis not present

## 2023-12-10 DIAGNOSIS — E871 Hypo-osmolality and hyponatremia: Secondary | ICD-10-CM | POA: Diagnosis not present

## 2023-12-10 DIAGNOSIS — K5190 Ulcerative colitis, unspecified, without complications: Secondary | ICD-10-CM | POA: Diagnosis not present

## 2023-12-10 DIAGNOSIS — Z955 Presence of coronary angioplasty implant and graft: Secondary | ICD-10-CM | POA: Diagnosis not present

## 2023-12-10 NOTE — Telephone Encounter (Signed)
 Will need to stop smoking a full 2 weeks if wants to be approved for surgery as this is relatively high risk she and her family will  need to understand and accept

## 2023-12-10 NOTE — Telephone Encounter (Signed)
 Surgical assessment received from [facility] neurosurgery and spine.  Cervical fusion pending.  LOV 10/22/2023. Office protocol is that patient must be seen within 60 days.    Dr. Waymond Hailey, please advise on surgical assessment and update last OV note.

## 2023-12-11 DIAGNOSIS — J440 Chronic obstructive pulmonary disease with acute lower respiratory infection: Secondary | ICD-10-CM | POA: Diagnosis not present

## 2023-12-11 DIAGNOSIS — R531 Weakness: Secondary | ICD-10-CM | POA: Diagnosis not present

## 2023-12-11 DIAGNOSIS — G894 Chronic pain syndrome: Secondary | ICD-10-CM | POA: Diagnosis not present

## 2023-12-11 DIAGNOSIS — I509 Heart failure, unspecified: Secondary | ICD-10-CM | POA: Diagnosis not present

## 2023-12-11 DIAGNOSIS — M359 Systemic involvement of connective tissue, unspecified: Secondary | ICD-10-CM | POA: Diagnosis not present

## 2023-12-11 DIAGNOSIS — Z8673 Personal history of transient ischemic attack (TIA), and cerebral infarction without residual deficits: Secondary | ICD-10-CM | POA: Diagnosis not present

## 2023-12-11 DIAGNOSIS — R0902 Hypoxemia: Secondary | ICD-10-CM | POA: Diagnosis not present

## 2023-12-11 DIAGNOSIS — S0083XA Contusion of other part of head, initial encounter: Secondary | ICD-10-CM | POA: Diagnosis not present

## 2023-12-11 DIAGNOSIS — I2699 Other pulmonary embolism without acute cor pulmonale: Secondary | ICD-10-CM | POA: Diagnosis not present

## 2023-12-11 DIAGNOSIS — J441 Chronic obstructive pulmonary disease with (acute) exacerbation: Secondary | ICD-10-CM | POA: Diagnosis not present

## 2023-12-11 DIAGNOSIS — J9621 Acute and chronic respiratory failure with hypoxia: Secondary | ICD-10-CM | POA: Diagnosis not present

## 2023-12-11 DIAGNOSIS — Z6821 Body mass index (BMI) 21.0-21.9, adult: Secondary | ICD-10-CM | POA: Diagnosis not present

## 2023-12-11 DIAGNOSIS — K449 Diaphragmatic hernia without obstruction or gangrene: Secondary | ICD-10-CM | POA: Diagnosis not present

## 2023-12-11 DIAGNOSIS — E876 Hypokalemia: Secondary | ICD-10-CM | POA: Diagnosis not present

## 2023-12-11 DIAGNOSIS — R296 Repeated falls: Secondary | ICD-10-CM | POA: Diagnosis not present

## 2023-12-11 DIAGNOSIS — G43909 Migraine, unspecified, not intractable, without status migrainosus: Secondary | ICD-10-CM | POA: Diagnosis not present

## 2023-12-11 DIAGNOSIS — K5190 Ulcerative colitis, unspecified, without complications: Secondary | ICD-10-CM | POA: Diagnosis not present

## 2023-12-11 DIAGNOSIS — Z7901 Long term (current) use of anticoagulants: Secondary | ICD-10-CM | POA: Diagnosis not present

## 2023-12-11 DIAGNOSIS — Z7951 Long term (current) use of inhaled steroids: Secondary | ICD-10-CM | POA: Diagnosis not present

## 2023-12-11 DIAGNOSIS — F17210 Nicotine dependence, cigarettes, uncomplicated: Secondary | ICD-10-CM | POA: Diagnosis not present

## 2023-12-11 DIAGNOSIS — K219 Gastro-esophageal reflux disease without esophagitis: Secondary | ICD-10-CM | POA: Diagnosis not present

## 2023-12-11 DIAGNOSIS — D649 Anemia, unspecified: Secondary | ICD-10-CM | POA: Diagnosis not present

## 2023-12-11 DIAGNOSIS — I2510 Atherosclerotic heart disease of native coronary artery without angina pectoris: Secondary | ICD-10-CM | POA: Diagnosis not present

## 2023-12-11 DIAGNOSIS — Z981 Arthrodesis status: Secondary | ICD-10-CM | POA: Diagnosis not present

## 2023-12-11 DIAGNOSIS — Z9981 Dependence on supplemental oxygen: Secondary | ICD-10-CM | POA: Diagnosis not present

## 2023-12-11 DIAGNOSIS — Z955 Presence of coronary angioplasty implant and graft: Secondary | ICD-10-CM | POA: Diagnosis not present

## 2023-12-11 DIAGNOSIS — E871 Hypo-osmolality and hyponatremia: Secondary | ICD-10-CM | POA: Diagnosis not present

## 2023-12-11 NOTE — Telephone Encounter (Signed)
 Copy of this encounter was faxed by Aden Agreste via [place] to fax number: [phone]

## 2023-12-16 DIAGNOSIS — M359 Systemic involvement of connective tissue, unspecified: Secondary | ICD-10-CM | POA: Diagnosis not present

## 2023-12-16 DIAGNOSIS — J440 Chronic obstructive pulmonary disease with acute lower respiratory infection: Secondary | ICD-10-CM | POA: Diagnosis not present

## 2023-12-16 DIAGNOSIS — D649 Anemia, unspecified: Secondary | ICD-10-CM | POA: Diagnosis not present

## 2023-12-16 DIAGNOSIS — K219 Gastro-esophageal reflux disease without esophagitis: Secondary | ICD-10-CM | POA: Diagnosis not present

## 2023-12-16 DIAGNOSIS — G894 Chronic pain syndrome: Secondary | ICD-10-CM | POA: Diagnosis not present

## 2023-12-16 DIAGNOSIS — I509 Heart failure, unspecified: Secondary | ICD-10-CM | POA: Diagnosis not present

## 2023-12-16 DIAGNOSIS — Z955 Presence of coronary angioplasty implant and graft: Secondary | ICD-10-CM | POA: Diagnosis not present

## 2023-12-16 DIAGNOSIS — Z9981 Dependence on supplemental oxygen: Secondary | ICD-10-CM | POA: Diagnosis not present

## 2023-12-16 DIAGNOSIS — K449 Diaphragmatic hernia without obstruction or gangrene: Secondary | ICD-10-CM | POA: Diagnosis not present

## 2023-12-16 DIAGNOSIS — Z8673 Personal history of transient ischemic attack (TIA), and cerebral infarction without residual deficits: Secondary | ICD-10-CM | POA: Diagnosis not present

## 2023-12-16 DIAGNOSIS — Z7901 Long term (current) use of anticoagulants: Secondary | ICD-10-CM | POA: Diagnosis not present

## 2023-12-16 DIAGNOSIS — F17210 Nicotine dependence, cigarettes, uncomplicated: Secondary | ICD-10-CM | POA: Diagnosis not present

## 2023-12-16 DIAGNOSIS — K5190 Ulcerative colitis, unspecified, without complications: Secondary | ICD-10-CM | POA: Diagnosis not present

## 2023-12-16 DIAGNOSIS — Z981 Arthrodesis status: Secondary | ICD-10-CM | POA: Diagnosis not present

## 2023-12-16 DIAGNOSIS — E871 Hypo-osmolality and hyponatremia: Secondary | ICD-10-CM | POA: Diagnosis not present

## 2023-12-16 DIAGNOSIS — Z7951 Long term (current) use of inhaled steroids: Secondary | ICD-10-CM | POA: Diagnosis not present

## 2023-12-16 DIAGNOSIS — J9621 Acute and chronic respiratory failure with hypoxia: Secondary | ICD-10-CM | POA: Diagnosis not present

## 2023-12-16 DIAGNOSIS — I2699 Other pulmonary embolism without acute cor pulmonale: Secondary | ICD-10-CM | POA: Diagnosis not present

## 2023-12-16 DIAGNOSIS — I2510 Atherosclerotic heart disease of native coronary artery without angina pectoris: Secondary | ICD-10-CM | POA: Diagnosis not present

## 2023-12-16 DIAGNOSIS — G43909 Migraine, unspecified, not intractable, without status migrainosus: Secondary | ICD-10-CM | POA: Diagnosis not present

## 2023-12-16 DIAGNOSIS — E876 Hypokalemia: Secondary | ICD-10-CM | POA: Diagnosis not present

## 2023-12-16 DIAGNOSIS — J441 Chronic obstructive pulmonary disease with (acute) exacerbation: Secondary | ICD-10-CM | POA: Diagnosis not present

## 2023-12-17 DIAGNOSIS — R531 Weakness: Secondary | ICD-10-CM | POA: Diagnosis not present

## 2023-12-17 DIAGNOSIS — S0093XA Contusion of unspecified part of head, initial encounter: Secondary | ICD-10-CM | POA: Diagnosis not present

## 2023-12-19 DIAGNOSIS — E871 Hypo-osmolality and hyponatremia: Secondary | ICD-10-CM | POA: Diagnosis not present

## 2023-12-19 DIAGNOSIS — M359 Systemic involvement of connective tissue, unspecified: Secondary | ICD-10-CM | POA: Diagnosis not present

## 2023-12-19 DIAGNOSIS — J441 Chronic obstructive pulmonary disease with (acute) exacerbation: Secondary | ICD-10-CM | POA: Diagnosis not present

## 2023-12-19 DIAGNOSIS — J440 Chronic obstructive pulmonary disease with acute lower respiratory infection: Secondary | ICD-10-CM | POA: Diagnosis not present

## 2023-12-19 DIAGNOSIS — D649 Anemia, unspecified: Secondary | ICD-10-CM | POA: Diagnosis not present

## 2023-12-19 DIAGNOSIS — E876 Hypokalemia: Secondary | ICD-10-CM | POA: Diagnosis not present

## 2023-12-19 DIAGNOSIS — K449 Diaphragmatic hernia without obstruction or gangrene: Secondary | ICD-10-CM | POA: Diagnosis not present

## 2023-12-19 DIAGNOSIS — Z8673 Personal history of transient ischemic attack (TIA), and cerebral infarction without residual deficits: Secondary | ICD-10-CM | POA: Diagnosis not present

## 2023-12-19 DIAGNOSIS — I2510 Atherosclerotic heart disease of native coronary artery without angina pectoris: Secondary | ICD-10-CM | POA: Diagnosis not present

## 2023-12-19 DIAGNOSIS — Z955 Presence of coronary angioplasty implant and graft: Secondary | ICD-10-CM | POA: Diagnosis not present

## 2023-12-19 DIAGNOSIS — G894 Chronic pain syndrome: Secondary | ICD-10-CM | POA: Diagnosis not present

## 2023-12-19 DIAGNOSIS — K219 Gastro-esophageal reflux disease without esophagitis: Secondary | ICD-10-CM | POA: Diagnosis not present

## 2023-12-19 DIAGNOSIS — Z981 Arthrodesis status: Secondary | ICD-10-CM | POA: Diagnosis not present

## 2023-12-19 DIAGNOSIS — G43909 Migraine, unspecified, not intractable, without status migrainosus: Secondary | ICD-10-CM | POA: Diagnosis not present

## 2023-12-19 DIAGNOSIS — J9621 Acute and chronic respiratory failure with hypoxia: Secondary | ICD-10-CM | POA: Diagnosis not present

## 2023-12-19 DIAGNOSIS — F17210 Nicotine dependence, cigarettes, uncomplicated: Secondary | ICD-10-CM | POA: Diagnosis not present

## 2023-12-19 DIAGNOSIS — K5190 Ulcerative colitis, unspecified, without complications: Secondary | ICD-10-CM | POA: Diagnosis not present

## 2023-12-19 DIAGNOSIS — Z9981 Dependence on supplemental oxygen: Secondary | ICD-10-CM | POA: Diagnosis not present

## 2023-12-19 DIAGNOSIS — I2699 Other pulmonary embolism without acute cor pulmonale: Secondary | ICD-10-CM | POA: Diagnosis not present

## 2023-12-19 DIAGNOSIS — I509 Heart failure, unspecified: Secondary | ICD-10-CM | POA: Diagnosis not present

## 2023-12-19 DIAGNOSIS — Z7901 Long term (current) use of anticoagulants: Secondary | ICD-10-CM | POA: Diagnosis not present

## 2023-12-19 DIAGNOSIS — Z7951 Long term (current) use of inhaled steroids: Secondary | ICD-10-CM | POA: Diagnosis not present

## 2023-12-22 DIAGNOSIS — I5022 Chronic systolic (congestive) heart failure: Secondary | ICD-10-CM | POA: Diagnosis not present

## 2023-12-22 DIAGNOSIS — K219 Gastro-esophageal reflux disease without esophagitis: Secondary | ICD-10-CM | POA: Diagnosis not present

## 2023-12-22 DIAGNOSIS — R0600 Dyspnea, unspecified: Secondary | ICD-10-CM | POA: Diagnosis not present

## 2023-12-22 DIAGNOSIS — R918 Other nonspecific abnormal finding of lung field: Secondary | ICD-10-CM | POA: Diagnosis not present

## 2023-12-22 DIAGNOSIS — J441 Chronic obstructive pulmonary disease with (acute) exacerbation: Secondary | ICD-10-CM | POA: Diagnosis not present

## 2023-12-22 DIAGNOSIS — R0902 Hypoxemia: Secondary | ICD-10-CM | POA: Diagnosis not present

## 2023-12-22 DIAGNOSIS — I2510 Atherosclerotic heart disease of native coronary artery without angina pectoris: Secondary | ICD-10-CM | POA: Diagnosis not present

## 2023-12-22 DIAGNOSIS — Z6822 Body mass index (BMI) 22.0-22.9, adult: Secondary | ICD-10-CM | POA: Diagnosis not present

## 2023-12-22 DIAGNOSIS — Z86711 Personal history of pulmonary embolism: Secondary | ICD-10-CM | POA: Diagnosis not present

## 2023-12-22 DIAGNOSIS — I5020 Unspecified systolic (congestive) heart failure: Secondary | ICD-10-CM | POA: Diagnosis not present

## 2023-12-22 DIAGNOSIS — Z720 Tobacco use: Secondary | ICD-10-CM | POA: Diagnosis not present

## 2023-12-22 DIAGNOSIS — Z7951 Long term (current) use of inhaled steroids: Secondary | ICD-10-CM | POA: Diagnosis not present

## 2023-12-22 DIAGNOSIS — Z79899 Other long term (current) drug therapy: Secondary | ICD-10-CM | POA: Diagnosis not present

## 2023-12-22 DIAGNOSIS — R0602 Shortness of breath: Secondary | ICD-10-CM | POA: Diagnosis not present

## 2023-12-22 DIAGNOSIS — Z882 Allergy status to sulfonamides status: Secondary | ICD-10-CM | POA: Diagnosis not present

## 2023-12-22 DIAGNOSIS — Z885 Allergy status to narcotic agent status: Secondary | ICD-10-CM | POA: Diagnosis not present

## 2023-12-22 DIAGNOSIS — J449 Chronic obstructive pulmonary disease, unspecified: Secondary | ICD-10-CM | POA: Diagnosis not present

## 2023-12-22 DIAGNOSIS — F17210 Nicotine dependence, cigarettes, uncomplicated: Secondary | ICD-10-CM | POA: Diagnosis not present

## 2023-12-26 DIAGNOSIS — K5190 Ulcerative colitis, unspecified, without complications: Secondary | ICD-10-CM | POA: Diagnosis not present

## 2023-12-26 DIAGNOSIS — F17210 Nicotine dependence, cigarettes, uncomplicated: Secondary | ICD-10-CM | POA: Diagnosis not present

## 2023-12-26 DIAGNOSIS — G894 Chronic pain syndrome: Secondary | ICD-10-CM | POA: Diagnosis not present

## 2023-12-26 DIAGNOSIS — D649 Anemia, unspecified: Secondary | ICD-10-CM | POA: Diagnosis not present

## 2023-12-26 DIAGNOSIS — I2699 Other pulmonary embolism without acute cor pulmonale: Secondary | ICD-10-CM | POA: Diagnosis not present

## 2023-12-26 DIAGNOSIS — Z8673 Personal history of transient ischemic attack (TIA), and cerebral infarction without residual deficits: Secondary | ICD-10-CM | POA: Diagnosis not present

## 2023-12-26 DIAGNOSIS — J440 Chronic obstructive pulmonary disease with acute lower respiratory infection: Secondary | ICD-10-CM | POA: Diagnosis not present

## 2023-12-26 DIAGNOSIS — Z7901 Long term (current) use of anticoagulants: Secondary | ICD-10-CM | POA: Diagnosis not present

## 2023-12-26 DIAGNOSIS — Z9981 Dependence on supplemental oxygen: Secondary | ICD-10-CM | POA: Diagnosis not present

## 2023-12-26 DIAGNOSIS — E876 Hypokalemia: Secondary | ICD-10-CM | POA: Diagnosis not present

## 2023-12-26 DIAGNOSIS — Z7951 Long term (current) use of inhaled steroids: Secondary | ICD-10-CM | POA: Diagnosis not present

## 2023-12-26 DIAGNOSIS — J9621 Acute and chronic respiratory failure with hypoxia: Secondary | ICD-10-CM | POA: Diagnosis not present

## 2023-12-26 DIAGNOSIS — J441 Chronic obstructive pulmonary disease with (acute) exacerbation: Secondary | ICD-10-CM | POA: Diagnosis not present

## 2023-12-26 DIAGNOSIS — K219 Gastro-esophageal reflux disease without esophagitis: Secondary | ICD-10-CM | POA: Diagnosis not present

## 2023-12-26 DIAGNOSIS — E871 Hypo-osmolality and hyponatremia: Secondary | ICD-10-CM | POA: Diagnosis not present

## 2023-12-26 DIAGNOSIS — I509 Heart failure, unspecified: Secondary | ICD-10-CM | POA: Diagnosis not present

## 2023-12-26 DIAGNOSIS — Z955 Presence of coronary angioplasty implant and graft: Secondary | ICD-10-CM | POA: Diagnosis not present

## 2023-12-26 DIAGNOSIS — K449 Diaphragmatic hernia without obstruction or gangrene: Secondary | ICD-10-CM | POA: Diagnosis not present

## 2023-12-26 DIAGNOSIS — M359 Systemic involvement of connective tissue, unspecified: Secondary | ICD-10-CM | POA: Diagnosis not present

## 2023-12-26 DIAGNOSIS — Z981 Arthrodesis status: Secondary | ICD-10-CM | POA: Diagnosis not present

## 2023-12-26 DIAGNOSIS — I2510 Atherosclerotic heart disease of native coronary artery without angina pectoris: Secondary | ICD-10-CM | POA: Diagnosis not present

## 2023-12-26 DIAGNOSIS — G43909 Migraine, unspecified, not intractable, without status migrainosus: Secondary | ICD-10-CM | POA: Diagnosis not present

## 2023-12-29 DIAGNOSIS — K5190 Ulcerative colitis, unspecified, without complications: Secondary | ICD-10-CM | POA: Diagnosis not present

## 2023-12-29 DIAGNOSIS — M549 Dorsalgia, unspecified: Secondary | ICD-10-CM | POA: Diagnosis not present

## 2023-12-29 DIAGNOSIS — I219 Acute myocardial infarction, unspecified: Secondary | ICD-10-CM | POA: Diagnosis not present

## 2023-12-31 DIAGNOSIS — I509 Heart failure, unspecified: Secondary | ICD-10-CM | POA: Diagnosis not present

## 2023-12-31 DIAGNOSIS — Z8673 Personal history of transient ischemic attack (TIA), and cerebral infarction without residual deficits: Secondary | ICD-10-CM | POA: Diagnosis not present

## 2023-12-31 DIAGNOSIS — M359 Systemic involvement of connective tissue, unspecified: Secondary | ICD-10-CM | POA: Diagnosis not present

## 2023-12-31 DIAGNOSIS — J9621 Acute and chronic respiratory failure with hypoxia: Secondary | ICD-10-CM | POA: Diagnosis not present

## 2023-12-31 DIAGNOSIS — E871 Hypo-osmolality and hyponatremia: Secondary | ICD-10-CM | POA: Diagnosis not present

## 2023-12-31 DIAGNOSIS — Z7951 Long term (current) use of inhaled steroids: Secondary | ICD-10-CM | POA: Diagnosis not present

## 2023-12-31 DIAGNOSIS — K219 Gastro-esophageal reflux disease without esophagitis: Secondary | ICD-10-CM | POA: Diagnosis not present

## 2023-12-31 DIAGNOSIS — K5190 Ulcerative colitis, unspecified, without complications: Secondary | ICD-10-CM | POA: Diagnosis not present

## 2023-12-31 DIAGNOSIS — J441 Chronic obstructive pulmonary disease with (acute) exacerbation: Secondary | ICD-10-CM | POA: Diagnosis not present

## 2023-12-31 DIAGNOSIS — J440 Chronic obstructive pulmonary disease with acute lower respiratory infection: Secondary | ICD-10-CM | POA: Diagnosis not present

## 2023-12-31 DIAGNOSIS — E876 Hypokalemia: Secondary | ICD-10-CM | POA: Diagnosis not present

## 2023-12-31 DIAGNOSIS — K449 Diaphragmatic hernia without obstruction or gangrene: Secondary | ICD-10-CM | POA: Diagnosis not present

## 2023-12-31 DIAGNOSIS — Z7901 Long term (current) use of anticoagulants: Secondary | ICD-10-CM | POA: Diagnosis not present

## 2023-12-31 DIAGNOSIS — Z955 Presence of coronary angioplasty implant and graft: Secondary | ICD-10-CM | POA: Diagnosis not present

## 2023-12-31 DIAGNOSIS — F17210 Nicotine dependence, cigarettes, uncomplicated: Secondary | ICD-10-CM | POA: Diagnosis not present

## 2023-12-31 DIAGNOSIS — D649 Anemia, unspecified: Secondary | ICD-10-CM | POA: Diagnosis not present

## 2023-12-31 DIAGNOSIS — G43909 Migraine, unspecified, not intractable, without status migrainosus: Secondary | ICD-10-CM | POA: Diagnosis not present

## 2023-12-31 DIAGNOSIS — Z981 Arthrodesis status: Secondary | ICD-10-CM | POA: Diagnosis not present

## 2023-12-31 DIAGNOSIS — Z9981 Dependence on supplemental oxygen: Secondary | ICD-10-CM | POA: Diagnosis not present

## 2023-12-31 DIAGNOSIS — G894 Chronic pain syndrome: Secondary | ICD-10-CM | POA: Diagnosis not present

## 2023-12-31 DIAGNOSIS — I2699 Other pulmonary embolism without acute cor pulmonale: Secondary | ICD-10-CM | POA: Diagnosis not present

## 2023-12-31 DIAGNOSIS — I2510 Atherosclerotic heart disease of native coronary artery without angina pectoris: Secondary | ICD-10-CM | POA: Diagnosis not present

## 2024-01-21 DIAGNOSIS — R0902 Hypoxemia: Secondary | ICD-10-CM | POA: Diagnosis not present

## 2024-01-21 DIAGNOSIS — J441 Chronic obstructive pulmonary disease with (acute) exacerbation: Secondary | ICD-10-CM | POA: Diagnosis not present

## 2024-01-26 DIAGNOSIS — I2510 Atherosclerotic heart disease of native coronary artery without angina pectoris: Secondary | ICD-10-CM | POA: Diagnosis not present

## 2024-01-26 DIAGNOSIS — M549 Dorsalgia, unspecified: Secondary | ICD-10-CM | POA: Diagnosis not present

## 2024-01-26 DIAGNOSIS — K5190 Ulcerative colitis, unspecified, without complications: Secondary | ICD-10-CM | POA: Diagnosis not present

## 2024-02-04 ENCOUNTER — Ambulatory Visit: Admitting: Internal Medicine

## 2024-02-04 ENCOUNTER — Ambulatory Visit: Payer: Self-pay

## 2024-02-04 NOTE — Telephone Encounter (Signed)
 Reason for Disposition . [1] MILD difficulty breathing (e.g., minimal/no SOB at rest, SOB with walking, pulse < 100) AND [2] still present when not coughing . [1] Continuous (nonstop) coughing interferes with work or school AND [2] no improvement using cough treatment per Care Advice  Answer Assessment - Initial Assessment Questions 1. ONSET: When did the cough begin?      X 4 days, describes it as croup sounding cough  2. SEVERITY: How bad is the cough today?      Not as bad as yesterday, still constant  3. SPUTUM: Describe the color of your sputum (e.g., none, dry cough; clear, white, yellow, green)     Clear/white  4. HEMOPTYSIS: Are you coughing up any blood? If Yes, ask: How much? (e.g., flecks, streaks, tablespoons, etc.)     No  5. DIFFICULTY BREATHING: Are you having difficulty breathing? If Yes, ask: How bad is it? (e.g., mild, moderate, severe)    Mild SOB, on O2 at 3L (normal level)  6. FEVER: Do you have a fever? If Yes, ask: What is your temperature, how was it measured, and when did it start?      No  7. CARDIAC HISTORY: Do you have any history of heart disease? (e.g., heart attack, congestive heart failure)      2 heart stints  8. LUNG HISTORY: Do you have any history of lung disease?  (e.g., pulmonary embolus, asthma, emphysema)     COPD  9. PE RISK FACTORS: Do you have a history of blood clots? (or: recent major surgery, recent prolonged travel, bedridden)     Yes, currently on Eliquis    10. OTHER SYMPTOMS: Do you have any other symptoms? (e.g., runny nose, wheezing, chest pain)       Runny   For home care she has not taken any medication for the symptoms.  Protocols used: Cough - Acute Productive-A-AH

## 2024-02-04 NOTE — Telephone Encounter (Addendum)
 FYI Only or Action Required?: FYI only for provider.  Patient was last seen in Pulmonology on 10/22/23  Called Nurse Triage reporting Cough.  Symptoms began x 4 days.  Interventions attempted: Nothing.  Symptoms are: unchanged.  Triage Disposition: See Physician Within 24 Hours, See HCP Within 4 Hours (Or PCP Triage)  Patient/caregiver understands and will follow disposition?: Yes  **Appt scheduled for 8/6**      1. ONSET: When did the cough begin?      X 4 days, describes it as croup sounding cough  2. SEVERITY: How bad is the cough today?      Not as bad as yesterday, still constant  3. SPUTUM: Describe the color of your sputum (e.g., none, dry cough; clear, white, yellow, green)     Clear/white  4. HEMOPTYSIS: Are you coughing up any blood? If Yes, ask: How much? (e.g., flecks, streaks, tablespoons, etc.)     No  5. DIFFICULTY BREATHING: Are you having difficulty breathing? If Yes, ask: How bad is it? (e.g., mild, moderate, severe)    Mild SOB, on O2 at 3L (normal level)  6. FEVER: Do you have a fever? If Yes, ask: What is your temperature, how was it measured, and when did it start?      No  7. CARDIAC HISTORY: Do you have any history of heart disease? (e.g., heart attack, congestive heart failure)      2 heart stints  8. LUNG HISTORY: Do you have any history of lung disease?  (e.g., pulmonary embolus, asthma, emphysema)     COPD  9. PE RISK FACTORS: Do you have a history of blood clots? (or: recent major surgery, recent prolonged travel, bedridden)     Yes, currently on Eliquis    10. OTHER SYMPTOMS: Do you have any other symptoms? (e.g., runny nose, wheezing, chest pain)       Runny Nose  For home care she has not taken any medication for the symptoms.                  Copied from CRM #[id number]. Topic: Clinical - Red Word Triage >> Feb 04, 2024  8:24 AM Isabell A wrote: Red Word that prompted transfer to Nurse  Triage: Patient states she is experiencing congestion in her lungs, a lot of coughing, she is on oxygen  but isn't feeling any SOB as of right now, feeling extremely weak.

## 2024-02-05 NOTE — Telephone Encounter (Signed)
 Dr Darlean, please advise any recommendations for pt. Pt no showed their appt with Dr Meade yesterday.

## 2024-02-05 NOTE — Telephone Encounter (Signed)
 ATC X1. LMTCB. Pt already has an upcoming appt with Dr Darlean on 02-12-24

## 2024-02-09 NOTE — Telephone Encounter (Signed)
 ATC X2. LMTCB. I will send a message in Mychart informing pt to keep her upcoming appointment and Dr Darlean will address symptoms then, and then completing note. NFN

## 2024-02-12 ENCOUNTER — Ambulatory Visit (INDEPENDENT_AMBULATORY_CARE_PROVIDER_SITE_OTHER): Admitting: Internal Medicine

## 2024-02-12 ENCOUNTER — Telehealth: Payer: Self-pay | Admitting: Internal Medicine

## 2024-02-12 ENCOUNTER — Encounter: Payer: Self-pay | Admitting: Internal Medicine

## 2024-02-12 VITALS — BP 109/68 | HR 80 | Ht 64.0 in | Wt 122.4 lb

## 2024-02-12 DIAGNOSIS — I2699 Other pulmonary embolism without acute cor pulmonale: Secondary | ICD-10-CM

## 2024-02-12 DIAGNOSIS — J9611 Chronic respiratory failure with hypoxia: Secondary | ICD-10-CM | POA: Diagnosis not present

## 2024-02-12 DIAGNOSIS — J449 Chronic obstructive pulmonary disease, unspecified: Secondary | ICD-10-CM

## 2024-02-12 DIAGNOSIS — F17210 Nicotine dependence, cigarettes, uncomplicated: Secondary | ICD-10-CM | POA: Diagnosis not present

## 2024-02-12 NOTE — Telephone Encounter (Signed)
 Need to return for cxr to complete the w/u

## 2024-02-12 NOTE — Patient Instructions (Signed)
 No change in medications for now  Make sure you check your oxygen  saturation at your highest level of activity(NOT after you stop)  to be sure it stays over 90% and keep track of it at least once a week, more often if breathing getting worse, and let me know if losing ground. (Collect the dots to connect the dots approach)    Please remember to go to the  x-ray department  @  [facility] for your tests - we will call you with the results when they are available      Please schedule a follow up office visit in 6 weeks, call sooner if needed

## 2024-02-12 NOTE — Progress Notes (Signed)
 EMELIE NEWSOM, female    DOB: 25-Dec-1960   MRN: [id number]   Brief patient profile:  62 yowf active smoker  referred to pulmonary clinic in [place]  07/10/2021 by Curtistine Medley PA for copd with symptoms starting around 2012  prev eval by Dr Theophilus with GOLD 0 criteria/ AB phenotype  but requested office closer to home     History of Present Illness  07/10/2021  Pulmonary/ 1st office eval/ Aryanne [place] / [place] [facility]  on advair 250 Chief Complaint  Patient presents with   New Patient (Initial Visit)    Patient here for copd and emphysema. Ref by cardiologist. States she is using 4LO2 cont. At night all night and prn during day. Coughing up green/brown mucus.   Dyspnea:  [employer] shopping / no HC parking / some limited by neck from houswork Cough: every night at hs after lie down and also while asleep x one year / augmentin  best also helps nasal congestion/ coughs so hard gets fainty Sleep: bed is flat with pillows x 40 degrees with overt hb / on 02  SABA use: twice daily hfa/ neb once every 2 weeks Rec Plan A = Automatic = Always=    Symbicort  80 Take 2 puffs first thing in am and then another 2 puffs about 12 hours later.  Work on inhaler technique:  Plan B = Backup (to supplement plan A, not to replace it) Only use your albuterol  inhaler as a rescue medication Plan C = Crisis (instead of Plan B but only if Plan B stops working) - only use your albuterol  nebulizer if you first try Plan B  For cough /congestion > mucinex  dm 1200 mg twice daily = 2400 mg total per 24 hours Prednisone  10 mg take  4 each am x 2 days,   2 each am x 2 days,  1 each am x 2 days and stop  Augmentin  875 mg take one pill twice daily  X 10 days  Pantoprazole  (protonix ) 40 mg   Take  30-60 min before first meal of the day and Pepcid  (famotidine )  20 mg after supper until return to office  GERD diet reviewed, bed blocks rec  The key is to stop smoking completely before smoking completely stops you!      09/10/2023  f/u ov/[place] office/Rhyan Radler re: AB maint on symb 80  did not  bring meds / did not return as requested, and now needing clearance for neck surgery which she was forced to cancel due to resp cc / still smoking but cutting dwon  Chief Complaint  Patient presents with   Medical Clearance    Pt is here for surgery clearance , she also is having coughing congestion  she is taking doxcy. 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 Thick/ assoc with nasal congestion same mucus  Sleeping: bed with bunch of pillows or recliner x months   resp cc  SABA use: puff sev per day  02: prn  Rec Plan A = Automatic = Always=    Symbicort  80 (or breztri )  Take 2 puffs first thing in am and then another 2 puffs about 12 hours later.  Work on inhaler technique:   Plan B = Backup (to supplement plan A, not to replace it) Only use your albuterol  inhaler as a rescue medication Plan C = Crisis (instead of Plan B but only if Plan B stops working) - only  use your albuterol  nebulizer if you first try Plan B and it fails to help > For cough/ congestion > mucinex  or mucinex  dm  up to maximum of  1200 mg every 12 hours and use the flutter valve as much as you can   Prednisone  10 mg take  4 each am x 2 days,   2 each am x 2 days,  1 each am x 2 days and stop  Augmentin  875 mg take one pill twice daily  X 10 days  Pantoprazole  (protonix ) 40 mg   Take  30-60 min before first meal of the day and Pepcid  (famotidine )  20 mg after supper until return to office GERD diet reviewed, bed blocks rec   The key is to stop smoking completely before smoking completely stops you!     10/22/2023  f/u ov/[place] office/Ezzie Senat re: AB maint on Symbicort  80 did  bring meds - was much better transiently p last round of pred and augmentin  x one week then worse again/ no change active smoking  Chief Complaint  Patient presents with   Results   COPD   Dyspnea:  pushing buggy at  [employer]  speed vs peers Cough: worse am thick gey Sleeping: flat bed propped up on a bunch of pillows s  resp cc  SABA use: twice a day albuterol  hfa  02: concentrator is broken Rec For cough/ congestion > mucinex  or mucinex  dm  up to maximum of  1200 mg every 12 hours and use the flutter valve as much as you can   Once you know the date of surgery, go ahead and start these at least 7 days pre op. Prednisone  10 mg Take 4 for two days three for two days two for two days one for two days  Augmentin  875 mg take one pill twice daily  X 10 days Pantoprazole  (protonix ) 40 mg   Take  30-60 min before first meal of the day and Pepcid  (famotidine )  20 mg after supper until return to office  The key is to stop smoking completely before smoking completely stops you!  Please schedule a follow up visit in 3 months but call sooner if needed    Admission date:  11/17/2023   Discharge Date:  11/26/2023  Recommendations for primary care physician for things to follow: Admission Diagnosis  Pneumonia, organism unspecified(486) [J18.9]  Discharge Diagnosis  Pneumonia, organism unspecified(486) [J18.9]     Pneumonia, organism unspecified(486)   COPD with acute exacerbation (HCC)   Acute on chronic hypoxic respiratory failure (HCC)   GERD (gastroesophageal reflux disease)   Tobacco use disorder   Community acquired pneumonia   Substance abuse (HCC)   Opioid dependence on agonist therapy (HCC)     HPI  from the history and physical done on the day of admission:    HPI: DEVERA ENGLANDER is a 63 y.o. female with medical history significant for ongoing tobacco abuse, COPD with chronic hypoxic respiratory failure on 3 L of oxygen  via nasal cannula PTA, prior history of pulmonary embolism, CAD with prior angioplasty and stent placement in 2001, GERD with prior history of duodenal ulcers, and prior history of CVA presents to the ED with worsening shortness of breath and worsening hypoxia. - Over the last 4 to 5  days PTA patient has had episodes of recurrent emesis, she later developed right-sided chest wall and rib cage as well as anterior abdominal wall and flank pain presumably from excessive vomiting and retching. - Emesis  was without bile or blood - No diarrhea - Cough becoming more productive In ED chest x-ray shows--IMPRESSION: *Heterogeneous opacities at the bilateral lung bases, slightly more pronounced than the prior exam. Findings may represent  possible pneumonitis. Troponin 5 >>4, EKG shows sinus tachycardia without acute changes - SOdium 134 potassium 3.4 bicarb 28 creatinine 0.84 LFTs are not elevated - WBC 17.3, Hgb 10.9 and platelets 311 - Flu COVID and RSV were negative on 11/14/2023   Hospital Course:    1)Acute on chronic hypoxic respiratory failure: Due to combination of pneumonia and PE--- - Much improved overall - Currently back to 2 L of oxygen  via nasal cannula from 5 L -Post ambulation O2 sats on 3 L of oxygen  acceptable--- please see nursing documentation   2)Acute RUL PE: No evidence of RV strain radiographically or on echo,  -- LE venous U/S without DVT. - Continue DOAC (30-day copay is $12.15). 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   - Note +rhinovirus, droplet precautions.  -Much improved clinically after interventions as above   4) acute COPD exacerbation--due to #3 above -treated with Solu-Medrol  -Overall much improved -Discharge on p.o. prednisone  mucolytics and bronchodilators   5)Tobacco use:  Smoking cessation counseling for 4 minutes today,  Give Nicotine  patch I  6)GERD--- continue Protonix    7)Chronic pain:  - Continue buprenorphine  and gabapentin     02/12/2024  ACUTE  ov/[place] office/Jenniah Bhavsar re: AB/02 dep  maint on Breztri     still  smoker  Chief  Complaint  Patient presents with   COPD    58m f/u  DOE :  not back at [employer] yet/ newly now @ 3lpm 02 24/7 Cough: am x sev hours > mucoid only  Sleeping: bed bed/ lots of pillows  s  resp cc  SABA use: rare hfa/ never neb 02: 3lpm 24/7 cont - not titrating   No obvious day to day or daytime variability or assoc excess/ purulent sputum or mucus plugs or hemoptysis or cp or chest tightness, subjective wheeze or overt sinus or hb symptoms.    Also denies any obvious fluctuation of symptoms with weather or environmental changes or other aggravating or alleviating factors except as outlined above   No unusual exposure hx or h/o childhood pna/ asthma or knowledge of premature birth.  Current Allergies, Complete Past Medical History, Past Surgical History, Family History, and Social History were reviewed in [employer] record.  ROS  The following are not active complaints unless bolded Hoarseness, sore throat, dysphagia, dental problems, itching, sneezing,  nasal congestion or discharge of excess mucus or purulent secretions, ear ache,   fever, chills, sweats, unintended wt loss or wt gain, classically pleuritic or exertional cp,  orthopnea pnd or arm/hand swelling  or leg swelling, presyncope, palpitations, abdominal pain, anorexia, nausea, vomiting, diarrhea  or change in bowel habits or change in bladder habits, change in stools or change in urine, dysuria, hematuria,  rash, arthralgias, visual complaints, headache, numbness, weakness or ataxia or problems with walking or coordination,  change in mood or  memory.        Current Meds  Medication Sig   acetaminophen  (TYLENOL ) 325 MG tablet Take 2 tablets (650 mg total) by mouth every 6 (six) hours as needed for mild pain (pain score 1-3) (or Fever >/=  101).   albuterol  (PROVENTIL ) (2.5 MG/3ML) 0.083% nebulizer solution Take 3 mLs (2.5 mg total) by nebulization every 4 (four) hours as needed for wheezing or shortness of  breath.   albuterol  (VENTOLIN  HFA) 108 (90 Base) MCG/ACT inhaler Inhale 2 puffs into the lungs every 4 (four) hours as needed for shortness of breath or wheezing.   apixaban  (ELIQUIS ) 5 MG TABS tablet Take 1 tablet (5 mg total) by mouth 2 (two) times daily.   budesonide -glycopyrrolate-formoterol  (BREZTRI  AEROSPHERE) 160-9-4.8 MCG/ACT AERO inhaler Inhale 2 puffs into the lungs in the morning and at bedtime.   buprenorphine  (SUBUTEX ) 8 MG SUBL SL tablet Place 8 mg under the tongue in the morning and at bedtime.   buPROPion  (WELLBUTRIN  SR) 150 MG 12 hr tablet Take 1 tablet (150 mg total) by mouth every morning.   divalproex  (DEPAKOTE ) 250 MG DR tablet Take 1 tablet (250 mg total) by mouth daily.   furosemide  (LASIX ) 40 MG tablet Take 1 tablet (40 mg total) by mouth daily.   gabapentin  (NEURONTIN ) 300 MG capsule Take 300 mg by mouth 3 (three) times daily.   guaiFENesin  (MUCINEX ) 600 MG 12 hr tablet Take 1 tablet (600 mg total) by mouth 2 (two) times daily.   nicotine  (NICODERM CQ  - DOSED IN MG/24 HOURS) 14 mg/24hr patch Place 1 patch (14 mg total) onto the skin daily.   ondansetron  (ZOFRAN ) 4 MG tablet Take 1 tablet (4 mg total) by mouth every 4 (four) hours as needed for nausea or vomiting.   OXYGEN  Inhale 4 L into the lungs continuous as needed (shortness of breath).   pantoprazole  (PROTONIX ) 40 MG tablet Take 1 tablet (40 mg total) by mouth daily.   Potassium Chloride  ER 20 MEQ TBCR Take 1 tablet (20 mEq total) by mouth daily. 1 tab daily by mouth--- take while taking Lasix /furosemide    promethazine  (PHENERGAN ) 25 MG tablet Take 25 mg by mouth every 6 (six) hours as needed.   QUEtiapine  (SEROQUEL ) 100 MG tablet Take 1 tablet (100 mg total) by mouth at bedtime.   senna-docusate (SENOKOT-S) 8.6-50 MG tablet Take 2 tablets by mouth daily.                     Past Medical History:  Diagnosis Date   Acid reflux    CAD (coronary artery disease)    S/P cath with stent in 2001   CHF (congestive  heart failure) (HCC)    Collagen vascular disease (HCC)    COPD (chronic obstructive pulmonary disease) (HCC)    CVA (cerebral vascular accident) (HCC) 1992   DU (duodenal ulcer) 09/19/2009   Hiatal hernia    EGD september 2007. normal except for small hiatal hernia. She underwent small bowel biopsy with history of diarrhea at that time. This was negative   History of colonoscopy    August 2007. She had a pedunculated polyp at 30 cm. which was hamartomatous polyp. She  is due for 5-year followup with family history of coln cancer in a brother at age 52   History of colonoscopy    2005 revealed a linear ulcer with scar versus inflammatory process at the mid right colon, but normal terminal ileum. A biopsy of this area revealed an ulceration, but nonspecific   History of coronary angiogram    CT angiogram in 2007. negative with normal mesenteric arteries   History of kidney stones    Migraine    Pulmonary embolus (HCC) 1999   Ulcerative colitis (HCC)  Urinary frequency       Objective:    Wts  02/12/2024        122  10/22/2023        126   09/10/23 127 lb (57.6 kg)  09/03/23 129 lb 4.8 oz (58.7 kg)  04/21/23 119 lb (54 kg)    Vital signs reviewed  02/12/2024  - Note at rest 02 sats  98% on 3lpm continous  General appearance:    amb elderly wf nad appears much older than stated age     HEENT : Oropharynx  clear/edentulous    Nasal turbinates nl    NECK :  without  apparent JVD/ palpable Nodes/TM    LUNGS: no acc muscle use,  Min barrel  contour chest wall with decreased bs esp R base  s audible wheeze and  without cough on insp or exp maneuvers and min  Hyperresonant  to  percussion bilaterally    CV:  RRR  no s3 or murmur or increase in P2, and no edema   ABD:  soft and nontender with pos end  insp Hoover's  in the supine position.  No bruits or organomegaly appreciated   MS:  Nl gait/ ext warm without deformities Or obvious joint restrictions  calf tenderness, cyanosis or  clubbing     SKIN: warm and dry without lesions    NEURO:  alert, approp, nl sensorium with  no motor or cerebellar deficits apparent.            Cxr requested 02/12/2024 but did not go for study>  placed as phone note request she return       Assessment   Assessment & Plan COPD mixed type [facility]) Active smoker PFT's  10/05/19 FEV1  2.10  ( 85% )  ratio 0.77  p 2 % improvement from saba p 0 prior to study with DLCO  14.86 (76%) corrects to 3.42 (80%)  for alv volume and FV curve min concavity   - 07/10/2021 try symbicort  80 2bid and max gerd rx  - 09/10/2023 flutter valve training  - 10/22/2023  After extensive coaching inhaler device,  effectiveness =    75% (short Ti) >>> continue symbicort  80  2 bid   She has improved from her admit but not back to baseline and needs f/u cxr if she'll return for  this but no need to change main or prn rx   >>> for now continue breztri / requested to return for cxr  Chronic respiratory failure with hypoxia (HCC) Placed on 24 h 02 as of admit 11/17/23 - HC03  12/22/23  = 30 - as of 02/12/2024 on 3lpm cont 24/7   Advised: Make sure you check your oxygen  saturation  AT  your highest level of activity (not after you stop)   to be sure it stays over 90% and adjust  02 flow upward to maintain this level if needed but remember to turn it back to previous settings when you stop (to conserve your supply).  Acute pulmonary embolism without acute cor pulmonale, unspecified pulmonary embolism type (HCC) Acute RUL PE: No evidence of RV strain radiographically or on echo( see admit 11/17/23)   -- LE venous U/S without DVT. - Continue DOAC  for this 2nd VTE for pt (first was in 90's, was on coumadin for months) - Discharged on Eliquis  most likely lifelong anticoagulation  >>> agree will need lifelong DOAC   Discussed in detail all the  indications, usual  risks and alternatives  relative to the benefits with patient who agrees to proceed with Rx as outlined.      Cigarette smoker Counseled re importance of smoking cessation but did not meet time criteria for separate billing    - cautioned re use of cigarettes around 02       Each maintenance medication was reviewed in detail including emphasizing most importantly the difference between maintenance and prns and under what circumstances the prns are to be triggered using an action plan format where appropriate.  Total time for H and P, chart review, counseling, reviewing hfa/ 02 /pusle ox / neb  device(s) and generating customized AVS unique to this office visit / same day charting = 40 min post hosp f/u /new onset chronic resp failure          Patient Instructions  No change in medications for now  Make sure you check your oxygen  saturation at your highest level of activity(NOT after you stop)  to be sure it stays over 90% and keep track of it at least once a week, more often if breathing getting worse, and let me know if losing ground. (Collect the dots to connect the dots approach)    Please remember to go to the  x-ray department  @  [facility] for your tests - we will call you with the results when they are available      Please schedule a follow up office visit in 6 weeks, call sooner if needed    Ozell America, MD 02/14/2024

## 2024-02-13 NOTE — Assessment & Plan Note (Addendum)
 Active smoker PFT's  10/05/19 FEV1  2.10  ( 85% )  ratio 0.77  p 2 % improvement from saba p 0 prior to study with DLCO  14.86 (76%) corrects to 3.42 (80%)  for alv volume and FV curve min concavity   - 07/10/2021 try symbicort  80 2bid and max gerd rx  - 09/10/2023 flutter valve training  - 10/22/2023  After extensive coaching inhaler device,  effectiveness =    75% (short Ti) >>> continue symbicort  80  2 bid   She has improved from her admit but not back to baseline and needs f/u cxr if she'll return for  this but no need to change main or prn rx   >>> for now continue breztri / requested to return for cxr

## 2024-02-14 DIAGNOSIS — J9611 Chronic respiratory failure with hypoxia: Secondary | ICD-10-CM | POA: Insufficient documentation

## 2024-02-14 DIAGNOSIS — I2699 Other pulmonary embolism without acute cor pulmonale: Secondary | ICD-10-CM | POA: Insufficient documentation

## 2024-02-14 NOTE — Assessment & Plan Note (Addendum)
 Acute RUL PE: No evidence of RV strain radiographically or on echo( see admit 11/17/23)   -- LE venous U/S without DVT. - Continue DOAC  for this 2nd VTE for pt (first was in 90's, was on coumadin for months) - Discharged on Eliquis  most likely lifelong anticoagulation  >>> agree will need lifelong DOAC   Discussed in detail all the  indications, usual  risks and alternatives  relative to the benefits with patient who agrees to proceed with Rx as outlined.

## 2024-02-14 NOTE — Assessment & Plan Note (Addendum)
 Placed on 24 h 02 as of admit 11/17/23 - HC03  12/22/23  = 30 - as of 02/12/2024 on 3lpm cont 24/7   Advised: Make sure you check your oxygen  saturation  AT  your highest level of activity (not after you stop)   to be sure it stays over 90% and adjust  02 flow upward to maintain this level if needed but remember to turn it back to previous settings when you stop (to conserve your supply).

## 2024-02-14 NOTE — Assessment & Plan Note (Addendum)
 Counseled re importance of smoking cessation but did not meet time criteria for separate billing    - cautioned re use of cigarettes around 02       Each maintenance medication was reviewed in detail including emphasizing most importantly the difference between maintenance and prns and under what circumstances the prns are to be triggered using an action plan format where appropriate.  Total time for H and P, chart review, counseling, reviewing hfa/ 02 /pusle ox / neb  device(s) and generating customized AVS unique to this office visit / same day charting = 40 min post hosp f/u /new onset chronic resp failure

## 2024-02-16 NOTE — Telephone Encounter (Signed)
 Atc x1 left detailed message regarding pt NEEDING to get her cxr to completed her w/u. Can not move forward until done.

## 2024-02-17 NOTE — Telephone Encounter (Signed)
PT in ED.

## 2024-02-21 DIAGNOSIS — J441 Chronic obstructive pulmonary disease with (acute) exacerbation: Secondary | ICD-10-CM | POA: Diagnosis not present

## 2024-02-21 DIAGNOSIS — R0902 Hypoxemia: Secondary | ICD-10-CM | POA: Diagnosis not present

## 2024-02-23 DIAGNOSIS — I2510 Atherosclerotic heart disease of native coronary artery without angina pectoris: Secondary | ICD-10-CM | POA: Diagnosis not present

## 2024-02-23 DIAGNOSIS — M549 Dorsalgia, unspecified: Secondary | ICD-10-CM | POA: Diagnosis not present

## 2024-02-23 DIAGNOSIS — M519 Unspecified thoracic, thoracolumbar and lumbosacral intervertebral disc disorder: Secondary | ICD-10-CM | POA: Diagnosis not present

## 2024-02-23 DIAGNOSIS — G8918 Other acute postprocedural pain: Secondary | ICD-10-CM | POA: Diagnosis not present

## 2024-03-21 NOTE — Progress Notes (Deleted)
 [name] [name], female    DOB: July 21, 1960   MRN: [id number]   Brief patient profile:  2 yowf active smoker  referred to pulmonary clinic in [place]  07/10/2021 by Curtistine Medley PA for copd with symptoms starting around 2012  prev eval by Dr Theophilus with GOLD 0 criteria/ AB phenotype  but requested office closer to home     History of Present Illness  07/10/2021  Pulmonary/ 1st office eval/ [facility] [place] [facility]  on advair 250 Chief Complaint  Patient presents with   New Patient (Initial Visit)    Patient here for copd and emphysema. Ref by cardiologist. States she is using 4LO2 cont. At night all night and prn during day. Coughing up green/brown mucus.   Dyspnea:  [employer] shopping / no HC parking / some limited by neck from houswork Cough: every night at hs after lie down and also while asleep x one year / augmentin  best also helps nasal congestion/ coughs so hard gets fainty Sleep: bed is flat with pillows x 40 degrees with overt hb / on 02  SABA use: twice daily hfa/ neb once every 2 weeks Rec Plan A = Automatic = Always=    Symbicort  80 Take 2 puffs first thing in am and then another 2 puffs about 12 hours later.  Work on inhaler technique:  Plan B = Backup (to supplement plan A, not to replace it) Only use your albuterol  inhaler as a rescue medication Plan C = Crisis (instead of Plan B but only if Plan B stops working) - only use your albuterol  nebulizer if you first try Plan B  For cough /congestion > mucinex  dm 1200 mg twice daily = 2400 mg total per 24 hours Prednisone  10 mg take  4 each am x 2 days,   2 each am x 2 days,  1 each am x 2 days and stop  Augmentin  875 mg take one pill twice daily  X 10 days  Pantoprazole  (protonix ) 40 mg   Take  30-60 min before first meal of the day and Pepcid  (famotidine )  20 mg after supper until return to office  GERD diet reviewed, bed blocks rec  The key is to stop smoking completely before smoking completely stops you!      09/10/2023  f/u ov/[place] office/Ji Fairburn re: AB maint on symb 80  did not  bring meds / did not return as requested, and now needing clearance for neck surgery which she was forced to cancel due to resp cc / still smoking but cutting dwon  Chief Complaint  Patient presents with   Medical Clearance    Pt is here for surgery clearance , she also is having coughing congestion  she is taking doxcy. 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 Thick/ assoc with nasal congestion same mucus  Sleeping: bed with bunch of pillows or recliner x months   resp cc  SABA use: puff sev per day  02: prn  Rec Plan A = Automatic = Always=    Symbicort  80 (or breztri )  Take 2 puffs first thing in am and then another 2 puffs about 12 hours later.  Work on inhaler technique:   Plan B = Backup (to supplement plan A, not to replace it) Only use your albuterol  inhaler as a rescue medication Plan C = Crisis (instead of Plan B but only if Plan B stops working) - only  use your albuterol  nebulizer if you first try Plan B and it fails to help > For cough/ congestion > mucinex  or mucinex  dm  up to maximum of  1200 mg every 12 hours and use the flutter valve as much as you can   Prednisone  10 mg take  4 each am x 2 days,   2 each am x 2 days,  1 each am x 2 days and stop  Augmentin  875 mg take one pill twice daily  X 10 days  Pantoprazole  (protonix ) 40 mg   Take  30-60 min before first meal of the day and Pepcid  (famotidine )  20 mg after supper until return to office GERD diet reviewed, bed blocks rec   The key is to stop smoking completely before smoking completely stops you!     10/22/2023  f/u ov/[place] office/Ermal Brzozowski re: AB maint on Symbicort  80 did  bring meds - was much better transiently p last round of pred and augmentin  x one week then worse again/ no change active smoking  Chief Complaint  Patient presents with   Results   COPD   Dyspnea:  pushing buggy at  [employer]  [employer] vs peers Cough: worse am thick gey Sleeping: flat bed propped up on a bunch of pillows s  resp cc  SABA use: twice a day albuterol  hfa  02: concentrator is broken Rec For cough/ congestion > mucinex  or mucinex  dm  up to maximum of  1200 mg every 12 hours and use the flutter valve as much as you can   Once you know the date of surgery, go ahead and start these at least 7 days pre op. Prednisone  10 mg Take 4 for two days three for two days two for two days one for two days  Augmentin  875 mg take one pill twice daily  X 10 days Pantoprazole  (protonix ) 40 mg   Take  30-60 min before first meal of the day and Pepcid  (famotidine )  20 mg after supper until return to office  The key is to stop smoking completely before smoking completely stops you!  Please schedule a follow up visit in 3 months but call sooner if needed    Admission date:  11/17/2023   Discharge Date:  11/26/2023  Recommendations for primary care physician for things to follow: Admission Diagnosis  Pneumonia, organism unspecified(486) [J18.9]  Discharge Diagnosis  Pneumonia, organism unspecified(486) [J18.9]     Pneumonia, organism unspecified(486)   COPD with acute exacerbation (HCC)   Acute on chronic hypoxic respiratory failure (HCC)   GERD (gastroesophageal reflux disease)   Tobacco use disorder   Community acquired pneumonia   Substance abuse (HCC)   Opioid dependence on agonist therapy (HCC)     HPI  from the history and physical done on the day of admission:    HPI: [name] [name] is a 63 y.o. female with medical history significant for ongoing tobacco abuse, COPD with chronic hypoxic respiratory failure on 3 L of oxygen  via nasal cannula PTA, prior history of pulmonary embolism, CAD with prior angioplasty and stent placement in 2001, GERD with prior history of duodenal ulcers, and prior history of CVA presents to the ED with worsening shortness of breath and worsening hypoxia. - Over the last 4 to 5  days PTA patient has had episodes of recurrent emesis, she later developed right-sided chest wall and rib cage as well as anterior abdominal wall and flank pain presumably from excessive vomiting and retching. - Emesis  was without bile or blood - No diarrhea - Cough becoming more productive In ED chest x-ray shows--IMPRESSION: *Heterogeneous opacities at the bilateral lung bases, slightly more pronounced than the prior exam. Findings may represent  possible pneumonitis. Troponin 5 >>4, EKG shows sinus tachycardia without acute changes - SOdium 134 potassium 3.4 bicarb 28 creatinine 0.84 LFTs are not elevated - WBC 17.3, Hgb 10.9 and platelets 311 - Flu COVID and RSV were negative on 11/14/2023   Hospital Course:    1)Acute on chronic hypoxic respiratory failure: Due to combination of pneumonia and PE--- - Much improved overall - Currently back to 2 L of oxygen  via nasal cannula from 5 L -Post ambulation O2 sats on 3 L of oxygen  acceptable--- please see nursing documentation   2)Acute RUL PE: No evidence of RV strain radiographically or on echo,  -- LE venous U/S without DVT. - Continue DOAC (30-day copay is $12.15). 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   - Note +rhinovirus, droplet precautions.  -Much improved clinically after interventions as above   4) acute COPD exacerbation--due to #3 above -treated with Solu-Medrol  -Overall much improved -Discharge on p.o. prednisone  mucolytics and bronchodilators   5)Tobacco use:  Smoking cessation counseling for 4 minutes today,  Give Nicotine  patch I  6)GERD--- continue Protonix    7)Chronic pain:  - Continue buprenorphine  and gabapentin     02/12/2024  ACUTE  ov/[place] office/Mayline Dragon re: AB/02 dep  maint on Breztri     still  smoker  Chief  Complaint  Patient presents with   COPD    81m f/u  DOE :  not back at [employer] yet/ newly now @ 3lpm 02 24/7 Cough: am x sev hours > mucoid only  Sleeping: bed bed/ lots of pillows  s  resp cc  SABA use: rare hfa/ never neb 02: 3lpm 24/7 cont - not titrating  Rec     03/25/2024  f/u ov/[place] office/Madisyn Mawhinney re:  AB/02 dep maint on ***   ***  smoker  No chief complaint on file.   Dyspnea:  *** Cough: *** Sleeping: ***   resp cc  SABA use: *** 02: ***  Lung cancer screening: ***   No obvious day to day or daytime variability or assoc excess/ purulent sputum or mucus plugs or hemoptysis or cp or chest tightness, subjective wheeze or overt sinus or hb symptoms.    Also denies any obvious fluctuation of symptoms with weather or environmental changes or other aggravating or alleviating factors except as outlined above   No unusual exposure hx or h/o childhood pna/ asthma or knowledge of premature birth.  Current Allergies, Complete Past Medical History, Past Surgical History, Family History, and Social History were reviewed in [employer] record.  ROS  The following are not active complaints unless bolded Hoarseness, sore throat, dysphagia, dental problems, itching, sneezing,  nasal congestion or discharge of excess mucus or purulent secretions, ear ache,   fever, chills, sweats, unintended wt loss or wt gain, classically pleuritic or exertional cp,  orthopnea pnd or arm/hand swelling  or leg swelling, presyncope, palpitations, abdominal pain, anorexia, nausea, vomiting, diarrhea  or change in bowel habits or change in bladder habits, change in stools or change in urine, dysuria, hematuria,  rash, arthralgias, visual complaints, headache, numbness, weakness or ataxia or problems  with walking or coordination,  change in mood or  memory.        No outpatient medications have been marked as taking for the 03/25/24 encounter (Appointment) with Darlean Ozell NOVAK, MD.               Past Medical History:  Diagnosis Date   Acid reflux    CAD (coronary artery disease)    S/P cath with stent in 2001   CHF (congestive heart failure) (HCC)    Collagen vascular disease (HCC)    COPD (chronic obstructive pulmonary disease) (HCC)    CVA (cerebral vascular accident) (HCC) 1992   DU (duodenal ulcer) 09/19/2009   Hiatal hernia    EGD september 2007. normal except for small hiatal hernia. She underwent small bowel biopsy with history of diarrhea at that time. This was negative   History of colonoscopy    August 2007. She had a pedunculated polyp at 30 cm. which was hamartomatous polyp. She  is due for 5-year followup with family history of coln cancer in a brother at age 11   History of colonoscopy    2005 revealed a linear ulcer with scar versus inflammatory process at the mid right colon, but normal terminal ileum. A biopsy of this area revealed an ulceration, but nonspecific   History of coronary angiogram    CT angiogram in 2007. negative with normal mesenteric arteries   History of kidney stones    Migraine    Pulmonary embolus (HCC) 1999   Ulcerative colitis (HCC)    Urinary frequency       Objective:    Wts  03/25/2024         ***  02/12/2024        122  10/22/2023        126   09/10/23 127 lb (57.6 kg)  09/03/23 129 lb 4.8 oz (58.7 kg)  04/21/23 119 lb (54 kg)      Vital signs reviewed  03/25/2024  - Note at rest 02 sats  ***% on ***   General appearance:    ***  edentulous  Min barr***          Cxr requested 02/12/2024 but did not go for study>  placed as phone note request she return       Assessment

## 2024-03-22 DIAGNOSIS — G8929 Other chronic pain: Secondary | ICD-10-CM | POA: Diagnosis not present

## 2024-03-23 DIAGNOSIS — J441 Chronic obstructive pulmonary disease with (acute) exacerbation: Secondary | ICD-10-CM | POA: Diagnosis not present

## 2024-03-25 ENCOUNTER — Ambulatory Visit: Admitting: Internal Medicine

## 2024-04-12 DIAGNOSIS — R1110 Vomiting, unspecified: Secondary | ICD-10-CM | POA: Diagnosis not present

## 2024-04-12 DIAGNOSIS — Z6820 Body mass index (BMI) 20.0-20.9, adult: Secondary | ICD-10-CM | POA: Diagnosis not present

## 2024-04-12 DIAGNOSIS — R062 Wheezing: Secondary | ICD-10-CM | POA: Diagnosis not present

## 2024-04-14 ENCOUNTER — Encounter (INDEPENDENT_AMBULATORY_CARE_PROVIDER_SITE_OTHER): Payer: Self-pay | Admitting: Gastroenterology

## 2024-04-15 ENCOUNTER — Ambulatory Visit: Admitting: Internal Medicine

## 2024-04-22 DIAGNOSIS — R0902 Hypoxemia: Secondary | ICD-10-CM | POA: Diagnosis not present

## 2024-04-22 DIAGNOSIS — J441 Chronic obstructive pulmonary disease with (acute) exacerbation: Secondary | ICD-10-CM | POA: Diagnosis not present

## 2024-05-10 ENCOUNTER — Emergency Department (HOSPITAL_COMMUNITY)
Admission: EM | Admit: 2024-05-10 | Discharge: 2024-05-10 | Disposition: A | Discharge status: Home / Self Care | Attending: Emergency Medicine | Admitting: Emergency Medicine

## 2024-05-10 ENCOUNTER — Emergency Department (HOSPITAL_COMMUNITY)

## 2024-05-10 ENCOUNTER — Other Ambulatory Visit: Payer: Self-pay

## 2024-05-10 ENCOUNTER — Encounter (HOSPITAL_COMMUNITY): Payer: Self-pay

## 2024-05-10 DIAGNOSIS — M5442 Lumbago with sciatica, left side: Secondary | ICD-10-CM | POA: Diagnosis not present

## 2024-05-10 DIAGNOSIS — M5440 Lumbago with sciatica, unspecified side: Secondary | ICD-10-CM

## 2024-05-10 DIAGNOSIS — M545 Low back pain, unspecified: Secondary | ICD-10-CM | POA: Diagnosis present

## 2024-05-10 DIAGNOSIS — Z7901 Long term (current) use of anticoagulants: Secondary | ICD-10-CM | POA: Diagnosis not present

## 2024-05-10 MED ORDER — ONDANSETRON 4 MG PO TBDP
4.0000 mg | ORAL_TABLET | Freq: Once | ORAL | Status: AC
  Administered 2024-05-10: 4 mg via ORAL
  Filled 2024-05-10: qty 1

## 2024-05-10 MED ORDER — OXYCODONE-ACETAMINOPHEN 5-325 MG PO TABS: 1.0000 | ORAL_TABLET | Freq: Four times a day (QID) | ORAL | 0 refills | Status: AC | PRN

## 2024-05-10 MED ORDER — OXYCODONE-ACETAMINOPHEN 5-325 MG PO TABS
1.0000 | ORAL_TABLET | Freq: Once | ORAL | Status: AC
  Administered 2024-05-10: 1 via ORAL
  Filled 2024-05-10: qty 1

## 2024-05-10 MED ORDER — PREDNISONE 10 MG PO TABS: 20.0000 mg | ORAL_TABLET | Freq: Every day | ORAL | 0 refills | Status: AC

## 2024-05-10 MED ORDER — ONDANSETRON 4 MG PO TBDP
ORAL_TABLET | ORAL | 0 refills | Status: AC
Start: 2024-05-10 — End: ?

## 2024-05-10 MED ORDER — METHYLPREDNISOLONE SODIUM SUCC 125 MG IJ SOLR
125.0000 mg | Freq: Once | INTRAMUSCULAR | Status: AC
  Administered 2024-05-10: 125 mg via INTRAMUSCULAR
  Filled 2024-05-10: qty 2

## 2024-05-10 MED ORDER — HYDROMORPHONE HCL 1 MG/ML IJ SOLN
1.0000 mg | Freq: Once | INTRAMUSCULAR | Status: AC
Start: 2024-05-10 — End: 2024-05-10
  Administered 2024-05-10: 1 mg via INTRAMUSCULAR
  Filled 2024-05-10: qty 1

## 2024-05-10 NOTE — ED Triage Notes (Signed)
 Pt c/o left side back pain that radiates down left leg. Pt reports that she has had an infusion on her left neck that needs fixed and started Wednesday started experiencing the pain shoot down her left side. Pt states intermittent left hand numbness.

## 2024-05-10 NOTE — Discharge Instructions (Signed)
Follow-up with your neurosurgeon in the next couple weeks ?

## 2024-05-10 NOTE — ED Provider Notes (Signed)
[place] EMERGENCY DEPARTMENT AT [facility] Provider Note   CSN: [id number] Arrival date & time: 05/10/24  9147     Patient presents with: Back Pain   [name] [name] is a 63 y.o. female.  {Add pertinent medical, surgical, social history, OB history to YEP:67052} Patient complains of back pain radiating down her left leg.  She has a history of previous back surgery.   Back Pain      Prior to Admission medications   Medication Sig Start Date End Date Taking? Authorizing Provider  ondansetron  (ZOFRAN -ODT) 4 MG disintegrating tablet 4mg  ODT q4 hours prn nausea/vomit 05/10/24  Yes Gabrielle Mester, MD  oxyCODONE -acetaminophen  (PERCOCET/ROXICET) 5-325 MG tablet Take 1 tablet by mouth every 6 (six) hours as needed for severe pain (pain score 7-10). 05/10/24  Yes Suzette Pac, MD  predniSONE  (DELTASONE ) 10 MG tablet Take 2 tablets (20 mg total) by mouth daily. 05/10/24  Yes Suzette Pac, MD  acetaminophen  (TYLENOL ) 325 MG tablet Take 2 tablets (650 mg total) by mouth every 6 (six) hours as needed for mild pain (pain score 1-3) (or Fever >/= 101). 11/26/23   Pearlean Manus, MD  albuterol  (PROVENTIL ) (2.5 MG/3ML) 0.083% nebulizer solution Take 3 mLs (2.5 mg total) by nebulization every 4 (four) hours as needed for wheezing or shortness of breath. 11/26/23   Pearlean Manus, MD  albuterol  (VENTOLIN  HFA) 108 (90 Base) MCG/ACT inhaler Inhale 2 puffs into the lungs every 4 (four) hours as needed for shortness of breath or wheezing. 11/26/23   Pearlean Manus, MD  apixaban  (ELIQUIS ) 5 MG TABS tablet Take 1 tablet (5 mg total) by mouth 2 (two) times daily. 11/26/23   Pearlean Manus, MD  budesonide -glycopyrrolate-formoterol  (BREZTRI  AEROSPHERE) 160-9-4.8 MCG/ACT AERO inhaler Inhale 2 puffs into the lungs in the morning and at bedtime. 11/26/23   Pearlean Manus, MD  buprenorphine  (SUBUTEX ) 8 MG SUBL SL tablet Place 8 mg under the tongue in the morning and at bedtime. 08/18/23    [provider]  buPROPion  (WELLBUTRIN  SR) 150 MG 12 hr tablet Take 1 tablet (150 mg total) by mouth every morning. 11/26/23   Pearlean Manus, MD  divalproex  (DEPAKOTE ) 250 MG DR tablet Take 1 tablet (250 mg total) by mouth daily. 11/26/23   Pearlean Manus, MD  furosemide  (LASIX ) 40 MG tablet Take 1 tablet (40 mg total) by mouth daily. 11/26/23 11/25/24  Pearlean Manus, MD  gabapentin  (NEURONTIN ) 300 MG capsule Take 300 mg by mouth 3 (three) times daily. 10/12/20   [provider]  guaiFENesin  (MUCINEX ) 600 MG 12 hr tablet Take 1 tablet (600 mg total) by mouth 2 (two) times daily. 11/26/23 11/25/24  Pearlean Manus, MD  nicotine  (NICODERM CQ  - DOSED IN MG/24 HOURS) 14 mg/24hr patch Place 1 patch (14 mg total) onto the skin daily. 11/27/23   Pearlean Manus, MD  ondansetron  (ZOFRAN ) 4 MG tablet Take 1 tablet (4 mg total) by mouth every 4 (four) hours as needed for nausea or vomiting. 11/14/23   Elnor Jayson LABOR, DO  OXYGEN  Inhale 4 L into the lungs continuous as needed (shortness of breath).    [provider]  pantoprazole  (PROTONIX ) 40 MG tablet Take 1 tablet (40 mg total) by mouth daily. 11/26/23   Pearlean Manus, MD  Potassium Chloride  ER 20 MEQ TBCR Take 1 tablet (20 mEq total) by mouth daily. 1 tab daily by mouth--- take while taking Lasix /furosemide  11/26/23   Pearlean Manus, MD  promethazine  (PHENERGAN ) 25 MG tablet Take 25 mg by  mouth every 6 (six) hours as needed. 08/20/23   [provider]  QUEtiapine  (SEROQUEL ) 100 MG tablet Take 1 tablet (100 mg total) by mouth at bedtime. 11/26/23   Pearlean Manus, MD  senna-docusate (SENOKOT-S) 8.6-50 MG tablet Take 2 tablets by mouth daily. 11/27/23   Pearlean Manus, MD    Allergies: Codeine , Hydrocodone, Sulfa antibiotics, and Hydrocodone-acetaminophen     Review of Systems  Musculoskeletal:  Positive for back pain.    Updated Vital Signs BP 110/75   Pulse 64   Temp 97.8 F (36.6 C) (Oral)   Resp 20   Ht  5' 4 (1.626 m)   Wt 55.8 kg   SpO2 98%   BMI 21.11 kg/m   Physical Exam  (all labs ordered are listed, but only abnormal results are displayed) Labs Reviewed - No data to display  EKG: None  Radiology: DG Lumbar Spine Complete Result Date: 05/10/2024 EXAM: 4 VIEW(S) XRAY OF THE LUMBAR SPINE 05/10/2024 10:00:00 AM COMPARISON: None available. CLINICAL HISTORY: pain pain FINDINGS: LUMBAR SPINE: BONES: 5 non rib bearing lumbar type vertebral bodies. Subtle lucency throughout the right L5 pars interarticularis. No aggressive appearing osseous lesion. No spondylolisthesis. DISCS AND DEGENERATIVE CHANGES: Moderate intervertebral disc height loss throughout the lumbar spine with large multilevel osteophytes. SOFT TISSUES: Diffuse aortic atherosclerosis. Cholecystectomy clips. IMPRESSION: 1. Subtle lucency in the right L5 pars interarticularis. While this could represent artifact from overlying fecal material in the colon, an age indeterminate, nondisplaced fracture could also have this appearance. No spondylolisthesis. 2. 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 Electronically signed by: Rogelia Myers MD 05/10/2024 11:21 AM EST RP Workstation: [id number]    {Document cardiac monitor, telemetry assessment procedure when appropriate:32947} Procedures   Medications Ordered in the ED  methylPREDNISolone  sodium succinate (SOLU-MEDROL ) 125 mg/2 mL injection 125 mg (has no administration in time range)  oxyCODONE -acetaminophen  (PERCOCET/ROXICET) 5-325 MG per tablet 1 tablet (has no administration in time range)  HYDROmorphone  (DILAUDID ) injection 1 mg (1 mg Intramuscular Given 05/10/24 1029)  ondansetron  (ZOFRAN -ODT) disintegrating tablet 4 mg (4 mg Oral Given 05/10/24 1029)   X-ray shows degenerative changes.   {Click here for ABCD2, HEART and other calculators REFRESH Note before signing:1}                              Medical Decision Making Amount and/or Complexity  of Data Reviewed Radiology: ordered.  Risk Prescription drug management.   Back pain with sciatica on the left side.  Patient started on Percocets, Zofran  and prednisone  and will follow-up with neurosurgery  {Document critical care time when appropriate  Document review of labs and clinical decision tools ie [id number], etc  Document your independent review of radiology images and any outside records  Document your discussion with family members, caretakers and with consultants  Document social determinants of health affecting pt's care  Document your decision making why or why not admission, treatments were needed:32947:::1}   Final diagnoses:  Acute left-sided low back pain with sciatica, sciatica laterality unspecified    ED Discharge Orders          Ordered    predniSONE  (DELTASONE ) 10 MG tablet  Daily        05/10/24 1207    oxyCODONE -acetaminophen  (PERCOCET/ROXICET) 5-325 MG tablet  Every 6 hours PRN        05/10/24 1207    ondansetron  (ZOFRAN -ODT) 4 MG disintegrating tablet  05/10/24 1207             

## 2024-05-13 ENCOUNTER — Ambulatory Visit: Admitting: Internal Medicine

## 2024-05-13 NOTE — Progress Notes (Deleted)
 JARITZA DUIGNAN, female    DOB: 08/25/1960   MRN: [id number]   Brief patient profile:  17 yowf active smoker  referred to pulmonary clinic in [place]  07/10/2021 by Curtistine Medley PA for copd with symptoms starting around 2012  prev eval by Dr Theophilus with GOLD 0 criteria/ AB phenotype  but requested office closer to home     History of Present Illness  07/10/2021  Pulmonary/ 1st office eval/ Zen[facility] Kercheval / [place] [facility]  on advair 250 Chief Complaint  Patient presents with   New Patient (Initial Visit)    Patient here for copd and emphysema. Ref by cardiologist. States she is using 4LO2 cont. At night all night and prn during day. Coughing up green/brown mucus.   Dyspnea:  [employer] shopping / no HC parking / some limited by neck from houswork Cough: every night at hs after lie down and also while asleep x one year / augmentin  best also helps nasal congestion/ coughs so hard gets fainty Sleep: bed is flat with pillows x 40 degrees with overt hb / on 02  SABA use: twice daily hfa/ neb once every 2 weeks Rec Plan A = Automatic = Always=    Symbicort  80 Take 2 puffs first thing in am and then another 2 puffs about 12 hours later.  Work on inhaler technique:  Plan B = Backup (to supplement plan A, not to replace it) Only use your albuterol  inhaler as a rescue medication Plan C = Crisis (instead of Plan B but only if Plan B stops working) - only use your albuterol  nebulizer if you first try Plan B  For cough /congestion > mucinex  dm 1200 mg twice daily = 2400 mg total per 24 hours Prednisone  10 mg take  4 each am x 2 days,   2 each am x 2 days,  1 each am x 2 days and stop  Augmentin  875 mg take one pill twice daily  X 10 days  Pantoprazole  (protonix ) 40 mg   Take  30-60 min before first meal of the day and Pepcid  (famotidine )  20 mg after supper until return to office  GERD diet reviewed, bed blocks rec  The key is to stop smoking completely before smoking completely stops you!      09/10/2023  f/u ov/[place] office/Ammon Muscatello re: AB maint on symb 80  did not  bring meds / did not return as requested, and now needing clearance for neck surgery which she was forced to cancel due to resp cc / still smoking but cutting dwon  Chief Complaint  Patient presents with   Medical Clearance    Pt is here for surgery clearance , she also is having coughing congestion  she is taking doxcy. 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 Thick/ assoc with nasal congestion same mucus  Sleeping: bed with bunch of pillows or recliner x months   resp cc  SABA use: puff sev per day  02: prn  Rec Plan A = Automatic = Always=    Symbicort  80 (or breztri )  Take 2 puffs first thing in am and then another 2 puffs about 12 hours later.  Work on inhaler technique:   Plan B = Backup (to supplement plan A, not to replace it) Only use your albuterol  inhaler as a rescue medication Plan C = Crisis (instead of Plan B but only if Plan B stops working) - only  use your albuterol  nebulizer if you first try Plan B and it fails to help > For cough/ congestion > mucinex  or mucinex  dm  up to maximum of  1200 mg every 12 hours and use the flutter valve as much as you can   Prednisone  10 mg take  4 each am x 2 days,   2 each am x 2 days,  1 each am x 2 days and stop  Augmentin  875 mg take one pill twice daily  X 10 days  Pantoprazole  (protonix ) 40 mg   Take  30-60 min before first meal of the day and Pepcid  (famotidine )  20 mg after supper until return to office GERD diet reviewed, bed blocks rec   The key is to stop smoking completely before smoking completely stops you!     10/22/2023  f/u ov/[place] office/Sidney Kann re: AB maint on Symbicort  80 did  bring meds - was much better transiently p last round of pred and augmentin  x one week then worse again/ no change active smoking  Chief Complaint  Patient presents with   Results   COPD   Dyspnea:  pushing buggy at  [employer]  [employer] vs peers Cough: worse am thick gey Sleeping: flat bed propped up on a bunch of pillows s  resp cc  SABA use: twice a day albuterol  hfa  02: concentrator is broken Rec For cough/ congestion > mucinex  or mucinex  dm  up to maximum of  1200 mg every 12 hours and use the flutter valve as much as you can   Once you know the date of surgery, go ahead and start these at least 7 days pre op. Prednisone  10 mg Take 4 for two days three for two days two for two days one for two days  Augmentin  875 mg take one pill twice daily  X 10 days Pantoprazole  (protonix ) 40 mg   Take  30-60 min before first meal of the day and Pepcid  (famotidine )  20 mg after supper until return to office  The key is to stop smoking completely before smoking completely stops you!  Please schedule a follow up visit in 3 months but call sooner if needed    Admission date:  11/17/2023   Discharge Date:  11/26/2023  Recommendations for primary care physician for things to follow: Admission Diagnosis  Pneumonia, organism unspecified(486) [J18.9]  Discharge Diagnosis  Pneumonia, organism unspecified(486) [J18.9]     Pneumonia, organism unspecified(486)   COPD with acute exacerbation (HCC)   Acute on chronic hypoxic respiratory failure (HCC)   GERD (gastroesophageal reflux disease)   Tobacco use disorder   Community acquired pneumonia   Substance abuse (HCC)   Opioid dependence on agonist therapy (HCC)     HPI  from the history and physical done on the day of admission:    HPI: GRACELYNNE BENEDICT is a 63 y.o. female with medical history significant for ongoing tobacco abuse, COPD with chronic hypoxic respiratory failure on 3 L of oxygen  via nasal cannula PTA, prior history of pulmonary embolism, CAD with prior angioplasty and stent placement in 2001, GERD with prior history of duodenal ulcers, and prior history of CVA presents to the ED with worsening shortness of breath and worsening hypoxia. - Over the last 4 to 5  days PTA patient has had episodes of recurrent emesis, she later developed right-sided chest wall and rib cage as well as anterior abdominal wall and flank pain presumably from excessive vomiting and retching. - Emesis  was without bile or blood - No diarrhea - Cough becoming more productive In ED chest x-ray shows--IMPRESSION: *Heterogeneous opacities at the bilateral lung bases, slightly more pronounced than the prior exam. Findings may represent  possible pneumonitis. Troponin 5 >>4, EKG shows sinus tachycardia without acute changes - SOdium 134 potassium 3.4 bicarb 28 creatinine 0.84 LFTs are not elevated - WBC 17.3, Hgb 10.9 and platelets 311 - Flu COVID and RSV were negative on 11/14/2023   Hospital Course:    1)Acute on chronic hypoxic respiratory failure: Due to combination of pneumonia and PE--- - Much improved overall - Currently back to 2 L of oxygen  via nasal cannula from 5 L -Post ambulation O2 sats on 3 L of oxygen  acceptable--- please see nursing documentation   2)Acute RUL PE: No evidence of RV strain radiographically or on echo,  -- LE venous U/S without DVT. - Continue DOAC (30-day copay is $12.15). 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   - Note +rhinovirus, droplet precautions.  -Much improved clinically after interventions as above   4) acute COPD exacerbation--due to #3 above -treated with Solu-Medrol  -Overall much improved -Discharge on p.o. prednisone  mucolytics and bronchodilators   5)Tobacco use:  Smoking cessation counseling for 4 minutes today,  Give Nicotine  patch I  6)GERD--- continue Protonix    7)Chronic pain:  - Continue buprenorphine  and gabapentin     02/12/2024  ACUTE  ov/[place] office/Keonta Monceaux re: AB/02 dep  maint on Breztri     still  smoker  Chief  Complaint  Patient presents with   COPD    77m f/u  DOE :  not back at [employer] yet/ newly now @ 3lpm 02 24/7 Cough: am x sev hours > mucoid only  Sleeping: bed bed/ lots of pillows  s  resp cc  SABA use: rare hfa/ never neb 02: 3lpm 24/7 cont - not titrating  Patient Instructions  No change in medications for now Make sure you check your oxygen  saturation at your highest level of activity(NOT after you stop)  to be sure it stays over 90%   Please remember to go to the  x-ray department did not go as of 05/13/2024    05/13/2024  f/u ov/[place] office/Sagan Wurzel re: *** maint on ***  needs cxr ***  No chief complaint on file.   Dyspnea:  *** Cough: *** Sleeping: ***   resp cc  SABA use: *** 02: ***  Lung cancer screening: ***   No obvious day to day or daytime variability or assoc excess/ purulent sputum or mucus plugs or hemoptysis or cp or chest tightness, subjective wheeze or overt sinus or hb symptoms.    Also denies any obvious fluctuation of symptoms with weather or environmental changes or other aggravating or alleviating factors except as outlined above   No unusual exposure hx or h/o childhood pna/ asthma or knowledge of premature birth.  Current Allergies, Complete Past Medical History, Past Surgical History, Family History, and Social History were reviewed in [employer] record.  ROS  The following are not active complaints unless bolded Hoarseness, sore throat, dysphagia, dental problems, itching, sneezing,  nasal congestion or discharge of excess mucus or purulent secretions, ear ache,   fever, chills, sweats, unintended wt loss or wt gain, classically pleuritic or exertional cp,  orthopnea pnd or arm/hand  swelling  or leg swelling, presyncope, palpitations, abdominal pain, anorexia, nausea, vomiting, diarrhea  or change in bowel habits or change in bladder habits, change in stools or change in urine, dysuria, hematuria,  rash, arthralgias, visual  complaints, headache, numbness, weakness or ataxia or problems with walking or coordination,  change in mood or  memory.        No outpatient medications have been marked as taking for the 05/13/24 encounter (Appointment) with Darlean Ozell NOVAK, MD.                         Past Medical History:  Diagnosis Date   Acid reflux    CAD (coronary artery disease)    S/P cath with stent in 2001   CHF (congestive heart failure) (HCC)    Collagen vascular disease (HCC)    COPD (chronic obstructive pulmonary disease) (HCC)    CVA (cerebral vascular accident) (HCC) 1992   DU (duodenal ulcer) 09/19/2009   Hiatal hernia    EGD september 2007. normal except for small hiatal hernia. She underwent small bowel biopsy with history of diarrhea at that time. This was negative   History of colonoscopy    August 2007. She had a pedunculated polyp at 30 cm. which was hamartomatous polyp. She  is due for 5-year followup with family history of coln cancer in a brother at age 6   History of colonoscopy    2005 revealed a linear ulcer with scar versus inflammatory process at the mid right colon, but normal terminal ileum. A biopsy of this area revealed an ulceration, but nonspecific   History of coronary angiogram    CT angiogram in 2007. negative with normal mesenteric arteries   History of kidney stones    Migraine    Pulmonary embolus (HCC) 1999   Ulcerative colitis (HCC)    Urinary frequency       Objective:    Wts  05/13/2024        ***  02/12/2024        122  10/22/2023        126   09/10/23 127 lb (57.6 kg)  09/03/23 129 lb 4.8 oz (58.7 kg)  04/21/23 119 lb (54 kg)     Vital signs reviewed  05/13/2024  - Note at rest 02 sats  ***% on ***   General appearance:    ***   edentulous ***,  Min bar***       Cxr requested 02/12/2024 but did not go for study>  placed as phone note request she return       Assessment
